# Patient Record
Sex: Female | Born: 1970 | Race: Black or African American | Hispanic: No | Marital: Single | Smoking: Former smoker
Health system: Southern US, Community
[De-identification: ages and names within clinical notes are randomized; demographics above are authoritative.]

## PROBLEM LIST (undated history)

## (undated) ENCOUNTER — Emergency Department (HOSPITAL_COMMUNITY): Admission: EM | Payer: Commercial Managed Care - HMO | Source: Home / Self Care

## (undated) DIAGNOSIS — F419 Anxiety disorder, unspecified: Secondary | ICD-10-CM

## (undated) DIAGNOSIS — D219 Benign neoplasm of connective and other soft tissue, unspecified: Secondary | ICD-10-CM

## (undated) DIAGNOSIS — R51 Headache: Secondary | ICD-10-CM

## (undated) DIAGNOSIS — B20 Human immunodeficiency virus [HIV] disease: Secondary | ICD-10-CM

## (undated) DIAGNOSIS — D649 Anemia, unspecified: Secondary | ICD-10-CM

## (undated) DIAGNOSIS — Z21 Asymptomatic human immunodeficiency virus [HIV] infection status: Secondary | ICD-10-CM

## (undated) DIAGNOSIS — F99 Mental disorder, not otherwise specified: Secondary | ICD-10-CM

## (undated) DIAGNOSIS — B009 Herpesviral infection, unspecified: Secondary | ICD-10-CM

## (undated) DIAGNOSIS — F32A Depression, unspecified: Secondary | ICD-10-CM

## (undated) DIAGNOSIS — B999 Unspecified infectious disease: Secondary | ICD-10-CM

## (undated) DIAGNOSIS — I10 Essential (primary) hypertension: Secondary | ICD-10-CM

## (undated) DIAGNOSIS — E785 Hyperlipidemia, unspecified: Secondary | ICD-10-CM

## (undated) HISTORY — DX: Hyperlipidemia, unspecified: E78.5

## (undated) HISTORY — PX: TUBAL LIGATION: SHX77

## (undated) HISTORY — PX: DILATION AND CURETTAGE OF UTERUS: SHX78

## (undated) HISTORY — DX: Herpesviral infection, unspecified: B00.9

## (undated) HISTORY — DX: Depression, unspecified: F32.A

## (undated) HISTORY — DX: Essential (primary) hypertension: I10

## (undated) HISTORY — PX: HAND TENDON SURGERY: SHX663

## (undated) HISTORY — DX: Mental disorder, not otherwise specified: F99

## (undated) HISTORY — PX: INDUCED ABORTION: SHX677

---

## 2010-03-09 ENCOUNTER — Emergency Department (HOSPITAL_BASED_OUTPATIENT_CLINIC_OR_DEPARTMENT_OTHER): Admission: EM | Admit: 2010-03-09 | Discharge: 2010-03-09 | Payer: Self-pay | Admitting: Emergency Medicine

## 2010-04-25 ENCOUNTER — Emergency Department (HOSPITAL_BASED_OUTPATIENT_CLINIC_OR_DEPARTMENT_OTHER): Admission: EM | Admit: 2010-04-25 | Discharge: 2010-04-25 | Payer: Self-pay | Admitting: Emergency Medicine

## 2010-11-13 LAB — URINALYSIS, ROUTINE W REFLEX MICROSCOPIC
Bilirubin Urine: NEGATIVE
Glucose, UA: NEGATIVE mg/dL
Hgb urine dipstick: NEGATIVE
Ketones, ur: NEGATIVE mg/dL
Nitrite: NEGATIVE
Protein, ur: NEGATIVE mg/dL
Specific Gravity, Urine: 1.031 — ABNORMAL HIGH (ref 1.005–1.030)
Urobilinogen, UA: 1 mg/dL (ref 0.0–1.0)
pH: 6 (ref 5.0–8.0)

## 2010-11-13 LAB — PREGNANCY, URINE: Preg Test, Ur: NEGATIVE

## 2013-08-14 ENCOUNTER — Encounter (HOSPITAL_COMMUNITY): Payer: Self-pay | Admitting: Emergency Medicine

## 2013-08-14 ENCOUNTER — Emergency Department (HOSPITAL_COMMUNITY)
Admission: EM | Admit: 2013-08-14 | Discharge: 2013-08-14 | Disposition: A | Discharge status: Home / Self Care | Payer: Medicaid - Out of State | Attending: Emergency Medicine | Admitting: Emergency Medicine

## 2013-08-14 DIAGNOSIS — R109 Unspecified abdominal pain: Secondary | ICD-10-CM | POA: Insufficient documentation

## 2013-08-14 DIAGNOSIS — Z862 Personal history of diseases of the blood and blood-forming organs and certain disorders involving the immune mechanism: Secondary | ICD-10-CM | POA: Insufficient documentation

## 2013-08-14 DIAGNOSIS — Z79899 Other long term (current) drug therapy: Secondary | ICD-10-CM | POA: Insufficient documentation

## 2013-08-14 DIAGNOSIS — R110 Nausea: Secondary | ICD-10-CM | POA: Insufficient documentation

## 2013-08-14 DIAGNOSIS — Z3202 Encounter for pregnancy test, result negative: Secondary | ICD-10-CM | POA: Insufficient documentation

## 2013-08-14 DIAGNOSIS — R142 Eructation: Secondary | ICD-10-CM | POA: Insufficient documentation

## 2013-08-14 DIAGNOSIS — Z21 Asymptomatic human immunodeficiency virus [HIV] infection status: Secondary | ICD-10-CM | POA: Insufficient documentation

## 2013-08-14 DIAGNOSIS — R141 Gas pain: Secondary | ICD-10-CM | POA: Insufficient documentation

## 2013-08-14 DIAGNOSIS — R197 Diarrhea, unspecified: Secondary | ICD-10-CM | POA: Insufficient documentation

## 2013-08-14 DIAGNOSIS — R5381 Other malaise: Secondary | ICD-10-CM | POA: Insufficient documentation

## 2013-08-14 HISTORY — DX: Anemia, unspecified: D64.9

## 2013-08-14 HISTORY — DX: Human immunodeficiency virus (HIV) disease: B20

## 2013-08-14 HISTORY — DX: Asymptomatic human immunodeficiency virus (hiv) infection status: Z21

## 2013-08-14 LAB — COMPREHENSIVE METABOLIC PANEL
ALT: 8 U/L (ref 0–35)
AST: 17 U/L (ref 0–37)
Albumin: 4.1 g/dL (ref 3.5–5.2)
Alkaline Phosphatase: 60 U/L (ref 39–117)
BUN: 11 mg/dL (ref 6–23)
CO2: 27 mEq/L (ref 19–32)
Calcium: 9.9 mg/dL (ref 8.4–10.5)
Chloride: 103 mEq/L (ref 96–112)
Creatinine, Ser: 1 mg/dL (ref 0.50–1.10)
GFR calc Af Amer: 81 mL/min — ABNORMAL LOW (ref >90)
GFR calc non Af Amer: 69 mL/min — ABNORMAL LOW (ref >90)
Glucose, Bld: 72 mg/dL (ref 70–99)
Potassium: 3.8 mEq/L (ref 3.5–5.1)
Sodium: 138 mEq/L (ref 135–145)
Total Bilirubin: 0.3 mg/dL (ref 0.3–1.2)
Total Protein: 9.1 g/dL — ABNORMAL HIGH (ref 6.0–8.3)

## 2013-08-14 LAB — POCT PREGNANCY, URINE: Preg Test, Ur: NEGATIVE

## 2013-08-14 LAB — CBC WITH DIFFERENTIAL/PLATELET
Basophils Absolute: 0 10*3/uL (ref 0.0–0.1)
Basophils Relative: 0 % (ref 0–1)
Eosinophils Absolute: 0 10*3/uL (ref 0.0–0.7)
Eosinophils Relative: 1 % (ref 0–5)
HCT: 33.9 % — ABNORMAL LOW (ref 36.0–46.0)
Hemoglobin: 10.7 g/dL — ABNORMAL LOW (ref 12.0–15.0)
Lymphocytes Relative: 51 % — ABNORMAL HIGH (ref 12–46)
Lymphs Abs: 1.5 10*3/uL (ref 0.7–4.0)
MCH: 23.3 pg — ABNORMAL LOW (ref 26.0–34.0)
MCHC: 31.6 g/dL (ref 30.0–36.0)
MCV: 73.7 fL — ABNORMAL LOW (ref 78.0–100.0)
Monocytes Absolute: 0.4 10*3/uL (ref 0.1–1.0)
Monocytes Relative: 13 % — ABNORMAL HIGH (ref 3–12)
Neutro Abs: 1.1 10*3/uL — ABNORMAL LOW (ref 1.7–7.7)
Neutrophils Relative %: 35 % — ABNORMAL LOW (ref 43–77)
Platelets: 318 10*3/uL (ref 150–400)
RBC: 4.6 MIL/uL (ref 3.87–5.11)
RDW: 21.3 % — ABNORMAL HIGH (ref 11.5–15.5)
WBC: 3 10*3/uL — ABNORMAL LOW (ref 4.0–10.5)

## 2013-08-14 LAB — URINALYSIS, ROUTINE W REFLEX MICROSCOPIC
Bilirubin Urine: NEGATIVE
Glucose, UA: NEGATIVE mg/dL
Ketones, ur: NEGATIVE mg/dL
Leukocytes, UA: NEGATIVE
Nitrite: NEGATIVE
Protein, ur: NEGATIVE mg/dL
Specific Gravity, Urine: 1.019 (ref 1.005–1.030)
Urobilinogen, UA: 0.2 mg/dL (ref 0.0–1.0)
pH: 7 (ref 5.0–8.0)

## 2013-08-14 LAB — URINE MICROSCOPIC-ADD ON

## 2013-08-14 LAB — LIPASE, BLOOD: Lipase: 59 U/L (ref 11–59)

## 2013-08-14 MED ORDER — ONDANSETRON 8 MG PO TBDP: 8.0000 mg | ORAL_TABLET | Freq: Once | ORAL | Status: DC

## 2013-08-14 MED ORDER — ONDANSETRON 8 MG PO TBDP
8.0000 mg | ORAL_TABLET | Freq: Once | ORAL | Status: AC
  Administered 2013-08-14: 8 mg via ORAL
  Filled 2013-08-14: qty 1

## 2013-08-14 MED ORDER — PROMETHAZINE HCL 25 MG PO TABS: 25.0000 mg | ORAL_TABLET | Freq: Four times a day (QID) | ORAL | Status: DC | PRN

## 2013-08-14 MED ORDER — METRONIDAZOLE 500 MG PO TABS: 500.0000 mg | ORAL_TABLET | Freq: Two times a day (BID) | ORAL | Status: DC

## 2013-08-14 NOTE — ED Notes (Signed)
Pt states she also abd pain with nausea, no emesis

## 2013-08-14 NOTE — ED Notes (Signed)
Per EMS. Pt complains of generalized weakness. Hx of anemia requiring blood transfusion, pt states it feels like same. Also has hx of HIV

## 2013-08-14 NOTE — ED Provider Notes (Addendum)
CSN: [id number]     Arrival date & time 08/14/13  1834 History   First MD Initiated Contact with Patient 08/14/13 2117     Chief Complaint  Patient presents with  . Fatigue  . Abdominal Pain   HPI Patient reports crampy abdominal pain and bloating sensation over the past several hours.  She reports nausea over the past 12 hours with diarrhea yesterday.  She denies melena or hematochezia.  No vomiting.  She states she's compliant with her medications including her HIV medications.  She recently relocated from out of state to [place].  She came in on the bus earlier today.  She states her crampy abdominal pain began while she was on the bus.   Past Medical History  Diagnosis Date  . Anemia   . HIV (human immunodeficiency virus infection)    History reviewed. No pertinent past surgical history. History reviewed. No pertinent family history. History  Substance Use Topics  . Smoking status: Never Smoker   . Smokeless tobacco: Not on file  . Alcohol Use: No   OB History   Grav Para Term Preterm Abortions TAB SAB Ect Mult Living                 Review of Systems  All other systems reviewed and are negative.    Allergies  Review of patient's allergies indicates no known allergies.  Home Medications   Current Outpatient Rx  Name  Route  Sig  Dispense  Refill  . darunavir (PREZISTA) 600 MG tablet   Oral   Take 600 mg by mouth daily with breakfast.         . emtricitabine-tenofovir (TRUVADA) 200-300 MG per tablet   Oral   Take 1 tablet by mouth daily.         . ritonavir (NORVIR) 100 MG capsule   Oral   Take 100 mg by mouth daily with breakfast.          BP 117/83  Pulse 86  Temp(Src) 98.9 F (37.2 C) (Oral)  Resp 20  SpO2 100% Physical Exam  Nursing note and vitals reviewed. Constitutional: She is oriented to person, place, and time. She appears well-developed and well-nourished. No distress.  HENT:  Head: Normocephalic and atraumatic.  Eyes: EOM are  normal.  Neck: Normal range of motion.  Cardiovascular: Normal rate, regular rhythm and normal heart sounds.   Pulmonary/Chest: Effort normal and breath sounds normal.  Abdominal: Soft. She exhibits no distension. There is no tenderness.  Musculoskeletal: Normal range of motion.  Neurological: She is alert and oriented to person, place, and time.  Skin: Skin is warm and dry.  Psychiatric: She has a normal mood and affect. Judgment normal.    ED Course  Procedures (including critical care time) Labs Review Labs Reviewed  URINALYSIS, ROUTINE W REFLEX MICROSCOPIC - Abnormal; Notable for the following:    APPearance CLOUDY (*)    Hgb urine dipstick MODERATE (*)    All other components within normal limits  CBC WITH DIFFERENTIAL - Abnormal; Notable for the following:    WBC 3.0 (*)    Hemoglobin 10.7 (*)    HCT 33.9 (*)    MCV 73.7 (*)    MCH 23.3 (*)    RDW 21.3 (*)    Neutrophils Relative % 35 (*)    Lymphocytes Relative 51 (*)    Monocytes Relative 13 (*)    Neutro Abs 1.1 (*)    All other components within normal limits  COMPREHENSIVE METABOLIC PANEL - Abnormal; Notable for the following:    Total Protein 9.1 (*)    GFR calc non Af Amer 69 (*)    GFR calc Af Amer 81 (*)    All other components within normal limits  LIPASE, BLOOD  URINE MICROSCOPIC-ADD ON  POCT PREGNANCY, URINE   Imaging Review No results found.  EKG Interpretation   None       MDM   1. Abdominal pain   2. Nausea   3. Diarrhea    10:13 PM Patient feels much better after oral Zofran.  He oral fluids down.  Discharged in good condition.  Outpatient followup.  Referral to local resources    Lyanne Co, MD 08/14/13 2214  10:37 PM Patient states she was supposed to be treated for vaginitis 4 days ago.  She states she no longer has her prescription for antibiotics.  The patient will be prescribed Flagyl  Lyanne Co, MD 08/14/13 2238

## 2013-08-30 ENCOUNTER — Encounter (HOSPITAL_COMMUNITY): Payer: Self-pay | Admitting: Emergency Medicine

## 2013-08-30 DIAGNOSIS — R5381 Other malaise: Secondary | ICD-10-CM | POA: Insufficient documentation

## 2013-08-30 DIAGNOSIS — D649 Anemia, unspecified: Secondary | ICD-10-CM | POA: Insufficient documentation

## 2013-08-30 DIAGNOSIS — Z79899 Other long term (current) drug therapy: Secondary | ICD-10-CM | POA: Insufficient documentation

## 2013-08-30 DIAGNOSIS — Z5900 Homelessness unspecified: Secondary | ICD-10-CM | POA: Insufficient documentation

## 2013-08-30 DIAGNOSIS — Z3202 Encounter for pregnancy test, result negative: Secondary | ICD-10-CM | POA: Insufficient documentation

## 2013-08-30 DIAGNOSIS — R5383 Other fatigue: Secondary | ICD-10-CM

## 2013-08-30 DIAGNOSIS — R112 Nausea with vomiting, unspecified: Secondary | ICD-10-CM | POA: Insufficient documentation

## 2013-08-30 DIAGNOSIS — Z21 Asymptomatic human immunodeficiency virus [HIV] infection status: Secondary | ICD-10-CM | POA: Insufficient documentation

## 2013-08-30 DIAGNOSIS — N898 Other specified noninflammatory disorders of vagina: Secondary | ICD-10-CM | POA: Insufficient documentation

## 2013-08-30 NOTE — ED Notes (Signed)
Patient with history of 5-6 months of bleeding from fibroids.  Patient states she is also having some shortness of breath with congestion.  Patient states she just moved to the area and doesn't have regular doctors.

## 2013-08-31 ENCOUNTER — Emergency Department (HOSPITAL_COMMUNITY)
Admission: EM | Admit: 2013-08-31 | Discharge: 2013-08-31 | Disposition: A | Discharge status: Home / Self Care | Payer: Medicaid Other | Attending: Emergency Medicine | Admitting: Emergency Medicine

## 2013-08-31 DIAGNOSIS — N939 Abnormal uterine and vaginal bleeding, unspecified: Secondary | ICD-10-CM

## 2013-08-31 DIAGNOSIS — R5383 Other fatigue: Secondary | ICD-10-CM

## 2013-08-31 LAB — URINALYSIS, ROUTINE W REFLEX MICROSCOPIC
Bilirubin Urine: NEGATIVE
Glucose, UA: NEGATIVE mg/dL
Hgb urine dipstick: NEGATIVE
Ketones, ur: NEGATIVE mg/dL
Leukocytes, UA: NEGATIVE
Nitrite: NEGATIVE
Protein, ur: NEGATIVE mg/dL
Specific Gravity, Urine: 1.026 (ref 1.005–1.030)
Urobilinogen, UA: 0.2 mg/dL (ref 0.0–1.0)
pH: 7 (ref 5.0–8.0)

## 2013-08-31 LAB — CBC WITH DIFFERENTIAL/PLATELET
Basophils Absolute: 0 10*3/uL (ref 0.0–0.1)
Basophils Relative: 1 % (ref 0–1)
Eosinophils Absolute: 0.1 10*3/uL (ref 0.0–0.7)
Eosinophils Relative: 3 % (ref 0–5)
HCT: 31.3 % — ABNORMAL LOW (ref 36.0–46.0)
Hemoglobin: 10.3 g/dL — ABNORMAL LOW (ref 12.0–15.0)
Lymphocytes Relative: 53 % — ABNORMAL HIGH (ref 12–46)
Lymphs Abs: 1.5 10*3/uL (ref 0.7–4.0)
MCH: 24.5 pg — ABNORMAL LOW (ref 26.0–34.0)
MCHC: 32.9 g/dL (ref 30.0–36.0)
MCV: 74.5 fL — ABNORMAL LOW (ref 78.0–100.0)
Monocytes Absolute: 0.4 10*3/uL (ref 0.1–1.0)
Monocytes Relative: 13 % — ABNORMAL HIGH (ref 3–12)
Neutro Abs: 0.8 10*3/uL — ABNORMAL LOW (ref 1.7–7.7)
Neutrophils Relative %: 30 % — ABNORMAL LOW (ref 43–77)
Platelets: 254 10*3/uL (ref 150–400)
RBC: 4.2 MIL/uL (ref 3.87–5.11)
RDW: 21.4 % — ABNORMAL HIGH (ref 11.5–15.5)
WBC: 2.8 10*3/uL — ABNORMAL LOW (ref 4.0–10.5)

## 2013-08-31 LAB — COMPREHENSIVE METABOLIC PANEL
ALT: 9 U/L (ref 0–35)
AST: 14 U/L (ref 0–37)
Albumin: 3.4 g/dL — ABNORMAL LOW (ref 3.5–5.2)
Alkaline Phosphatase: 53 U/L (ref 39–117)
BUN: 15 mg/dL (ref 6–23)
CO2: 23 mEq/L (ref 19–32)
Calcium: 9 mg/dL (ref 8.4–10.5)
Chloride: 103 mEq/L (ref 96–112)
Creatinine, Ser: 0.83 mg/dL (ref 0.50–1.10)
GFR calc Af Amer: >90 mL/min (ref >90)
GFR calc non Af Amer: 87 mL/min — ABNORMAL LOW (ref >90)
Glucose, Bld: 85 mg/dL (ref 70–99)
Potassium: 3.7 mEq/L (ref 3.7–5.3)
Sodium: 138 mEq/L (ref 137–147)
Total Bilirubin: <0.2 mg/dL — ABNORMAL LOW (ref 0.3–1.2)
Total Protein: 7.9 g/dL (ref 6.0–8.3)

## 2013-08-31 LAB — PREGNANCY, URINE: Preg Test, Ur: NEGATIVE

## 2013-08-31 MED ORDER — METRONIDAZOLE 500 MG PO TABS: 500.0000 mg | ORAL_TABLET | Freq: Two times a day (BID) | ORAL | Status: DC

## 2013-08-31 NOTE — ED Provider Notes (Signed)
CSN: [id number]     Arrival date & time 08/30/13  2109 History   First MD Initiated Contact with Patient 08/31/13 0230     Chief Complaint  Patient presents with  . Vaginal Bleeding   (Consider location/radiation/quality/duration/timing/severity/associated sxs/prior Treatment) HPI Patient has had 5-6 months of intermittent vaginal spotting. She states she presents tonight because she was tired of the spotting. She's had no recent bleeding. She is currently not bleeding in the emergency department. She complains of generalized fatigue. She's had intermittent nausea and vomiting. Denies any urinary symptoms. 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 She is currently having no abdominal pain. She is resting comfortably. Past Medical History  Diagnosis Date  . Anemia   . HIV (human immunodeficiency virus infection)    History reviewed. No pertinent past surgical history. No family history on file. History  Substance Use Topics  . Smoking status: Never Smoker   . Smokeless tobacco: Not on file  . Alcohol Use: No   OB History   Grav Para Term Preterm Abortions TAB SAB Ect Mult Living                 Review of Systems  Constitutional: Positive for fatigue. Negative for fever and chills.  Respiratory: Negative for cough and shortness of breath.   Cardiovascular: Negative for chest pain, palpitations and leg swelling.  Gastrointestinal: Positive for nausea and vomiting. Negative for abdominal pain, diarrhea and constipation.  Genitourinary: Positive for vaginal bleeding. Negative for dysuria, flank pain, vaginal discharge, vaginal pain and pelvic pain.  Musculoskeletal: Negative for back pain, neck pain and neck stiffness.  Skin: Negative for rash and wound.  Neurological: Negative for dizziness, weakness, light-headedness, numbness and headaches.  All other systems reviewed and are negative.    Allergies  Tomato  Home Medications   Current  Outpatient Rx  Name  Route  Sig  Dispense  Refill  . albuterol (PROVENTIL HFA;VENTOLIN HFA) 108 (90 BASE) MCG/ACT inhaler   Inhalation   Inhale 2 puffs into the lungs 3 (three) times daily as needed for wheezing or shortness of breath.         . darunavir (PREZISTA) 600 MG tablet   Oral   Take 600 mg by mouth daily.          Mariane Baumgarten Calcium (STOOL SOFTENER PO)   Oral   Take 1 capsule by mouth daily as needed (constipation).         Marland Kitchen emtricitabine-tenofovir (TRUVADA) 200-300 MG per tablet   Oral   Take 1 tablet by mouth daily.         . ferrous sulfate 325 (65 FE) MG EC tablet   Oral   Take 325 mg by mouth daily.         . MedroxyPROGESTERone Acetate (DEPO-PROVERA IM)   Intramuscular   Inject 1 application into the muscle every 3 (three) months. Last injection October 2014         . Multiple Vitamin (MULTIVITAMIN WITH MINERALS) TABS tablet   Oral   Take 1 tablet by mouth daily.         . ritonavir (NORVIR) 100 MG capsule   Oral   Take 100 mg by mouth daily.           BP 120/82  Pulse 80  Temp(Src) 98.9 F (37.2 C) (Oral)  Resp 16  Wt 141 lb 11.2 oz (64.275 kg)  SpO2 98% Physical Exam  Nursing note  and vitals reviewed. Constitutional: She is oriented to person, place, and time. She appears well-developed and well-nourished. No distress.  HENT:  Head: Normocephalic and atraumatic.  Mouth/Throat: Oropharynx is clear and moist.  Eyes: EOM are normal. Pupils are equal, round, and reactive to light.  Neck: Normal range of motion. Neck supple.  Cardiovascular: Normal rate and regular rhythm.   Pulmonary/Chest: Effort normal and breath sounds normal. No respiratory distress. She has no wheezes. She has no rales. She exhibits no tenderness.  Abdominal: Soft. Bowel sounds are normal. She exhibits no distension and no mass. There is no tenderness. There is no rebound and no guarding.  Musculoskeletal: Normal range of motion. She exhibits no edema and no  tenderness.  No CVA tenderness bilaterally  Neurological: She is alert and oriented to person, place, and time.  Moves all extremities without deficit. Sensation grossly intact.  Skin: Skin is warm and dry. No rash noted. No erythema.  Psychiatric: She has a normal mood and affect. Her behavior is normal.    ED Course  Procedures (including critical care time) Labs Review Labs Reviewed  CBC WITH DIFFERENTIAL - Abnormal; Notable for the following:    WBC 2.8 (*)    Hemoglobin 10.3 (*)    HCT 31.3 (*)    MCV 74.5 (*)    MCH 24.5 (*)    RDW 21.4 (*)    Neutrophils Relative % 30 (*)    Lymphocytes Relative 53 (*)    Monocytes Relative 13 (*)    Neutro Abs 0.8 (*)    All other components within normal limits  COMPREHENSIVE METABOLIC PANEL - Abnormal; Notable for the following:    Albumin 3.4 (*)    Total Bilirubin <0.2 (*)    GFR calc non Af Amer 87 (*)    All other components within normal limits  URINALYSIS, ROUTINE W REFLEX MICROSCOPIC  PREGNANCY, URINE   Imaging Review No results found.  EKG Interpretation   None       MDM   1. Vaginal spotting   2. Fatigue    Patient advised to followup with OB/GYN clinic. She's hemodynamically stable. She is asking for a prescription for Flagyl since she was recently diagnosed with mitral vaginosis and did not get the prescription filled.    Julianne Rice, MD 08/31/13 [phone]

## 2013-08-31 NOTE — Discharge Instructions (Signed)
Abnormal Vaginal Bleeding Abnormal vaginal bleeding means bleeding from the vagina that is not your normal menstrual period. Bleeding may be heavy or light. It may last for days or come and go. There are many problems that may cause this. HOME CARE  Keep track of your periods on a calendar if they are not regular.  Write down:  How often pads or tampons are changed.  The size and number of clots, if there are any.  A change in the color of the blood.  A change in the amount of blood.  Any smell.  The time and strength of cramps or pain.  Limit activity as told.  Eat a healthy diet.  Do not have sex (intercourse) until your doctor says it is okay.  Never have unprotected sex unless you are trying to get pregnant.  Only take medicine as told by your doctor. GET HELP RIGHT AWAY IF:   You get dizzy or feel faint when standing up.  You have to change pads or tampons more than once an hour.  You feel a sudden change in your pain.  You start bleeding heavily.  You develop a fever. MAKE SURE YOU:  Understand these instructions.  Will watch your condition.  Will get help right away if you are not doing well or get worse. Document Released: 06/11/2009 Document Revised: 11/06/2011 Document Reviewed: 03/13/2013 [facility] Patient Information 2014 [place], [place].

## 2013-09-01 ENCOUNTER — Encounter (HOSPITAL_COMMUNITY): Payer: Self-pay | Admitting: *Deleted

## 2013-09-01 ENCOUNTER — Telehealth: Payer: Self-pay | Admitting: Obstetrics and Gynecology

## 2013-09-01 ENCOUNTER — Inpatient Hospital Stay (HOSPITAL_COMMUNITY)
Admission: AD | Admit: 2013-09-01 | Discharge: 2013-09-01 | Discharge status: Against Medical Advice | Payer: Medicaid Other | Source: Ambulatory Visit | Attending: Obstetrics & Gynecology | Admitting: Obstetrics & Gynecology

## 2013-09-01 DIAGNOSIS — Z21 Asymptomatic human immunodeficiency virus [HIV] infection status: Secondary | ICD-10-CM | POA: Insufficient documentation

## 2013-09-01 DIAGNOSIS — N939 Abnormal uterine and vaginal bleeding, unspecified: Secondary | ICD-10-CM

## 2013-09-01 DIAGNOSIS — N938 Other specified abnormal uterine and vaginal bleeding: Secondary | ICD-10-CM | POA: Insufficient documentation

## 2013-09-01 DIAGNOSIS — D649 Anemia, unspecified: Secondary | ICD-10-CM

## 2013-09-01 DIAGNOSIS — R112 Nausea with vomiting, unspecified: Secondary | ICD-10-CM

## 2013-09-01 DIAGNOSIS — N949 Unspecified condition associated with female genital organs and menstrual cycle: Secondary | ICD-10-CM | POA: Insufficient documentation

## 2013-09-01 DIAGNOSIS — N925 Other specified irregular menstruation: Secondary | ICD-10-CM | POA: Insufficient documentation

## 2013-09-01 DIAGNOSIS — R42 Dizziness and giddiness: Secondary | ICD-10-CM | POA: Insufficient documentation

## 2013-09-01 DIAGNOSIS — R109 Unspecified abdominal pain: Secondary | ICD-10-CM | POA: Insufficient documentation

## 2013-09-01 DIAGNOSIS — N898 Other specified noninflammatory disorders of vagina: Secondary | ICD-10-CM

## 2013-09-01 HISTORY — DX: Anxiety disorder, unspecified: F41.9

## 2013-09-01 HISTORY — DX: Headache: R51

## 2013-09-01 HISTORY — DX: Benign neoplasm of connective and other soft tissue, unspecified: D21.9

## 2013-09-01 HISTORY — DX: Unspecified infectious disease: B99.9

## 2013-09-01 LAB — URINALYSIS, ROUTINE W REFLEX MICROSCOPIC
Bilirubin Urine: NEGATIVE
Glucose, UA: NEGATIVE mg/dL
Ketones, ur: NEGATIVE mg/dL
Leukocytes, UA: NEGATIVE
Nitrite: NEGATIVE
Protein, ur: NEGATIVE mg/dL
Specific Gravity, Urine: 1.02 (ref 1.005–1.030)
Urobilinogen, UA: 0.2 mg/dL (ref 0.0–1.0)
pH: 6 (ref 5.0–8.0)

## 2013-09-01 LAB — CBC
HCT: 32.6 % — ABNORMAL LOW (ref 36.0–46.0)
Hemoglobin: 10.5 g/dL — ABNORMAL LOW (ref 12.0–15.0)
MCH: 24 pg — ABNORMAL LOW (ref 26.0–34.0)
MCHC: 32.2 g/dL (ref 30.0–36.0)
MCV: 74.4 fL — ABNORMAL LOW (ref 78.0–100.0)
Platelets: 258 10*3/uL (ref 150–400)
RBC: 4.38 MIL/uL (ref 3.87–5.11)
RDW: 21.5 % — ABNORMAL HIGH (ref 11.5–15.5)
WBC: 2.5 10*3/uL — ABNORMAL LOW (ref 4.0–10.5)

## 2013-09-01 LAB — WET PREP, GENITAL
Trich, Wet Prep: NONE SEEN
Yeast Wet Prep HPF POC: NONE SEEN

## 2013-09-01 LAB — URINE MICROSCOPIC-ADD ON

## 2013-09-01 MED ORDER — ONDANSETRON 4 MG PO TBDP: 4.0000 mg | ORAL_TABLET | Freq: Once | ORAL | Status: DC

## 2013-09-01 NOTE — MAU Provider Note (Signed)
History     CSN: [id number]  Arrival date and time: 09/01/13 1256   First Provider Initiated Contact with Patient 09/01/13 1457      Chief Complaint  Patient presents with  . Emesis  . Abdominal Pain  . Fatigue  . Vaginal Discharge   HPI  Dawn [name] is a 71 yof, [id number] who presents to the MAU for dizziness x 2 weeks.  Pt states that she feels "foggy" and sometimes needs to support her weight by holding onto the wall because she feels like she might "pass out."  Pt reports that she feels like she has been "burning up," but has not taken her temperature.  She also endorses N/V/D, abdominal cramps, malaise, dysuria and vaginal bleeding.  She describes the vaginal bleeding as dark with a foul smell that is controlled with 2 panty liners/day.  Patient denies any blood in her vomit and/or diarrhea, chest pain, dyspnea.  She endorses being HIV positive, but is unaware of her last T-cell count.  Pt states she has been taking her antivirals for her HIV infection that started about 5 months ago and feels that these symptoms may be attributed to those medications.    The patient was seen in the ED yesterday 08/31/2013, see note for further detail.     OB History   Grav Para Term Preterm Abortions TAB SAB Ect Mult Living   6 4 4  2 1 1   4       Past Medical History  Diagnosis Date  . Anemia   . HIV (human immunodeficiency virus infection)   . Headache(784.0)   . Anxiety   . Infection     UTI  . Fibroid     Past Surgical History  Procedure Laterality Date  . Dilation and curettage of uterus    . Induced abortion      Family History  Problem Relation Age of Onset  . Family history unknown: Yes    History  Substance Use Topics  . Smoking status: Former Research scientist (life sciences)  . Smokeless tobacco: Never Used     Comment: quit 8 yrs  . Alcohol Use: No     Comment: hx of alcohol abuse- none in 8 yrs    Allergies:  Allergies  Allergen Reactions  . Chocolate Hives  . Tomato Hives     Prescriptions prior to admission  Medication Sig Dispense Refill  . darunavir (PREZISTA) 600 MG tablet Take 600 mg by mouth daily.       Marland Kitchen emtricitabine-tenofovir (TRUVADA) 200-300 MG per tablet Take 1 tablet by mouth daily.      . ferrous sulfate 325 (65 FE) MG EC tablet Take 325 mg by mouth daily.      . metroNIDAZOLE (FLAGYL) 500 MG tablet Take 1 tablet (500 mg total) by mouth 2 (two) times daily. One po bid x 7 days  14 tablet  0  . Multiple Vitamin (MULTIVITAMIN WITH MINERALS) TABS tablet Take 1 tablet by mouth daily.      . ritonavir (NORVIR) 100 MG capsule Take 100 mg by mouth daily.       Marland Kitchen albuterol (PROVENTIL HFA;VENTOLIN HFA) 108 (90 BASE) MCG/ACT inhaler Inhale 2 puffs into the lungs 3 (three) times daily as needed for wheezing or shortness of breath.      . MedroxyPROGESTERone Acetate (DEPO-PROVERA IM) Inject 1 application into the muscle every 3 (three) months. Last injection October 2014       Results for orders placed during the hospital  encounter of 09/01/13 (from the past 48 hour(s))  URINALYSIS, ROUTINE W REFLEX MICROSCOPIC     Status: Abnormal   Collection Time    09/01/13  2:13 PM      Result Value Range   Color, Urine YELLOW  YELLOW   APPearance CLEAR  CLEAR   Specific Gravity, Urine 1.020  1.005 - 1.030   pH 6.0  5.0 - 8.0   Glucose, UA NEGATIVE  NEGATIVE mg/dL   Hgb urine dipstick TRACE (*) NEGATIVE   Bilirubin Urine NEGATIVE  NEGATIVE   Ketones, ur NEGATIVE  NEGATIVE mg/dL   Protein, ur NEGATIVE  NEGATIVE mg/dL   Urobilinogen, UA 0.2  0.0 - 1.0 mg/dL   Nitrite NEGATIVE  NEGATIVE   Leukocytes, UA NEGATIVE  NEGATIVE  URINE MICROSCOPIC-ADD ON     Status: None   Collection Time    09/01/13  2:13 PM      Result Value Range   Squamous Epithelial / LPF RARE  RARE   WBC, UA 0-2  <3 WBC/hpf   RBC / HPF 0-2  <3 RBC/hpf   Bacteria, UA RARE  RARE   Urine-Other MUCOUS PRESENT    CBC     Status: Abnormal   Collection Time    09/01/13  3:41 PM      Result Value  Range   WBC 2.5 (*) 4.0 - 10.5 K/uL   RBC 4.38  3.87 - 5.11 MIL/uL   Hemoglobin 10.5 (*) 12.0 - 15.0 g/dL   HCT 32.6 (*) 36.0 - 46.0 %   MCV 74.4 (*) 78.0 - 100.0 fL   MCH 24.0 (*) 26.0 - 34.0 pg   MCHC 32.2  30.0 - 36.0 g/dL   RDW 21.5 (*) 11.5 - 15.5 %   Platelets 258  150 - 400 K/uL  WET PREP, GENITAL     Status: Abnormal   Collection Time    09/01/13  4:03 PM      Result Value Range   Yeast Wet Prep HPF POC NONE SEEN  NONE SEEN   Trich, Wet Prep NONE SEEN  NONE SEEN   Clue Cells Wet Prep HPF POC FEW (*) NONE SEEN   WBC, Wet Prep HPF POC FEW (*) NONE SEEN   Comment: MANY BACTERIA SEEN    Review of Systems  Constitutional: Positive for malaise/fatigue. Negative for fever and chills.  Respiratory: Negative for cough, hemoptysis and shortness of breath.   Cardiovascular: Negative for chest pain and palpitations.  Gastrointestinal: Positive for nausea, vomiting and diarrhea. Negative for blood in stool.  Genitourinary: Positive for dysuria and urgency. Negative for hematuria.  Musculoskeletal: Negative for falls.  Neurological: Negative for loss of consciousness.  Psychiatric/Behavioral: Positive for depression. Negative for suicidal ideas and substance abuse.   Physical Exam   Blood pressure 104/89, pulse 73, temperature 97.9 F (36.6 C), resp. rate 16, height 5\' 5"  (1.651 m), weight 64.139 kg (141 lb 6.4 oz), last menstrual period 08/13/2013, SpO2 100.00%.  Physical Exam  Constitutional: She is oriented to person, place, and time. She appears well-developed and well-nourished.  HENT:  Head: Normocephalic.  Eyes: Pupils are equal, round, and reactive to light.  Neck: Normal range of motion.  Respiratory: Effort normal.  GI: Soft. Bowel sounds are normal. She exhibits no distension and no mass. There is no tenderness. There is no rebound and no guarding.  Genitourinary:  Speculum exam: Pt did not tolerate speculum exam; would not allow for me to complete full exam. Unable  to  visualize cervix or vaginal wall.  Bimanual exam: Cervix closed, no CMT  Uterus non tender, normal size Adnexa non tender, no masses bilaterally GC/Chlam, wet prep done Chaperone present for exam.   Musculoskeletal: Normal range of motion.  Neurological: She is alert and oriented to person, place, and time.  Skin: Skin is warm.  Psychiatric: Her mood appears anxious.    MAU Course  Procedures None  MDM  -The source of vaginal bleeding that has been intermittent for the past several months is likely to be related to her history of fibroids or infectious in origin.  Although the patient refused the complete pelvic exam, the amount of bleeding noted was minimal.  With the absence of abdominal pain, a pelvic US would not be warranted.  A wet prep and GC was collected without use of a speculum.  Wet prep revealed no growth for yeast and trich, but positive for a few clue cells and many WBC's.  Due to a few clue cells, no odor noted and the patients history of non adherence, treatment for bacterial vaginosis will not be given.  -Patient refused orthostatic vital signs.  Unable to rule out orthostatic hypotension as a possible cause for her dizziness.   Assessment and Plan   Assessment:  1. Abnormal vaginal bleeding; stable condition   2. Nausea & vomiting   3.   Anemia  Plan:  -Discharged in stable condition -Patient counseled on the contingency for a positive GC -Encouraged iron supplements -Instruct pt to f/u in the gyn clinic; referral made  -Instruct pt to come back to the MAU if symptoms worsen  *Patient left prior to discharge instructions being given    Evaluation and management procedures were performed by the PA student under my supervision and collaboration. I have reviewed the note and chart, and I agree with the management and plan. Darrelyn Hillock Rasch, NP  09/01/2013, 7:03 PM

## 2013-09-01 NOTE — MAU Note (Addendum)
Has not been feeling good for several months.  Some confusion when asked where she was yesterday - where she had testing.  Said she had been in [facility] and then [place], now at a shelter. Pt had put unsure regarding preg, informed her she was tested 2 wks ago and again yesterday- tests were negative, she is NOT preg.  Feels weak, ? Dehydrated.

## 2013-09-01 NOTE — MAU Note (Signed)
Patient states she has had vomiting for a few days. Feels tired and weak with periods of feeling like she is going to pass out. Abdominal pain off and on and spotting off and on. Has a history of fibroids. Lives in a shelter.

## 2013-09-01 NOTE — Discharge Instructions (Signed)

## 2013-09-02 LAB — GC/CHLAMYDIA PROBE AMP
CT Probe RNA: NEGATIVE
GC Probe RNA: NEGATIVE

## 2013-09-11 ENCOUNTER — Encounter: Payer: Self-pay | Admitting: Family Medicine

## 2013-10-16 ENCOUNTER — Encounter: Payer: Medicaid - Out of State | Admitting: Medical

## 2014-06-29 ENCOUNTER — Encounter (HOSPITAL_COMMUNITY): Payer: Self-pay | Admitting: *Deleted

## 2015-05-30 ENCOUNTER — Emergency Department (HOSPITAL_COMMUNITY)
Admission: EM | Admit: 2015-05-30 | Discharge: 2015-05-30 | Disposition: A | Discharge status: Home / Self Care | Payer: Medicaid Other | Attending: Emergency Medicine | Admitting: Emergency Medicine

## 2015-05-30 ENCOUNTER — Encounter (HOSPITAL_COMMUNITY): Payer: Self-pay | Admitting: Nurse Practitioner

## 2015-05-30 DIAGNOSIS — D649 Anemia, unspecified: Secondary | ICD-10-CM | POA: Diagnosis not present

## 2015-05-30 DIAGNOSIS — Z21 Asymptomatic human immunodeficiency virus [HIV] infection status: Secondary | ICD-10-CM | POA: Diagnosis not present

## 2015-05-30 DIAGNOSIS — Y998 Other external cause status: Secondary | ICD-10-CM | POA: Insufficient documentation

## 2015-05-30 DIAGNOSIS — W57XXXA Bitten or stung by nonvenomous insect and other nonvenomous arthropods, initial encounter: Secondary | ICD-10-CM | POA: Diagnosis not present

## 2015-05-30 DIAGNOSIS — S20469A Insect bite (nonvenomous) of unspecified back wall of thorax, initial encounter: Secondary | ICD-10-CM | POA: Insufficient documentation

## 2015-05-30 DIAGNOSIS — F419 Anxiety disorder, unspecified: Secondary | ICD-10-CM | POA: Diagnosis not present

## 2015-05-30 DIAGNOSIS — Z87440 Personal history of urinary (tract) infections: Secondary | ICD-10-CM | POA: Insufficient documentation

## 2015-05-30 DIAGNOSIS — Y9301 Activity, walking, marching and hiking: Secondary | ICD-10-CM | POA: Diagnosis not present

## 2015-05-30 DIAGNOSIS — Z792 Long term (current) use of antibiotics: Secondary | ICD-10-CM | POA: Diagnosis not present

## 2015-05-30 DIAGNOSIS — Y9289 Other specified places as the place of occurrence of the external cause: Secondary | ICD-10-CM | POA: Diagnosis not present

## 2015-05-30 DIAGNOSIS — Z87891 Personal history of nicotine dependence: Secondary | ICD-10-CM | POA: Insufficient documentation

## 2015-05-30 DIAGNOSIS — Z79899 Other long term (current) drug therapy: Secondary | ICD-10-CM | POA: Insufficient documentation

## 2015-05-30 MED ORDER — PERMETHRIN 5 % EX CREA: TOPICAL_CREAM | CUTANEOUS | Status: DC

## 2015-05-30 NOTE — ED Notes (Signed)
Pt is presented from a motel where she had the manager of the motel call for emergency help because she was having a panic attack. Currently pt is calm and states she wants to be checked out.

## 2015-05-30 NOTE — ED Provider Notes (Signed)
CSN: [id number]     Arrival date & time 05/30/15  2207 History  By signing my name below, I, Julien Nordmann, attest that this documentation has been prepared under the direction and in the presence of Quincy Carnes, PA-C. Electronically Signed: Julien Nordmann, ED Scribe. 05/30/2015. 11:38 PM.    Chief Complaint  Patient presents with  . Anxiety  . Insect Bite    The history is provided by the patient. No language interpreter was used.   HPI Comments: Dawn [name] is a 44 y.o. female who presents to the Emergency Department complaining of insect bites.  Patient has recently been staying at the [facility] [address] for the past 2 days and states this morning upon waking she felt bugs biting her back.  She states she did not tell management because she was afraid that they would make her leave.  She states her bites feels itchy.  She wants treatment for potential bed bugs.  VSS.  Past Medical History  Diagnosis Date  . Anemia   . HIV (human immunodeficiency virus infection) ([place])   . Headache(784.0)   . Anxiety   . Infection     UTI  . Fibroid    Past Surgical History  Procedure Laterality Date  . Dilation and curettage of uterus    . Induced abortion     Family History  Problem Relation Age of Onset  . Family history unknown: Yes   Social History  Substance Use Topics  . Smoking status: Former Research scientist (life sciences)  . Smokeless tobacco: Never Used     Comment: quit 8 yrs  . Alcohol Use: No     Comment: hx of alcohol abuse- none in 8 yrs   OB History    Gravida Para Term Preterm AB TAB SAB Ectopic Multiple Living   6 4 4  2 1 1   4      Review of Systems  Skin: Positive for rash.  All other systems reviewed and are negative.     Allergies  Chocolate and Tomato  Home Medications   Prior to Admission medications   Medication Sig Start Date End Date Taking? Authorizing Provider  albuterol (PROVENTIL HFA;VENTOLIN HFA) 108 (90 BASE) MCG/ACT inhaler Inhale 2 puffs into the lungs 3 (three) times  daily as needed for wheezing or shortness of breath.    Historical Provider, MD  darunavir (PREZISTA) 600 MG tablet Take 600 mg by mouth daily.     Historical Provider, MD  emtricitabine-tenofovir (TRUVADA) 200-300 MG per tablet Take 1 tablet by mouth daily.    Historical Provider, MD  ferrous sulfate 325 (65 FE) MG EC tablet Take 325 mg by mouth daily.    Historical Provider, MD  MedroxyPROGESTERone Acetate (DEPO-PROVERA IM) Inject 1 application into the muscle every 3 (three) months. Last injection October 2014    Historical Provider, MD  metroNIDAZOLE (FLAGYL) 500 MG tablet Take 1 tablet (500 mg total) by mouth 2 (two) times daily. One po bid x 7 days 08/31/13   Julianne Rice, MD  Multiple Vitamin (MULTIVITAMIN WITH MINERALS) TABS tablet Take 1 tablet by mouth daily.    Historical Provider, MD  ritonavir (NORVIR) 100 MG capsule Take 100 mg by mouth daily.     Historical Provider, MD   Triage vitals: BP 141/92 mmHg  Pulse 88  Temp(Src) 98.3 F (36.8 C) (Oral)  Resp 14  Ht 5\' 7"  (1.702 m)  Wt 150 lb (68.04 kg)  BMI 23.49 kg/m2  SpO2 100% Physical Exam  Constitutional: She  is oriented to person, place, and time. She appears well-developed and well-nourished. No distress.  HENT:  Head: Normocephalic and atraumatic.  Mouth/Throat: Oropharynx is clear and moist.  Eyes: Conjunctivae and EOM are normal. Pupils are equal, round, and reactive to light.  Neck: Normal range of motion. Neck supple.  Cardiovascular: Normal rate, regular rhythm and normal heart sounds.   Pulmonary/Chest: Effort normal and breath sounds normal. No respiratory distress. She has no wheezes.  Abdominal: Soft. Bowel sounds are normal. There is no tenderness. There is no guarding.  Musculoskeletal: Normal range of motion.  Scattered bug bites of upper back; appear pruritic without signs of superimposed infection or cellulitis  Neurological: She is alert and oriented to person, place, and time.  Skin: Skin is warm and  dry. She is not diaphoretic.  Psychiatric: She has a normal mood and affect. Her speech is normal. Her mood appears not anxious. She is not actively hallucinating. She expresses no homicidal and no suicidal ideation. She expresses no suicidal plans and no homicidal plans.  Nursing note and vitals reviewed.   ED Course  Procedures  DIAGNOSTIC STUDIES: Oxygen Saturation is 100% on RA, normal by my interpretation.  COORDINATION OF CARE:  11:36 PM Discussed treatment plan with pt at bedside and pt agreed to plan.  Labs Review Labs Reviewed - No data to display  Imaging Review No results found. I have personally reviewed and evaluated these images and lab results as part of my medical decision-making.   EKG Interpretation None      MDM   Final diagnoses:  Bug bites   44 year old female here requesting treatment for potential bedbugs. She is recently staying at the [facility] [address]. Triage note reports anxiety, however she makes no mention of this to me. She does have some scattered bug bites to her upper back without any signs of superimposed infection. Will start on permethrin.  Discussed plan with patient, he/she acknowledged understanding and agreed with plan of care.  Return precautions given for new or worsening symptoms.  I personally performed the services described in this documentation, which was scribed in my presence. The recorded information has been reviewed and is accurate.  Larene Pickett, PA-C 05/31/15 0113  Veatrice Kells, MD 05/31/15 [phone]

## 2015-05-30 NOTE — Discharge Instructions (Signed)
Take the prescribed medication as directed.  Notify management at hotel so they can wash sheets and clean room for you. Return to the ED for new or worsening symptoms.

## 2015-07-16 ENCOUNTER — Encounter (HOSPITAL_COMMUNITY): Payer: Self-pay | Admitting: *Deleted

## 2015-07-16 ENCOUNTER — Emergency Department (HOSPITAL_COMMUNITY)
Admission: EM | Admit: 2015-07-16 | Discharge: 2015-07-17 | Disposition: A | Discharge status: Home / Self Care | Payer: Medicaid Other | Attending: Emergency Medicine | Admitting: Emergency Medicine

## 2015-07-16 DIAGNOSIS — Z86018 Personal history of other benign neoplasm: Secondary | ICD-10-CM | POA: Insufficient documentation

## 2015-07-16 DIAGNOSIS — J069 Acute upper respiratory infection, unspecified: Secondary | ICD-10-CM | POA: Diagnosis not present

## 2015-07-16 DIAGNOSIS — Z760 Encounter for issue of repeat prescription: Secondary | ICD-10-CM | POA: Insufficient documentation

## 2015-07-16 DIAGNOSIS — Z21 Asymptomatic human immunodeficiency virus [HIV] infection status: Secondary | ICD-10-CM | POA: Insufficient documentation

## 2015-07-16 DIAGNOSIS — Z590 Homelessness: Secondary | ICD-10-CM | POA: Diagnosis not present

## 2015-07-16 DIAGNOSIS — R630 Anorexia: Secondary | ICD-10-CM | POA: Insufficient documentation

## 2015-07-16 DIAGNOSIS — R0789 Other chest pain: Secondary | ICD-10-CM | POA: Insufficient documentation

## 2015-07-16 DIAGNOSIS — R42 Dizziness and giddiness: Secondary | ICD-10-CM | POA: Diagnosis not present

## 2015-07-16 DIAGNOSIS — B20 Human immunodeficiency virus [HIV] disease: Secondary | ICD-10-CM

## 2015-07-16 DIAGNOSIS — R05 Cough: Secondary | ICD-10-CM

## 2015-07-16 DIAGNOSIS — Z79899 Other long term (current) drug therapy: Secondary | ICD-10-CM | POA: Diagnosis not present

## 2015-07-16 DIAGNOSIS — Z87442 Personal history of urinary calculi: Secondary | ICD-10-CM | POA: Diagnosis not present

## 2015-07-16 DIAGNOSIS — Z8759 Personal history of other complications of pregnancy, childbirth and the puerperium: Secondary | ICD-10-CM | POA: Diagnosis not present

## 2015-07-16 DIAGNOSIS — R059 Cough, unspecified: Secondary | ICD-10-CM

## 2015-07-16 DIAGNOSIS — Z862 Personal history of diseases of the blood and blood-forming organs and certain disorders involving the immune mechanism: Secondary | ICD-10-CM | POA: Diagnosis not present

## 2015-07-16 NOTE — ED Notes (Addendum)
Pt arrives to the ER via EMS for complaints of cough and congestion; pt states that she has been congested and has a productive cough with clear mucous; pt also c/o anxiety from the cough and feeling short of breath when she is coughing; pt states that she has been living in a house without heat until recently and that she hasn't been eating well and has been going to the shelter to eat; pt states that she has been laying in the bed mostly because of not feeling well and gets dizzy at times when she gets up; pt ambulatory from EMS bay to triage without difficulty; pt also c/o abd being sore at times from cough; pt denies abd currently; pt states that it happens more at night when she coughs more; pt also c/o throat irritation; pt denies difficulty swallowing or breathing at present

## 2015-07-16 NOTE — ED Notes (Signed)
Bed: [facility] Expected date:  Expected time:  Means of arrival:  Comments: EMS 73F abd pain x 2 weeks

## 2015-07-17 ENCOUNTER — Emergency Department (HOSPITAL_COMMUNITY): Payer: Medicaid Other

## 2015-07-17 MED ORDER — EMTRICITABINE-TENOFOVIR DF 200-300 MG PO TABS
1.0000 | ORAL_TABLET | Freq: Every day | ORAL | Status: DC
Start: 2015-07-17 — End: 2016-01-03

## 2015-07-17 MED ORDER — RITONAVIR 100 MG PO CAPS: 100.0000 mg | ORAL_CAPSULE | Freq: Every day | ORAL | Status: DC

## 2015-07-17 MED ORDER — AEROCHAMBER PLUS FLO-VU MEDIUM MISC
1.0000 | Freq: Once | Status: AC
  Administered 2015-07-17: 1
  Filled 2015-07-17: qty 1

## 2015-07-17 MED ORDER — BENZONATATE 100 MG PO CAPS: 200.0000 mg | ORAL_CAPSULE | Freq: Two times a day (BID) | ORAL | Status: DC | PRN

## 2015-07-17 MED ORDER — ALBUTEROL SULFATE HFA 108 (90 BASE) MCG/ACT IN AERS
2.0000 | INHALATION_SPRAY | RESPIRATORY_TRACT | Status: DC | PRN
  Administered 2015-07-17: 2 via RESPIRATORY_TRACT
  Filled 2015-07-17: qty 6.7

## 2015-07-17 MED ORDER — DARUNAVIR ETHANOLATE 600 MG PO TABS: 600.0000 mg | ORAL_TABLET | Freq: Every day | ORAL | Status: DC

## 2015-07-17 MED ORDER — AZITHROMYCIN 250 MG PO TABS: 250.0000 mg | ORAL_TABLET | Freq: Every day | ORAL | Status: DC

## 2015-07-17 NOTE — ED Notes (Signed)
Patient transported to X-ray 

## 2015-07-17 NOTE — ED Notes (Signed)
Pt is waiting in lobby until bus runs at 6 am,  I will take bus pass at that time and pt is aware

## 2015-07-17 NOTE — ED Provider Notes (Signed)
CSN: [id number]     Arrival date & time 07/16/15  2310 History   First MD Initiated Contact with Patient 07/17/15 0052     Chief Complaint  Patient presents with  . URI     (Consider location/radiation/quality/duration/timing/severity/associated sxs/prior Treatment) Patient is a 44 y.o. female presenting with URI. The history is provided by the patient and medical records. No language interpreter was used.  URI Presenting symptoms: congestion, cough, fatigue, rhinorrhea and sore throat   Presenting symptoms: no ear pain and no fever   Associated symptoms: no arthralgias, no headaches, no myalgias and no wheezing    Mee Cherney is a 44 y.o. female  with a hx of anemia, HIV, anxiety presents to the Emergency Department complaining of gradual, persistent, progressively worsening cough, rib pain, sore throat, rhinorrhea, post nasal drip onset 2 weeks.  Pt reports she is homeless and has been around a lot of sick people. She reports cough productive of yellow sputum.  No treatments PTA.  Pt reports bilateral rib pain with coughing.  Pt denies smoking, but is around a lot of people who smoke. Walking makes her cough worse. She reports she has been living in a house without heat.  She reports she is eating at the shelter.  Pt denies fever, chills, headache, neck pain, abd pain, N/V/D, weakness, syncope, dysuria.  Pt also feels lightheaded when she stands, but denies room spinning dizziness.   Unknown last CD4 count or viral load, but pt reports being compliant with her medications.     Past Medical History  Diagnosis Date  . Anemia   . HIV (human immunodeficiency virus infection) ([place])   . Headache(784.0)   . Anxiety   . Infection     UTI  . Fibroid    Past Surgical History  Procedure Laterality Date  . Dilation and curettage of uterus    . Induced abortion     Family History  Problem Relation Age of Onset  . Family history unknown: Yes   Social History  Substance Use Topics  .  Smoking status: Former Research scientist (life sciences)  . Smokeless tobacco: Never Used     Comment: quit 8 yrs  . Alcohol Use: Yes     Comment: hx of alcohol abuse- none in 8 yrs   OB History    Gravida Para Term Preterm AB TAB SAB Ectopic Multiple Living   6 4 4  2 1 1   4      Review of Systems  Constitutional: Positive for appetite change ( decreased) and fatigue. Negative for fever and chills.  HENT: Positive for congestion, postnasal drip, rhinorrhea, sinus pressure and sore throat. Negative for ear discharge, ear pain and mouth sores.   Eyes: Negative for visual disturbance.  Respiratory: Positive for cough, chest tightness and shortness of breath ( with cough). Negative for wheezing and stridor.   Cardiovascular: Negative for chest pain, palpitations and leg swelling.  Gastrointestinal: Negative for nausea, vomiting, abdominal pain and diarrhea.  Genitourinary: Negative for dysuria, urgency, frequency and hematuria.  Musculoskeletal: Negative for myalgias, back pain, arthralgias and neck stiffness.  Skin: Negative for rash.  Neurological: Negative for syncope, light-headedness, numbness and headaches.  Hematological: Negative for adenopathy.  Psychiatric/Behavioral: The patient is not nervous/anxious.   All other systems reviewed and are negative.     Allergies  Chocolate and Tomato  Home Medications   Prior to Admission medications   Medication Sig Start Date End Date Taking? Authorizing Provider  albuterol (PROVENTIL HFA;VENTOLIN HFA) 108 (  90 BASE) MCG/ACT inhaler Inhale 2 puffs into the lungs 3 (three) times daily as needed for wheezing or shortness of breath.   Yes Historical Provider, MD  MedroxyPROGESTERone Acetate (DEPO-PROVERA IM) Inject 1 application into the muscle every 3 (three) months. Last injection October Sept 2016   Yes Historical Provider, MD  azithromycin (ZITHROMAX) 250 MG tablet Take 1 tablet (250 mg total) by mouth daily. Take first 2 tablets together, then 1 every day until  finished. 07/17/15   Malcolm Quast, PA-C  benzonatate (TESSALON) 100 MG capsule Take 2 capsules (200 mg total) by mouth 2 (two) times daily as needed for cough. 07/17/15   Munir Victorian, PA-C  darunavir (PREZISTA) 600 MG tablet Take 1 tablet (600 mg total) by mouth daily. 07/17/15   Dagny Fiorentino, PA-C  emtricitabine-tenofovir (TRUVADA) 200-300 MG tablet Take 1 tablet by mouth daily. 07/17/15   Helayna Dun, PA-C  ritonavir (NORVIR) 100 MG capsule Take 1 capsule (100 mg total) by mouth daily. 07/17/15   Isaura Schiller, PA-C   BP 133/94 mmHg  Pulse 89  Temp(Src) 98.8 F (37.1 C) (Oral)  Resp 20  SpO2 100% Physical Exam  Constitutional: She is oriented to person, place, and time. She appears well-developed and well-nourished. No distress.  HENT:  Head: Normocephalic and atraumatic.  Right Ear: Tympanic membrane, external ear and ear canal normal.  Left Ear: Tympanic membrane, external ear and ear canal normal.  Nose: Mucosal edema and rhinorrhea present. No epistaxis. Right sinus exhibits no maxillary sinus tenderness and no frontal sinus tenderness. Left sinus exhibits no maxillary sinus tenderness and no frontal sinus tenderness.  Mouth/Throat: Uvula is midline, oropharynx is clear and moist and mucous membranes are normal. Mucous membranes are not pale and not cyanotic. No oropharyngeal exudate, posterior oropharyngeal edema, posterior oropharyngeal erythema or tonsillar abscesses.  Eyes: Conjunctivae are normal. Pupils are equal, round, and reactive to light.  Neck: Normal range of motion and full passive range of motion without pain.  Cardiovascular: Normal rate, normal heart sounds and intact distal pulses.   No murmur heard. Pulmonary/Chest: Effort normal and breath sounds normal. No stridor.  Clear and equal, but diminished breath sounds without focal wheezes, rhonchi, rales  Abdominal: Soft. Bowel sounds are normal. She exhibits no distension. There is  no tenderness.  Musculoskeletal: Normal range of motion.  Lymphadenopathy:    She has no cervical adenopathy.  Neurological: She is alert and oriented to person, place, and time.  Skin: Skin is warm and dry. No rash noted. She is not diaphoretic.  Psychiatric: She has a normal mood and affect.  Nursing note and vitals reviewed.   ED Course  Procedures (including critical care time) Labs Review Labs Reviewed - No data to display  Imaging Review Dg Chest 2 View  07/17/2015  CLINICAL DATA:  Sore throat, productive cough and mid upper chest pain for 2 weeks. EXAM: CHEST  2 VIEW COMPARISON:  12/29/2014 FINDINGS: The cardiomediastinal contours are normal. The lungs are clear. Pulmonary vasculature is normal. No consolidation, pleural effusion, or pneumothorax. No acute osseous abnormalities are seen. IMPRESSION: No acute pulmonary process. Electronically Signed   By: Jeb Levering M.D.   On: 07/17/2015 01:31   I have personally reviewed and evaluated these images and lab results as part of my medical decision-making.   EKG Interpretation None      MDM   Final diagnoses:  Cough  URI (upper respiratory infection)  HIV (human immunodeficiency virus infection) ([facility])  Medication refill  Kristeen Miss Filley presents with 2 weeks of viral URI symptoms. Pt CXR negative for acute infiltrate. Patients symptoms are consistent with URI, likely viral etiology however due to persistence of symptoms and HIV status, will d/c home with azithromycin.  Patient reports urine out of her HIV medications today.  She states she does not have a primary care managing her HIV at this time. Will refer patient to be [facility].  HIV medications refilled today. Pt will be discharged with symptomatic treatment.  Verbalizes understanding and is agreeable with plan. Pt is hemodynamically stable & in NAD prior to dc.  Filed Vitals:   07/16/15 2330  BP: 133/94  Pulse: 89  Temp: 98.8 F (37.1 C)  Resp: [place], PA-C 07/17/15 [place], DO 07/17/15 [phone]

## 2015-07-17 NOTE — Discharge Instructions (Signed)
1. Medications:OTC mucinex, azithromycin, albuterol, tessalon, usual home medications 2. Treatment: rest, drink plenty of fluids, take tylenol or ibuprofen for fever control 3. Follow Up: Please followup with your primary doctor in 3 days for discussion of your diagnoses and further evaluation after today's visit; if you do not have a primary care doctor use the resource guide provided to find one; Return to the ER for high fevers, difficulty breathing or other concerning symptoms   Cough, Adult Coughing is a reflex that clears your throat and your airways. Coughing helps to heal and protect your lungs. It is normal to cough occasionally, but a cough that happens with other symptoms or lasts a long time may be a sign of a condition that needs treatment. A cough may last only 2-3 weeks (acute), or it may last longer than 8 weeks (chronic). CAUSES Coughing is commonly caused by:  Breathing in substances that irritate your lungs.  A viral or bacterial respiratory infection.  Allergies.  Asthma.  Postnasal drip.  Smoking.  Acid backing up from the stomach into the esophagus (gastroesophageal reflux).  Certain medicines.  Chronic lung problems, including COPD (or rarely, lung cancer).  Other medical conditions such as heart failure. HOME CARE INSTRUCTIONS  Pay attention to any changes in your symptoms. Take these actions to help with your discomfort:  Take medicines only as told by your health care provider.  If you were prescribed an antibiotic medicine, take it as told by your health care provider. Do not stop taking the antibiotic even if you start to feel better.  Talk with your health care provider before you take a cough suppressant medicine.  Drink enough fluid to keep your urine clear or pale yellow.  If the air is dry, use a cold steam vaporizer or humidifier in your bedroom or your home to help loosen secretions.  Avoid anything that causes you to cough at work or at  home.  If your cough is worse at night, try sleeping in a semi-upright position.  Avoid cigarette smoke. If you smoke, quit smoking. If you need help quitting, ask your health care provider.  Avoid caffeine.  Avoid alcohol.  Rest as needed. SEEK MEDICAL CARE IF:   You have new symptoms.  You cough up pus.  Your cough does not get better after 2-3 weeks, or your cough gets worse.  You cannot control your cough with suppressant medicines and you are losing sleep.  You develop pain that is getting worse or pain that is not controlled with pain medicines.  You have a fever.  You have unexplained weight loss.  You have night sweats. SEEK IMMEDIATE MEDICAL CARE IF:  You cough up blood.  You have difficulty breathing.  Your heartbeat is very fast.   This information is not intended to replace advice given to you by your health care provider. Make sure you discuss any questions you have with your health care provider.   Document Released: 02/10/2011 Document Revised: 05/05/2015 Document Reviewed: 10/21/2014 Elsevier Interactive Patient Education 2016 [employer].    Emergency Department Resource Guide 1) Find a Doctor and Pay Out of Pocket Although you won't have to find out who is covered by your insurance plan, it is a good idea to ask around and get recommendations. You will then need to call the office and see if the doctor you have chosen will accept you as a new patient and what types of options they offer for patients who are self-pay. Some doctors  offer discounts or will set up payment plans for their patients who do not have insurance, but you will need to ask so you aren't surprised when you get to your appointment.  2) Contact Your Local Health Department Not all health departments have doctors that can see patients for sick visits, but many do, so it is worth a call to see if yours does. If you don't know where your local health department is, you can check in  your phone book. The CDC also has a tool to help you locate your state's health department, and many state websites also have listings of all of their local health departments.  3) Find a [place] [facility] If your illness is not likely to be very severe or complicated, you may want to try a walk in clinic. These are popping up all over the country in pharmacies, drugstores, and shopping centers. They're usually staffed by nurse practitioners or physician assistants that have been trained to treat common illnesses and complaints. They're usually fairly quick and inexpensive. However, if you have serious medical issues or chronic medical problems, these are probably not your best option.  No Primary Care Doctor: - Call Health Connect at  [phone] - they can help you locate a primary care doctor that  accepts your insurance, provides certain services, etc. - Physician Referral Service- [phone]  Chronic Pain Problems: Organization         Address  Phone   Notes  [facility]  [phone] Patients need to be referred by their primary care doctor.   Medication Assistance: Organization         Address  Phone   Notes  [facility] Medication [facility] [address] [place], [place] [address] [phone] --Must be a resident of [place] -- Must have NO insurance coverage whatsoever (no Medicaid/ Medicare, etc.) -- The pt. MUST have a primary care doctor that directs their care regularly and follows them in the community   MedAssist  [phone]   [employer]  [phone]    Agencies that provide inexpensive medical care: Organization         Address  Phone   Notes  [place]  [phone]   [facility]    [phone]   [facility] [place], [place] [address] [phone]   [place] [address] [place] [phone]    Planned Parenthood    [phone]   [facility]    [phone]   [place] [address], [place] Phone:  [phone], Fax:  [phone] Hours of Operation:  9 am - 6 pm, M-F.  Also accepts Medicaid/Medicare and self-pay.  [facility] for [place], [address], [place] Phone: [phone], Fax: [phone]. Hours of Operation:  8:30 am - 5:30 pm, M-F.  Also accepts Medicaid and self-pay.  [facility] [place] [address] [place] Phone: [phone]   [place], [place], [place] [phone], Ext. [phone] Mondays & Thursdays: 7-9 AM.  First 15 patients are seen on a first come, first serve basis.    [place] Providers:  Organization         Address  Phone   Notes  [facility] [address], Kristeen Mans  A,  [phone] Also accepts self-pay patients.  [facility] [id number] [place], [place]  [phone]   [place], [address], [place] [phone]   [facility] [address], [place] [phone]   Lucianne Lei [address], [place]   [phone] Only accepts [place] Access [place] patients after they have their name applied to their card.   Self-Pay (no insurance) in [facility]:  Organization         Address  Phone   Notes  Sickle Cell Patients, [facility] [place] [phone]   [facility] [place] [phone]   [facility] [place]  [place], [place], [place] [phone]   [facility]/Dr. Osei-Bonsu  [address] [place] or [address], [place] [phone] Phone number for both [place] and [place] locations is the  same.  [facility] [address], [place] [phone]   [facility] [address] [place] or [address] [phone]   [facility] [address], [place] [phone], phone; [phone], fax Sees patients 1st and 3rd Saturday of every month.  Must not qualify for public or private insurance (i.e. Medicaid, Medicare, [place] Health Choice, Veterans' Benefits)  Household income should be no more than 200% of the poverty level The clinic cannot treat you if you are pregnant or think you are pregnant  Sexually transmitted diseases are not treated at the clinic.    Dental Care: Organization         Address  Phone  Notes  [facility] Department of [facility] [place] [phone] Accepts children up to age 48 who are enrolled in [place] or [place]; pregnant women with a Medicaid card; and children who have applied for Medicaid or [place] Health Choice, but were declined, whose parents can pay a reduced fee at time of service.  [facility] Department of [facility]  [address], [place] [phone] Accepts children up to age 53 who are enrolled in [place] or [place]; pregnant women with a Medicaid card; and children who have applied for Medicaid or [place] Health Choice, but were declined, whose parents can pay a reduced fee at time of service.  [place] Adult Dental Access PROGRAM  [place] [phone] Patients are seen by appointment only. Walk-ins are not accepted. [place] will see patients 13 years of age and older. Monday - Tuesday (8am-5pm) Most Wednesdays (8:30-5pm) $30 per visit, cash only  [facility] Adult Dental Access PROGRAM  [address], [facility] [phone] Patients are seen by appointment only. Walk-ins are not accepted. [place] will see patients  38 years of age and older. One Wednesday Evening (Monthly: Volunteer Based).  $30 per visit, cash only  [place]  [phone] for adults; Children under age 30, call Graduate Pediatric Dentistry at [phone]. Children aged 68-14, please call [phone] to request a pediatric application.  Dental services are provided in all areas of dental care including fillings, crowns and bridges, complete and partial dentures, implants, gum treatment, root canals, and extractions. Preventive care  is also provided. Treatment is provided to both adults and children. Patients are selected via a lottery and there is often a waiting list.   [facility] [address], [place]  [phone] [email or web address]   [facility] [address] [place], [place] [phone], Ext. [phone] Second and Fourth Thursday of each month, opens at 6:30 AM; Clinic ends at 9 AM.  Patients are seen on a first-come first-served basis, and a limited number are seen during each clinic.   [facility]  [address] Hillard Danker [place], [place] [phone]   Eligibility Requirements You must have lived in [place], [place], or [place] counties for at least the last three months.   You cannot be eligible for state or federal sponsored [employer], including [employer], [place], or [employer].   You generally cannot be eligible for healthcare insurance through your employer.    How to apply: Eligibility screenings are held every Tuesday and Wednesday afternoon from 1:00 pm until 4:00 pm. You do not need an appointment for the interview!  [facility] [address], [place], [place]   [place]  [place] Department  [place]  [phone]    Behavioral Health Resources in the Community: Intensive Outpatient  Programs Organization         Address  Phone  Notes  [place]. [address] [place], [place] [phone]   [facility] Outpatient [address] [place], [place]   ADS: [facility] [address], [place], [place]   [place] [address]  [place], [place] or [phone]   Substance Abuse Resources Organization         Address  Phone  Notes  Alcohol and Drug Services  [phone]   [place]  [phone]   [facility] [place]   Chinita Pester  [phone]   Residential & Outpatient Substance Abuse Program  [phone]   Psychological Services Organization         Address  Phone  Notes  [facility] [place]  [place]  [phone]   [place] [address], [place] or [phone]    Mobile Crisis Teams Organization         Address  Phone  Notes  [facility], Mobile Crisis Care Unit  [phone]   [facility]  [address] [place], [place]   [place] [address] [place] [phone]    Self-Help/Support Groups Organization         Address  Phone             Notes  [place]. of [place] - variety of support groups  [place] Call for more information  Narcotics Anonymous (NA), [facility] [address], [employer] [place]  2 meetings at this location   Special educational needs teacher         Address  Phone  Notes  [facility] Residential Treatment [place],    [place]  [phone]   [facility]  [address], [place] [phone], [place], [place]   [place], [place] [phone] Admissions: 8am-3pm M-F  Incentives Substance [place] [address]  [place], [place]   [facility] [address] Jadene Pierini [place], [place]   The [place].,  [place], [place]   [facility] - Intensive Outpatient [address] Kristeen Mans [address], [place], [place]   [facility] ([place] [address]  [place], [place] [phone] or [phone]   [facility] (RT[facility]) [address], [place], [place] Accepts Medicaid  [facility] [place] [address]  [place] [phone] Substance Abuse/Addiction Treatment   [facility] Organization         Address  Phone  Notes  [facility]  [phone]   Domenic Schwab, PhD [address] Arlis [place], [place]   [phone] or [phone]   [place], [place] [phone]   [facility] [address] [place], [place] [phone] Insurance/Medicaid/sponsorship through [facility] and Families [address]                                    [place], [place] [phone] [place] [address], [place] [phone]    Dr. Adele Schilder  [phone]   [facility] Dept. 1) [address], [place] 2) [place] 3)  [place] [address], [place] [phone]  [phone]   [place] [phone] or [phone] (After Hours)

## 2015-10-02 ENCOUNTER — Emergency Department (HOSPITAL_COMMUNITY)
Admission: EM | Admit: 2015-10-02 | Discharge: 2015-10-02 | Disposition: A | Discharge status: Home / Self Care | Payer: Medicaid Other | Attending: Emergency Medicine | Admitting: Emergency Medicine

## 2015-10-02 DIAGNOSIS — N938 Other specified abnormal uterine and vaginal bleeding: Secondary | ICD-10-CM | POA: Insufficient documentation

## 2015-10-02 DIAGNOSIS — Z79899 Other long term (current) drug therapy: Secondary | ICD-10-CM | POA: Diagnosis not present

## 2015-10-02 DIAGNOSIS — Z862 Personal history of diseases of the blood and blood-forming organs and certain disorders involving the immune mechanism: Secondary | ICD-10-CM | POA: Insufficient documentation

## 2015-10-02 DIAGNOSIS — Z3202 Encounter for pregnancy test, result negative: Secondary | ICD-10-CM | POA: Diagnosis not present

## 2015-10-02 DIAGNOSIS — Z87891 Personal history of nicotine dependence: Secondary | ICD-10-CM | POA: Diagnosis not present

## 2015-10-02 DIAGNOSIS — B20 Human immunodeficiency virus [HIV] disease: Secondary | ICD-10-CM | POA: Insufficient documentation

## 2015-10-02 DIAGNOSIS — Z8659 Personal history of other mental and behavioral disorders: Secondary | ICD-10-CM | POA: Insufficient documentation

## 2015-10-02 DIAGNOSIS — Z87440 Personal history of urinary (tract) infections: Secondary | ICD-10-CM | POA: Diagnosis not present

## 2015-10-02 DIAGNOSIS — Z86018 Personal history of other benign neoplasm: Secondary | ICD-10-CM | POA: Diagnosis not present

## 2015-10-02 DIAGNOSIS — M545 Low back pain: Secondary | ICD-10-CM | POA: Insufficient documentation

## 2015-10-02 DIAGNOSIS — N939 Abnormal uterine and vaginal bleeding, unspecified: Secondary | ICD-10-CM | POA: Diagnosis present

## 2015-10-02 LAB — I-STAT BETA HCG BLOOD, ED (MC, WL, AP ONLY): I-stat hCG, quantitative: <5 m[IU]/mL (ref <5)

## 2015-10-02 LAB — CBC
HCT: 35.8 % — ABNORMAL LOW (ref 36.0–46.0)
Hemoglobin: 11.5 g/dL — ABNORMAL LOW (ref 12.0–15.0)
MCH: 27.8 pg (ref 26.0–34.0)
MCHC: 32.1 g/dL (ref 30.0–36.0)
MCV: 86.7 fL (ref 78.0–100.0)
Platelets: 234 10*3/uL (ref 150–400)
RBC: 4.13 MIL/uL (ref 3.87–5.11)
RDW: 13.9 % (ref 11.5–15.5)
WBC: 2.1 10*3/uL — ABNORMAL LOW (ref 4.0–10.5)

## 2015-10-02 NOTE — ED Notes (Signed)
Bed: WA09 Expected date: 10/02/15 Expected time: 7:46 AM Means of arrival: Ambulance Comments: Vag Bleeding

## 2015-10-02 NOTE — Discharge Instructions (Signed)
It was our pleasure to provide your ER care today - we hope that you feel better.  Rest. Drink plenty of fluids.   You may take tylenol or advil as need for cramping.   For vaginal bleeding, follow up with ob/gyn doctor in the next 1-2 weeks - see referral - call Monday to arrange appointment.  For HIV, follow up with the [facility] for Infectious Disease - see referral - call Monday to arrange appointment.  Return to ER if worse, new symptoms, fevers, severe pain, fainting, medical emergency, other concern.    Abnormal Uterine Bleeding Abnormal uterine bleeding can affect women at various stages in life, including teenagers, women in their reproductive years, pregnant women, and women who have reached menopause. Several kinds of uterine bleeding are considered abnormal, including:  Bleeding or spotting between periods.   Bleeding after sexual intercourse.   Bleeding that is heavier or more than normal.   Periods that last longer than usual.  Bleeding after menopause.  Many cases of abnormal uterine bleeding are minor and simple to treat, while others are more serious. Any type of abnormal bleeding should be evaluated by your health care provider. Treatment will depend on the cause of the bleeding. HOME CARE INSTRUCTIONS Monitor your condition for any changes. The following actions may help to alleviate any discomfort you are experiencing:  Avoid the use of tampons and douches as directed by your health care provider.  Change your pads frequently. You should get regular pelvic exams and Pap tests. Keep all follow-up appointments for diagnostic tests as directed by your health care provider.  SEEK MEDICAL CARE IF:   Your bleeding lasts more than 1 week.   You feel dizzy at times.  SEEK IMMEDIATE MEDICAL CARE IF:   You pass out.   You are changing pads every 15 to 30 minutes.   You have abdominal pain.  You have a fever.   You become sweaty or  weak.   You are passing large blood clots from the vagina.   You start to feel nauseous and vomit. MAKE SURE YOU:   Understand these instructions.  Will watch your condition.  Will get help right away if you are not doing well or get worse.   This information is not intended to replace advice given to you by your health care provider. Make sure you discuss any questions you have with your health care provider.   Document Released: 08/14/2005 Document Revised: 08/19/2013 Document Reviewed: 03/13/2013 Elsevier Interactive Patient Education [employer].  Menorrhagia Menorrhagia is the medical term for when your menstrual periods are heavy or last longer than usual. With menorrhagia, every period you have may cause enough blood loss and cramping that you are unable to maintain your usual activities. CAUSES  In some cases, the cause of heavy periods is unknown, but a number of conditions may cause menorrhagia. Common causes include:  A problem with the hormone-producing thyroid gland (hypothyroid).  Noncancerous growths in the uterus (polyps or fibroids).  An imbalance of the estrogen and progesterone hormones.  One of your ovaries not releasing an egg during one or more months.  Side effects of having an intrauterine device (IUD).  Side effects of some medicines, such as anti-inflammatory medicines or blood thinners.  A bleeding disorder that stops your blood from clotting normally. SIGNS AND SYMPTOMS  During a normal period, bleeding lasts between 4 and 8 days. Signs that your periods are too heavy include:  You routinely have  to change your pad or tampon every 1 or 2 hours because it is completely soaked.  You pass blood clots larger than 1 inch (2.5 cm) in size.  You have bleeding for more than 7 days.  You need to use pads and tampons at the same time because of heavy bleeding.  You need to wake up to change your pads or tampons during the night.  You have  symptoms of anemia, such as tiredness, fatigue, or shortness of breath. DIAGNOSIS  Your health care provider will perform a physical exam and ask you questions about your symptoms and menstrual history. Other tests may be ordered based on what the health care provider finds during the exam. These tests can include:  Blood tests. Blood tests are used to check if you are pregnant or have hormonal changes, a bleeding or thyroid disorder, low iron levels (anemia), or other problems.  Endometrial biopsy. Your health care provider takes a sample of tissue from the inside of your uterus to be examined under a microscope.  Pelvic ultrasound. This test uses sound waves to make a picture of your uterus, ovaries, and vagina. The pictures can show if you have fibroids or other growths.  Hysteroscopy. For this test, your health care provider will use a small telescope to look inside your uterus. Based on the results of your initial tests, your health care provider may recommend further testing. TREATMENT  Treatment may not be needed. If it is needed, your health care provider may recommend treatment with one or more medicines first. If these do not reduce bleeding enough, a surgical treatment might be an option. The best treatment for you will depend on:   Whether you need to prevent pregnancy.  Your desire to have children in the future.  The cause and severity of your bleeding.  Your opinion and personal preference.  Medicines for menorrhagia may include:  Birth control methods that use hormones. These include the pill, skin patch, vaginal ring, shots that you get every 3 months, hormonal IUD, and implant. These treatments reduce bleeding during your menstrual period.  Medicines that thicken blood and slow bleeding.  Medicines that reduce swelling, such as ibuprofen.  Medicines that contain a synthetic hormone called progestin.   Medicines that make the ovaries stop working for a short  time.  You may need surgical treatment for menorrhagia if the medicines are unsuccessful. Treatment options include:  Dilation and curettage (D&C). In this procedure, your health care provider opens (dilates) your cervix and then scrapes or suctions tissue from the lining of your uterus to reduce menstrual bleeding.  Operative hysteroscopy. This procedure uses a tiny tube with a light (hysteroscope) to view your uterine cavity and can help in the surgical removal of a polyp that may be causing heavy periods.  Endometrial ablation. Through various techniques, your health care provider permanently destroys the entire lining of your uterus (endometrium). After endometrial ablation, most women have little or no menstrual flow. Endometrial ablation reduces your ability to become pregnant.  Endometrial resection. This surgical procedure uses an electrosurgical wire loop to remove the lining of the uterus. This procedure also reduces your ability to become pregnant.  Hysterectomy. Surgical removal of the uterus and cervix is a permanent procedure that stops menstrual periods. Pregnancy is not possible after a hysterectomy. This procedure requires anesthesia and hospitalization. HOME CARE INSTRUCTIONS   Only take over-the-counter or prescription medicines as directed by your health care provider. Take prescribed medicines exactly as directed. Do  not change or switch medicines without consulting your health care provider.  Take any prescribed iron pills exactly as directed by your health care provider. Long-term heavy bleeding may result in low iron levels. Iron pills help replace the iron your body lost from heavy bleeding. Iron may cause constipation. If this becomes a problem, increase the bran, fruits, and roughage in your diet.  Do not take aspirin or medicines that contain aspirin 1 week before or during your menstrual period. Aspirin may make the bleeding worse.  If you need to change your  sanitary pad or tampon more than once every 2 hours, stay in bed and rest as much as possible until the bleeding stops.  Eat well-balanced meals. Eat foods high in iron. Examples are leafy green vegetables, meat, liver, eggs, and whole grain breads and cereals. Do not try to lose weight until the abnormal bleeding has stopped and your blood iron level is back to normal. SEEK MEDICAL CARE IF:   You soak through a pad or tampon every 1 or 2 hours, and this happens every time you have a period.  You need to use pads and tampons at the same time because you are bleeding so much.  You need to change your pad or tampon during the night.  You have a period that lasts for more than 8 days.  You pass clots bigger than 1 inch wide.  You have irregular periods that happen more or less often than once a month.  You feel dizzy or faint.  You feel very weak or tired.  You feel short of breath or feel your heart is beating too fast when you exercise.  You have nausea and vomiting or diarrhea while you are taking your medicine.  You have any problems that may be related to the medicine you are taking. SEEK IMMEDIATE MEDICAL CARE IF:   You soak through 4 or more pads or tampons in 2 hours.  You have any bleeding while you are pregnant. MAKE SURE YOU:   Understand these instructions.  Will watch your condition.  Will get help right away if you are not doing well or get worse.   This information is not intended to replace advice given to you by your health care provider. Make sure you discuss any questions you have with your health care provider.   Document Released: 08/14/2005 Document Revised: 08/19/2013 Document Reviewed: 02/02/2013 Elsevier Interactive Patient Education [employer].

## 2015-10-02 NOTE — ED Provider Notes (Signed)
CSN: [id number]     Arrival date & time 10/02/15  [id number] History   First MD Initiated Contact with Patient 10/02/15 [phone]     Chief Complaint  Patient presents with  . Vaginal Bleeding     (Consider location/radiation/quality/duration/timing/severity/associated sxs/prior Treatment) Patient is a 45 y.o. female presenting with vaginal bleeding. The history is provided by the patient.  Vaginal Bleeding Associated symptoms: no fever   Patient presents w vaginal bleeding x 2 days.  States is heavier than normal period, with occasional clots. States uses 7-8 pads per day. Intermittent lower abd and low back cramping, c/w prior menstrual cramps. Denies faintness/dizziness, no syncope. No fever or chills. Denies hx fibroids or ovarian cysts.  States last had bleeding 1 month ago.  States had depo shot 02/2014.  No other abnormal bruising or bleeding.     Past Medical History  Diagnosis Date  . Anemia   . HIV (human immunodeficiency virus infection) ([place])   . Headache(784.0)   . Anxiety   . Infection     UTI  . Fibroid    Past Surgical History  Procedure Laterality Date  . Dilation and curettage of uterus    . Induced abortion     Family History  Problem Relation Age of Onset  . Family history unknown: Yes   Social History  Substance Use Topics  . Smoking status: Former Research scientist (life sciences)  . Smokeless tobacco: Never Used     Comment: quit 8 yrs  . Alcohol Use: Yes     Comment: hx of alcohol abuse- none in 8 yrs   OB History    Gravida Para Term Preterm AB TAB SAB Ectopic Multiple Living   6 4 4  2 1 1   4      Review of Systems  Constitutional: Negative for fever.  HENT: Negative for nosebleeds.   Eyes: Negative for redness.  Respiratory: Negative for cough and shortness of breath.   Cardiovascular: Negative for chest pain.  Gastrointestinal: Negative for vomiting and blood in stool.  Genitourinary: Positive for vaginal bleeding. Negative for hematuria and flank pain.  Musculoskeletal:  Negative for neck pain.  Skin: Negative for rash.  Neurological: Negative for light-headedness.  Hematological: Does not bruise/bleed easily.  Psychiatric/Behavioral: Negative for confusion.      Allergies  Chocolate and Tomato  Home Medications   Prior to Admission medications   Medication Sig Start Date End Date Taking? Authorizing Provider  albuterol (PROVENTIL HFA;VENTOLIN HFA) 108 (90 BASE) MCG/ACT inhaler Inhale 2 puffs into the lungs 3 (three) times daily as needed for wheezing or shortness of breath.    Historical Provider, MD  azithromycin (ZITHROMAX) 250 MG tablet Take 1 tablet (250 mg total) by mouth daily. Take first 2 tablets together, then 1 every day until finished. 07/17/15   Hannah Muthersbaugh, PA-C  benzonatate (TESSALON) 100 MG capsule Take 2 capsules (200 mg total) by mouth 2 (two) times daily as needed for cough. 07/17/15   Hannah Muthersbaugh, PA-C  darunavir (PREZISTA) 600 MG tablet Take 1 tablet (600 mg total) by mouth daily. 07/17/15   Hannah Muthersbaugh, PA-C  emtricitabine-tenofovir (TRUVADA) 200-300 MG tablet Take 1 tablet by mouth daily. 07/17/15   Hannah Muthersbaugh, PA-C  ritonavir (NORVIR) 100 MG capsule Take 1 capsule (100 mg total) by mouth daily. 07/17/15   Hannah Muthersbaugh, PA-C   BP 147/89 mmHg  Pulse 80  Temp(Src) 98.1 F (36.7 C) (Oral)  Resp 18  SpO2 100%  LMP 09/30/2015 Physical Exam  Constitutional:  She appears well-developed and well-nourished. No distress.  HENT:  Mouth/Throat: Oropharynx is clear and moist.  Eyes: Conjunctivae are normal. No scleral icterus.  Neck: Neck supple. No tracheal deviation present.  Cardiovascular: Normal rate and intact distal pulses.   Pulmonary/Chest: Effort normal. No respiratory distress.  Abdominal: Soft. Normal appearance and bowel sounds are normal. She exhibits no distension and no mass. There is no tenderness. There is no rebound and no guarding.  Genitourinary:  No cva or flank tenderness.  Normal ext exam. +dark blood, clots in vagina. No active or brisk bleeding from cervix. Cervix closed. No cmt. No adx masses/tenderness.   Musculoskeletal: She exhibits no edema.  Neurological: She is alert.  Skin: Skin is warm and dry. No rash noted. She is not diaphoretic. No pallor.  Psychiatric: She has a normal mood and affect.  Nursing note and vitals reviewed.   ED Course  Procedures (including critical care time) Labs Review  Results for orders placed or performed during the hospital encounter of 10/02/15  CBC  Result Value Ref Range   WBC 2.1 (L) 4.0 - 10.5 K/uL   RBC 4.13 3.87 - 5.11 MIL/uL   Hemoglobin 11.5 (L) 12.0 - 15.0 g/dL   HCT 35.8 (L) 36.0 - 46.0 %   MCV 86.7 78.0 - 100.0 fL   MCH 27.8 26.0 - 34.0 pg   MCHC 32.1 30.0 - 36.0 g/dL   RDW 13.9 11.5 - 15.5 %   Platelets 234 150 - 400 K/uL  I-Stat Beta hCG blood, ED ([facility], [facility], [facility] only)  Result Value Ref Range   I-stat hCG, quantitative <5.0 <5 mIU/mL   Comment 3              I have personally reviewed and evaluated these lab results as part of my medical decision-making.    MDM   Labs.  Reviewed nursing notes and prior charts for additional history.   abd soft nt.  Vitals normal.   hgb 11.5.  Pt currently appears stable for d/c.  Pt indicates new to area, requests referral to ID, and ob/gyn - will provide.   Return precautions discussed.      Lajean Saver, MD 10/02/15 [phone]

## 2015-10-02 NOTE — ED Notes (Signed)
Patient ambulated to restroom, with no distress. Given washcloths to clean up with. Will get vitals upon her return

## 2015-10-02 NOTE — ED Notes (Signed)
Pt in restroom 

## 2015-10-02 NOTE — ED Notes (Signed)
Per EMS, pt from home.  Pt c/o vaginal bleeding x 2 days with large clots.  Pt last depo was July.  Last menstrual period was a month ago.  Pt is more concerned this month b/c of increased in clots with abdominal pain.  Vitals: 140/82, hr 110, resp 18,

## 2015-10-15 ENCOUNTER — Telehealth: Payer: Self-pay

## 2015-10-15 NOTE — Telephone Encounter (Signed)
Patient says she was told to call the office to return for HIV care.  She  has been positive for 20 years and was going to [facility] but has not being compliant.   She was told by Delories Heinz, RN to call for appointment.   Appointment given/

## 2015-10-26 ENCOUNTER — Ambulatory Visit: Payer: Medicaid Other

## 2015-10-28 ENCOUNTER — Telehealth: Payer: Self-pay | Admitting: General Practice

## 2015-10-28 DIAGNOSIS — N939 Abnormal uterine and vaginal bleeding, unspecified: Secondary | ICD-10-CM

## 2015-10-28 NOTE — Telephone Encounter (Signed)
Patient called and left message stating she has an appt scheduled for 3/13 @2pm . Patient states she cannot keep that appointment because she is continuing to hemorrhage and pass blood clots. Patient is calling because she doesn't know if she needs a sooner appt or if she should go to the ER. Called patient back and asked about the bleeding she has been having. Patient states she has been on her period for 5 days now and changes her pads 4 times a day. Reassured patient of bleeding and suggested scheduling an ultrasound prior to her appt. U/s scheduled for 3/9 per Dr Nehemiah Settle. Informed patient. Patient verbalized understanding and hung up the phone. Called the patient back and she states she is running low on her minutes and cannot talk long. Patient states she is very concerned about the bleeding and that something is going to pop open. Reassured patient of her bleeding pattern and discussed what worrisome bleeding would be. Patient verbalized understanding and hung up phone again. Called patient back several times and phone goes to voicemail each time.

## 2015-11-04 ENCOUNTER — Ambulatory Visit (HOSPITAL_COMMUNITY): Payer: Medicaid Other

## 2015-11-05 ENCOUNTER — Encounter: Payer: Medicaid Other | Admitting: Obstetrics & Gynecology

## 2015-11-08 ENCOUNTER — Encounter: Payer: Medicaid Other | Admitting: Obstetrics & Gynecology

## 2015-12-02 ENCOUNTER — Telehealth: Payer: Self-pay

## 2015-12-02 NOTE — Telephone Encounter (Signed)
Patient called regarding missed intake appointment. She is now ready to schedule appointment.   I will call patient with an intake appointment once I have the schedule in epic.   Laverle Patter, RN

## 2015-12-07 ENCOUNTER — Telehealth: Payer: Self-pay

## 2015-12-07 DIAGNOSIS — Z21 Asymptomatic human immunodeficiency virus [HIV] infection status: Secondary | ICD-10-CM

## 2015-12-07 DIAGNOSIS — L700 Acne vulgaris: Secondary | ICD-10-CM | POA: Insufficient documentation

## 2015-12-07 DIAGNOSIS — B20 Human immunodeficiency virus [HIV] disease: Secondary | ICD-10-CM | POA: Insufficient documentation

## 2015-12-07 DIAGNOSIS — D649 Anemia, unspecified: Secondary | ICD-10-CM | POA: Insufficient documentation

## 2015-12-07 DIAGNOSIS — Z111 Encounter for screening for respiratory tuberculosis: Secondary | ICD-10-CM

## 2015-12-07 DIAGNOSIS — Z79899 Other long term (current) drug therapy: Secondary | ICD-10-CM

## 2015-12-07 DIAGNOSIS — Z113 Encounter for screening for infections with a predominantly sexual mode of transmission: Secondary | ICD-10-CM

## 2015-12-07 DIAGNOSIS — F39 Unspecified mood [affective] disorder: Secondary | ICD-10-CM | POA: Insufficient documentation

## 2015-12-07 NOTE — Telephone Encounter (Signed)
Patient is calling to reschedule initial appointment.  She is ready to return to care.  She was not able to get transportation to [facility] and was going to Emergency Room for refills.  She contacted [facility] for a one month supply and promises to keep lab and office visit appointments.   I will place orders for labs.   Laverle Patter, RN

## 2015-12-09 ENCOUNTER — Other Ambulatory Visit (HOSPITAL_COMMUNITY)
Admission: RE | Admit: 2015-12-09 | Discharge: 2015-12-09 | Disposition: A | Discharge status: Home / Self Care | Payer: Medicaid Other | Source: Ambulatory Visit | Attending: Internal Medicine | Admitting: Internal Medicine

## 2015-12-09 ENCOUNTER — Other Ambulatory Visit: Payer: Medicaid Other

## 2015-12-09 DIAGNOSIS — Z79899 Other long term (current) drug therapy: Secondary | ICD-10-CM

## 2015-12-09 DIAGNOSIS — Z113 Encounter for screening for infections with a predominantly sexual mode of transmission: Secondary | ICD-10-CM | POA: Diagnosis present

## 2015-12-09 DIAGNOSIS — B20 Human immunodeficiency virus [HIV] disease: Secondary | ICD-10-CM

## 2015-12-09 LAB — CBC WITH DIFFERENTIAL/PLATELET
Basophils Absolute: 0 cells/uL (ref 0–200)
Basophils Relative: 0 %
Eosinophils Absolute: 28 cells/uL (ref 15–500)
Eosinophils Relative: 1 %
HCT: 25.4 % — ABNORMAL LOW (ref 35.0–45.0)
Hemoglobin: 7.8 g/dL — ABNORMAL LOW (ref 11.7–15.5)
Lymphocytes Relative: 42 %
Lymphs Abs: 1176 cells/uL (ref 850–3900)
MCH: 24.2 pg — ABNORMAL LOW (ref 27.0–33.0)
MCHC: 30.7 g/dL — ABNORMAL LOW (ref 32.0–36.0)
MCV: 78.9 fL — ABNORMAL LOW (ref 80.0–100.0)
MPV: 9.4 fL (ref 7.5–12.5)
Monocytes Absolute: 420 cells/uL (ref 200–950)
Monocytes Relative: 15 %
Neutro Abs: 1176 cells/uL — ABNORMAL LOW (ref 1500–7800)
Neutrophils Relative %: 42 %
Platelets: 382 10*3/uL (ref 140–400)
RBC: 3.22 MIL/uL — ABNORMAL LOW (ref 3.80–5.10)
RDW: 15.4 % — ABNORMAL HIGH (ref 11.0–15.0)
WBC: 2.8 10*3/uL — ABNORMAL LOW (ref 3.8–10.8)

## 2015-12-09 LAB — COMPLETE METABOLIC PANEL WITH GFR
ALT: 8 U/L (ref 6–29)
AST: 15 U/L (ref 10–30)
Albumin: 4 g/dL (ref 3.6–5.1)
Alkaline Phosphatase: 54 U/L (ref 33–115)
BUN: 13 mg/dL (ref 7–25)
CO2: 22 mmol/L (ref 20–31)
Calcium: 9 mg/dL (ref 8.6–10.2)
Chloride: 103 mmol/L (ref 98–110)
Creat: 0.84 mg/dL (ref 0.50–1.10)
GFR, Est African American: >89 mL/min (ref >=60)
GFR, Est Non African American: 86 mL/min (ref >=60)
Glucose, Bld: 78 mg/dL (ref 65–99)
Potassium: 4.4 mmol/L (ref 3.5–5.3)
Sodium: 136 mmol/L (ref 135–146)
Total Bilirubin: 0.4 mg/dL (ref 0.2–1.2)
Total Protein: 7.9 g/dL (ref 6.1–8.1)

## 2015-12-09 LAB — LIPID PANEL
Cholesterol: 141 mg/dL (ref 125–200)
HDL: 62 mg/dL (ref >=46)
LDL Cholesterol: 68 mg/dL (ref <130)
Total CHOL/HDL Ratio: 2.3 Ratio (ref <=5.0)
Triglycerides: 56 mg/dL (ref <150)
VLDL: 11 mg/dL (ref <30)

## 2015-12-10 ENCOUNTER — Emergency Department (HOSPITAL_COMMUNITY)
Admission: EM | Admit: 2015-12-10 | Discharge: 2015-12-10 | Disposition: A | Discharge status: Home / Self Care | Payer: Medicaid Other | Attending: Emergency Medicine | Admitting: Emergency Medicine

## 2015-12-10 ENCOUNTER — Encounter (HOSPITAL_COMMUNITY): Payer: Self-pay | Admitting: Emergency Medicine

## 2015-12-10 DIAGNOSIS — Z86018 Personal history of other benign neoplasm: Secondary | ICD-10-CM | POA: Diagnosis not present

## 2015-12-10 DIAGNOSIS — R110 Nausea: Secondary | ICD-10-CM | POA: Diagnosis not present

## 2015-12-10 DIAGNOSIS — R51 Headache: Secondary | ICD-10-CM | POA: Insufficient documentation

## 2015-12-10 DIAGNOSIS — Z3202 Encounter for pregnancy test, result negative: Secondary | ICD-10-CM | POA: Insufficient documentation

## 2015-12-10 DIAGNOSIS — Z862 Personal history of diseases of the blood and blood-forming organs and certain disorders involving the immune mechanism: Secondary | ICD-10-CM | POA: Insufficient documentation

## 2015-12-10 DIAGNOSIS — R000 Tachycardia, unspecified: Secondary | ICD-10-CM | POA: Insufficient documentation

## 2015-12-10 DIAGNOSIS — H53149 Visual discomfort, unspecified: Secondary | ICD-10-CM | POA: Diagnosis not present

## 2015-12-10 DIAGNOSIS — Z87440 Personal history of urinary (tract) infections: Secondary | ICD-10-CM | POA: Insufficient documentation

## 2015-12-10 DIAGNOSIS — B20 Human immunodeficiency virus [HIV] disease: Secondary | ICD-10-CM | POA: Insufficient documentation

## 2015-12-10 DIAGNOSIS — F17210 Nicotine dependence, cigarettes, uncomplicated: Secondary | ICD-10-CM | POA: Insufficient documentation

## 2015-12-10 DIAGNOSIS — R519 Headache, unspecified: Secondary | ICD-10-CM

## 2015-12-10 DIAGNOSIS — Z79899 Other long term (current) drug therapy: Secondary | ICD-10-CM | POA: Diagnosis not present

## 2015-12-10 LAB — RPR

## 2015-12-10 LAB — CBC WITH DIFFERENTIAL/PLATELET
Basophils Absolute: 0 10*3/uL (ref 0.0–0.1)
Basophils Relative: 0 %
Eosinophils Absolute: 0 10*3/uL (ref 0.0–0.7)
Eosinophils Relative: 0 %
HCT: 26.2 % — ABNORMAL LOW (ref 36.0–46.0)
Hemoglobin: 8.2 g/dL — ABNORMAL LOW (ref 12.0–15.0)
Lymphocytes Relative: 14 %
Lymphs Abs: 0.8 10*3/uL (ref 0.7–4.0)
MCH: 23.5 pg — ABNORMAL LOW (ref 26.0–34.0)
MCHC: 31.3 g/dL (ref 30.0–36.0)
MCV: 75.1 fL — ABNORMAL LOW (ref 78.0–100.0)
Monocytes Absolute: 0.3 10*3/uL (ref 0.1–1.0)
Monocytes Relative: 6 %
Neutro Abs: 4.4 10*3/uL (ref 1.7–7.7)
Neutrophils Relative %: 80 %
Platelets: 364 10*3/uL (ref 150–400)
RBC: 3.49 MIL/uL — ABNORMAL LOW (ref 3.87–5.11)
RDW: 15.3 % (ref 11.5–15.5)
WBC: 5.5 10*3/uL (ref 4.0–10.5)

## 2015-12-10 LAB — URINALYSIS
Bilirubin Urine: NEGATIVE
Glucose, UA: NEGATIVE
Hgb urine dipstick: NEGATIVE
Ketones, ur: NEGATIVE
Leukocytes, UA: NEGATIVE
Nitrite: NEGATIVE
Protein, ur: NEGATIVE
Specific Gravity, Urine: 1.023 (ref 1.001–1.035)
pH: 6 (ref 5.0–8.0)

## 2015-12-10 LAB — BASIC METABOLIC PANEL
Anion gap: 11 (ref 5–15)
BUN: 14 mg/dL (ref 6–20)
CO2: 24 mmol/L (ref 22–32)
Calcium: 9.7 mg/dL (ref 8.9–10.3)
Chloride: 106 mmol/L (ref 101–111)
Creatinine, Ser: 0.83 mg/dL (ref 0.44–1.00)
GFR calc Af Amer: >60 mL/min (ref >60)
GFR calc non Af Amer: >60 mL/min (ref >60)
Glucose, Bld: 102 mg/dL — ABNORMAL HIGH (ref 65–99)
Potassium: 3.7 mmol/L (ref 3.5–5.1)
Sodium: 141 mmol/L (ref 135–145)

## 2015-12-10 LAB — HEPATITIS C ANTIBODY: HCV Ab: NEGATIVE

## 2015-12-10 LAB — HEPATITIS B SURFACE ANTIBODY,QUALITATIVE: Hep B S Ab: POSITIVE — AB

## 2015-12-10 LAB — HIV-1 RNA ULTRAQUANT REFLEX TO GENTYP+
HIV 1 RNA Quant: 38 copies/mL — ABNORMAL HIGH (ref <20)
HIV-1 RNA Quant, Log: 1.58 Log copies/mL — ABNORMAL HIGH (ref <1.30)

## 2015-12-10 LAB — HEPATITIS B SURFACE ANTIGEN: Hepatitis B Surface Ag: NEGATIVE

## 2015-12-10 LAB — URINE CYTOLOGY ANCILLARY ONLY
Chlamydia: NEGATIVE
Neisseria Gonorrhea: NEGATIVE

## 2015-12-10 LAB — HEPATITIS A ANTIBODY, TOTAL: Hep A Total Ab: REACTIVE — AB

## 2015-12-10 LAB — I-STAT BETA HCG BLOOD, ED (MC, WL, AP ONLY): I-stat hCG, quantitative: <5 m[IU]/mL (ref <5)

## 2015-12-10 LAB — HEPATITIS B CORE ANTIBODY, TOTAL: Hep B Core Total Ab: NONREACTIVE

## 2015-12-10 LAB — T-HELPER CELL (CD4) - (RCID CLINIC ONLY)
CD4 % Helper T Cell: 45 % (ref 33–55)
CD4 T Cell Abs: 570 /uL (ref 400–2700)

## 2015-12-10 MED ORDER — KETOROLAC TROMETHAMINE 30 MG/ML IJ SOLN
30.0000 mg | Freq: Once | INTRAMUSCULAR | Status: AC
  Administered 2015-12-10: 30 mg via INTRAVENOUS
  Filled 2015-12-10: qty 1

## 2015-12-10 MED ORDER — PROCHLORPERAZINE EDISYLATE 5 MG/ML IJ SOLN
10.0000 mg | Freq: Once | INTRAMUSCULAR | Status: AC
  Administered 2015-12-10: 10 mg via INTRAVENOUS
  Filled 2015-12-10: qty 2

## 2015-12-10 MED ORDER — LORAZEPAM 2 MG/ML IJ SOLN
0.5000 mg | Freq: Once | INTRAMUSCULAR | Status: AC
  Administered 2015-12-10: 0.5 mg via INTRAVENOUS
  Filled 2015-12-10: qty 1

## 2015-12-10 MED ORDER — DIPHENHYDRAMINE HCL 50 MG/ML IJ SOLN
25.0000 mg | Freq: Once | INTRAMUSCULAR | Status: AC
  Administered 2015-12-10: 25 mg via INTRAVENOUS
  Filled 2015-12-10: qty 1

## 2015-12-10 MED ORDER — SODIUM CHLORIDE 0.9 % IV BOLUS (SEPSIS)
1000.0000 mL | Freq: Once | INTRAVENOUS | Status: AC
  Administered 2015-12-10: 1000 mL via INTRAVENOUS

## 2015-12-10 NOTE — ED Notes (Signed)
Bed: [id number] Expected date:  Expected time:  Means of arrival:  Comments: Headache

## 2015-12-10 NOTE — Progress Notes (Signed)
Patient  Listed as having Medicaid insurance without a pcp.  Pcp listed on patients insurance card is located at the [facility].  EDCM spoke to patient at bedside.  She reports she is seen at [facility].  EDCM informed patient the pcp Assigned to her on her insurance card is her pcp.  [facility] informed patient, if she wishes to change her pcp listed on her Medicaid card, she will have to call the DSS.  [facility] provided patient with a list of pcps who accept Medicaid insurance in [place].  EDCM also provided patient with contact information to DSS and Medicaid transport.  Patient thankful for resources.  No further EDCM needs at this time.

## 2015-12-10 NOTE — Discharge Instructions (Signed)
You were seen and evaluated today for your headache. If your symptoms continue or worsen please return to the emergency department. Follow-up outpatient as soon as possible.  General Headache Without Cause A headache is pain or discomfort felt around the head or neck area. There are many causes and types of headaches. In some cases, the cause may not be found.  HOME CARE  Managing Pain  Take over-the-counter and prescription medicines only as told by your doctor.  Lie down in a dark, quiet room when you have a headache.  If directed, apply ice to the head and neck area:  Put ice in a plastic bag.  Place a towel between your skin and the bag.  Leave the ice on for 20 minutes, 2-3 times per day.  Use a heating pad or hot shower to apply heat to the head and neck area as told by your doctor.  Keep lights dim if bright lights bother you or make your headaches worse. Eating and Drinking  Eat meals on a regular schedule.  Lessen how much alcohol you drink.  Lessen how much caffeine you drink, or stop drinking caffeine. General Instructions  Keep all follow-up visits as told by your doctor. This is important.  Keep a journal to find out if certain things bring on headaches. For example, write down:  What you eat and drink.  How much sleep you get.  Any change to your diet or medicines.  Relax by getting a massage or doing other relaxing activities.  Lessen stress.  Sit up straight. Do not tighten (tense) your muscles.  Do not use tobacco products. This includes cigarettes, chewing tobacco, or e-cigarettes. If you need help quitting, ask your doctor.  Exercise regularly as told by your doctor.  Get enough sleep. This often means 7-9 hours of sleep. GET HELP IF:  Your symptoms are not helped by medicine.  You have a headache that feels different than the other headaches.  You feel sick to your stomach (nauseous) or you throw up (vomit).  You have a fever. GET HELP  RIGHT AWAY IF:   Your headache becomes really bad.  You keep throwing up.  You have a stiff neck.  You have trouble seeing.  You have trouble speaking.  You have pain in the eye or ear.  Your muscles are weak or you lose muscle control.  You lose your balance or have trouble walking.  You feel like you will pass out (faint) or you pass out.  You have confusion.   This information is not intended to replace advice given to you by your health care provider. Make sure you discuss any questions you have with your health care provider.   Document Released: 05/23/2008 Document Revised: 05/05/2015 Document Reviewed: 12/07/2014 Elsevier Interactive Patient Education [employer].

## 2015-12-10 NOTE — ED Notes (Signed)
Pt presents via EMS from home c/o 10/10 diffuse headache with associated nausea, dizziness, and light sensitivity.  No vomiting, fever, chills, or body aches reported.  Pt is alert and oriented but is a poor historian.  Hx HIV.

## 2015-12-10 NOTE — ED Provider Notes (Signed)
CSN: [id number]     Arrival date & time 12/10/15  1903 History   First MD Initiated Contact with Patient 12/10/15 1950     Chief Complaint  Patient presents with  . Nausea  . Headache     (Consider location/radiation/quality/duration/timing/severity/associated sxs/prior Treatment) HPI Comments: 45 year old female with history of HIV, anemia, migraines presents for headache. The patient reports that today she was exposed to cigarette smoke and that this will often trigger a headache. She reports this headache feels like her migraine although they're not usually quite so severe. She reports sensitivity to light as well as to noise. Also reports associated nausea. Denies any fevers or chills. Denies any neck pain. The patient reports that she is currently changing outpatient providers for her HIV because she can no longer take it all the way to [facility] for her appointments. She had a CD4 count drawn on the 13th that was 570.   Past Medical History  Diagnosis Date  . Anemia   . HIV (human immunodeficiency virus infection) ([place])   . Headache(784.0)   . Anxiety   . Infection     UTI  . Fibroid    Past Surgical History  Procedure Laterality Date  . Dilation and curettage of uterus    . Induced abortion    . Tubal ligation    . Hand tendon surgery      right thumb   Family History  Problem Relation Age of Onset  . Family history unknown: Yes   Social History  Substance Use Topics  . Smoking status: Current Some Day Smoker    Types: Cigarettes  . Smokeless tobacco: Never Used     Comment: quit 8 yrs  . Alcohol Use: Yes     Comment: hx of alcohol abuse- social drinker now   OB History    Gravida Para Term Preterm AB TAB SAB Ectopic Multiple Living   6 4 4  2 1 1   4      Review of Systems  Constitutional: Negative for fever, diaphoresis, appetite change and fatigue.  HENT: Negative for congestion, postnasal drip, rhinorrhea and sinus pressure.   Eyes: Positive for  photophobia. Negative for pain.  Cardiovascular: Negative for chest pain and palpitations.  Gastrointestinal: Positive for nausea. Negative for vomiting, abdominal pain and diarrhea.  Genitourinary: Negative for dysuria, urgency and flank pain.  Musculoskeletal: Negative for myalgias and back pain.  Skin: Negative for rash.  Neurological: Positive for headaches. Negative for dizziness, seizures, syncope, weakness, light-headedness and numbness.  Hematological: Does not bruise/bleed easily.      Allergies  Chocolate and Tomato  Home Medications   Prior to Admission medications   Medication Sig Start Date End Date Taking? Authorizing Provider  emtricitabine-tenofovir (TRUVADA) 200-300 MG tablet Take 1 tablet by mouth daily. 07/17/15  Yes Hannah Muthersbaugh, PA-C  PREZISTA 800 MG tablet Take 800 mg by mouth daily with breakfast.  12/07/15  Yes Historical Provider, MD  ritonavir (NORVIR) 100 MG capsule Take 1 capsule (100 mg total) by mouth daily. 07/17/15  Yes Hannah Muthersbaugh, PA-C  albuterol (PROVENTIL HFA;VENTOLIN HFA) 108 (90 BASE) MCG/ACT inhaler Inhale 2 puffs into the lungs 3 (three) times daily as needed for wheezing or shortness of breath.    Historical Provider, MD  azithromycin (ZITHROMAX) 250 MG tablet Take 1 tablet (250 mg total) by mouth daily. Take first 2 tablets together, then 1 every day until finished. Patient not taking: Reported on 10/02/2015 07/17/15   Jarrett Soho Muthersbaugh, PA-C  benzonatate (TESSALON) 100 MG capsule Take 2 capsules (200 mg total) by mouth 2 (two) times daily as needed for cough. Patient not taking: Reported on 10/02/2015 07/17/15   Jarrett Soho Muthersbaugh, PA-C  darunavir (PREZISTA) 600 MG tablet Take 1 tablet (600 mg total) by mouth daily. Patient not taking: Reported on 12/10/2015 07/17/15   Jarrett Soho Muthersbaugh, PA-C   BP 119/74 mmHg  Pulse 103  Temp(Src) 98.5 F (36.9 C) (Oral)  Resp 14  Ht 5\' 7"  (1.702 m)  Wt 150 lb (68.04 kg)  BMI 23.49 kg/m2   SpO2 100%  LMP 11/10/2015 (Approximate) Physical Exam  Constitutional: She is oriented to person, place, and time. She appears well-developed and well-nourished. No distress.  Appears uncomfortable but nontoxic.  HENT:  Head: Normocephalic and atraumatic.  Right Ear: External ear normal.  Left Ear: External ear normal.  Nose: Nose normal.  Mouth/Throat: Oropharynx is clear and moist. No oropharyngeal exudate.  Eyes: EOM are normal. Pupils are equal, round, and reactive to light.  Neck: Normal range of motion and full passive range of motion without pain. Neck supple. No spinous process tenderness and no muscular tenderness present. Normal range of motion present.  Cardiovascular: Regular rhythm, normal heart sounds and intact distal pulses.  Tachycardia present.   No murmur heard. Pulmonary/Chest: Effort normal. No respiratory distress. She has no wheezes. She has no rales.  Abdominal: Soft. She exhibits no distension. There is no tenderness.  Musculoskeletal: Normal range of motion. She exhibits no edema or tenderness.  Neurological: She is alert and oriented to person, place, and time. No cranial nerve deficit. She exhibits normal muscle tone.  Skin: Skin is warm and dry. No rash noted. She is not diaphoretic.  Vitals reviewed.   ED Course  Procedures (including critical care time) Labs Review Labs Reviewed  CBC WITH DIFFERENTIAL/PLATELET - Abnormal; Notable for the following:    RBC 3.49 (*)    Hemoglobin 8.2 (*)    HCT 26.2 (*)    MCV 75.1 (*)    MCH 23.5 (*)    All other components within normal limits  BASIC METABOLIC PANEL - Abnormal; Notable for the following:    Glucose, Bld 102 (*)    All other components within normal limits  I-STAT BETA HCG BLOOD, ED ([facility], [facility], [facility] ONLY)    Imaging Review No results found. I have personally reviewed and evaluated these images and lab results as part of my medical decision-making.   EKG Interpretation None      MDM  Patient  was seen and evaluated at bedside. Recent laboratory studies from outpatient reviewed. CBC with continued anemia but stable. Other labs unremarkable. No meningeal signs on exam. Normal neurologic examination. Patient was given IV fluids, Toradol, Benadryl, Compazine. She then felt very anxious and was given Ativan and additional Benadryl. On reevaluation she said she felt much better and was asking for discharge. She still appeared somewhat uncomfortable but was adamant that she felt better and wanted to be discharged. She was discharged home in stable condition with strict Final diagnoses:  Headache, unspecified headache type    1. Headache    Harvel Quale, MD 12/11/15 [id number]

## 2015-12-10 NOTE — ED Notes (Signed)
Pt states that her headache has resolved and she is ready to go home.  MD notified.

## 2015-12-11 LAB — QUANTIFERON TB GOLD ASSAY (BLOOD)
Interferon Gamma Release Assay: NEGATIVE
Mitogen-Nil: 4.28 IU/mL
Quantiferon Nil Value: 0.03 IU/mL
Quantiferon Tb Ag Minus Nil Value: 0.01 IU/mL

## 2015-12-15 ENCOUNTER — Encounter: Payer: Medicaid Other | Admitting: Obstetrics & Gynecology

## 2015-12-16 ENCOUNTER — Other Ambulatory Visit: Payer: Medicaid Other

## 2015-12-16 LAB — HLA B*5701: HLA-B*5701 w/rflx HLA-B High: NEGATIVE

## 2016-01-03 ENCOUNTER — Ambulatory Visit: Payer: Medicaid Other | Admitting: *Deleted

## 2016-01-03 ENCOUNTER — Encounter: Payer: Self-pay | Admitting: Internal Medicine

## 2016-01-03 ENCOUNTER — Ambulatory Visit (INDEPENDENT_AMBULATORY_CARE_PROVIDER_SITE_OTHER): Payer: Medicaid Other | Admitting: Internal Medicine

## 2016-01-03 VITALS — BP 121/75 | HR 90 | Temp 98.0°F | Ht 67.0 in | Wt 137.0 lb

## 2016-01-03 DIAGNOSIS — J4520 Mild intermittent asthma, uncomplicated: Secondary | ICD-10-CM

## 2016-01-03 DIAGNOSIS — B20 Human immunodeficiency virus [HIV] disease: Secondary | ICD-10-CM

## 2016-01-03 DIAGNOSIS — F4322 Adjustment disorder with anxiety: Secondary | ICD-10-CM

## 2016-01-03 MED ORDER — EMTRICITABINE-TENOFOVIR DF 200-300 MG PO TABS: 1.0000 | ORAL_TABLET | Freq: Every day | ORAL | Status: DC

## 2016-01-03 MED ORDER — PREZISTA 800 MG PO TABS: 800.0000 mg | ORAL_TABLET | Freq: Every day | ORAL | Status: DC

## 2016-01-03 MED ORDER — ALBUTEROL SULFATE HFA 108 (90 BASE) MCG/ACT IN AERS: 2.0000 | INHALATION_SPRAY | Freq: Three times a day (TID) | RESPIRATORY_TRACT | Status: DC | PRN

## 2016-01-03 MED ORDER — RITONAVIR 100 MG PO CAPS: 100.0000 mg | ORAL_CAPSULE | Freq: Every day | ORAL | Status: DC

## 2016-01-03 NOTE — Progress Notes (Signed)
Patient ID: Dawn [name], female   DOB: 04-25-1971, 45 y.o.   MRN: [id number]       Patient ID: Dawn [name], female   DOB: 02-Oct-1970, 45 y.o.   MRN: [id number]  HPI Dawn [name] is a 45yo F with HIV disease, CD 4 count of 570/VL 43, on truvada-DRVr. Doing well with adherence. Diagnosed with HIV in 1997. cD 4 count naidr of 260. She previously was getting care at [facility] but now moved to [place] area and can come to get care more reliably in [place] since she has transportation difficulties. She is also trying to find housing. On wait list in [place].  She has occasional asthma, needs refills on albuterol inhaler.  Outpatient Encounter Prescriptions as of 01/03/2016  Medication Sig  . emtricitabine-tenofovir (TRUVADA) 200-300 MG tablet Take 1 tablet by mouth daily.  Marland Kitchen PREZISTA 800 MG tablet Take 800 mg by mouth daily with breakfast.   . ritonavir (NORVIR) 100 MG capsule Take 1 capsule (100 mg total) by mouth daily.  . [DISCONTINUED] darunavir (PREZISTA) 600 MG tablet Take 1 tablet (600 mg total) by mouth daily.  Marland Kitchen albuterol (PROVENTIL HFA;VENTOLIN HFA) 108 (90 BASE) MCG/ACT inhaler Inhale 2 puffs into the lungs 3 (three) times daily as needed for wheezing or shortness of breath. Reported on 01/03/2016  . [DISCONTINUED] azithromycin (ZITHROMAX) 250 MG tablet Take 1 tablet (250 mg total) by mouth daily. Take first 2 tablets together, then 1 every day until finished. (Patient not taking: Reported on 10/02/2015)  . [DISCONTINUED] benzonatate (TESSALON) 100 MG capsule Take 2 capsules (200 mg total) by mouth 2 (two) times daily as needed for cough. (Patient not taking: Reported on 10/02/2015)   No facility-administered encounter medications on file as of 01/03/2016.     Patient Active Problem List   Diagnosis Date Noted  . Acquired immunodeficiency syndrome ([place]) 12/07/2015  . Acne vulgaris 12/07/2015  . Absolute anemia 12/07/2015  . Episodic mood disorder ([place]) 12/07/2015   soch x: Nonsmoker, rare ETOH,  MJ every week. No history of IV drugs.  Lives in a boarding house in [place]. Family history is unknown by patient.    Health Maintenance Due  Topic Date Due  . TETANUS/TDAP  04/13/1992  . PAP SMEAR  04/13/1994     Review of Systems  Constitutional: Negative for fever, chills, diaphoresis, activity change, appetite change, fatigue and unexpected weight change.  HENT: Negative for congestion, sore throat, rhinorrhea, sneezing, trouble swallowing and sinus pressure.  Eyes: Negative for photophobia and visual disturbance.  Respiratory: Negative for cough, chest tightness, shortness of breath, wheezing and stridor.  Cardiovascular: Negative for chest pain, palpitations and leg swelling.  Gastrointestinal: Negative for nausea, vomiting, abdominal pain, diarrhea, constipation, blood in stool, abdominal distention and anal bleeding.  Genitourinary: Negative for dysuria, hematuria, flank pain and difficulty urinating.  Musculoskeletal: Negative for myalgias, back pain, joint swelling, arthralgias and gait problem.  Skin: Negative for color change, pallor, rash and wound.  Neurological: Negative for dizziness, tremors, weakness and light-headedness.  Hematological: Negative for adenopathy. Does not bruise/bleed easily.  Psychiatric/Behavioral: Negative for behavioral problems, confusion, sleep disturbance, dysphoric mood, decreased concentration and agitation.    Physical Exam   BP 121/75 mmHg  Pulse 90  Temp(Src) 98 F (36.7 C) (Oral)  Ht 5\' 7"  (1.702 m)  Wt 137 lb (62.143 kg)  BMI 21.45 kg/m2  LMP 12/11/2015 Physical Exam  Constitutional:  oriented to person, place, and time. appears well-developed and well-nourished. No distress.  HENT: [place]/AT, PERRLA, no  scleral icterus. Poor dentition Mouth/Throat: Oropharynx is clear and moist. No oropharyngeal exudate.  Cardiovascular: Normal rate, regular rhythm and normal heart sounds. Exam reveals no gallop and no friction rub.  No  murmur heard.  Pulmonary/Chest: Effort normal and breath sounds normal. No respiratory distress.  has no wheezes.  Neck = supple, no nuchal rigidity Abdominal: Soft. Bowel sounds are normal.  exhibits no distension. There is no tenderness.  Lymphadenopathy: no cervical adenopathy. No axillary adenopathy Neurological: alert and oriented to person, place, and time.  Skin: Skin is warm and dry. No rash noted. No erythema. Scattered acne to face Psychiatric: a normal mood and affect.  behavior is normal.   Lab Results  Component Value Date   CD4TCELL 45 12/09/2015   Lab Results  Component Value Date   CD4TABS 570 12/09/2015   Lab Results  Component Value Date   HIV1RNAQUANT 38* 12/09/2015   Lab Results  Component Value Date   HEPBSAB POS* 12/09/2015   No results found for: RPR  CBC Lab Results  Component Value Date   WBC 5.5 12/10/2015   RBC 3.49* 12/10/2015   HGB 8.2* 12/10/2015   HCT 26.2* 12/10/2015   PLT 364 12/10/2015   MCV 75.1* 12/10/2015   MCH 23.5* 12/10/2015   MCHC 31.3 12/10/2015   RDW 15.3 12/10/2015   LYMPHSABS 0.8 12/10/2015   MONOABS 0.3 12/10/2015   EOSABS 0.0 12/10/2015   BASOSABS 0.0 12/10/2015   BMET Lab Results  Component Value Date   NA 141 12/10/2015   K 3.7 12/10/2015   CL 106 12/10/2015   CO2 24 12/10/2015   GLUCOSE 102* 12/10/2015   BUN 14 12/10/2015   CREATININE 0.83 12/10/2015   CALCIUM 9.7 12/10/2015   GFRNONAA >60 12/10/2015   GFRAA >60 12/10/2015     Assessment and Plan  hiv disease = will refill meds on truvada, darunavir nad ritonavir  Asthma= will refill inhaler  Mood disorder = presently not taking mood stabilizers. Also provided her with resources for counseling if needed  Coordination of care = will have her establish care iwht bridge counseling so that she can be familiar with services in the community. At next appt, will give contact with thp

## 2016-01-04 NOTE — BH Specialist Note (Signed)
Counselor met with Dawn [name] in the exam room today in order to share with her as a new patient about the counseling services offered with her treatment.  Patient was oriented times four with good affect and dress.  Patient was alert and somewhat talkative. Counselor communicated that she could make any appointment with counselor if she needed to now or in the future.  Counselor let patient know that she is support and could utilize counseling services as needed. Counselor provided patient support and encouragement accordingly.   Rolena Infante, MA, LPC [facility]

## 2016-01-14 ENCOUNTER — Ambulatory Visit: Payer: Medicaid Other | Admitting: *Deleted

## 2016-02-03 ENCOUNTER — Encounter: Payer: Self-pay | Admitting: *Deleted

## 2016-02-07 ENCOUNTER — Ambulatory Visit: Payer: Medicaid Other | Admitting: Internal Medicine

## 2016-02-21 ENCOUNTER — Encounter (HOSPITAL_COMMUNITY): Payer: Self-pay | Admitting: Emergency Medicine

## 2016-02-21 ENCOUNTER — Emergency Department (HOSPITAL_COMMUNITY)
Admission: EM | Admit: 2016-02-21 | Discharge: 2016-02-22 | Disposition: A | Discharge status: Home / Self Care | Payer: Medicaid Other | Attending: Emergency Medicine | Admitting: Emergency Medicine

## 2016-02-21 DIAGNOSIS — Z79899 Other long term (current) drug therapy: Secondary | ICD-10-CM | POA: Diagnosis not present

## 2016-02-21 DIAGNOSIS — Z87891 Personal history of nicotine dependence: Secondary | ICD-10-CM | POA: Diagnosis not present

## 2016-02-21 DIAGNOSIS — L299 Pruritus, unspecified: Secondary | ICD-10-CM | POA: Insufficient documentation

## 2016-02-21 DIAGNOSIS — M79601 Pain in right arm: Secondary | ICD-10-CM | POA: Diagnosis present

## 2016-02-21 MED ORDER — DIPHENHYDRAMINE HCL 25 MG PO CAPS
25.0000 mg | ORAL_CAPSULE | Freq: Once | ORAL | Status: AC
  Administered 2016-02-22: 25 mg via ORAL
  Filled 2016-02-21: qty 1

## 2016-02-21 MED ORDER — FAMOTIDINE 20 MG PO TABS
20.0000 mg | ORAL_TABLET | Freq: Once | ORAL | Status: AC
  Administered 2016-02-22: 20 mg via ORAL
  Filled 2016-02-21: qty 1

## 2016-02-21 NOTE — ED Provider Notes (Signed)
CSN: [id number]     Arrival date & time 02/21/16  1945 History  By signing my name below, I, Dawn [name], attest that this documentation has been prepared under the direction and in the presence of [facility], PA-C.   Electronically Signed: Reola [name], ED Scribe. 02/21/2016. 11:42 PM.   Chief Complaint  Patient presents with  . Arm Pain   The history is provided by the patient. No language interpreter was used.   HPI Comments: Dawn [name] is a 45 y.o. female with a PMHx significant for anemia and HIV who presents to the Emergency Department complaining of gradual onset, gradually worsening, constant, 7/10 right upper arm pain s/p insect bite that occurred approx. 11 hours PTA. She also notes that the area has become pruritic. Pt reports that she does not know what type of insect bite her, but she states that her arm began to throb afterwards. She did not see any insects nor has she seen any insect bite marks on her skin. No erythema or skin changes. No OTC medications or home remedies tried PTA. Pt denies throat swelling or SOB.   Past Medical History  Diagnosis Date  . Anemia   . HIV (human immunodeficiency virus infection) ([place])   . Headache(784.0)   . Anxiety   . Infection     UTI  . Fibroid    Past Surgical History  Procedure Laterality Date  . Dilation and curettage of uterus    . Induced abortion    . Tubal ligation    . Hand tendon surgery      right thumb   Family History  Problem Relation Age of Onset  . Family history unknown: Yes   Social History  Substance Use Topics  . Smoking status: Former Smoker    Types: Cigarettes  . Smokeless tobacco: Never Used  . Alcohol Use: 0.0 oz/week    0 Standard drinks or equivalent per week     Comment: hx of alcohol abuse- social drinker now   OB History    Gravida Para Term Preterm AB TAB SAB Ectopic Multiple Living   6 4 4  2 1 1   4      Review of Systems  Constitutional: Negative for fever.   HENT:       Negative for throat swelling.  Respiratory: Negative for shortness of breath.    Allergies  Chocolate and Tomato  Home Medications   Prior to Admission medications   Medication Sig Start Date End Date Taking? Authorizing Provider  albuterol (PROVENTIL HFA;VENTOLIN HFA) 108 (90 Base) MCG/ACT inhaler Inhale 2 puffs into the lungs 3 (three) times daily as needed for wheezing or shortness of breath. Reported on 01/03/2016 01/03/16  Yes Carlyle Basques, MD  emtricitabine-tenofovir (TRUVADA) 200-300 MG tablet Take 1 tablet by mouth daily. 01/03/16  Yes Carlyle Basques, MD  PREZISTA 800 MG tablet Take 1 tablet (800 mg total) by mouth daily with breakfast. 01/03/16  Yes Carlyle Basques, MD  ritonavir (NORVIR) 100 MG capsule Take 1 capsule (100 mg total) by mouth daily. 01/03/16  Yes Carlyle Basques, MD   BP 147/80 mmHg  Pulse 94  Temp(Src) 99 F (37.2 C)  Resp 14  Ht 5\' 7"  (1.702 m)  Wt 61.321 kg  BMI 21.17 kg/m2  SpO2 100%   Physical Exam  Constitutional: She is oriented to person, place, and time. She appears well-developed and well-nourished.  HENT:  Head: Normocephalic and atraumatic.  Patent airway. No neck or facial  swelling.   Neck: Normal range of motion. Neck supple.  Cardiovascular: Normal rate, regular rhythm and normal heart sounds.   No murmur heard. Pulmonary/Chest: Effort normal. No respiratory distress.  Musculoskeletal: Normal range of motion.  Lymphadenopathy:    She has no cervical adenopathy.  Neurological: She is alert and oriented to person, place, and time.  Skin: Skin is warm and dry.  Normal skin exam with no erythema, ecchymosis, edema, or streaking. No signs of insect or tick bite, hives, or wheals. No apparent abnormalities.  Psychiatric: She has a normal mood and affect. Her behavior is normal.  Nursing note and vitals reviewed.  ED Course  Procedures (including critical care time)  DIAGNOSTIC STUDIES: Oxygen Saturation is 100% on RA, normal by my  interpretation.   COORDINATION OF CARE: 11:42 PM-Discussed next steps with pt including benedryl. Pt verbalized understanding and is agreeable with the plan.   MDM   Final diagnoses:  Itching   Dawn [name] presents to ED for itching of right upper arm. She believes that she was bitten by an insect, however she states she did not see any bugs and also states she does not see any bite marks on her arm. She admits to swelling of the area. On exam, there is no swelling or overlying skin changes noted. Normal skin and musculoskeletal exam. Given Benadryl and Pepcid in ED, patient noted improvement with these medications. Benadryl as needed for itching at home. PCP follow-up strongly recommended. Return precautions and given all questions answered.  I personally performed the services described in this documentation, which was scribed in my presence. The recorded information has been reviewed and is accurate.   [facility], PA-C 02/22/16 [place], DO 02/22/16 1500

## 2016-02-21 NOTE — ED Notes (Signed)
Patient states some sort of insect bit her right upper arm and now she is having pain in the right upper arm.  There is no area of bite at this time.

## 2016-02-22 NOTE — Discharge Instructions (Signed)
Take Benadryl as needed for itching. Return to ER for new or worsening symptoms, any additional concerns.

## 2016-03-09 ENCOUNTER — Encounter: Payer: Self-pay | Admitting: Internal Medicine

## 2016-03-09 ENCOUNTER — Telehealth: Payer: Self-pay | Admitting: Infectious Disease

## 2016-03-09 ENCOUNTER — Ambulatory Visit (INDEPENDENT_AMBULATORY_CARE_PROVIDER_SITE_OTHER): Payer: Medicaid Other | Admitting: Internal Medicine

## 2016-03-09 VITALS — BP 119/73 | HR 93 | Temp 97.6°F | Wt 133.0 lb

## 2016-03-09 DIAGNOSIS — B009 Herpesviral infection, unspecified: Secondary | ICD-10-CM | POA: Diagnosis not present

## 2016-03-09 DIAGNOSIS — B20 Human immunodeficiency virus [HIV] disease: Secondary | ICD-10-CM | POA: Diagnosis present

## 2016-03-09 LAB — CBC WITH DIFFERENTIAL/PLATELET
Basophils Absolute: 22 cells/uL (ref 0–200)
Basophils Relative: 1 %
Eosinophils Absolute: 44 cells/uL (ref 15–500)
Eosinophils Relative: 2 %
HCT: 21.3 % — ABNORMAL LOW (ref 35.0–45.0)
Hemoglobin: 6.1 g/dL — CL (ref 11.7–15.5)
Lymphocytes Relative: 35 %
Lymphs Abs: 770 cells/uL — ABNORMAL LOW (ref 850–3900)
MCH: 18.7 pg — ABNORMAL LOW (ref 27.0–33.0)
MCHC: 28.6 g/dL — ABNORMAL LOW (ref 32.0–36.0)
MCV: 65.1 fL — ABNORMAL LOW (ref 80.0–100.0)
MPV: 9.2 fL (ref 7.5–12.5)
Monocytes Absolute: 220 cells/uL (ref 200–950)
Monocytes Relative: 10 %
Neutro Abs: 1144 cells/uL — ABNORMAL LOW (ref 1500–7800)
Neutrophils Relative %: 52 %
Platelets: 393 10*3/uL (ref 140–400)
RBC: 3.27 MIL/uL — ABNORMAL LOW (ref 3.80–5.10)
RDW: 20.5 % — ABNORMAL HIGH (ref 11.0–15.0)
WBC: 2.2 10*3/uL — ABNORMAL LOW (ref 3.8–10.8)

## 2016-03-09 MED ORDER — EMTRICITABINE-TENOFOVIR AF 200-25 MG PO TABS: 1.0000 | ORAL_TABLET | Freq: Every day | ORAL | Status: DC

## 2016-03-09 MED ORDER — VALACYCLOVIR HCL 1 G PO TABS: 1000.0000 mg | ORAL_TABLET | Freq: Two times a day (BID) | ORAL | Status: DC

## 2016-03-09 MED ORDER — DARUNAVIR-COBICISTAT 800-150 MG PO TABS: 1.0000 | ORAL_TABLET | Freq: Every day | ORAL | Status: DC

## 2016-03-09 NOTE — Progress Notes (Signed)
Patient ID: Dawn [name], female   DOB: 1971-02-09, 45 y.o.   MRN: [id number]       Patient ID: Dawn [name], female   DOB: Sep 04, 1970, 45 y.o.   MRN: [id number]  HPI 45yo F with hiv disease, CD 4 count 570/VL 43, currently on truvada/DRVr. She states that she is doing well with adherence. She has noticed having small painful rash to her buttock that started roughly 3-4 days ago. She states that she gets this recurrent rashes predominantly on buttocks 3 x a year plus. No other areas involved at the moment. No other health complaints  Outpatient Encounter Prescriptions as of 03/09/2016  Medication Sig  . albuterol (PROVENTIL HFA;VENTOLIN HFA) 108 (90 Base) MCG/ACT inhaler Inhale 2 puffs into the lungs 3 (three) times daily as needed for wheezing or shortness of breath. Reported on 01/03/2016  . emtricitabine-tenofovir (TRUVADA) 200-300 MG tablet Take 1 tablet by mouth daily.  Marland Kitchen PREZISTA 800 MG tablet Take 1 tablet (800 mg total) by mouth daily with breakfast.  . ritonavir (NORVIR) 100 MG capsule Take 1 capsule (100 mg total) by mouth daily.   No facility-administered encounter medications on file as of 03/09/2016.     Patient Active Problem List   Diagnosis Date Noted  . Acquired immunodeficiency syndrome ([place]) 12/07/2015  . Acne vulgaris 12/07/2015  . Absolute anemia 12/07/2015  . Episodic mood disorder ([place]) 12/07/2015     Health Maintenance Due  Topic Date Due  . TETANUS/TDAP  04/13/1992  . PAP SMEAR  04/13/1994     Review of Systems Buttock rash. Otherwise 10 point ros Physical Exam   BP 119/73 mmHg  Pulse 93  Temp(Src) 97.6 F (36.4 C) (Oral)  Wt 133 lb (60.328 kg)  LMP 02/29/2016 Physical Exam  Constitutional:  oriented to person, place, and time. appears well-developed and well-nourished. No distress.  HENT: [place]/AT, PERRLA, no scleral icterus Mouth/Throat: Oropharynx is clear and moist. No oropharyngeal exudate.  Cardiovascular: Normal rate, regular rhythm and normal  heart sounds. Exam reveals no gallop and no friction rub.  No murmur heard.  Pulmonary/Chest: Effort normal and breath sounds normal. No respiratory distress.  has no wheezes.  Neck = supple, no nuchal rigidity Abdominal: Soft. Bowel sounds are normal.  exhibits no distension. There is no tenderness.  Lymphadenopathy: no cervical adenopathy. No axillary adenopathy Neurological: alert and oriented to person, place, and time.  Skin: Skin is warm and dry. Small cluster of 10 pinpoint vesicles to right buttock, not draining,. No erythema.  Psychiatric: a normal mood and affect.  behavior is normal.   Lab Results  Component Value Date   CD4TCELL 45 12/09/2015   Lab Results  Component Value Date   CD4TABS 570 12/09/2015   Lab Results  Component Value Date   HIV1RNAQUANT 38* 12/09/2015   Lab Results  Component Value Date   HEPBSAB POS* 12/09/2015   No results found for: RPR  CBC Lab Results  Component Value Date   WBC 5.5 12/10/2015   RBC 3.49* 12/10/2015   HGB 8.2* 12/10/2015   HCT 26.2* 12/10/2015   PLT 364 12/10/2015   MCV 75.1* 12/10/2015   MCH 23.5* 12/10/2015   MCHC 31.3 12/10/2015   RDW 15.3 12/10/2015   LYMPHSABS 0.8 12/10/2015   MONOABS 0.3 12/10/2015   EOSABS 0.0 12/10/2015   BASOSABS 0.0 12/10/2015   BMET Lab Results  Component Value Date   NA 141 12/10/2015   K 3.7 12/10/2015   CL 106 12/10/2015   CO2 24  12/10/2015   GLUCOSE 102* 12/10/2015   BUN 14 12/10/2015   CREATININE 0.83 12/10/2015   CALCIUM 9.7 12/10/2015   GFRNONAA >60 12/10/2015   GFRAA >60 12/10/2015     Assessment and Plan   hiv disease = will check cd 4 count and viral load. Will change to prezcobix and descovy to simplify her regimen/reduce pill burden  Herpes rash on buttock = will do valtrex 1gm bid x 7 days then reduce to once a day  Continue bridge counseling with mitch  rtc in 6-8wk to see how she is doing on her new regimen

## 2016-03-09 NOTE — Telephone Encounter (Signed)
I received a call re a critical lab from [facility] with hgb of 6.1  CBC Latest Ref Rng 03/09/2016 12/10/2015 12/09/2015  WBC 3.8 - 10.8 K/uL 2.2(L) 5.5 2.8(L)  Hemoglobin 11.7 - 15.5 g/dL 6.1(LL) 8.2(L) 7.8(L)  Hematocrit 35.0 - 45.0 % 21.3(L) 26.2(L) 25.4(L)  Platelets 140 - 400 K/uL [phone]    I left a message with Zehra on phone # given to call clinic back in the am and that she had a lab I was called about.   May want to repeat hgb tomorrow see if she has any symptoms of anemia prior to going down transfusion route she is below 7

## 2016-03-10 LAB — COMPLETE METABOLIC PANEL WITH GFR
ALT: 10 U/L (ref 6–29)
AST: 17 U/L (ref 10–30)
Albumin: 3.9 g/dL (ref 3.6–5.1)
Alkaline Phosphatase: 50 U/L (ref 33–115)
BUN: 11 mg/dL (ref 7–25)
CO2: 23 mmol/L (ref 20–31)
Calcium: 8.6 mg/dL (ref 8.6–10.2)
Chloride: 106 mmol/L (ref 98–110)
Creat: 0.86 mg/dL (ref 0.50–1.10)
GFR, Est African American: >89 mL/min (ref >=60)
GFR, Est Non African American: 84 mL/min (ref >=60)
Glucose, Bld: 76 mg/dL (ref 65–99)
Potassium: 3.8 mmol/L (ref 3.5–5.3)
Sodium: 138 mmol/L (ref 135–146)
Total Bilirubin: 0.3 mg/dL (ref 0.2–1.2)
Total Protein: 6.9 g/dL (ref 6.1–8.1)

## 2016-03-10 LAB — T-HELPER CELL (CD4) - (RCID CLINIC ONLY)
CD4 % Helper T Cell: 41 % (ref 33–55)
CD4 T Cell Abs: 320 /uL — ABNORMAL LOW (ref 400–2700)

## 2016-03-10 NOTE — Telephone Encounter (Signed)
It should be Dr. Storm Frisk call on that since patient is hers and she knows her better. I would say yes Monday is ok but we should make sure is  Not having symptoms

## 2016-03-10 NOTE — Telephone Encounter (Signed)
Dr. Tommy Medal, the [facility] is closed today.  Do you want her to come Monday, July 17th?  Please advise.

## 2016-03-11 NOTE — Telephone Encounter (Signed)
Can you have mitch get Ritta to come to clinic for stat repeat cbc, as well as do type and screen on Monday. She was asymptomatic during visit but her hemoglobin is low. If repeat is still below 7, recommend to transfuse 1 unit of rbc through sickle cell clinic. i will copy dr. Megan Salon on this as well since i am signing out my box

## 2016-03-13 ENCOUNTER — Telehealth: Payer: Self-pay | Admitting: *Deleted

## 2016-03-13 ENCOUNTER — Other Ambulatory Visit: Payer: Medicaid Other

## 2016-03-13 ENCOUNTER — Other Ambulatory Visit: Payer: Self-pay | Admitting: *Deleted

## 2016-03-13 ENCOUNTER — Observation Stay (HOSPITAL_COMMUNITY)
Admission: EM | Admit: 2016-03-13 | Discharge: 2016-03-14 | Disposition: A | Discharge status: Home / Self Care | Payer: Medicaid Other | Attending: Family Medicine | Admitting: Family Medicine

## 2016-03-13 ENCOUNTER — Encounter (HOSPITAL_COMMUNITY): Payer: Self-pay | Admitting: Emergency Medicine

## 2016-03-13 DIAGNOSIS — B029 Zoster without complications: Secondary | ICD-10-CM | POA: Diagnosis not present

## 2016-03-13 DIAGNOSIS — D649 Anemia, unspecified: Secondary | ICD-10-CM | POA: Diagnosis present

## 2016-03-13 DIAGNOSIS — E876 Hypokalemia: Secondary | ICD-10-CM

## 2016-03-13 DIAGNOSIS — Z87891 Personal history of nicotine dependence: Secondary | ICD-10-CM | POA: Diagnosis not present

## 2016-03-13 DIAGNOSIS — B20 Human immunodeficiency virus [HIV] disease: Secondary | ICD-10-CM | POA: Insufficient documentation

## 2016-03-13 DIAGNOSIS — Z91018 Allergy to other foods: Secondary | ICD-10-CM | POA: Insufficient documentation

## 2016-03-13 DIAGNOSIS — F419 Anxiety disorder, unspecified: Secondary | ICD-10-CM | POA: Diagnosis not present

## 2016-03-13 DIAGNOSIS — Z79899 Other long term (current) drug therapy: Secondary | ICD-10-CM | POA: Insufficient documentation

## 2016-03-13 DIAGNOSIS — D500 Iron deficiency anemia secondary to blood loss (chronic): Secondary | ICD-10-CM | POA: Diagnosis not present

## 2016-03-13 DIAGNOSIS — N920 Excessive and frequent menstruation with regular cycle: Secondary | ICD-10-CM | POA: Insufficient documentation

## 2016-03-13 DIAGNOSIS — J4520 Mild intermittent asthma, uncomplicated: Secondary | ICD-10-CM | POA: Insufficient documentation

## 2016-03-13 LAB — RETICULOCYTES
RBC.: 3.37 MIL/uL — ABNORMAL LOW (ref 3.87–5.11)
Retic Count, Absolute: 33.7 10*3/uL (ref 19.0–186.0)
Retic Ct Pct: 1 % (ref 0.4–3.1)

## 2016-03-13 LAB — CBC
HCT: 19.8 % — ABNORMAL LOW (ref 36.0–46.0)
HCT: 20.3 % — ABNORMAL LOW (ref 36.0–46.0)
Hemoglobin: 5.7 g/dL — CL (ref 12.0–15.0)
Hemoglobin: 5.8 g/dL — CL (ref 12.0–15.0)
MCH: 18.4 pg — ABNORMAL LOW (ref 26.0–34.0)
MCH: 18.2 pg — ABNORMAL LOW (ref 26.0–34.0)
MCHC: 28.8 g/dL — ABNORMAL LOW (ref 30.0–36.0)
MCHC: 28.6 g/dL — ABNORMAL LOW (ref 30.0–36.0)
MCV: 63.9 fL — ABNORMAL LOW (ref 78.0–100.0)
MCV: 63.8 fL — ABNORMAL LOW (ref 78.0–100.0)
Platelets: 339 10*3/uL (ref 150–400)
Platelets: 349 10*3/uL (ref 150–400)
RBC: 3.1 MIL/uL — ABNORMAL LOW (ref 3.87–5.11)
RBC: 3.18 MIL/uL — ABNORMAL LOW (ref 3.87–5.11)
RDW: 20.4 % — ABNORMAL HIGH (ref 11.5–15.5)
RDW: 20.5 % — ABNORMAL HIGH (ref 11.5–15.5)
WBC: 2.8 10*3/uL — ABNORMAL LOW (ref 4.0–10.5)
WBC: 2.8 10*3/uL — ABNORMAL LOW (ref 4.0–10.5)

## 2016-03-13 LAB — URINALYSIS, ROUTINE W REFLEX MICROSCOPIC
Bilirubin Urine: NEGATIVE
Glucose, UA: NEGATIVE mg/dL
Hgb urine dipstick: NEGATIVE
Ketones, ur: 15 mg/dL — AB
Leukocytes, UA: NEGATIVE
Nitrite: NEGATIVE
Protein, ur: NEGATIVE mg/dL
Specific Gravity, Urine: 1.036 — ABNORMAL HIGH (ref 1.005–1.030)
pH: 6 (ref 5.0–8.0)

## 2016-03-13 LAB — COMPREHENSIVE METABOLIC PANEL
ALT: 13 U/L — ABNORMAL LOW (ref 14–54)
AST: 22 U/L (ref 15–41)
Albumin: 3.9 g/dL (ref 3.5–5.0)
Alkaline Phosphatase: 45 U/L (ref 38–126)
Anion gap: 6 (ref 5–15)
BUN: 13 mg/dL (ref 6–20)
CO2: 24 mmol/L (ref 22–32)
Calcium: 9 mg/dL (ref 8.9–10.3)
Chloride: 107 mmol/L (ref 101–111)
Creatinine, Ser: 0.9 mg/dL (ref 0.44–1.00)
GFR calc Af Amer: >60 mL/min (ref >60)
GFR calc non Af Amer: >60 mL/min (ref >60)
Glucose, Bld: 96 mg/dL (ref 65–99)
Potassium: 3.1 mmol/L — ABNORMAL LOW (ref 3.5–5.1)
Sodium: 137 mmol/L (ref 135–145)
Total Bilirubin: 0.6 mg/dL (ref 0.3–1.2)
Total Protein: 7.4 g/dL (ref 6.5–8.1)

## 2016-03-13 LAB — VITAMIN B12: Vitamin B-12: 287 pg/mL (ref 180–914)

## 2016-03-13 LAB — URINE MICROSCOPIC-ADD ON

## 2016-03-13 LAB — PREPARE RBC (CROSSMATCH)

## 2016-03-13 LAB — ABO/RH: ABO/RH(D): B POS

## 2016-03-13 LAB — HIV-1 RNA QUANT-NO REFLEX-BLD
HIV 1 RNA Quant: <20 copies/mL (ref <20)
HIV-1 RNA Quant, Log: <1.3 Log copies/mL (ref <1.30)

## 2016-03-13 LAB — IRON AND TIBC
Iron: 9 ug/dL — ABNORMAL LOW (ref 28–170)
Saturation Ratios: 2 % — ABNORMAL LOW (ref 10.4–31.8)
TIBC: 433 ug/dL (ref 250–450)
UIBC: 424 ug/dL

## 2016-03-13 LAB — FOLATE: Folate: 10.8 ng/mL (ref >5.9)

## 2016-03-13 LAB — POC OCCULT BLOOD, ED: Fecal Occult Bld: NEGATIVE

## 2016-03-13 LAB — PREGNANCY, URINE: Preg Test, Ur: NEGATIVE

## 2016-03-13 LAB — FERRITIN: Ferritin: 4 ng/mL — ABNORMAL LOW (ref 11–307)

## 2016-03-13 MED ORDER — DARUNAVIR ETHANOLATE 800 MG PO TABS
800.0000 mg | ORAL_TABLET | Freq: Every day | ORAL | Status: DC
  Administered 2016-03-14: 800 mg via ORAL
  Filled 2016-03-13: qty 1

## 2016-03-13 MED ORDER — ALBUTEROL SULFATE (2.5 MG/3ML) 0.083% IN NEBU: 3.0000 mL | INHALATION_SOLUTION | Freq: Three times a day (TID) | RESPIRATORY_TRACT | Status: DC | PRN

## 2016-03-13 MED ORDER — RITONAVIR 100 MG PO TABS
100.0000 mg | ORAL_TABLET | Freq: Every day | ORAL | Status: DC
  Administered 2016-03-14: 100 mg via ORAL
  Filled 2016-03-13: qty 1

## 2016-03-13 MED ORDER — ENOXAPARIN SODIUM 40 MG/0.4ML ~~LOC~~ SOLN: 40.0000 mg | SUBCUTANEOUS | Status: DC

## 2016-03-13 MED ORDER — VALACYCLOVIR HCL 500 MG PO TABS: 1000.0000 mg | ORAL_TABLET | Freq: Every day | ORAL | Status: DC

## 2016-03-13 MED ORDER — SODIUM CHLORIDE 0.9 % IV SOLN: 10.0000 mL/h | Freq: Once | INTRAVENOUS | Status: DC

## 2016-03-13 MED ORDER — POTASSIUM CHLORIDE CRYS ER 20 MEQ PO TBCR
40.0000 meq | EXTENDED_RELEASE_TABLET | Freq: Once | ORAL | Status: AC
  Administered 2016-03-13: 40 meq via ORAL
  Filled 2016-03-13: qty 2

## 2016-03-13 MED ORDER — SODIUM CHLORIDE 0.9 % IV BOLUS (SEPSIS)
1000.0000 mL | Freq: Once | INTRAVENOUS | Status: AC
  Administered 2016-03-13: 1000 mL via INTRAVENOUS

## 2016-03-13 MED ORDER — EMTRICITABINE-TENOFOVIR AF 200-25 MG PO TABS
1.0000 | ORAL_TABLET | Freq: Every day | ORAL | Status: DC
  Administered 2016-03-14: 1 via ORAL
  Filled 2016-03-13: qty 1

## 2016-03-13 MED ORDER — VALACYCLOVIR HCL 500 MG PO TABS
1000.0000 mg | ORAL_TABLET | Freq: Two times a day (BID) | ORAL | Status: DC
  Administered 2016-03-14: 1000 mg via ORAL
  Filled 2016-03-13 (×2): qty 2

## 2016-03-13 MED ORDER — ACETAMINOPHEN 650 MG RE SUPP: 650.0000 mg | Freq: Four times a day (QID) | RECTAL | Status: DC | PRN

## 2016-03-13 MED ORDER — ACETAMINOPHEN 325 MG PO TABS
650.0000 mg | ORAL_TABLET | Freq: Four times a day (QID) | ORAL | Status: DC | PRN
Start: 2016-03-13 — End: 2016-03-14

## 2016-03-13 NOTE — Telephone Encounter (Signed)
See note from 03/13/16

## 2016-03-13 NOTE — ED Provider Notes (Signed)
CSN: [id number]     Arrival date & time 03/13/16  1348 History   First MD Initiated Contact with Patient 03/13/16 1541     Chief Complaint  Patient presents with  . Abnormal Lab     (Consider location/radiation/quality/duration/timing/severity/associated sxs/prior Treatment) HPI  Patient is a 45 year old female past medical history of anemia and HIV with CD4 of 570 and viral load 43 who presents the ED with complaint of low hemoglobin. Patient reports she was notified by her infectious disease physician this morning that her hemoglobin on her recent lab work was 5.7. Patient reports over the past 2 days she has had fatigue, lightheadedness, generalized weakness, intermittent shortness of breath with activity. Patient reports having similar symptoms in the past when she was anemic. She notes she has required transfusions in the past and states the last time she was transfused was over a year ago at [facility]. Pt denies any known source of bleeding. Patient also reports this morning she began having mild intermittent mid abdominal cramping which she states she has had in the past also when she has been anemic. Endorses associated nausea but notes her abdominal pain and nausea have resolved since arrival to the ED. Denies fever, chills, headache, visual changes, cough, hemoptysis, chest pain, vomiting, hematochezia, diarrhea, urinary symptoms, blood in urine or stool, vaginal bleeding, vaginal discharge, numbness, tingling, syncope. Patient notes she is followed by infectious disease and states she has been taking her HIV medications as prescribed. LMP 02/29/16.  Past Medical History  Diagnosis Date  . Anemia   . HIV (human immunodeficiency virus infection) ([place])   . Headache(784.0)   . Anxiety   . Infection     UTI  . Fibroid    Past Surgical History  Procedure Laterality Date  . Dilation and curettage of uterus    . Induced abortion    . Tubal ligation    . Hand tendon  surgery      right thumb   Family History  Problem Relation Age of Onset  . Family history unknown: Yes   Social History  Substance Use Topics  . Smoking status: Former Smoker    Types: Cigarettes  . Smokeless tobacco: Never Used  . Alcohol Use: 0.0 oz/week    0 Standard drinks or equivalent per week     Comment: hx of alcohol abuse- social drinker now   OB History    Gravida Para Term Preterm AB TAB SAB Ectopic Multiple Living   6 4 4  2 1 1   4      Review of Systems  Constitutional: Positive for fatigue.  Respiratory: Positive for shortness of breath.   Gastrointestinal: Positive for nausea and abdominal pain.  Neurological: Positive for weakness (generalized) and light-headedness.  All other systems reviewed and are negative.     Allergies  Chocolate and Tomato  Home Medications   Prior to Admission medications   Medication Sig Start Date End Date Taking? Authorizing Provider  albuterol (PROVENTIL HFA;VENTOLIN HFA) 108 (90 Base) MCG/ACT inhaler Inhale 2 puffs into the lungs 3 (three) times daily as needed for wheezing or shortness of breath. Reported on 01/03/2016 01/03/16  Yes Carlyle Basques, MD  Darunavir Ethanolate (PREZISTA) 800 MG tablet Take 800 mg by mouth daily with breakfast.   Yes Historical Provider, MD  emtricitabine-tenofovir (TRUVADA) 200-300 MG tablet Take 1 tablet by mouth daily.   Yes Historical Provider, MD  ritonavir (NORVIR) 100 MG TABS tablet Take 100 mg by mouth  daily with breakfast.   Yes Historical Provider, MD  valACYclovir (VALTREX) 1000 MG tablet Take 1 tablet (1,000 mg total) by mouth 2 (two) times daily. X 7 days, then take 1 tab daily 03/09/16  Yes Carlyle Basques, MD  darunavir-cobicistat (PREZCOBIX) 800-150 MG tablet Take 1 tablet by mouth daily. Swallow whole. Do NOT crush, break or chew tablets. Take with food. 03/09/16   Carlyle Basques, MD  emtricitabine-tenofovir AF (DESCOVY) 200-25 MG tablet Take 1 tablet by mouth daily. 03/09/16   Carlyle Basques, MD   BP 142/82 mmHg  Pulse 95  Temp(Src) 98.8 F (37.1 C) (Oral)  Resp 18  SpO2 100%  LMP 02/29/2016 Physical Exam  Constitutional: She is oriented to person, place, and time. She appears well-developed and well-nourished. No distress.  HENT:  Head: Normocephalic and atraumatic.  Mouth/Throat: Oropharynx is clear and moist. No oropharyngeal exudate.  Eyes: EOM are normal. Pupils are equal, round, and reactive to light. Right eye exhibits no discharge. Left eye exhibits no discharge. No scleral icterus.  Pale conjunctiva  Neck: Normal range of motion. Neck supple.  Cardiovascular: Normal rate, regular rhythm, normal heart sounds and intact distal pulses.   Pulmonary/Chest: Effort normal and breath sounds normal. No respiratory distress. She has no wheezes. She has no rales. She exhibits no tenderness.  Abdominal: Soft. Bowel sounds are normal. She exhibits no distension and no mass. There is no tenderness. There is no rebound and no guarding.  Genitourinary: Rectal exam shows external hemorrhoid (small nonthrombosed nontender hemorrhoid noted to 6 o'clock region). Rectal exam shows no internal hemorrhoid, no fissure, no mass, no tenderness and anal tone normal. Guaiac negative stool.  No gross blood noted on rectal exam  Musculoskeletal: Normal range of motion. She exhibits no edema.  Lymphadenopathy:    She has no cervical adenopathy.  Neurological: She is alert and oriented to person, place, and time.  Skin: Skin is warm and dry. She is not diaphoretic.  Few small scabbed lesions noted to left buttocks with excoriations present. No vesicles, pustules, bulla or drainage noted.   Nursing note and vitals reviewed.   ED Course  Procedures (including critical care time) Labs Review Labs Reviewed  COMPREHENSIVE METABOLIC PANEL - Abnormal; Notable for the following:    Potassium 3.1 (*)    ALT 13 (*)    All other components within normal limits  CBC - Abnormal; Notable for  the following:    WBC 2.8 (*)    RBC 3.18 (*)    Hemoglobin 5.8 (*)    HCT 20.3 (*)    MCV 63.8 (*)    MCH 18.2 (*)    MCHC 28.6 (*)    RDW 20.5 (*)    All other components within normal limits  LIPASE, BLOOD  URINALYSIS, ROUTINE W REFLEX MICROSCOPIC (NOT AT [facility])  PREGNANCY, URINE  VITAMIN B12  FOLATE  IRON AND TIBC  FERRITIN  RETICULOCYTES  POC OCCULT BLOOD, ED  TYPE AND SCREEN  ABO/RH  PREPARE RBC (CROSSMATCH)    Imaging Review No results found. I have personally reviewed and evaluated these images and lab results as part of my medical decision-making.   EKG Interpretation None      MDM   Final diagnoses:  Anemia, unspecified anemia type   Pt presents with sxs of fatigue, weakness, lightheadedness and SOB that started 2 days ago. She was seen by her infectious disease provider last week and was notified today that her hgb was 5.7. Pt denies any  known sources of bleeding. Hx of HIV, pt reports being compliant with meds, last CD4 570 and viral load 43. VSS. Exam revealed pale conjunctiva, abdominal exam benign, lungs CTAB. Remaining exam unremarkable. Pt able to stand and ambulate without assistance or ataxia noted. Rectal exam with no gross blood. Hemoccult negative. Repeat hgb 5.8. No evidence of active bleeding on exam. Will plan to transfuse pt in the ED (2 units) and admit. Consulted hospitalist. Dr. Allyson Sabal agrees to admission. Discussed results and plan for admission with pt.     Chesley Noon [place], [place] 03/13/16 [place] Liu, MD 03/14/16 Dyann Kief

## 2016-03-13 NOTE — Telephone Encounter (Signed)
Per Dr Maclaughlin Flattery called the patient and tried to get her to the clinic for repeat stat CBC. She did not answer the line and I left a message for the to call the clinic as soon as possible. Placed call to Mitch her case manager with [facility] and he advised he will also try to get her here as well. Advised if she does not want to come to clinic she could always go the the ED.

## 2016-03-13 NOTE — Telephone Encounter (Signed)
-----   Message from Carlyle Basques, MD sent at 03/13/2016  9:00 AM EDT ----- Can u guys call her to do stat cbc to check hgb result?

## 2016-03-13 NOTE — Telephone Encounter (Signed)
Received call from [facility] lab the patient Hemoglobin is 5.7 from 03/13/16 she is on the way to the hospital for a transfusion. Please advise if something different required.

## 2016-03-13 NOTE — ED Notes (Signed)
Spoke with Dr Oleta Mouse RE hgb 5.8.

## 2016-03-13 NOTE — ED Provider Notes (Signed)
Medical screening examination/treatment/procedure(s) were conducted as a shared visit with non-physician practitioner(s) and myself.  I personally evaluated the patient during the encounter.   EKG Interpretation None      45 year old female who presents with symptomatic anemia. She has a history of HIV with CD4 of 570 and viral load 43. History of anemia requiring blood transfusion in the past. States that she is unsure of what has caused her anemia, but states she has very heavy periods often lasting for 2 weeks. States yesterday began to feel fatigued and lightheaded with activity. No chest pain or difficulty breathing. Some mild abdominal cramping. Had routine blood work performed from infectious disease visit earlier this past week and was called in to the ED for hemoglobin of 5.7. No melena or hematochezia. Last menstrual period started on 02/29/2016. Repeat hemoglobin of 5.8. No evidence of active bleeding. Hemodynamically stable. Is well-appearing. We'll transfuse in the emergency department and plan to admit for observation.  Forde Dandy, MD 03/13/16 [phone]

## 2016-03-13 NOTE — ED Notes (Signed)
MD at bedside. 

## 2016-03-13 NOTE — ED Notes (Signed)
MD Hondgalgi at bedside, discussed blood transfusion orders with pt. Consent signed by pt. Report called to receiving Mendel Ryder, receiving nurse aware that transfusion has not been started; however, consent has been signed, MD is aware, and pt understands plan of care. Pt hemodynamically stable. VSS.

## 2016-03-13 NOTE — H&P (Signed)
History and Physical    Dawn [name] DOB: Oct 09, 1970 DOA: 03/13/2016   PCP: PROVIDER NOT IN SYSTEM  Outpatient Specialists:  Infectious disease: Dr. Carlyle Basques  Patient coming from: Home  Chief Complaint: Weakness, dizziness, low hemoglobin  HPI: Dawn [name] is a 45 y.o. female with PMH significant for HIV/AIDS on HAART, chronic anemia due to chronic menstrual blood loss and chronic disease, uterine fibroids, presented to [facility] ED on 03/13/16 with above complaints. She was advised by infectious disease clinic to have her labs drawn to check hemoglobin which returned 5.7 g per DL and she was advised to come to the ED for transfusion. Patient gives approximately 2 weeks history of progressive generalized weakness, easy fatigability, sleeping excessively, all symptoms seemed to have began around July 4. Yesterday she tried to move some furniture at home and noted that she just couldn't do it, felt dizzy, dyspnea on exertion without chest pain. She gives history of heavy menstrual bleeding since age 64. She has regular monthly menses but may have bleeding up to 2 weeks with clots. Has been told to have fibroids while she was in [place]. She gives history of pica (craves for Clay, dough and flour) Has grownup children but still desires to have kids. LMP July 4. Started on Valtrex for shingles on 7/13. Recently seen in ID clinic on 7/13.  ED Course: Hemoglobin 5.8, WBC 2.8, MCV 64, potassium 3.1.  Review of Systems:  All other systems reviewed and apart from HPI, are negative.  Past Medical History  Diagnosis Date  . Anemia   . HIV (human immunodeficiency virus infection) ([place])   . Headache(784.0)   . Anxiety   . Infection     UTI  . Fibroid     Past Surgical History  Procedure Laterality Date  . Dilation and curettage of uterus    . Induced abortion    . Tubal ligation    . Hand tendon surgery      right thumb   Social history  reports that she has quit  smoking. Her smoking use included Cigarettes. She has never used smokeless tobacco. She reports that she drinks alcohol. She reports that she uses illicit drugs (Marijuana).  Allergies  Allergen Reactions  . Chocolate Hives  . Tomato Hives    Family History  Problem Relation Age of Onset  . Family history unknown: Yes     Prior to Admission medications   Medication Sig Start Date End Date Taking? Authorizing Provider  albuterol (PROVENTIL HFA;VENTOLIN HFA) 108 (90 Base) MCG/ACT inhaler Inhale 2 puffs into the lungs 3 (three) times daily as needed for wheezing or shortness of breath. Reported on 01/03/2016 01/03/16  Yes Carlyle Basques, MD  Darunavir Ethanolate (PREZISTA) 800 MG tablet Take 800 mg by mouth daily with breakfast.   Yes Historical Provider, MD  emtricitabine-tenofovir (TRUVADA) 200-300 MG tablet Take 1 tablet by mouth daily.   Yes Historical Provider, MD  ritonavir (NORVIR) 100 MG TABS tablet Take 100 mg by mouth daily with breakfast.   Yes Historical Provider, MD  valACYclovir (VALTREX) 1000 MG tablet Take 1 tablet (1,000 mg total) by mouth 2 (two) times daily. X 7 days, then take 1 tab daily 03/09/16  Yes Carlyle Basques, MD    Physical Exam: Filed Vitals:   03/13/16 1650 03/13/16 1715 03/13/16 1737 03/13/16 1744  BP:   122/86 133/80  Pulse: 90 97 89 88  Temp:   98.4 F (36.9 C)   TempSrc:   Oral  Resp: 17  18 18   SpO2: 100% 100% 100% 98%      Constitutional: Pleasant young female, moderately built and nourished, lying comfortably supine in the gurney in the ED. Patient's female RN at bedside. Eyes: PERTLA, lids and conjunctivae normal ENMT: Mucous membranes are moist but pale. Posterior pharynx clear of any exudate or lesions. Normal dentition.  Neck: normal, supple, no masses, no thyromegaly Respiratory: clear to auscultation bilaterally, no wheezing, no crackles. Normal respiratory effort. No accessory muscle use.  Cardiovascular: S1 & S2 heard, regular rate and  rhythm, no murmurs / rubs / gallops. No extremity edema. 2+ pedal pulses. No carotid bruits.  Abdomen: No distension, no tenderness, no masses palpated. No hepatosplenomegaly. Bowel sounds normal.  Musculoskeletal: no clubbing / cyanosis. No joint deformity upper and lower extremities. Good ROM, no contractures. Normal muscle tone.  Skin: no rashes, lesions, ulcers. No induration (as per EDP exam, patient has scabbing shingles lesion over her buttock) Neurologic: CN 2-12 grossly intact. Sensation intact, DTR normal. Strength 5/5 in all 4 limbs.  Psychiatric: Normal judgment and insight. Alert and oriented x 3. Normal mood.     Labs on Admission: I have personally reviewed following labs and imaging studies  CBC:  Recent Labs Lab 03/09/16 1346 03/13/16 1100 03/13/16 1445  WBC 2.2* 2.8* 2.8*  NEUTROABS 1144*  --   --   HGB 6.1* 5.7* 5.8*  HCT 21.3* 19.8* 20.3*  MCV 65.1* 63.9* 63.8*  PLT 393 339 0000000   Basic Metabolic Panel:  Recent Labs Lab 03/09/16 1346 03/13/16 1445  NA 138 137  K 3.8 3.1*  CL 106 107  CO2 23 24  GLUCOSE 76 96  BUN 11 13  CREATININE 0.86 0.90  CALCIUM 8.6 9.0   GFR: Estimated Creatinine Clearance: 77.5 mL/min (by C-G formula based on Cr of 0.9). Liver Function Tests:  Recent Labs Lab 03/09/16 1346 03/13/16 1445  AST 17 22  ALT 10 13*  ALKPHOS 50 45  BILITOT 0.3 0.6  PROT 6.9 7.4  ALBUMIN 3.9 3.9   No results for input(s): LIPASE, AMYLASE in the last 168 hours. No results for input(s): AMMONIA in the last 168 hours. Coagulation Profile: No results for input(s): INR, PROTIME in the last 168 hours. Cardiac Enzymes: No results for input(s): CKTOTAL, CKMB, CKMBINDEX, TROPONINI in the last 168 hours. BNP (last 3 results) No results for input(s): PROBNP in the last 8760 hours. HbA1C: No results for input(s): HGBA1C in the last 72 hours. CBG: No results for input(s): GLUCAP in the last 168 hours. Lipid Profile: No results for input(s):  CHOL, HDL, LDLCALC, TRIG, CHOLHDL, LDLDIRECT in the last 72 hours. Thyroid Function Tests: No results for input(s): TSH, T4TOTAL, FREET4, T3FREE, THYROIDAB in the last 72 hours. Anemia Panel: No results for input(s): VITAMINB12, FOLATE, FERRITIN, TIBC, IRON, RETICCTPCT in the last 72 hours.   Radiological Exams on Admission: No results found.    Assessment/Plan Principal Problem:   Anemia due to chronic blood loss Active Problems:   Acquired immunodeficiency syndrome (HCC)     Severe microcytic anemia, likely iron deficiency related to chronic menstrual blood loss and of chronic disease - [place] anemia panel prior to transfusion - Transfuse 2 units of PRBCs. - Follow CBC in a.m. - Consider iron supplementation depending on anemia panel. - will need definitive treatment of her menorrhagia.  Menorrhagia - Possibly due to fibroids. - Advised outpatient GYN consultation ASAP to evaluate and address. She verbalized understanding.  AIDS - Continue retroviral  therapy.  Recent shingles - As per discussion with EDP, buttock lesions are scabbing and hence no isolation warranted. Continue Valtrex.  Hypokalemia - Replace and follow   DVT prophylaxis: Lovenox  Code Status: Full  Family Communication: Discussed with patient. No family at bedside.  Disposition Plan: DC home possibly 7/18.  Consults called: None  Admission status: Observation, medical bed.    [facility] MD [facility] Pager [phone]  If 7PM-7AM, please contact night-coverage [email or web address] Password [email or web address]  03/13/2016, 6:00 PM

## 2016-03-13 NOTE — ED Notes (Signed)
MD Hondgalgi at bedside assessing pt

## 2016-03-13 NOTE — Telephone Encounter (Signed)
Bridge Counselor would like to be called to assist if additional appointments need arranged.

## 2016-03-13 NOTE — ED Notes (Signed)
Admitting MD at bedside.

## 2016-03-13 NOTE — ED Notes (Signed)
Pt sent from infectious disease for hbg of 5.3 pt has history of HIV. Pt reports feeling fatigue and lightheaded since yesterday. Pt has history of blood transfusions.

## 2016-03-14 DIAGNOSIS — D500 Iron deficiency anemia secondary to blood loss (chronic): Principal | ICD-10-CM

## 2016-03-14 DIAGNOSIS — B20 Human immunodeficiency virus [HIV] disease: Secondary | ICD-10-CM | POA: Diagnosis not present

## 2016-03-14 LAB — CBC
HCT: 29.1 % — ABNORMAL LOW (ref 36.0–46.0)
HCT: 30.6 % — ABNORMAL LOW (ref 36.0–46.0)
Hemoglobin: 8.5 g/dL — ABNORMAL LOW (ref 12.0–15.0)
Hemoglobin: 9.4 g/dL — ABNORMAL LOW (ref 12.0–15.0)
MCH: 19.6 pg — ABNORMAL LOW (ref 26.0–34.0)
MCH: 20.9 pg — ABNORMAL LOW (ref 26.0–34.0)
MCHC: 29.2 g/dL — ABNORMAL LOW (ref 30.0–36.0)
MCHC: 30.7 g/dL (ref 30.0–36.0)
MCV: 67.1 fL — ABNORMAL LOW (ref 78.0–100.0)
MCV: 68.2 fL — ABNORMAL LOW (ref 78.0–100.0)
Platelets: 334 10*3/uL (ref 150–400)
Platelets: 297 10*3/uL (ref 150–400)
RBC: 4.34 MIL/uL (ref 3.87–5.11)
RBC: 4.49 MIL/uL (ref 3.87–5.11)
RDW: 20.2 % — ABNORMAL HIGH (ref 11.5–15.5)
RDW: 21.1 % — ABNORMAL HIGH (ref 11.5–15.5)
WBC: 3.7 10*3/uL — ABNORMAL LOW (ref 4.0–10.5)
WBC: 4 10*3/uL (ref 4.0–10.5)

## 2016-03-14 LAB — BASIC METABOLIC PANEL
Anion gap: 6 (ref 5–15)
BUN: 9 mg/dL (ref 6–20)
CO2: 23 mmol/L (ref 22–32)
Calcium: 8.9 mg/dL (ref 8.9–10.3)
Chloride: 109 mmol/L (ref 101–111)
Creatinine, Ser: 0.85 mg/dL (ref 0.44–1.00)
GFR calc Af Amer: >60 mL/min (ref >60)
GFR calc non Af Amer: >60 mL/min (ref >60)
Glucose, Bld: 89 mg/dL (ref 65–99)
Potassium: 3.8 mmol/L (ref 3.5–5.1)
Sodium: 138 mmol/L (ref 135–145)

## 2016-03-14 LAB — PREPARE RBC (CROSSMATCH)

## 2016-03-14 MED ORDER — FERROUS GLUCONATE 324 (38 FE) MG PO TABS
324.0000 mg | ORAL_TABLET | Freq: Two times a day (BID) | ORAL | Status: DC
  Filled 2016-03-14: qty 1

## 2016-03-14 MED ORDER — SODIUM CHLORIDE 0.9 % IV SOLN
Freq: Once | INTRAVENOUS | Status: AC
Start: 2016-03-14 — End: 2016-03-14
  Administered 2016-03-14: 13:00:00 via INTRAVENOUS

## 2016-03-14 MED ORDER — FERROUS GLUCONATE 324 (38 FE) MG PO TABS
324.0000 mg | ORAL_TABLET | Freq: Two times a day (BID) | ORAL | Status: DC
Start: 2016-03-14 — End: 2016-11-09

## 2016-03-14 NOTE — Progress Notes (Signed)
IV removed. Discharge paperwork given to patient. No questions verbalized.

## 2016-03-14 NOTE — Discharge Summary (Signed)
Physician Discharge Summary  Dawn Dawn [name] [id number] DOB: 1970/11/14 DOA: 03/13/2016  PCP: PROVIDER NOT IN SYSTEM  Admit date: 03/13/2016 Discharge date: 03/14/2016  Time spent: *25 minutes  Recommendations for Outpatient Follow-up:  1. Follow up PCP in  weeks   Discharge Diagnoses:  Principal Problem:   Anemia due to chronic blood loss Active Problems:   Acquired immunodeficiency syndrome [facility])   Discharge Condition: Stable  Diet recommendation: Regular diet  Filed Weights   03/14/16 0633  Weight: 61.553 kg (135 lb 11.2 oz)    History of present illness:  45 y.o. Dawn [name] with PMH significant for HIV/AIDS on HAART, chronic anemia due to chronic menstrual blood loss and chronic disease, uterine fibroids, presented to [facility] ED on 03/13/16 with above complaints. She was advised by infectious disease clinic to have her labs drawn to check hemoglobin which returned 5.7 g per DL and she was advised to come to the ED for transfusion. Patient gives approximately 2 weeks history of progressive generalized weakness, easy fatigability, sleeping excessively, all symptoms seemed to have began around July 4. Yesterday she tried to move some furniture at home and noted that she just couldn't do it, felt dizzy, dyspnea on exertion without chest pain. She gives history of heavy menstrual bleeding since age 34. She has regular monthly menses but may have bleeding up to 2 weeks with clots. Has been told to have fibroids while she was in [place]. She gives history of pica (craves for Clay, dough and flour) Has grownup children but still desires to have kids. LMP July 4. Started on Valtrex for shingles on 7/13. Recently seen in ID clinic on 7/13.  Hospital Course:   Severe microcytic anemia, likely iron deficiency related to chronic menstrual blood loss and of chronic disease -patient received 3 units PRBC Repeat hemoglobin is 9.4. - will give  iron supplementation  - will need definitive treatment of  her menorrhagia. - folow up GYN as outpatient.  Menorrhagia - Possibly due to fibroids. - Advised outpatient GYN consultation ASAP to evaluate and address.  AIDS - Continue retroviral therapy.  Recent shingles - As per discussion with EDP, buttock lesions are scabbing and hence no isolation warranted. Continue Valtrex.  Hypokalemia - Replaced  Procedures:  None   Consultations:  None   Discharge Exam: Filed Vitals:   03/14/16 1333 03/14/16 1359  BP: 138/82 130/72  Pulse: 74 70  Temp: 99.4 F (37.4 C) 99.3 F (37.4 C)  Resp: 16     General: Appears in no acute distress Cardiovascular: S1s2 RRR Respiratory: Clear bilaterally  Discharge Instructions   Discharge Instructions    Diet - low sodium heart healthy    Complete by:  As directed      Increase activity slowly    Complete by:  As directed           Current Discharge Medication List    START taking these medications   Details  ferrous gluconate (FERGON) 324 MG tablet Take 1 tablet (324 mg total) by mouth 2 (two) times daily with a meal. Qty: 30 tablet, Refills: 3      CONTINUE these medications which have NOT CHANGED   Details  albuterol (PROVENTIL HFA;VENTOLIN HFA) 108 (90 Base) MCG/ACT inhaler Inhale 2 puffs into the lungs 3 (three) times daily as needed for wheezing or shortness of breath. Reported on 01/03/2016 Qty: 1 Inhaler, Refills: 3   Associated Diagnoses: Asthma, mild intermittent, uncomplicated    Darunavir Ethanolate (PREZISTA) 800 MG tablet Take  800 mg by mouth daily with breakfast.    emtricitabine-tenofovir (TRUVADA) 200-300 MG tablet Take 1 tablet by mouth daily.    ritonavir (NORVIR) 100 MG TABS tablet Take 100 mg by mouth daily with breakfast.    valACYclovir (VALTREX) 1000 MG tablet Take 1 tablet (1,000 mg total) by mouth 2 (two) times daily. X 7 days, then take 1 tab daily Qty: 60 tablet, Refills: 0   Associated Diagnoses: Herpes infection       Allergies  Allergen  Reactions  . Chocolate Hives  . Tomato Hives      The results of significant diagnostics from this hospitalization (including imaging, microbiology, ancillary and laboratory) are listed below for reference.    Significant Diagnostic Studies: No results found.  Microbiology: No results found for this or any previous visit (from the past 240 hour(s)).   Labs: Basic Metabolic Panel:  Recent Labs Lab 03/09/16 1346 03/13/16 1445 03/14/16 0549  NA 138 137 138  K 3.8 3.1* 3.8  CL 106 107 109  CO2 23 24 23   GLUCOSE 76 96 89  BUN 11 13 9   CREATININE 0.86 0.90 0.85  CALCIUM 8.6 9.0 8.9   Liver Function Tests:  Recent Labs Lab 03/09/16 1346 03/13/16 1445  AST 17 22  ALT 10 13*  ALKPHOS 50 45  BILITOT 0.3 0.6  PROT 6.9 7.4  ALBUMIN 3.9 3.9   No results for input(s): LIPASE, AMYLASE in the last 168 hours. No results for input(s): AMMONIA in the last 168 hours. CBC:  Recent Labs Lab 03/09/16 1346 03/13/16 1100 03/13/16 1445 03/14/16 0549 03/14/16 1529  WBC 2.2* 2.8* 2.8* 3.7* 4.0  NEUTROABS 1144*  --   --   --   --   HGB 6.1* 5.7* 5.8* 8.5* 9.4*  HCT 21.3* 19.8* 20.3* 29.1* 30.6*  MCV 65.1* 63.9* 63.8* 67.1* 68.2*  PLT 393 339 349 334 297       Signed:  Jocie Meroney S MD.  [facility] 03/14/2016, 4:07 PM

## 2016-03-14 NOTE — Clinical Social Work Note (Signed)
CSW received request that patient needed a bus pass in order to get home from hospital.  CSW met with patient and she was appreciative of the bus pass to help her get home.  Patient expressed gratitude that the hospital helped get patient well.  CSW to sign off please reconsult if other social work needs arise.  Jones Broom. [place], MSW, [place] 03/14/2016 11:24 AM

## 2016-03-15 LAB — TYPE AND SCREEN
ABO/RH(D): B POS
Antibody Screen: NEGATIVE
Unit division: 0
Unit division: 0
Unit division: 0

## 2016-03-17 ENCOUNTER — Other Ambulatory Visit (HOSPITAL_COMMUNITY)
Admission: RE | Admit: 2016-03-17 | Discharge: 2016-03-17 | Disposition: A | Discharge status: Home / Self Care | Payer: Medicaid Other | Source: Ambulatory Visit | Attending: Internal Medicine | Admitting: Internal Medicine

## 2016-03-17 ENCOUNTER — Ambulatory Visit (INDEPENDENT_AMBULATORY_CARE_PROVIDER_SITE_OTHER): Payer: Medicaid Other | Admitting: *Deleted

## 2016-03-17 DIAGNOSIS — Z113 Encounter for screening for infections with a predominantly sexual mode of transmission: Secondary | ICD-10-CM | POA: Insufficient documentation

## 2016-03-17 DIAGNOSIS — Z124 Encounter for screening for malignant neoplasm of cervix: Secondary | ICD-10-CM

## 2016-03-17 DIAGNOSIS — Z1231 Encounter for screening mammogram for malignant neoplasm of breast: Secondary | ICD-10-CM

## 2016-03-17 DIAGNOSIS — Z01419 Encounter for gynecological examination (general) (routine) without abnormal findings: Secondary | ICD-10-CM | POA: Diagnosis present

## 2016-03-17 NOTE — Progress Notes (Signed)
  Subjective:     Dawn [name] is a 45 y.o. woman who comes in today for a  pap smear only.  Previous abnormal Pap smears: yes. Contraception: condoms   Objective:    LMP 02/29/2016 Pelvic Exam: . Pap smear obtained.   Assessment:    Screening pap smear.   Plan:    Follow up in one year, or as indicated by Pap results.   Pt given educational materials re: HIV and women, self-esteem, BSE, nutrition and diet management, PAP smears and partner safety. Pt given condoms

## 2016-03-17 NOTE — Patient Instructions (Signed)
Your results will be ready in about a week.  I will either send them to you via MyChart or by mail.  Thank you for coming to the Center for your care.  Langley Gauss, RN

## 2016-03-20 LAB — CERVICOVAGINAL ANCILLARY ONLY
Chlamydia: NEGATIVE
Neisseria Gonorrhea: NEGATIVE

## 2016-03-21 LAB — CYTOLOGY - PAP

## 2016-03-29 ENCOUNTER — Encounter: Payer: Self-pay | Admitting: *Deleted

## 2016-04-08 ENCOUNTER — Other Ambulatory Visit: Payer: Self-pay | Admitting: Internal Medicine

## 2016-04-08 DIAGNOSIS — B009 Herpesviral infection, unspecified: Secondary | ICD-10-CM

## 2016-04-20 ENCOUNTER — Ambulatory Visit (INDEPENDENT_AMBULATORY_CARE_PROVIDER_SITE_OTHER): Payer: Medicaid Other | Admitting: Internal Medicine

## 2016-04-20 VITALS — Temp 97.6°F | Wt 132.0 lb

## 2016-04-20 DIAGNOSIS — B20 Human immunodeficiency virus [HIV] disease: Secondary | ICD-10-CM | POA: Diagnosis present

## 2016-04-20 DIAGNOSIS — B009 Herpesviral infection, unspecified: Secondary | ICD-10-CM | POA: Diagnosis not present

## 2016-04-20 DIAGNOSIS — Z23 Encounter for immunization: Secondary | ICD-10-CM | POA: Diagnosis not present

## 2016-04-20 DIAGNOSIS — L0232 Furuncle of buttock: Secondary | ICD-10-CM

## 2016-04-20 MED ORDER — SULFAMETHOXAZOLE-TRIMETHOPRIM 800-160 MG PO TABS: 1.0000 | ORAL_TABLET | Freq: Two times a day (BID) | ORAL | 0 refills | Status: DC

## 2016-04-20 NOTE — Progress Notes (Signed)
Patient ID: Dawn [name], female   DOB: Sep 28, 1970, 45 y.o.   MRN: [id number]  HPI 320/VL<20 on descovy-prezcobix. Still taking meds as directed. Has furuncle on right buttock that is exquisitely painful. Otherwise is in good state of health.  Outpatient Encounter Prescriptions as of 04/20/2016  Medication Sig  . albuterol (PROVENTIL HFA;VENTOLIN HFA) 108 (90 Base) MCG/ACT inhaler Inhale 2 puffs into the lungs 3 (three) times daily as needed for wheezing or shortness of breath. Reported on 01/03/2016  . darunavir-cobicistat (PREZCOBIX) 800-150 MG tablet Take 1 tablet by mouth daily. Swallow whole. Do NOT crush, break or chew tablets. Take with food.  Marland Kitchen emtricitabine-tenofovir AF (DESCOVY) 200-25 MG tablet Take 1 tablet by mouth daily.  . ferrous gluconate (FERGON) 324 MG tablet Take 1 tablet (324 mg total) by mouth 2 (two) times daily with a meal.  . valACYclovir (VALTREX) 1000 MG tablet Take 1 tablet (1,000 mg total) by mouth daily.  . [DISCONTINUED] Darunavir Ethanolate (PREZISTA) 800 MG tablet Take 800 mg by mouth daily with breakfast.  . [DISCONTINUED] emtricitabine-tenofovir (TRUVADA) 200-300 MG tablet Take 1 tablet by mouth daily.  . [DISCONTINUED] ritonavir (NORVIR) 100 MG TABS tablet Take 100 mg by mouth daily with breakfast.   No facility-administered encounter medications on file as of 04/20/2016.      Patient Active Problem List   Diagnosis Date Noted  . Anemia due to chronic blood loss 03/13/2016  . Acquired immunodeficiency syndrome ([place]) 12/07/2015  . Acne vulgaris 12/07/2015  . Episodic mood disorder ([place]) 12/07/2015     Health Maintenance Due  Topic Date Due  . TETANUS/TDAP  04/13/1992  . INFLUENZA VACCINE  03/28/2016     Review of Systems Review of Systems  Constitutional: Negative for fever, chills, diaphoresis, activity change, appetite change, fatigue and unexpected weight change.  HENT: Negative for congestion, sore throat, rhinorrhea, sneezing, trouble  swallowing and sinus pressure.  Eyes: Negative for photophobia and visual disturbance.  Respiratory: Negative for cough, chest tightness, shortness of breath, wheezing and stridor.  Cardiovascular: Negative for chest pain, palpitations and leg swelling.  Gastrointestinal: Negative for nausea, vomiting, abdominal pain, diarrhea, constipation, blood in stool, abdominal distention and anal bleeding.  Genitourinary: Negative for dysuria, hematuria, flank pain and difficulty urinating.  Musculoskeletal: Negative for myalgias, back pain, joint swelling, arthralgias and gait problem.  Skin: rash on buttocks Neurological: Negative for dizziness, tremors, weakness and light-headedness.  Hematological: Negative for adenopathy. Does not bruise/bleed easily.  Psychiatric/Behavioral: Negative for behavioral problems, confusion, sleep disturbance, dysphoric mood, decreased concentration and agitation.    Physical Exam   Temp 97.6 F (36.4 C) (Oral)   Wt 132 lb (59.9 kg)   LMP 03/27/2016 (Approximate)   BMI 20.67 kg/m  Physical Exam  Constitutional:  oriented to person, place, and time. appears well-developed and well-nourished. No distress.  HENT: [place]/AT, PERRLA, no scleral icterus Mouth/Throat: Oropharynx is clear and moist. No oropharyngeal exudate.  Cardiovascular: Normal rate, regular rhythm and normal heart sounds. Exam reveals no gallop and no friction rub.  No murmur heard.  Pulmonary/Chest: Effort normal and breath sounds normal. No respiratory distress.  has no wheezes.  Neck = supple, no nuchal rigidity Abdominal: Soft. Bowel sounds are normal.  exhibits no distension. There is no tenderness.  Lymphadenopathy: no cervical adenopathy. No axillary adenopathy Neurological: alert and oriented to person, place, and time.  Skin: Skin is warm and dry. No rash noted. No erythema.  Psychiatric: a normal mood and affect.  behavior is normal.  Lab Results  Component Value Date   CD4TCELL 41  03/09/2016   Lab Results  Component Value Date   CD4TABS 320 (L) 03/09/2016   CD4TABS 570 12/09/2015   Lab Results  Component Value Date   HIV1RNAQUANT <20 03/09/2016   Lab Results  Component Value Date   HEPBSAB POS (A) 12/09/2015   No results found for: RPR  CBC Lab Results  Component Value Date   WBC 4.0 03/14/2016   RBC 4.49 03/14/2016   HGB 9.4 (L) 03/14/2016   HCT 30.6 (L) 03/14/2016   PLT 297 03/14/2016   MCV 68.2 (L) 03/14/2016   MCH 20.9 (L) 03/14/2016   MCHC 30.7 03/14/2016   RDW 21.1 (H) 03/14/2016   LYMPHSABS 770 (L) 03/09/2016   MONOABS 220 03/09/2016   EOSABS 44 03/09/2016   BASOSABS 22 03/09/2016   BMET Lab Results  Component Value Date   NA 138 03/14/2016   K 3.8 03/14/2016   CL 109 03/14/2016   CO2 23 03/14/2016   GLUCOSE 89 03/14/2016   BUN 9 03/14/2016   CREATININE 0.85 03/14/2016   CALCIUM 8.9 03/14/2016   GFRNONAA >60 03/14/2016   GFRAA >60 03/14/2016     Assessment and Plan  Weight loss = will give food/ensure ? Food insecurities  hiv disease = well controlled. Continue with good adherence  Health maintenance = flu shot  hsv proph = continue  valtrex 1gm dialy  furuncel = will give bactrim x 7 d

## 2016-05-11 ENCOUNTER — Ambulatory Visit (HOSPITAL_COMMUNITY)
Admission: EM | Admit: 2016-05-11 | Discharge: 2016-05-11 | Disposition: A | Discharge status: Home / Self Care | Payer: Medicaid Other | Attending: Internal Medicine | Admitting: Internal Medicine

## 2016-05-11 ENCOUNTER — Encounter (HOSPITAL_COMMUNITY): Payer: Self-pay | Admitting: Emergency Medicine

## 2016-05-11 DIAGNOSIS — L0291 Cutaneous abscess, unspecified: Secondary | ICD-10-CM | POA: Diagnosis not present

## 2016-05-11 MED ORDER — HYDROCODONE-ACETAMINOPHEN 5-325 MG PO TABS: 1.0000 | ORAL_TABLET | ORAL | 0 refills | Status: DC | PRN

## 2016-05-11 MED ORDER — SULFAMETHOXAZOLE-TRIMETHOPRIM 800-160 MG PO TABS: 1.0000 | ORAL_TABLET | Freq: Two times a day (BID) | ORAL | 0 refills | Status: AC

## 2016-05-11 MED ORDER — LIDOCAINE HCL 2 % IJ SOLN
INTRAMUSCULAR | Status: AC
  Filled 2016-05-11: qty 20

## 2016-05-11 NOTE — ED Provider Notes (Signed)
CSN: [id number]     Arrival date & time 05/11/16  1413 History   First MD Initiated Contact with Patient 05/11/16 1616     Chief Complaint  Patient presents with  . Abscess   (Consider location/radiation/quality/duration/timing/severity/associated sxs/prior Treatment) 45 year old female states that for one week she has had a small tender painful area to her right buttocks. It is gradually increased in size and pain. She states that it may be a boil and she has had one before.      Past Medical History:  Diagnosis Date  . Anemia   . Anxiety   . Fibroid   . Headache(784.0)   . HIV (human immunodeficiency virus infection) ([place])   . Infection    UTI   Past Surgical History:  Procedure Laterality Date  . DILATION AND CURETTAGE OF UTERUS    . HAND TENDON SURGERY     right thumb  . INDUCED ABORTION    . TUBAL LIGATION     Family History  Problem Relation Age of Onset  . Family history unknown: Yes   Social History  Substance Use Topics  . Smoking status: Former Smoker    Types: Cigarettes  . Smokeless tobacco: Never Used  . Alcohol use 0.0 oz/week     Comment: hx of alcohol abuse- social drinker now   OB History    Gravida Para Term Preterm AB Living   6 4 4   2 4    SAB TAB Ectopic Multiple Live Births   1 1           Review of Systems  Respiratory: Negative.   Gastrointestinal: Negative.   Musculoskeletal: Negative.   Skin:       As per history of present illness  Neurological: Negative.   All other systems reviewed and are negative.   Allergies  Chocolate and Tomato  Home Medications   Prior to Admission medications   Medication Sig Start Date End Date Taking? Authorizing Provider  darunavir-cobicistat (PREZCOBIX) 800-150 MG tablet Take 1 tablet by mouth daily. Swallow whole. Do NOT crush, break or chew tablets. Take with food.   Yes Historical Provider, MD  emtricitabine-tenofovir AF (DESCOVY) 200-25 MG tablet Take 1 tablet by mouth daily.   Yes  Historical Provider, MD  valACYclovir (VALTREX) 1000 MG tablet Take 1 tablet (1,000 mg total) by mouth daily. 04/10/16  Yes Carlyle Basques, MD  albuterol (PROVENTIL HFA;VENTOLIN HFA) 108 (90 Base) MCG/ACT inhaler Inhale 2 puffs into the lungs 3 (three) times daily as needed for wheezing or shortness of breath. Reported on 01/03/2016 01/03/16   Carlyle Basques, MD  ferrous gluconate (FERGON) 324 MG tablet Take 1 tablet (324 mg total) by mouth 2 (two) times daily with a meal. 03/14/16   Oswald Hillock, MD  HYDROcodone-acetaminophen (NORCO/VICODIN) 5-325 MG tablet Take 1 tablet by mouth every 4 (four) hours as needed. 05/11/16   Janne Napoleon, NP  sulfamethoxazole-trimethoprim (BACTRIM DS,SEPTRA DS) 800-160 MG tablet Take 1 tablet by mouth 2 (two) times daily. 05/11/16 05/18/16  Janne Napoleon, NP   Meds Ordered and Administered this Visit  Medications - No data to display  BP 152/91 (BP Location: Left Arm)   Pulse 73   Temp 99 F (37.2 C) (Oral)   Resp 18   LMP 03/27/2016 (Approximate)  No data found.   Physical Exam  Constitutional: She is oriented to person, place, and time. She appears well-developed and well-nourished. No distress.  HENT:  Head: Normocephalic and atraumatic.  Neck: Neck  supple.  Cardiovascular: Normal rate.   Pulmonary/Chest: Effort normal.  Musculoskeletal: Normal range of motion.  Neurological: She is alert and oriented to person, place, and time.  Skin: Skin is warm and dry.  Approximately 4 cm area of induration and fluctuance and tenderness to the left medial buttock near the gluteal cleft. It does not extend to the anus or genitalia.  Psychiatric: She has a normal mood and affect.  Nursing note and vitals reviewed.   Urgent Care Course   Clinical Course    .Marland KitchenIncision and Drainage Date/Time: 05/11/2016 4:40 PM Performed by: Marcha Dutton, Leylanie Woodmansee Authorized by: Sherlene Shams   Consent:    Consent obtained:  Verbal   Consent given by:  Patient   Risks discussed:  Incomplete  drainage, infection and pain Location:    Type:  Abscess   Location:  Lower extremity   Lower extremity location:  Buttock   Buttock location:  L buttock Pre-procedure details:    Skin preparation:  Betadine Anesthesia (see MAR for exact dosages):    Anesthesia method:  Local infiltration   Local anesthetic:  Lidocaine 2% w/o epi Procedure type:    Complexity:  Complex Procedure details:    Needle aspiration: no     Incision types:  Single with marsupialization   Incision depth:  Subcutaneous   Scalpel blade:  11   Wound management:  Probed and deloculated   Drainage:  Purulent and bloody   Drainage amount:  Copious   Wound treatment:  Drain placed   Packing materials:  1/4 in gauze Post-procedure details:    Patient tolerance of procedure:  Tolerated well, no immediate complications Comments:     Packing was taped to the skin and a dressing was placed over the OpSite    (including critical care time)  Labs Review Labs Reviewed - No data to display  Imaging Review No results found.   Visual Acuity Review  Right Eye Distance:   Left Eye Distance:   Bilateral Distance:    Right Eye Near:   Left Eye Near:    Bilateral Near:         MDM   1. Abscess    Keep the dressing is dry and clean as possible. Try not to get wet. Apply warm compresses 3-4 times a day. If you are having increased pain, fever or other problems return sooner. Take the medication as directed. RTO 2 d for wound chk and packing removal. Meds ordered this encounter  Medications  . sulfamethoxazole-trimethoprim (BACTRIM DS,SEPTRA DS) 800-160 MG tablet    Sig: Take 1 tablet by mouth 2 (two) times daily.    Dispense:  14 tablet    Refill:  0    Order Specific Question:   Supervising Provider    Answer:   Sherlene Shams [id number]  . HYDROcodone-acetaminophen (NORCO/VICODIN) 5-325 MG tablet    Sig: Take 1 tablet by mouth every 4 (four) hours as needed.    Dispense:  12 tablet    Refill:  0     Order Specific Question:   Supervising Provider    Answer:   Sherlene Shams [id number]       Janne Napoleon, NP 05/11/16 [place], NP 05/11/16 [phone]

## 2016-05-11 NOTE — Discharge Instructions (Signed)
Keep the dressing is dry and clean as possible. Try not to get wet. Apply warm compresses 3-4 times a day. If you are having increased pain, fever or other problems return sooner. Take the medication as directed.

## 2016-05-11 NOTE — ED Triage Notes (Signed)
Pt c/o abscess on left buttocks onset 1 week   Reports it's getting bigger and more painful.  Denies fevers, chills, drainage  A&O x4... NAD

## 2016-05-13 ENCOUNTER — Ambulatory Visit (HOSPITAL_COMMUNITY)
Admission: EM | Admit: 2016-05-13 | Discharge: 2016-05-13 | Disposition: A | Discharge status: Home / Self Care | Payer: Medicaid Other | Attending: Family Medicine | Admitting: Family Medicine

## 2016-05-13 ENCOUNTER — Encounter (HOSPITAL_COMMUNITY): Payer: Self-pay | Admitting: Emergency Medicine

## 2016-05-13 DIAGNOSIS — Z4801 Encounter for change or removal of surgical wound dressing: Secondary | ICD-10-CM

## 2016-05-13 DIAGNOSIS — Z09 Encounter for follow-up examination after completed treatment for conditions other than malignant neoplasm: Secondary | ICD-10-CM

## 2016-05-13 MED ORDER — BACITRACIN ZINC 500 UNIT/GM EX OINT
TOPICAL_OINTMENT | Freq: Once | CUTANEOUS | Status: AC
  Administered 2016-05-13: 15:00:00 via TOPICAL

## 2016-05-13 NOTE — ED Notes (Signed)
Dressing applied by Carmin Richmond, EMT

## 2016-05-13 NOTE — ED Triage Notes (Signed)
Here for a wound f/u... Reports feeling better... Taking/tolerating meds well... A*O x4... NAD

## 2016-05-13 NOTE — Discharge Instructions (Signed)
Keep area clean dressing change as needed. Apply bacitracin to wound.

## 2016-05-13 NOTE — ED Provider Notes (Signed)
CSN: [id number]     Arrival date & time 05/13/16  1230 History   None    Chief Complaint  Patient presents with  . Follow-up   (Consider location/radiation/quality/duration/timing/severity/associated sxs/prior Treatment) 45 y.o. female presents would recheck for I & D  2 days prior and packing removal. Patient reports feeling well, no sign of infection, no fevers. Patient states that she is continuing to take her antibiotics as instructed.    1 piece of packing removed.       Past Medical History:  Diagnosis Date  . Anemia   . Anxiety   . Fibroid   . Headache(784.0)   . HIV (human immunodeficiency virus infection) ([place])   . Infection    UTI   Past Surgical History:  Procedure Laterality Date  . DILATION AND CURETTAGE OF UTERUS    . HAND TENDON SURGERY     right thumb  . INDUCED ABORTION    . TUBAL LIGATION     Family History  Problem Relation Age of Onset  . Family history unknown: Yes   Social History  Substance Use Topics  . Smoking status: Former Smoker    Types: Cigarettes  . Smokeless tobacco: Never Used  . Alcohol use 0.0 oz/week     Comment: hx of alcohol abuse- social drinker now   OB History    Gravida Para Term Preterm AB Living   6 4 4   2 4    SAB TAB Ectopic Multiple Live Births   1 1           Review of Systems  Allergies  Chocolate and Tomato  Home Medications   Prior to Admission medications   Medication Sig Start Date End Date Taking? Authorizing Provider  darunavir-cobicistat (PREZCOBIX) 800-150 MG tablet Take 1 tablet by mouth daily. Swallow whole. Do NOT crush, break or chew tablets. Take with food.   Yes Historical Provider, MD  emtricitabine-tenofovir AF (DESCOVY) 200-25 MG tablet Take 1 tablet by mouth daily.   Yes Historical Provider, MD  HYDROcodone-acetaminophen (NORCO/VICODIN) 5-325 MG tablet Take 1 tablet by mouth every 4 (four) hours as needed. 05/11/16  Yes Janne Napoleon, NP  sulfamethoxazole-trimethoprim (BACTRIM DS,SEPTRA DS)  800-160 MG tablet Take 1 tablet by mouth 2 (two) times daily. 05/11/16 05/18/16 Yes Janne Napoleon, NP  valACYclovir (VALTREX) 1000 MG tablet Take 1 tablet (1,000 mg total) by mouth daily. 04/10/16  Yes Carlyle Basques, MD  albuterol (PROVENTIL HFA;VENTOLIN HFA) 108 (90 Base) MCG/ACT inhaler Inhale 2 puffs into the lungs 3 (three) times daily as needed for wheezing or shortness of breath. Reported on 01/03/2016 01/03/16   Carlyle Basques, MD  ferrous gluconate (FERGON) 324 MG tablet Take 1 tablet (324 mg total) by mouth 2 (two) times daily with a meal. 03/14/16   Oswald Hillock, MD   Meds Ordered and Administered this Visit  Medications - No data to display  BP 136/90 (BP Location: Left Arm)   Pulse 74   Temp 98.1 F (36.7 C) (Oral)   Resp 12   LMP 03/27/2016 (Approximate)   SpO2 100%  No data found.   Physical Exam  Urgent Care Course   Clinical Course    Procedures (including critical care time)  Labs Review Labs Reviewed - No data to display  Imaging Review No results found.   Visual Acuity Review  Right Eye Distance:   Left Eye Distance:   Bilateral Distance:    Right Eye Near:   Left Eye Near:  Bilateral Near:         MDM  No diagnosis found.     Jacqualine Mau, NP 05/13/16 [phone]

## 2016-05-31 ENCOUNTER — Encounter (HOSPITAL_COMMUNITY): Payer: Self-pay

## 2016-05-31 ENCOUNTER — Emergency Department (HOSPITAL_COMMUNITY): Payer: Medicaid Other

## 2016-05-31 ENCOUNTER — Emergency Department (HOSPITAL_COMMUNITY)
Admission: EM | Admit: 2016-05-31 | Discharge: 2016-05-31 | Disposition: A | Discharge status: Home / Self Care | Payer: Medicaid Other | Attending: Emergency Medicine | Admitting: Emergency Medicine

## 2016-05-31 DIAGNOSIS — Z87891 Personal history of nicotine dependence: Secondary | ICD-10-CM | POA: Insufficient documentation

## 2016-05-31 DIAGNOSIS — J069 Acute upper respiratory infection, unspecified: Secondary | ICD-10-CM

## 2016-05-31 DIAGNOSIS — B9789 Other viral agents as the cause of diseases classified elsewhere: Secondary | ICD-10-CM

## 2016-05-31 DIAGNOSIS — R05 Cough: Secondary | ICD-10-CM | POA: Diagnosis present

## 2016-05-31 MED ORDER — DM-GUAIFENESIN ER 30-600 MG PO TB12
1.0000 | ORAL_TABLET | Freq: Two times a day (BID) | ORAL | Status: DC
  Administered 2016-05-31: 1 via ORAL
  Filled 2016-05-31: qty 1

## 2016-05-31 MED ORDER — ALBUTEROL SULFATE HFA 108 (90 BASE) MCG/ACT IN AERS
2.0000 | INHALATION_SPRAY | Freq: Once | RESPIRATORY_TRACT | Status: AC
  Administered 2016-05-31: 2 via RESPIRATORY_TRACT
  Filled 2016-05-31: qty 6.7

## 2016-05-31 NOTE — ED Provider Notes (Signed)
[facility] DEPT Provider Note   CSN: [id number] Arrival date & time: 05/31/16  1144   By signing my name below, I, Evelene Croon, attest that this documentation has been prepared under the direction and in the presence of Virgel Manifold, MD . Electronically Signed: Evelene Croon, Scribe. 05/31/2016. 1:41 PM.   History   Chief Complaint Chief Complaint  Patient presents with  . Cough    The history is provided by the patient. No language interpreter was used.    HPI Comments:  Dawn [name] is a 45 y.o. female who presents to the Emergency Department complaining of worsening, productive cough x 1 week. She describes green sputum. Pt reports associated congestion, sore throat, and generalized body aches. She denies fever. Pt is unsure of sick contacts. She denies smoking but states she lives with some\one who does and refuses to smoke outside.   Pt is complaint with her HIV meds. She states he next appointment with ID is next month.   Past Medical History:  Diagnosis Date  . Anemia   . Anxiety   . Fibroid   . Headache(784.0)   . HIV (human immunodeficiency virus infection) ([place])   . Infection    UTI    Patient Active Problem List   Diagnosis Date Noted  . Anemia due to chronic blood loss 03/13/2016  . Acquired immunodeficiency syndrome ([place]) 12/07/2015  . Acne vulgaris 12/07/2015  . Episodic mood disorder ([place]) 12/07/2015    Past Surgical History:  Procedure Laterality Date  . DILATION AND CURETTAGE OF UTERUS    . HAND TENDON SURGERY     right thumb  . INDUCED ABORTION    . TUBAL LIGATION      OB History    Gravida Para Term Preterm AB Living   6 4 4   2 4    SAB TAB Ectopic Multiple Live Births   1 1             Home Medications    Prior to Admission medications   Medication Sig Start Date End Date Taking? Authorizing Provider  albuterol (PROVENTIL HFA;VENTOLIN HFA) 108 (90 Base) MCG/ACT inhaler Inhale 2 puffs into the lungs 3 (three) times daily as  needed for wheezing or shortness of breath. Reported on 01/03/2016 01/03/16   Carlyle Basques, MD  darunavir-cobicistat (PREZCOBIX) 800-150 MG tablet Take 1 tablet by mouth daily. Swallow whole. Do NOT crush, break or chew tablets. Take with food.    Historical Provider, MD  emtricitabine-tenofovir AF (DESCOVY) 200-25 MG tablet Take 1 tablet by mouth daily.    Historical Provider, MD  ferrous gluconate (FERGON) 324 MG tablet Take 1 tablet (324 mg total) by mouth 2 (two) times daily with a meal. 03/14/16   Oswald Hillock, MD  HYDROcodone-acetaminophen (NORCO/VICODIN) 5-325 MG tablet Take 1 tablet by mouth every 4 (four) hours as needed. 05/11/16   Janne Napoleon, NP  valACYclovir (VALTREX) 1000 MG tablet Take 1 tablet (1,000 mg total) by mouth daily. 04/10/16   Carlyle Basques, MD    Family History Family History  Problem Relation Age of Onset  . Family history unknown: Yes    Social History Social History  Substance Use Topics  . Smoking status: Former Smoker    Types: Cigarettes  . Smokeless tobacco: Never Used  . Alcohol use 0.0 oz/week     Comment: hx of alcohol abuse- social drinker now     Allergies   Chocolate and Tomato   Review of Systems Review of Systems  Constitutional: Negative for chills and fever.  HENT: Positive for congestion and sore throat.   Respiratory: Positive for cough. Negative for shortness of breath.   Cardiovascular: Negative for chest pain.  Musculoskeletal: Positive for myalgias (generalized).    Physical Exam Updated Vital Signs BP 123/93   Pulse 94   Temp 98.5 F (36.9 C) (Oral)   Resp 18   Ht 5\' 7"  (1.702 m)   Wt 130 lb (59 kg)   SpO2 100%   BMI 20.36 kg/m   Physical Exam  Constitutional: She is oriented to person, place, and time. She appears well-developed and well-nourished. No distress.  HENT:  Head: Normocephalic and atraumatic.  Eyes: EOM are normal.  Neck: Normal range of motion.  Single enlarged right anterior cervical node    Cardiovascular: Normal rate, regular rhythm and normal heart sounds.   Pulmonary/Chest: Effort normal and breath sounds normal.  Abdominal: Soft. She exhibits no distension. There is no tenderness.  Musculoskeletal: Normal range of motion.  Neurological: She is alert and oriented to person, place, and time.  Skin: Skin is warm and dry.  Psychiatric: She has a normal mood and affect. Judgment normal.  Nursing note and vitals reviewed.   ED Treatments / Results  DIAGNOSTIC STUDIES:  Oxygen Saturation is 100% on RA, normal by my interpretation.    COORDINATION OF CARE:  1:40 PM Discussed treatment plan with pt at bedside and pt agreed to plan.  Labs (all labs ordered are listed, but only abnormal results are displayed) Labs Reviewed - No data to display  EKG  EKG Interpretation None       Radiology Dg Chest 2 View  Result Date: 05/31/2016 CLINICAL DATA:  Pt c/o central chest pain, upper abdominal pain, SOB, productive cough, congestion, and N/V x 1 week. Hx of HIV. Pt is a former smoker. EXAM: CHEST  2 VIEW COMPARISON:  07/17/2015 FINDINGS: The heart size and mediastinal contours are within normal limits. Both lungs are clear. No pleural effusion or pneumothorax. The visualized skeletal structures are unremarkable. IMPRESSION: No active cardiopulmonary disease. Electronically Signed   By: Lajean Manes M.D.   On: 05/31/2016 13:15    Procedures Procedures (including critical care time)  Medications Ordered in ED Medications - No data to display   Initial Impression / Assessment and Plan / ED Course  I have reviewed the triage vital signs and the nursing notes.  Pertinent labs & imaging results that were available during my care of the patient were reviewed by me and considered in my medical decision making (see chart for details).  Clinical Course    45 year old female with likely viral URI. Exam is pretty unremarkable. She is hemodynamically stable. Chest x-ray  without acute abnormality. Plans to medical treatment. Return precautions were discussed.  Final Clinical Impressions(s) / ED Diagnoses   Final diagnoses:  Viral URI with cough    New Prescriptions New Prescriptions   No medications on file   I personally preformed the services scribed in my presence. The recorded information has been reviewed is accurate. Virgel Manifold, MD.     Virgel Manifold, MD 06/05/16 [phone]

## 2016-05-31 NOTE — ED Triage Notes (Signed)
Pt presents with 1 week h/o cough, nasal congestion and sore throat.

## 2016-07-04 ENCOUNTER — Encounter: Payer: Self-pay | Admitting: Emergency Medicine

## 2016-07-04 ENCOUNTER — Emergency Department: Payer: Medicaid Other

## 2016-07-04 ENCOUNTER — Encounter: Payer: Self-pay | Admitting: Obstetrics & Gynecology

## 2016-07-04 ENCOUNTER — Emergency Department
Admission: EM | Admit: 2016-07-04 | Discharge: 2016-07-04 | Disposition: A | Discharge status: Home / Self Care | Payer: Medicaid Other | Attending: Emergency Medicine | Admitting: Emergency Medicine

## 2016-07-04 DIAGNOSIS — Z21 Asymptomatic human immunodeficiency virus [HIV] infection status: Secondary | ICD-10-CM | POA: Diagnosis not present

## 2016-07-04 DIAGNOSIS — Z87891 Personal history of nicotine dependence: Secondary | ICD-10-CM | POA: Insufficient documentation

## 2016-07-04 DIAGNOSIS — Z79899 Other long term (current) drug therapy: Secondary | ICD-10-CM | POA: Diagnosis not present

## 2016-07-04 DIAGNOSIS — R51 Headache: Secondary | ICD-10-CM | POA: Diagnosis present

## 2016-07-04 DIAGNOSIS — K0889 Other specified disorders of teeth and supporting structures: Secondary | ICD-10-CM | POA: Diagnosis not present

## 2016-07-04 LAB — CBC WITH DIFFERENTIAL/PLATELET
Basophils Absolute: 0 10*3/uL (ref 0–0.1)
Basophils Relative: 1 %
Eosinophils Absolute: 0 10*3/uL (ref 0–0.7)
Eosinophils Relative: 1 %
HCT: 37 % (ref 35.0–47.0)
Hemoglobin: 12.1 g/dL (ref 12.0–16.0)
Lymphocytes Relative: 38 %
Lymphs Abs: 1.2 10*3/uL (ref 1.0–3.6)
MCH: 28.1 pg (ref 26.0–34.0)
MCHC: 32.6 g/dL (ref 32.0–36.0)
MCV: 86.2 fL (ref 80.0–100.0)
Monocytes Absolute: 0.4 10*3/uL (ref 0.2–0.9)
Monocytes Relative: 14 %
Neutro Abs: 1.5 10*3/uL (ref 1.4–6.5)
Neutrophils Relative %: 46 %
Platelets: 256 10*3/uL (ref 150–440)
RBC: 4.29 MIL/uL (ref 3.80–5.20)
RDW: 15.3 % — ABNORMAL HIGH (ref 11.5–14.5)
WBC: 3.3 10*3/uL — ABNORMAL LOW (ref 3.6–11.0)

## 2016-07-04 LAB — BASIC METABOLIC PANEL
Anion gap: 8 (ref 5–15)
BUN: 11 mg/dL (ref 6–20)
CO2: 25 mmol/L (ref 22–32)
Calcium: 9.3 mg/dL (ref 8.9–10.3)
Chloride: 105 mmol/L (ref 101–111)
Creatinine, Ser: 0.93 mg/dL (ref 0.44–1.00)
GFR calc Af Amer: >60 mL/min (ref >60)
GFR calc non Af Amer: >60 mL/min (ref >60)
Glucose, Bld: 90 mg/dL (ref 65–99)
Potassium: 3.7 mmol/L (ref 3.5–5.1)
Sodium: 138 mmol/L (ref 135–145)

## 2016-07-04 MED ORDER — IOPAMIDOL (ISOVUE-300) INJECTION 61%
75.0000 mL | Freq: Once | INTRAVENOUS | Status: DC | PRN
  Filled 2016-07-04: qty 75

## 2016-07-04 NOTE — ED Notes (Signed)
Pt educated on CT scan. Pt unsure if she can go through with scan due to her being nervous. Pt asked if she would take any medication to help her relax, pt denied, st "I cant handle anything like that."

## 2016-07-04 NOTE — ED Notes (Signed)
Pt in CT room, pt refusing scan, pt getting out of CT bed st "I just got a lot going on right now, im going to have to do this later, I cant do this." Pt helped to stretcher, taken back to RM 42, IV removed. Pt requesting d/c papers at this time. PA made aware.

## 2016-07-04 NOTE — ED Provider Notes (Signed)
[facility] Emergency Department Provider Note  ____________________________________________  Time seen: Approximately 8:53 PM  I have reviewed the triage vital signs and the nursing notes.   HISTORY  Chief Complaint Headache    HPI Dawn [name] is a 45 y.o. female who presents to emergency department complaining of increased pain and swelling status post a dental procedure. Patient states that she had her wisdom teeth extracted on 06/27/2016. Patient states that she was placed on antibiotics and pain medication for same but states that over the last 2-3 days the symptoms have drastically increased. The patient also endorses a frontal headache. She denies any visual changes, difficulty breathing or swallowing, chest pain, short of breath, abdominal pain, nausea or vomiting. Patient states that she has been taking the antibiotics as prescribed. Patient reports that the swelling is both in the upper dentition and feels like it has progressed into the TMJ region.   Past Medical History:  Diagnosis Date  . Anemia   . Anxiety   . Fibroid   . Headache(784.0)   . HIV (human immunodeficiency virus infection) ([place])   . Infection    UTI    Patient Active Problem List   Diagnosis Date Noted  . Anemia due to chronic blood loss 03/13/2016  . Acquired immunodeficiency syndrome ([place]) 12/07/2015  . Acne vulgaris 12/07/2015  . Episodic mood disorder ([place]) 12/07/2015    Past Surgical History:  Procedure Laterality Date  . DILATION AND CURETTAGE OF UTERUS    . HAND TENDON SURGERY     right thumb  . INDUCED ABORTION    . TUBAL LIGATION      Prior to Admission medications   Medication Sig Start Date End Date Taking? Authorizing Provider  albuterol (PROVENTIL HFA;VENTOLIN HFA) 108 (90 Base) MCG/ACT inhaler Inhale 2 puffs into the lungs 3 (three) times daily as needed for wheezing or shortness of breath. Reported on 01/03/2016 Patient taking differently: Inhale 3  puffs into the lungs 3 (three) times daily as needed for wheezing or shortness of breath. Reported on 01/03/2016 01/03/16   Carlyle Basques, MD  darunavir-cobicistat (PREZCOBIX) 800-150 MG tablet Take 1 tablet by mouth daily. Swallow whole. Do NOT crush, break or chew tablets. Take with food.    Historical Provider, MD  emtricitabine-tenofovir AF (DESCOVY) 200-25 MG tablet Take 1 tablet by mouth daily.    Historical Provider, MD  ferrous gluconate (FERGON) 324 MG tablet Take 1 tablet (324 mg total) by mouth 2 (two) times daily with a meal. Patient not taking: Reported on 05/31/2016 03/14/16   Oswald Hillock, MD  HYDROcodone-acetaminophen (NORCO/VICODIN) 5-325 MG tablet Take 1 tablet by mouth every 4 (four) hours as needed. Patient not taking: Reported on 05/31/2016 05/11/16   Janne Napoleon, NP  valACYclovir (VALTREX) 1000 MG tablet Take 1 tablet (1,000 mg total) by mouth daily. Patient taking differently: Take 1,000 mg by mouth daily as needed (for rash).  04/10/16   Carlyle Basques, MD    Allergies Chocolate and Tomato  Family History  Problem Relation Age of Onset  . Family history unknown: Yes    Social History Social History  Substance Use Topics  . Smoking status: Former Smoker    Types: Cigarettes  . Smokeless tobacco: Never Used  . Alcohol use 0.0 oz/week     Comment: hx of alcohol abuse- social drinker now     Review of Systems  Constitutional: No fever/chills Eyes: No visual changes. No discharge ENT: Positive for increased pain status post dental  Cardiovascular: no chest pain. Respiratory: no cough. No SOB. Gastrointestinal: No abdominal pain.  No nausea, no vomiting.  Musculoskeletal: Negative for musculoskeletal pain. Skin: Negative for rash, abrasions, lacerations, ecchymosis. Neurological: Negative for headaches, focal weakness or numbness. 10-point ROS otherwise negative.  ____________________________________________   PHYSICAL EXAM:  VITAL SIGNS: ED Triage Vitals  Enc  Vitals Group     BP 07/04/16 1843 (!) 149/88     Pulse Rate 07/04/16 1843 77     Resp 07/04/16 1843 16     Temp 07/04/16 1843 99.2 F (37.3 C)     Temp Source 07/04/16 1843 Oral     SpO2 07/04/16 1843 100 %     Weight 07/04/16 1844 129 lb (58.5 kg)     Height 07/04/16 1844 5\' 7"  (1.702 m)     Head Circumference --      Peak Flow --      Pain Score 07/04/16 1845 10     Pain Loc --      Pain Edu? --      Excl. in [place]? --      Constitutional: Alert and oriented. Well appearing and in no acute distress. Eyes: Conjunctivae are normal. PERRL. EOMI. Head: Atraumatic. ENT:      Ears:       Nose: No congestion/rhinnorhea.      Mouth/Throat: Mucous membranes are moist. Dental extraction is visualized to the wasn't even of the upper dentition. Sockets appear to be healing appropriately with no gross signs of infection. No drainage. No significant erythema or edema. Neck: No stridor. Neck is supple with full range of motion Hematological/Lymphatic/Immunilogical: Diffuse, mobile, nontender anterior cervical lymphadenopathy. Cardiovascular: Normal rate, regular rhythm. Normal S1 and S2.  Good peripheral circulation. Respiratory: Normal respiratory effort without tachypnea or retractions. Lungs CTAB. Good air entry to the bases with no decreased or absent breath sounds. Musculoskeletal: Full range of motion to all extremities. No gross deformities appreciated. Neurologic:  Normal speech and language. No gross focal neurologic deficits are appreciated.  Skin:  Skin is warm, dry and intact. No . Psychiatric: Mood and affect are normal. Speech and behavior are normal. Patient exhibits appropriate insight and judgement.   ____________________________________________   LABS (all labs ordered are listed, but only abnormal results are displayed)  Labs Reviewed  CBC WITH DIFFERENTIAL/PLATELET - Abnormal; Notable for the following:       Result Value   WBC 3.3 (*)    RDW 15.3 (*)    All other  components within normal limits  BASIC METABOLIC PANEL   ____________________________________________  EKG   ____________________________________________  RADIOLOGY   No results found.  ____________________________________________    PROCEDURES  Procedure(s) performed:    Procedures    Medications  iopamidol (ISOVUE-300) 61 % injection 75 mL (75 mLs Intravenous Canceled Entry 07/04/16 2118)     ____________________________________________   INITIAL IMPRESSION / ASSESSMENT AND PLAN / ED COURSE  Pertinent labs & imaging results that were available during my care of the patient were reviewed by me and considered in my medical decision making (see chart for details).  Review of the [place] CSRS was performed in accordance of the [place] prior to dispensing any controlled drugs.  Clinical Course     Patient's diagnosis is consistent with Dental pain. Patient presents emergency Department 7 days status post dental extraction. She complains of continued pain and increased swelling. Patient's labs returned reassuring results. After discussion, I advised patient that I would order a CT scan to rule  out any underlying dental infection that was not observed on physical exam. Patient became anxious and declined CT scan. I advised patient that this was only way to determine acute underlying infection. Patient verbalizes understanding but still declined CT scan. Patient has not followed up with her oral surgeon at any point since dental extraction. I advised patient to call oral surgeon in the morning for follow-up. No medications are prescribed this patient is currently on narcotic pain medication and antibiotics.. Patient is given ED precautions to return to the ED for any worsening or new symptoms.     ____________________________________________  FINAL CLINICAL IMPRESSION(S) / ED DIAGNOSES  Final diagnoses:  Pain, dental      NEW MEDICATIONS STARTED DURING THIS  VISIT:  New Prescriptions   No medications on file        This chart was dictated using voice recognition software/Dragon. Despite best efforts to proofread, errors can occur which can change the meaning. Any change was purely unintentional.    Darletta Moll, PA-C 07/04/16 2132    Rudene Re, MD 07/04/16 2348

## 2016-07-04 NOTE — ED Notes (Signed)
Pt agreed to CT scan, RN to accompany pt.

## 2016-07-04 NOTE — ED Triage Notes (Signed)
States had left upper tooth extraction on 10/31.  States has been taking medication but continues to c/o dental pain and numbness to lower jaw and headaches.

## 2016-07-13 ENCOUNTER — Other Ambulatory Visit: Payer: Self-pay | Admitting: *Deleted

## 2016-07-13 ENCOUNTER — Other Ambulatory Visit: Payer: Medicaid Other

## 2016-07-13 DIAGNOSIS — B20 Human immunodeficiency virus [HIV] disease: Secondary | ICD-10-CM

## 2016-07-19 ENCOUNTER — Other Ambulatory Visit: Payer: Medicaid Other

## 2016-07-22 ENCOUNTER — Other Ambulatory Visit: Payer: Self-pay | Admitting: Internal Medicine

## 2016-07-22 DIAGNOSIS — B20 Human immunodeficiency virus [HIV] disease: Secondary | ICD-10-CM

## 2016-07-22 DIAGNOSIS — B009 Herpesviral infection, unspecified: Secondary | ICD-10-CM

## 2016-07-26 ENCOUNTER — Other Ambulatory Visit: Payer: Medicaid Other

## 2016-07-27 ENCOUNTER — Ambulatory Visit: Payer: Medicaid Other | Admitting: Internal Medicine

## 2016-08-03 ENCOUNTER — Telehealth: Payer: Self-pay

## 2016-08-03 ENCOUNTER — Encounter: Payer: Medicaid Other | Admitting: Obstetrics & Gynecology

## 2016-08-03 NOTE — Telephone Encounter (Signed)
Patient no-showed her appointment today. I have called Dr.Dorn office to make them aware.

## 2016-08-11 ENCOUNTER — Other Ambulatory Visit: Payer: Self-pay | Admitting: *Deleted

## 2016-08-11 ENCOUNTER — Telehealth: Payer: Self-pay | Admitting: *Deleted

## 2016-08-11 DIAGNOSIS — B20 Human immunodeficiency virus [HIV] disease: Secondary | ICD-10-CM

## 2016-08-11 DIAGNOSIS — B009 Herpesviral infection, unspecified: Secondary | ICD-10-CM

## 2016-08-11 MED ORDER — VALACYCLOVIR HCL 1 G PO TABS: ORAL_TABLET | ORAL | 5 refills | Status: DC

## 2016-08-11 MED ORDER — DARUNAVIR-COBICISTAT 800-150 MG PO TABS: ORAL_TABLET | ORAL | 5 refills | Status: DC

## 2016-08-11 MED ORDER — EMTRICITABINE-TENOFOVIR AF 200-25 MG PO TABS: 1.0000 | ORAL_TABLET | Freq: Every day | ORAL | 5 refills | Status: DC

## 2016-08-11 NOTE — Telephone Encounter (Signed)
Patient called stating she needs home delivery of her medications. As she has Medicaid I sent her Rxs to [facility]. Spoke to Sam, pharmacist and he will blister pack her medications. [name]

## 2016-08-16 ENCOUNTER — Other Ambulatory Visit: Payer: Self-pay | Admitting: *Deleted

## 2016-08-16 DIAGNOSIS — B009 Herpesviral infection, unspecified: Secondary | ICD-10-CM

## 2016-08-16 DIAGNOSIS — B20 Human immunodeficiency virus [HIV] disease: Secondary | ICD-10-CM

## 2016-08-16 MED ORDER — ALBUTEROL SULFATE HFA 108 (90 BASE) MCG/ACT IN AERS: 2.0000 | INHALATION_SPRAY | Freq: Three times a day (TID) | RESPIRATORY_TRACT | 3 refills | Status: DC | PRN

## 2016-08-16 MED ORDER — EMTRICITABINE-TENOFOVIR AF 200-25 MG PO TABS: 1.0000 | ORAL_TABLET | Freq: Every day | ORAL | 5 refills | Status: DC

## 2016-08-16 MED ORDER — VALACYCLOVIR HCL 1 G PO TABS: ORAL_TABLET | ORAL | 5 refills | Status: DC

## 2016-08-16 MED ORDER — DARUNAVIR-COBICISTAT 800-150 MG PO TABS: ORAL_TABLET | ORAL | 5 refills | Status: DC

## 2016-09-06 ENCOUNTER — Encounter (HOSPITAL_COMMUNITY): Payer: Self-pay | Admitting: *Deleted

## 2016-09-06 ENCOUNTER — Emergency Department (HOSPITAL_COMMUNITY): Payer: Medicaid Other

## 2016-09-06 ENCOUNTER — Emergency Department (HOSPITAL_COMMUNITY)
Admission: EM | Admit: 2016-09-06 | Discharge: 2016-09-06 | Disposition: A | Discharge status: Home / Self Care | Payer: Medicaid Other | Attending: Emergency Medicine | Admitting: Emergency Medicine

## 2016-09-06 DIAGNOSIS — J209 Acute bronchitis, unspecified: Secondary | ICD-10-CM | POA: Insufficient documentation

## 2016-09-06 DIAGNOSIS — R197 Diarrhea, unspecified: Secondary | ICD-10-CM | POA: Diagnosis not present

## 2016-09-06 DIAGNOSIS — Z87891 Personal history of nicotine dependence: Secondary | ICD-10-CM | POA: Insufficient documentation

## 2016-09-06 DIAGNOSIS — R05 Cough: Secondary | ICD-10-CM | POA: Diagnosis present

## 2016-09-06 LAB — I-STAT BETA HCG BLOOD, ED (MC, WL, AP ONLY): I-stat hCG, quantitative: <5 m[IU]/mL (ref <5)

## 2016-09-06 LAB — COMPREHENSIVE METABOLIC PANEL
ALT: 19 U/L (ref 14–54)
AST: 21 U/L (ref 15–41)
Albumin: 3.8 g/dL (ref 3.5–5.0)
Alkaline Phosphatase: 56 U/L (ref 38–126)
Anion gap: 8 (ref 5–15)
BUN: 9 mg/dL (ref 6–20)
CO2: 24 mmol/L (ref 22–32)
Calcium: 9.3 mg/dL (ref 8.9–10.3)
Chloride: 107 mmol/L (ref 101–111)
Creatinine, Ser: 0.96 mg/dL (ref 0.44–1.00)
GFR calc Af Amer: >60 mL/min (ref >60)
GFR calc non Af Amer: >60 mL/min (ref >60)
Glucose, Bld: 83 mg/dL (ref 65–99)
Potassium: 3.5 mmol/L (ref 3.5–5.1)
Sodium: 139 mmol/L (ref 135–145)
Total Bilirubin: 0.3 mg/dL (ref 0.3–1.2)
Total Protein: 7.5 g/dL (ref 6.5–8.1)

## 2016-09-06 LAB — CBC
HCT: 35.6 % — ABNORMAL LOW (ref 36.0–46.0)
Hemoglobin: 11.6 g/dL — ABNORMAL LOW (ref 12.0–15.0)
MCH: 27.8 pg (ref 26.0–34.0)
MCHC: 32.6 g/dL (ref 30.0–36.0)
MCV: 85.2 fL (ref 78.0–100.0)
Platelets: 268 10*3/uL (ref 150–400)
RBC: 4.18 MIL/uL (ref 3.87–5.11)
RDW: 16.1 % — ABNORMAL HIGH (ref 11.5–15.5)
WBC: 3.9 10*3/uL — ABNORMAL LOW (ref 4.0–10.5)

## 2016-09-06 LAB — LIPASE, BLOOD: Lipase: 34 U/L (ref 11–51)

## 2016-09-06 LAB — URINALYSIS, ROUTINE W REFLEX MICROSCOPIC
Bilirubin Urine: NEGATIVE
Glucose, UA: NEGATIVE mg/dL
Hgb urine dipstick: NEGATIVE
Ketones, ur: NEGATIVE mg/dL
Leukocytes, UA: NEGATIVE
Nitrite: NEGATIVE
Protein, ur: NEGATIVE mg/dL
Specific Gravity, Urine: 1.023 (ref 1.005–1.030)
pH: 6 (ref 5.0–8.0)

## 2016-09-06 MED ORDER — AZITHROMYCIN 250 MG PO TABS: ORAL_TABLET | ORAL | 0 refills | Status: DC

## 2016-09-06 MED ORDER — BENZONATATE 100 MG PO CAPS: 100.0000 mg | ORAL_CAPSULE | Freq: Three times a day (TID) | ORAL | 0 refills | Status: DC

## 2016-09-06 MED ORDER — AZITHROMYCIN 250 MG PO TABS
500.0000 mg | ORAL_TABLET | Freq: Once | ORAL | Status: AC
  Administered 2016-09-06: 500 mg via ORAL
  Filled 2016-09-06: qty 2

## 2016-09-06 NOTE — ED Notes (Signed)
The pt has returned from xray  Alert no distress

## 2016-09-06 NOTE — ED Provider Notes (Signed)
[facility] DEPT Provider Note   CSN: [id number] Arrival date & time: 09/06/16  1152     History   Chief Complaint Chief Complaint  Patient presents with  . Cough    HPI Dawn [name] is a 46 y.o. female.  HPI  46 y.o. female with a hx of HIV, presents to the Emergency Department today complaining of cough, congestion, and abdominal pain x 1 week. Notes cough is productive. No fevers. No CP/SOB. Notes abdominal pain occurs with coughing and is mainly epigastric. No N/V. Notes mild diarrhea. No sick contacts. Has not tried OTC remedies. Notes pain is 9/10 but is slowly resolving in ED. No other symptoms noted.   Past Medical History:  Diagnosis Date  . Anemia   . Anxiety   . Fibroid   . Headache(784.0)   . HIV (human immunodeficiency virus infection) ([place])   . Infection    UTI    Patient Active Problem List   Diagnosis Date Noted  . Anemia due to chronic blood loss 03/13/2016  . Acquired immunodeficiency syndrome ([place]) 12/07/2015  . Acne vulgaris 12/07/2015  . Episodic mood disorder ([place]) 12/07/2015    Past Surgical History:  Procedure Laterality Date  . DILATION AND CURETTAGE OF UTERUS    . HAND TENDON SURGERY     right thumb  . INDUCED ABORTION    . TUBAL LIGATION      OB History    Gravida Para Term Preterm AB Living   6 4 4   2 4    SAB TAB Ectopic Multiple Live Births   1 1             Home Medications    Prior to Admission medications   Medication Sig Start Date End Date Taking? Authorizing Provider  albuterol (PROVENTIL HFA;VENTOLIN HFA) 108 (90 Base) MCG/ACT inhaler Inhale 2 puffs into the lungs 3 (three) times daily as needed for wheezing or shortness of breath. Reported on 01/03/2016 08/16/16   Carlyle Basques, MD  darunavir-cobicistat (PREZCOBIX) 800-150 MG tablet TAKE 1 TABLET BY MOUTH DAILY. SWALLOW WHOLE. DO NOT CRUSH, BREAK OR CHEW TABLETS.TK WITH FOOD 08/16/16   Carlyle Basques, MD  emtricitabine-tenofovir AF (DESCOVY) 200-25 MG tablet  Take 1 tablet by mouth daily. 08/16/16   Carlyle Basques, MD  ferrous gluconate (FERGON) 324 MG tablet Take 1 tablet (324 mg total) by mouth 2 (two) times daily with a meal. Patient not taking: Reported on 05/31/2016 03/14/16   Oswald Hillock, MD  HYDROcodone-acetaminophen (NORCO/VICODIN) 5-325 MG tablet Take 1 tablet by mouth every 4 (four) hours as needed. Patient not taking: Reported on 05/31/2016 05/11/16   Janne Napoleon, NP  valACYclovir (VALTREX) 1000 MG tablet TAKE 1 TABLET(1000 MG) BY MOUTH DAILY 08/16/16   Carlyle Basques, MD    Family History Family History  Problem Relation Age of Onset  . Family history unknown: Yes    Social History Social History  Substance Use Topics  . Smoking status: Former Smoker    Types: Cigarettes  . Smokeless tobacco: Never Used  . Alcohol use 0.0 oz/week     Comment: hx of alcohol abuse- social drinker now     Allergies   Chocolate and Tomato   Review of Systems Review of Systems ROS reviewed and all are negative for acute change except as noted in the HPI.   Physical Exam Updated Vital Signs BP 119/73 (BP Location: Right Arm)   Pulse 73   Temp 99 F (37.2 C) (Oral)   Resp  14   SpO2 100%   Physical Exam  Constitutional: She is oriented to person, place, and time. She appears well-developed and well-nourished. No distress.  HENT:  Head: Normocephalic and atraumatic.  Right Ear: Tympanic membrane, external ear and ear canal normal.  Left Ear: Tympanic membrane, external ear and ear canal normal.  Nose: Nose normal.  Mouth/Throat: Uvula is midline, oropharynx is clear and moist and mucous membranes are normal. No trismus in the jaw. No oropharyngeal exudate, posterior oropharyngeal erythema or tonsillar abscesses.  Eyes: EOM are normal. Pupils are equal, round, and reactive to light.  Neck: Normal range of motion. Neck supple. No tracheal deviation present.  Cardiovascular: Normal rate, regular rhythm, S1 normal, S2 normal, normal heart  sounds, intact distal pulses and normal pulses.   Pulmonary/Chest: Effort normal and breath sounds normal. No respiratory distress. She has no decreased breath sounds. She has no wheezes. She has no rhonchi. She has no rales.  Abdominal: Normal appearance and bowel sounds are normal. There is no tenderness. There is no rigidity, no rebound, no guarding, no CVA tenderness, no tenderness at McBurney's point and negative Murphy's sign.  Musculoskeletal: Normal range of motion.  Neurological: She is alert and oriented to person, place, and time.  Skin: Skin is warm and dry.  Psychiatric: She has a normal mood and affect. Her speech is normal and behavior is normal. Thought content normal.   ED Treatments / Results  Labs (all labs ordered are listed, but only abnormal results are displayed) Labs Reviewed  CBC - Abnormal; Notable for the following:       Result Value   WBC 3.9 (*)    Hemoglobin 11.6 (*)    HCT 35.6 (*)    RDW 16.1 (*)    All other components within normal limits  LIPASE, BLOOD  COMPREHENSIVE METABOLIC PANEL  URINALYSIS, ROUTINE W REFLEX MICROSCOPIC  I-STAT BETA HCG BLOOD, ED ([facility], [facility], [facility] ONLY)   EKG  EKG Interpretation None       Radiology Dg Chest 2 View  Result Date: 09/06/2016 CLINICAL DATA:  Productive cough with clear mucus, chest pain and tinder abdomin x 1 week EXAM: CHEST  2 VIEW COMPARISON:  05/31/2016 FINDINGS: Normal mediastinum and cardiac silhouette. Chronic central bronchitic markings. Normal pulmonary vasculature. No effusion, infiltrate, or pneumothorax. IMPRESSION: Bronchitic markings.  No acute findings. Electronically Signed   By: Suzy Bouchard M.D.   On: 09/06/2016 16:29    Procedures Procedures (including critical care time)  Medications Ordered in ED Medications - No data to display   Initial Impression / Assessment and Plan / ED Course  I have reviewed the triage vital signs and the nursing notes.  Pertinent labs & imaging results  that were available during my care of the patient were reviewed by me and considered in my medical decision making (see chart for details).  Clinical Course     Final Clinical Impressions(s) / ED Diagnoses  {I have reviewed and evaluated the relevant laboratory values. {I have reviewed and evaluated the relevant imaging studies.  {I have reviewed the relevant previous healthcare records.  {I obtained HPI from historian.   ED Course:  Assessment: Pt is a 45yF Hx HIV presents with URI symptoms with associated minimal diarrhea x 1 week. No fevers. No CP/SOB. On exam, pt in NAD. VSS. Afebrile. Lungs CTA, Heart RRR. Abdomen nontender/soft. Labs otherwise unremarkable. Pt CXR negative for acute infiltrate. Shows bronchitic changes. Given duration of symptoms and pt immunocompromised status. Will  start on Azithromycin and close follow up to PCP Pt will be discharged with symptomatic treatment.  Verbalizes understanding and is agreeable with plan. Pt is hemodynamically stable & in NAD prior to dc.  Disposition/Plan: DC Home Additional Verbal discharge instructions given and discussed with patient.  Pt Instructed to f/u with PCP in the next week for evaluation and treatment of symptoms. Return precautions given Pt acknowledges and agrees with plan  Supervising Physician Leo Grosser, MD  Final diagnoses:  Acute bronchitis, unspecified organism    New Prescriptions New Prescriptions   No medications on file     Shary Decamp, PA-C 09/06/16 1640    Leo Grosser, MD 09/07/16 [phone]

## 2016-09-06 NOTE — ED Triage Notes (Signed)
Pt reports cough, congestion, nausea and abdominal pain for 1 week.

## 2016-09-06 NOTE — Discharge Instructions (Signed)
Please read and follow all provided instructions.  Your diagnoses today include:  1. Acute bronchitis, unspecified organism     Tests performed today include: Vital signs. See below for your results today.   Medications prescribed:  Take as prescribed   Home care instructions:  Follow any educational materials contained in this packet.  Follow-up instructions: Please follow-up with your primary care provider for further evaluation of symptoms and treatment   Return instructions:  Please return to the Emergency Department if you do not get better, if you get worse, or new symptoms OR  - Fever (temperature greater than 101.47F)  - Bleeding that does not stop with holding pressure to the area    -Severe pain (please note that you may be more sore the day after your accident)  - Chest Pain  - Difficulty breathing  - Severe nausea or vomiting  - Inability to tolerate food and liquids  - Passing out  - Skin becoming red around your wounds  - Change in mental status (confusion or lethargy)  - New numbness or weakness    Please return if you have any other emergent concerns.  Additional Information:  Your vital signs today were: BP 119/73 (BP Location: Right Arm)    Pulse 73    Temp 99 F (37.2 C) (Oral)    Resp 14    SpO2 100%  If your blood pressure (BP) was elevated above 135/85 this visit, please have this repeated by your doctor within one month. ---------------

## 2016-09-06 NOTE — ED Notes (Signed)
Patient sent to xray

## 2016-09-06 NOTE — ED Notes (Signed)
To x-ray

## 2016-09-09 ENCOUNTER — Other Ambulatory Visit: Payer: Self-pay | Admitting: Internal Medicine

## 2016-09-09 DIAGNOSIS — B20 Human immunodeficiency virus [HIV] disease: Secondary | ICD-10-CM

## 2016-09-12 ENCOUNTER — Telehealth: Payer: Self-pay | Admitting: *Deleted

## 2016-09-12 NOTE — Telephone Encounter (Signed)
Per the patient she will call us when she is ready for the Mammogram. She advised she does not have time right now. Advised the patient of the importance of this test and she agrees but she just can not do it right now. Advised her to call the office when she is ready for the mammogram.

## 2016-09-18 ENCOUNTER — Other Ambulatory Visit: Payer: Medicaid Other

## 2016-10-02 ENCOUNTER — Telehealth: Payer: Self-pay

## 2016-10-02 ENCOUNTER — Ambulatory Visit: Payer: Medicaid Other | Admitting: Internal Medicine

## 2016-10-02 NOTE — Telephone Encounter (Signed)
Error/ Duplicate

## 2016-10-02 NOTE — Telephone Encounter (Signed)
Patient left message on vice mail:  My scripts are going to a place where I do not live by [employer].  I use [facility]    Return call to patient .  Patient's insurance is medicaid.  I doubt the scripts are coming form them via [employer].  Most medicaid scripts are picked up at pharmacy.  Patient says she is frustrated with our office. She is not able to tell me the medication name. She says she is homeless and medication went to a place she was is no longer living.   Patient did not want to continue the discussion and hung up the phone.    Laverle Patter, RN

## 2016-10-16 NOTE — Telephone Encounter (Signed)
Patient called back with updated pharmacy information. She used to use [facility] out of [place]. She now wants to use only [facility], as they are able to deliver to her wherever she is.   RN left message for [facility] pharmacy asking to cancel her prescription refills.  RN confirmed regimen with [facility]. They will contact [facility] to make sure they have all refills. Landis Gandy, RN

## 2016-10-24 ENCOUNTER — Telehealth: Payer: Self-pay | Admitting: *Deleted

## 2016-10-24 NOTE — Telephone Encounter (Signed)
Patient called to let us know that she is planning on going to a six month treatment facility.

## 2016-11-07 ENCOUNTER — Ambulatory Visit: Payer: Medicaid Other | Admitting: Internal Medicine

## 2016-11-09 ENCOUNTER — Ambulatory Visit (INDEPENDENT_AMBULATORY_CARE_PROVIDER_SITE_OTHER): Payer: Medicaid Other | Admitting: Internal Medicine

## 2016-11-09 ENCOUNTER — Encounter: Payer: Self-pay | Admitting: Internal Medicine

## 2016-11-09 VITALS — BP 118/77 | HR 83 | Temp 98.7°F | Ht 67.0 in | Wt 152.0 lb

## 2016-11-09 DIAGNOSIS — Z23 Encounter for immunization: Secondary | ICD-10-CM | POA: Diagnosis not present

## 2016-11-09 DIAGNOSIS — F320 Major depressive disorder, single episode, mild: Secondary | ICD-10-CM | POA: Diagnosis not present

## 2016-11-09 DIAGNOSIS — R42 Dizziness and giddiness: Secondary | ICD-10-CM

## 2016-11-09 DIAGNOSIS — B20 Human immunodeficiency virus [HIV] disease: Secondary | ICD-10-CM | POA: Diagnosis present

## 2016-11-09 DIAGNOSIS — L089 Local infection of the skin and subcutaneous tissue, unspecified: Secondary | ICD-10-CM

## 2016-11-09 DIAGNOSIS — A4902 Methicillin resistant Staphylococcus aureus infection, unspecified site: Secondary | ICD-10-CM

## 2016-11-09 LAB — CBC WITH DIFFERENTIAL/PLATELET
Basophils Absolute: 0 cells/uL (ref 0–200)
Basophils Relative: 0 %
Eosinophils Absolute: 32 cells/uL (ref 15–500)
Eosinophils Relative: 1 %
HCT: 39 % (ref 35.0–45.0)
Hemoglobin: 12.6 g/dL (ref 11.7–15.5)
Lymphocytes Relative: 44 %
Lymphs Abs: 1408 cells/uL (ref 850–3900)
MCH: 28.4 pg (ref 27.0–33.0)
MCHC: 32.3 g/dL (ref 32.0–36.0)
MCV: 87.8 fL (ref 80.0–100.0)
MPV: 9.7 fL (ref 7.5–12.5)
Monocytes Absolute: 288 cells/uL (ref 200–950)
Monocytes Relative: 9 %
Neutro Abs: 1472 cells/uL — ABNORMAL LOW (ref 1500–7800)
Neutrophils Relative %: 46 %
Platelets: 286 10*3/uL (ref 140–400)
RBC: 4.44 MIL/uL (ref 3.80–5.10)
RDW: 14.4 % (ref 11.0–15.0)
WBC: 3.2 10*3/uL — ABNORMAL LOW (ref 3.8–10.8)

## 2016-11-09 LAB — COMPLETE METABOLIC PANEL WITH GFR
ALT: 17 U/L (ref 6–29)
AST: 19 U/L (ref 10–30)
Albumin: 4 g/dL (ref 3.6–5.1)
Alkaline Phosphatase: 59 U/L (ref 33–115)
BUN: 13 mg/dL (ref 7–25)
CO2: 24 mmol/L (ref 20–31)
Calcium: 9.1 mg/dL (ref 8.6–10.2)
Chloride: 106 mmol/L (ref 98–110)
Creat: 0.98 mg/dL (ref 0.50–1.10)
GFR, Est African American: 82 mL/min (ref >=60)
GFR, Est Non African American: 71 mL/min (ref >=60)
Glucose, Bld: 66 mg/dL (ref 65–99)
Potassium: 3.7 mmol/L (ref 3.5–5.3)
Sodium: 139 mmol/L (ref 135–146)
Total Bilirubin: 0.4 mg/dL (ref 0.2–1.2)
Total Protein: 7.1 g/dL (ref 6.1–8.1)

## 2016-11-09 MED ORDER — DARUNAVIR-COBICISTAT 800-150 MG PO TABS: 1.0000 | ORAL_TABLET | Freq: Every day | ORAL | 11 refills | Status: DC

## 2016-11-09 MED ORDER — SULFAMETHOXAZOLE-TRIMETHOPRIM 800-160 MG PO TABS: 1.0000 | ORAL_TABLET | Freq: Two times a day (BID) | ORAL | 0 refills | Status: DC

## 2016-11-09 MED ORDER — EMTRICITABINE-TENOFOVIR AF 200-25 MG PO TABS: 1.0000 | ORAL_TABLET | Freq: Every day | ORAL | 11 refills | Status: DC
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
## 2016-11-10 LAB — T-HELPER CELL (CD4) - (RCID CLINIC ONLY)
CD4 % Helper T Cell: 49 % (ref 33–55)
CD4 T Cell Abs: 620 /uL (ref 400–2700)

## 2016-11-10 LAB — RPR

## 2016-11-11 LAB — HIV-1 RNA,QN PCR W/REFLEX GENOTYPE
HIV-1 RNA, QN PCR: <1.3 Log cps/mL
HIV-1 RNA, QN PCR: <20 Copies/mL

## 2016-12-04 ENCOUNTER — Encounter (HOSPITAL_COMMUNITY): Payer: Self-pay

## 2016-12-04 ENCOUNTER — Emergency Department (HOSPITAL_COMMUNITY)
Admission: EM | Admit: 2016-12-04 | Discharge: 2016-12-04 | Disposition: A | Discharge status: Home / Self Care | Payer: Medicaid Other | Attending: Emergency Medicine | Admitting: Emergency Medicine

## 2016-12-04 DIAGNOSIS — B349 Viral infection, unspecified: Secondary | ICD-10-CM | POA: Insufficient documentation

## 2016-12-04 DIAGNOSIS — R197 Diarrhea, unspecified: Secondary | ICD-10-CM | POA: Diagnosis not present

## 2016-12-04 DIAGNOSIS — R110 Nausea: Secondary | ICD-10-CM | POA: Diagnosis not present

## 2016-12-04 DIAGNOSIS — R109 Unspecified abdominal pain: Secondary | ICD-10-CM | POA: Diagnosis present

## 2016-12-04 DIAGNOSIS — Z87891 Personal history of nicotine dependence: Secondary | ICD-10-CM | POA: Insufficient documentation

## 2016-12-04 LAB — HEPATIC FUNCTION PANEL
ALT: 16 U/L (ref 14–54)
AST: 23 U/L (ref 15–41)
Albumin: 4 g/dL (ref 3.5–5.0)
Alkaline Phosphatase: 59 U/L (ref 38–126)
Bilirubin, Direct: 0.1 mg/dL (ref 0.1–0.5)
Indirect Bilirubin: 0.7 mg/dL (ref 0.3–0.9)
Total Bilirubin: 0.8 mg/dL (ref 0.3–1.2)
Total Protein: 7.8 g/dL (ref 6.5–8.1)

## 2016-12-04 LAB — CBC WITH DIFFERENTIAL/PLATELET
Basophils Absolute: 0 10*3/uL (ref 0.0–0.1)
Basophils Relative: 0 %
Eosinophils Absolute: 0 10*3/uL (ref 0.0–0.7)
Eosinophils Relative: 1 %
HCT: 36.5 % (ref 36.0–46.0)
Hemoglobin: 12.4 g/dL (ref 12.0–15.0)
Lymphocytes Relative: 15 %
Lymphs Abs: 0.8 10*3/uL (ref 0.7–4.0)
MCH: 28.3 pg (ref 26.0–34.0)
MCHC: 34 g/dL (ref 30.0–36.0)
MCV: 83.3 fL (ref 78.0–100.0)
Monocytes Absolute: 0.5 10*3/uL (ref 0.1–1.0)
Monocytes Relative: 10 %
Neutro Abs: 3.8 10*3/uL (ref 1.7–7.7)
Neutrophils Relative %: 74 %
Platelets: 253 10*3/uL (ref 150–400)
RBC: 4.38 MIL/uL (ref 3.87–5.11)
RDW: 14.3 % (ref 11.5–15.5)
WBC: 5.2 10*3/uL (ref 4.0–10.5)

## 2016-12-04 LAB — BASIC METABOLIC PANEL
Anion gap: 7 (ref 5–15)
BUN: 14 mg/dL (ref 6–20)
CO2: 24 mmol/L (ref 22–32)
Calcium: 9.3 mg/dL (ref 8.9–10.3)
Chloride: 105 mmol/L (ref 101–111)
Creatinine, Ser: 0.94 mg/dL (ref 0.44–1.00)
GFR calc Af Amer: >60 mL/min (ref >60)
GFR calc non Af Amer: >60 mL/min (ref >60)
Glucose, Bld: 104 mg/dL — ABNORMAL HIGH (ref 65–99)
Potassium: 3.6 mmol/L (ref 3.5–5.1)
Sodium: 136 mmol/L (ref 135–145)

## 2016-12-04 LAB — URINALYSIS, ROUTINE W REFLEX MICROSCOPIC
Bacteria, UA: NONE SEEN
Bilirubin Urine: NEGATIVE
Glucose, UA: NEGATIVE mg/dL
Ketones, ur: NEGATIVE mg/dL
Leukocytes, UA: NEGATIVE
Nitrite: NEGATIVE
Protein, ur: 30 mg/dL — AB
Specific Gravity, Urine: 1.025 (ref 1.005–1.030)
pH: 5 (ref 5.0–8.0)

## 2016-12-04 LAB — PREGNANCY, URINE: Preg Test, Ur: NEGATIVE

## 2016-12-04 LAB — LIPASE, BLOOD: Lipase: 25 U/L (ref 11–51)

## 2016-12-04 MED ORDER — ACETAMINOPHEN 325 MG PO TABS
650.0000 mg | ORAL_TABLET | Freq: Once | ORAL | Status: AC
  Administered 2016-12-04: 650 mg via ORAL
  Filled 2016-12-04: qty 2

## 2016-12-04 MED ORDER — ONDANSETRON HCL 4 MG PO TABS: 4.0000 mg | ORAL_TABLET | Freq: Four times a day (QID) | ORAL | 0 refills | Status: DC

## 2016-12-04 MED ORDER — ONDANSETRON HCL 4 MG/2ML IJ SOLN
4.0000 mg | Freq: Once | INTRAMUSCULAR | Status: AC
  Administered 2016-12-04: 4 mg via INTRAVENOUS
  Filled 2016-12-04: qty 2

## 2016-12-04 MED ORDER — SODIUM CHLORIDE 0.9 % IV BOLUS (SEPSIS)
1000.0000 mL | Freq: Once | INTRAVENOUS | Status: AC
  Administered 2016-12-04: 1000 mL via INTRAVENOUS

## 2016-12-04 NOTE — ED Notes (Signed)
Pt attempting stool sample at present time.

## 2016-12-04 NOTE — ED Triage Notes (Signed)
c/o abdominal pain with diarrhea no n/v voiced no fever since Wednesday no dysuria voiced no vaginal discharge voiced.

## 2016-12-04 NOTE — ED Provider Notes (Signed)
[facility] DEPT Provider Note   CSN: [id number] Arrival date & time: 12/04/16  0205     History   Chief Complaint Chief Complaint  Patient presents with  . Abdominal Pain    HPI Dawn [name] is a 46 y.o. female with history of HIV with most recent CD4 count of 620 who presents with a 2 day history of nonbloody diarrhea with intermittent abdominal cramping. Patient states that she feels like she is having diarrhea every 5 minutes. Patient has also had intermittent nausea, but no vomiting. She tolerates fluids. Patient states that she stays at a shelter and other people have been sick with similar illness. Patient denies any recent new medicines. She states she has not been able to take her regular medicines as consistently as usual due to her shelter stay. She has had some lightheadedness with standing since her onset. Patient symptoms started after eating a salad and some fruit. Patient denies any fevers, chest pain, shortness of breath, urinary symptoms, or abnormal vaginal discharge.   HPI  Past Medical History:  Diagnosis Date  . Anemia   . Anxiety   . Fibroid   . Headache(784.0)   . HIV (human immunodeficiency virus infection) ([place])   . Infection    UTI    Patient Active Problem List   Diagnosis Date Noted  . Anemia due to chronic blood loss 03/13/2016  . Acquired immunodeficiency syndrome ([place]) 12/07/2015  . Acne vulgaris 12/07/2015  . Episodic mood disorder ([place]) 12/07/2015    Past Surgical History:  Procedure Laterality Date  . DILATION AND CURETTAGE OF UTERUS    . HAND TENDON SURGERY     right thumb  . INDUCED ABORTION    . TUBAL LIGATION      OB History    Gravida Para Term Preterm AB Living   6 4 4   2 4    SAB TAB Ectopic Multiple Live Births   1 1             Home Medications    Prior to Admission medications   Medication Sig Start Date End Date Taking? Authorizing Provider  darunavir-cobicistat (PREZCOBIX) 800-150 MG tablet Take 1 tablet  by mouth daily with breakfast. Swallow whole. Do NOT crush, break or chew tablets. Take with food. 11/09/16  Yes Carlyle Basques, MD  emtricitabine-tenofovir AF (DESCOVY) 200-25 MG tablet Take 1 tablet by mouth daily. 11/09/16  Yes Carlyle Basques, MD  valACYclovir (VALTREX) 1000 MG tablet Take 1,000 mg by mouth 2 (two) times daily as needed (outbreak).    Yes Historical Provider, MD  ondansetron (ZOFRAN) 4 MG tablet Take 1 tablet (4 mg total) by mouth every 6 (six) hours. 12/04/16   Frederica Kuster, PA-C  sulfamethoxazole-trimethoprim (BACTRIM DS,SEPTRA DS) 800-160 MG tablet Take 1 tablet by mouth 2 (two) times daily. For 7 days, then 1 tab daily Patient not taking: Reported on 12/04/2016 11/09/16   Carlyle Basques, MD    Family History Family History  Problem Relation Age of Onset  . Family history unknown: Yes    Social History Social History  Substance Use Topics  . Smoking status: Former Smoker    Types: Cigarettes  . Smokeless tobacco: Never Used  . Alcohol use 0.0 oz/week     Comment: hx of alcohol abuse- social drinker now     Allergies   Chocolate and Tomato   Review of Systems Review of Systems  Constitutional: Negative for chills and fever.  HENT: Negative for facial swelling and  sore throat.   Respiratory: Negative for shortness of breath.   Cardiovascular: Negative for chest pain.  Gastrointestinal: Positive for abdominal pain (intermittent cramping), diarrhea and nausea. Negative for blood in stool and vomiting.  Genitourinary: Negative for dysuria.  Musculoskeletal: Negative for back pain.  Skin: Negative for rash and wound.  Neurological: Negative for headaches.  Psychiatric/Behavioral: The patient is not nervous/anxious.      Physical Exam Updated Vital Signs BP 128/82 (BP Location: Left Arm)   Pulse 80   Temp 98.9 F (37.2 C) (Oral)   Resp 18   Wt 70.3 kg   SpO2 100%   BMI 24.28 kg/m   Physical Exam  Constitutional: She appears well-developed and  well-nourished. No distress.  HENT:  Head: Normocephalic and atraumatic.  Mouth/Throat: Oropharynx is clear and moist. No oropharyngeal exudate.  Eyes: Conjunctivae and EOM are normal. Pupils are equal, round, and reactive to light. Right eye exhibits no discharge. Left eye exhibits no discharge. No scleral icterus.  Neck: Normal range of motion. Neck supple. No thyromegaly present.  Cardiovascular: Normal rate, regular rhythm, normal heart sounds and intact distal pulses.  Exam reveals no gallop and no friction rub.   No murmur heard. Pulmonary/Chest: Effort normal and breath sounds normal. No stridor. No respiratory distress. She has no wheezes. She has no rales.  Abdominal: Soft. Bowel sounds are normal. She exhibits no distension. There is no tenderness. There is no rebound and no guarding.  Musculoskeletal: She exhibits no edema.  Lymphadenopathy:    She has no cervical adenopathy.  Neurological: She is alert. Coordination normal.  Skin: Skin is warm and dry. No rash noted. She is not diaphoretic. No pallor.  Psychiatric: She has a normal mood and affect.  Nursing note and vitals reviewed.    ED Treatments / Results  Labs (all labs ordered are listed, but only abnormal results are displayed) Labs Reviewed  BASIC METABOLIC PANEL - Abnormal; Notable for the following:       Result Value   Glucose, Bld 104 (*)    All other components within normal limits  URINALYSIS, ROUTINE W REFLEX MICROSCOPIC - Abnormal; Notable for the following:    Hgb urine dipstick MODERATE (*)    Protein, ur 30 (*)    Squamous Epithelial / LPF 0-5 (*)    All other components within normal limits  GASTROINTESTINAL PANEL BY PCR, STOOL (REPLACES STOOL CULTURE)  C DIFFICILE QUICK SCREEN W PCR REFLEX  LIPASE, BLOOD  CBC WITH DIFFERENTIAL/PLATELET  PREGNANCY, URINE  HEPATIC FUNCTION PANEL    EKG  EKG Interpretation None       Radiology No results found.  Procedures Procedures (including  critical care time)  Medications Ordered in ED Medications  sodium chloride 0.9 % bolus 1,000 mL (0 mLs Intravenous Stopped 12/04/16 0919)  acetaminophen (TYLENOL) tablet 650 mg (650 mg Oral Given 12/04/16 0924)  ondansetron (ZOFRAN) injection 4 mg (4 mg Intravenous Given 12/04/16 0924)     Initial Impression / Assessment and Plan / ED Course  I have reviewed the triage vital signs and the nursing notes.  Pertinent labs & imaging results that were available during my care of the patient were reviewed by me and considered in my medical decision making (see chart for details).     Patient with symptoms consistent with viral gastroenteritis.  Vitals are stable, no fever.  No signs of dehydration, tolerating PO fluids > 6 oz.  Lungs are clear.  No focal abdominal pain, no concern for  appendicitis, cholecystitis, pancreatitis, ruptured viscus, UTI, kidney stone, or any other abdominal etiology.  Patient given fluids in the ED with good resolution of symptoms. Patient no longer lightheaded with standing and states she is feeling well comfortable to go home. Patient was not able to provide stool sample in ED (having less frequent diarrhea), however other labs unremarkable. UA shows moderate hematuria, however patient has been spotting due to her Depo-Provera injection. Supportive therapy indicated with return if symptoms worsen.  Follow-up to PCP as needed. Discharged home with Zofran. Patient stands and agrees plan. Patient stable throughout ED course and discharged in satisfactory condition.  Final Clinical Impressions(s) / ED Diagnoses   Final diagnoses:  Nausea  Viral syndrome  Diarrhea, unspecified type    New Prescriptions New Prescriptions   ONDANSETRON (ZOFRAN) 4 MG TABLET    Take 1 tablet (4 mg total) by mouth every 6 (six) hours.     Frederica Kuster, PA-C 12/04/16 [place], MD 12/04/16 2108

## 2016-12-04 NOTE — Discharge Instructions (Signed)
Medications: Zofran  Treatment: Take Zofran every 6 hours as needed for nausea or vomiting. Patient to drink plenty of fluids, water or Gatorade is best.  Follow-up: Please follow-up with your primary care provider if your symptoms are not improving over the next few days. Please return to emergency department if you develop any new or worsening symptoms including intractable vomiting, persistent diarrhea, especially if it is bloody, passing out, worsening lightheadedness, or any other concerning symptoms.

## 2016-12-04 NOTE — ED Notes (Signed)
Dawn [name] from main lab verbalizes will add on HFP with labs already collected and sent.

## 2016-12-05 ENCOUNTER — Other Ambulatory Visit: Payer: Self-pay | Admitting: Internal Medicine

## 2016-12-05 DIAGNOSIS — B20 Human immunodeficiency virus [HIV] disease: Secondary | ICD-10-CM

## 2016-12-07 ENCOUNTER — Ambulatory Visit: Payer: Medicaid Other | Admitting: Internal Medicine

## 2016-12-27 ENCOUNTER — Encounter (HOSPITAL_COMMUNITY): Payer: Self-pay | Admitting: Family Medicine

## 2016-12-27 ENCOUNTER — Encounter (HOSPITAL_COMMUNITY): Payer: Self-pay | Admitting: Emergency Medicine

## 2016-12-27 ENCOUNTER — Ambulatory Visit (HOSPITAL_COMMUNITY)
Admission: EM | Admit: 2016-12-27 | Discharge: 2016-12-27 | Disposition: A | Discharge status: Home / Self Care | Payer: Medicaid Other | Attending: Emergency Medicine | Admitting: Emergency Medicine

## 2016-12-27 DIAGNOSIS — R109 Unspecified abdominal pain: Secondary | ICD-10-CM | POA: Diagnosis present

## 2016-12-27 DIAGNOSIS — R1012 Left upper quadrant pain: Secondary | ICD-10-CM

## 2016-12-27 DIAGNOSIS — Z87891 Personal history of nicotine dependence: Secondary | ICD-10-CM | POA: Insufficient documentation

## 2016-12-27 DIAGNOSIS — R1013 Epigastric pain: Secondary | ICD-10-CM | POA: Insufficient documentation

## 2016-12-27 DIAGNOSIS — Z79899 Other long term (current) drug therapy: Secondary | ICD-10-CM | POA: Insufficient documentation

## 2016-12-27 LAB — URINALYSIS, ROUTINE W REFLEX MICROSCOPIC
Bilirubin Urine: NEGATIVE
Glucose, UA: NEGATIVE mg/dL
Ketones, ur: NEGATIVE mg/dL
Leukocytes, UA: NEGATIVE
Nitrite: NEGATIVE
Protein, ur: NEGATIVE mg/dL
Specific Gravity, Urine: 1.016 (ref 1.005–1.030)
pH: 7 (ref 5.0–8.0)

## 2016-12-27 LAB — POCT PREGNANCY, URINE: Preg Test, Ur: NEGATIVE

## 2016-12-27 LAB — POCT URINALYSIS DIP (DEVICE)
Bilirubin Urine: NEGATIVE
Glucose, UA: NEGATIVE mg/dL
Hgb urine dipstick: NEGATIVE
Ketones, ur: NEGATIVE mg/dL
Leukocytes, UA: NEGATIVE
Nitrite: NEGATIVE
Protein, ur: NEGATIVE mg/dL
Specific Gravity, Urine: 1.015 (ref 1.005–1.030)
Urobilinogen, UA: 0.2 mg/dL (ref 0.0–1.0)
pH: 7 (ref 5.0–8.0)

## 2016-12-27 LAB — CBC
HCT: 36.5 % (ref 36.0–46.0)
Hemoglobin: 11.5 g/dL — ABNORMAL LOW (ref 12.0–15.0)
MCH: 26.9 pg (ref 26.0–34.0)
MCHC: 31.5 g/dL (ref 30.0–36.0)
MCV: 85.3 fL (ref 78.0–100.0)
Platelets: 242 10*3/uL (ref 150–400)
RBC: 4.28 MIL/uL (ref 3.87–5.11)
RDW: 14.4 % (ref 11.5–15.5)
WBC: 4.1 10*3/uL (ref 4.0–10.5)

## 2016-12-27 LAB — COMPREHENSIVE METABOLIC PANEL
ALT: 17 U/L (ref 14–54)
AST: 19 U/L (ref 15–41)
Albumin: 3.7 g/dL (ref 3.5–5.0)
Alkaline Phosphatase: 53 U/L (ref 38–126)
Anion gap: 8 (ref 5–15)
BUN: 10 mg/dL (ref 6–20)
CO2: 26 mmol/L (ref 22–32)
Calcium: 9.4 mg/dL (ref 8.9–10.3)
Chloride: 103 mmol/L (ref 101–111)
Creatinine, Ser: 0.93 mg/dL (ref 0.44–1.00)
GFR calc Af Amer: >60 mL/min (ref >60)
GFR calc non Af Amer: >60 mL/min (ref >60)
Glucose, Bld: 89 mg/dL (ref 65–99)
Potassium: 3.6 mmol/L (ref 3.5–5.1)
Sodium: 137 mmol/L (ref 135–145)
Total Bilirubin: 0.9 mg/dL (ref 0.3–1.2)
Total Protein: 7.8 g/dL (ref 6.5–8.1)

## 2016-12-27 LAB — LIPASE, BLOOD: Lipase: 38 U/L (ref 11–51)

## 2016-12-27 LAB — I-STAT BETA HCG BLOOD, ED (MC, WL, AP ONLY): I-stat hCG, quantitative: <5 m[IU]/mL (ref <5)

## 2016-12-27 NOTE — ED Triage Notes (Signed)
Pt here for URI symptoms for a few weeks. sts also that she has been having abd cramps, N,V. Sts she is currently living in a shelter.
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
## 2016-12-27 NOTE — ED Triage Notes (Signed)
Patient here with multiple complaints.  Patient sent from [facility] to ED for more work up.  Patient having abdominal pain , nausea and vomiting.

## 2016-12-28 ENCOUNTER — Emergency Department (HOSPITAL_COMMUNITY)
Admission: EM | Admit: 2016-12-28 | Discharge: 2016-12-28 | Disposition: A | Discharge status: Home / Self Care | Payer: Medicaid Other | Attending: Emergency Medicine | Admitting: Emergency Medicine

## 2016-12-28 DIAGNOSIS — R1013 Epigastric pain: Secondary | ICD-10-CM

## 2016-12-28 NOTE — Discharge Instructions (Signed)
Use Tylenol if needed for pain.  Follow-up with a primary care doctor for a checkup in 3-5 days.

## 2016-12-28 NOTE — ED Notes (Signed)
Provided patient with gingerale & graham crackers with peanut butter.

## 2016-12-28 NOTE — ED Provider Notes (Signed)
[facility] DEPT Provider Note   CSN: [id number] Arrival date & time: 12/27/16  2103   By signing my name below, I, Charolotte Eke, attest that this documentation has been prepared under the direction and in the presence of Daleen Bo, MD. Electronically Signed: Charolotte Eke, Scribe. 12/28/16. 6:53 AM.    History   Chief Complaint Chief Complaint  Patient presents with  . Abdominal Pain  . URI    HPI Dawn [name] is a 46 y.o. female who presents to the Emergency Department complaining of moderate, chronic, persistent abdominal pain above her umbilicus that began weeks ago. Pt has associated trouble breathing, gassiness. She reports [facility] diet issues at the shelter. Pt also complains about around sick contacts with her immune deficiency. Pt was tested for HIV in 1997 and has been taking medicine for it since then. Pt states that she had a tubal ligation. Pt had a pregnancy test in the ED. Pt took a bus to the urgent care tonight. Pt requested a note for drug rehab class. Pt also states that she missed curfew at the shelter. Pt states that she would like something light to eat in the ED. Pt denies urinary symptoms, constipation.   The history is provided by the patient. No language interpreter was used.    Past Medical History:  Diagnosis Date  . Anemia   . Anxiety   . Fibroid   . Headache(784.0)   . HIV (human immunodeficiency virus infection) ([place])   . Infection    UTI    Patient Active Problem List   Diagnosis Date Noted  . Anemia due to chronic blood loss 03/13/2016  . Acquired immunodeficiency syndrome ([place]) 12/07/2015  . Acne vulgaris 12/07/2015  . Episodic mood disorder ([place]) 12/07/2015    Past Surgical History:  Procedure Laterality Date  . DILATION AND CURETTAGE OF UTERUS    . HAND TENDON SURGERY     right thumb  . INDUCED ABORTION    . TUBAL LIGATION      OB History    Gravida Para Term Preterm AB Living   6 4 4   2 4    SAB TAB Ectopic Multiple Live  Births   1 1             Home Medications    Prior to Admission medications   Medication Sig Start Date End Date Taking? Authorizing Provider  darunavir-cobicistat (PREZCOBIX) 800-150 MG tablet Take 1 tablet by mouth daily with breakfast. Swallow whole. Do NOT crush, break or chew tablets. Take with food. 11/09/16   Carlyle Basques, MD  emtricitabine-tenofovir AF (DESCOVY) 200-25 MG tablet Take 1 tablet by mouth daily. 11/09/16   Carlyle Basques, MD  ondansetron (ZOFRAN) 4 MG tablet Take 1 tablet (4 mg total) by mouth every 6 (six) hours. 12/04/16   Frederica Kuster, PA-C  sulfamethoxazole-trimethoprim (BACTRIM DS,SEPTRA DS) 800-160 MG tablet Take 1 tablet by mouth 2 (two) times daily. For 7 days, then 1 tab daily Patient not taking: Reported on 12/04/2016 11/09/16   Carlyle Basques, MD  valACYclovir (VALTREX) 1000 MG tablet Take 1,000 mg by mouth 2 (two) times daily as needed (outbreak).     Historical Provider, MD    Family History Family History  Problem Relation Age of Onset  . Family history unknown: Yes    Social History Social History  Substance Use Topics  . Smoking status: Former Smoker    Types: Cigarettes  . Smokeless tobacco: Never Used  . Alcohol use 0.0 oz/week  Comment: hx of alcohol abuse- social drinker now     Allergies   Chocolate and Tomato   Review of Systems Review of Systems  All other systems reviewed and are negative.    Physical Exam Updated Vital Signs BP (!) 137/96 (BP Location: Right Arm)   Pulse 90   Temp 98.4 F (36.9 C) (Oral)   Resp 18   SpO2 100%   Physical Exam  Constitutional: She is oriented to person, place, and time. She appears well-developed and well-nourished.  HENT:  Head: Normocephalic and atraumatic.  Eyes: Conjunctivae and EOM are normal. Pupils are equal, round, and reactive to light.  Neck: Normal range of motion and phonation normal. Neck supple.  Cardiovascular: Normal rate and regular rhythm.   Pulmonary/Chest:  Effort normal and breath sounds normal. She exhibits no tenderness.  Abdominal: Soft. She exhibits no distension. There is tenderness. There is no guarding.  Bilateral upper quadrant tenderness mild.  Musculoskeletal: Normal range of motion.  Neurological: She is alert and oriented to person, place, and time. She exhibits normal muscle tone.  Skin: Skin is warm and dry.  Psychiatric: She has a normal mood and affect. Her behavior is normal. Judgment and thought content normal.  Nursing note and vitals reviewed.    ED Treatments / Results   DIAGNOSTIC STUDIES: Oxygen Saturation is 100% on room air, normal by my interpretation.    COORDINATION OF CARE: 1:33 AM Discussed treatment plan with pt at bedside and pt agreed to plan, which includes a [place] sandwhich.    Labs (all labs ordered are listed, but only abnormal results are displayed) Labs Reviewed  CBC - Abnormal; Notable for the following:       Result Value   Hemoglobin 11.5 (*)    All other components within normal limits  URINALYSIS, ROUTINE W REFLEX MICROSCOPIC - Abnormal; Notable for the following:    Hgb urine dipstick MODERATE (*)    Bacteria, UA RARE (*)    Squamous Epithelial / LPF 0-5 (*)    All other components within normal limits  LIPASE, BLOOD  COMPREHENSIVE METABOLIC PANEL  I-STAT BETA HCG BLOOD, ED ([facility], [facility], [facility] ONLY)    EKG  EKG Interpretation None       Radiology No results found.  Procedures Procedures (including critical care time)  Medications Ordered in ED Medications - No data to display   Initial Impression / Assessment and Plan / ED Course  I have reviewed the triage vital signs and the nursing notes.  Pertinent labs & imaging results that were available during my care of the patient were reviewed by me and considered in my medical decision making (see chart for details).     Medications - No data to display  Patient Vitals for the past 24 hrs:  BP Temp Temp src Pulse Resp  SpO2  12/28/16 0154 (!) 137/96 - - 90 18 100 %  12/28/16 0100 126/86 - - 84 - 100 %  12/28/16 0053 (!) 140/97 - - 92 16 100 %  12/27/16 2120 (!) 143/86 98.4 F (36.9 C) Oral 81 16 99 %    At D/C Reevaluation with update and discussion. After initial assessment and treatment, an updated evaluation reveals no further complaints, findings discussed with the patient and all questions answered. Maika Mcelveen L    Final Clinical Impressions(s) / ED Diagnoses   Final diagnoses:  Epigastric pain   Nonspecific abdominal pain, with reassuring evaluation.  Doubt bowel obstruction, serious bacterial infection or metabolic  instability.  Nursing Notes Reviewed/ Care Coordinated Applicable Imaging Reviewed Interpretation of Laboratory Data incorporated into ED treatment  The patient appears reasonably screened and/or stabilized for discharge and I doubt any other medical condition or other [facility] requiring further screening, evaluation, or treatment in the ED at this time prior to discharge.  Plan: Home Medications- APAP for pain; Home Treatments- rest; return here if the recommended treatment, does not improve the symptoms; Recommended follow up- PCP 1 week for check up    New Prescriptions Discharge Medication List as of 12/28/2016  1:49 AM     I personally performed the services described in this documentation, which was scribed in my presence. The recorded information has been reviewed and is accurate.     Daleen Bo, MD 12/28/16 [phone]

## 2017-03-05 ENCOUNTER — Ambulatory Visit (INDEPENDENT_AMBULATORY_CARE_PROVIDER_SITE_OTHER): Payer: Medicaid Other | Admitting: Internal Medicine

## 2017-03-05 ENCOUNTER — Encounter: Payer: Self-pay | Admitting: Internal Medicine

## 2017-03-05 VITALS — BP 146/91 | HR 90 | Temp 98.9°F | Ht 66.0 in | Wt 162.0 lb

## 2017-03-05 DIAGNOSIS — R030 Elevated blood-pressure reading, without diagnosis of hypertension: Secondary | ICD-10-CM | POA: Diagnosis not present

## 2017-03-05 DIAGNOSIS — K5900 Constipation, unspecified: Secondary | ICD-10-CM | POA: Diagnosis not present

## 2017-03-05 DIAGNOSIS — B20 Human immunodeficiency virus [HIV] disease: Secondary | ICD-10-CM | POA: Diagnosis not present

## 2017-03-05 DIAGNOSIS — I10 Essential (primary) hypertension: Secondary | ICD-10-CM

## 2017-03-05 DIAGNOSIS — R110 Nausea: Secondary | ICD-10-CM

## 2017-03-05 DIAGNOSIS — L0232 Furuncle of buttock: Secondary | ICD-10-CM

## 2017-03-05 LAB — CBC WITH DIFFERENTIAL/PLATELET
Basophils Absolute: 0 cells/uL (ref 0–200)
Basophils Relative: 0 %
Eosinophils Absolute: 62 cells/uL (ref 15–500)
Eosinophils Relative: 2 %
HCT: 38.8 % (ref 35.0–45.0)
Hemoglobin: 12.3 g/dL (ref 11.7–15.5)
Lymphocytes Relative: 44 %
Lymphs Abs: 1364 cells/uL (ref 850–3900)
MCH: 28 pg (ref 27.0–33.0)
MCHC: 31.7 g/dL — ABNORMAL LOW (ref 32.0–36.0)
MCV: 88.2 fL (ref 80.0–100.0)
MPV: 9.9 fL (ref 7.5–12.5)
Monocytes Absolute: 310 cells/uL (ref 200–950)
Monocytes Relative: 10 %
Neutro Abs: 1364 cells/uL — ABNORMAL LOW (ref 1500–7800)
Neutrophils Relative %: 44 %
Platelets: 287 10*3/uL (ref 140–400)
RBC: 4.4 MIL/uL (ref 3.80–5.10)
RDW: 14.3 % (ref 11.0–15.0)
WBC: 3.1 10*3/uL — ABNORMAL LOW (ref 3.8–10.8)

## 2017-03-05 MED ORDER — BISACODYL 5 MG PO TBEC: 5.0000 mg | DELAYED_RELEASE_TABLET | Freq: Every day | ORAL | 0 refills | Status: DC | PRN

## 2017-03-05 MED ORDER — ONDANSETRON 8 MG PO TBDP: 8.0000 mg | ORAL_TABLET | Freq: Three times a day (TID) | ORAL | 0 refills | Status: DC | PRN

## 2017-03-05 NOTE — Progress Notes (Signed)
RFV: follow up for hiv disease  Patient ID: Dawn [name], female   DOB: [birth year], 46 y.o.   MRN: [id number]  HPI Dawn [name] is a 46yo F with hiv disease, CD 620/VL<14 November 2016. Currently on descovy-DRVc. She is doing well with adherence but has missed close to 3 doses in the last month. She states that she still has occasion some discomfort at prior abscess site on her buttock. No other new rashes, or vaginal discharge  ROS: constipation. Feels that her previous I x D of boil on buttocks is slow to heal- she thinks there is retained gauze  Outpatient Encounter Prescriptions as of 03/05/2017  Medication Sig  . darunavir-cobicistat (PREZCOBIX) 800-150 MG tablet Take 1 tablet by mouth daily with breakfast. Swallow whole. Do NOT crush, break or chew tablets. Take with food.  Marland Kitchen emtricitabine-tenofovir AF (DESCOVY) 200-25 MG tablet Take 1 tablet by mouth daily.  . ondansetron (ZOFRAN) 4 MG tablet Take 1 tablet (4 mg total) by mouth every 6 (six) hours. (Patient not taking: Reported on 03/05/2017)  . sulfamethoxazole-trimethoprim (BACTRIM DS,SEPTRA DS) 800-160 MG tablet Take 1 tablet by mouth 2 (two) times daily. For 7 days, then 1 tab daily (Patient not taking: Reported on 12/04/2016)  . valACYclovir (VALTREX) 1000 MG tablet Take 1,000 mg by mouth 2 (two) times daily as needed (outbreak).    No facility-administered encounter medications on file as of 03/05/2017.      Patient Active Problem List   Diagnosis Date Noted  . Anemia due to chronic blood loss 03/13/2016  . Acquired immunodeficiency syndrome ([place]) 12/07/2015  . Acne vulgaris 12/07/2015  . Episodic mood disorder ([place]) 12/07/2015     Health Maintenance Due  Topic Date Due  . TETANUS/TDAP  04/13/1992     Review of Systems Per hpi, otherwise 12 point ros is negative Physical Exam   BP (!) 146/91   Pulse 90   Temp 98.9 F (37.2 C) (Oral)   Ht 5\' 6"  (1.676 m)   Wt 162 lb (73.5 kg)   BMI 26.15 kg/m   Physical Exam    Constitutional:  oriented to person, place, and time. appears well-developed and well-nourished. No distress.  HENT: [place]/AT, PERRLA, no scleral icterus Mouth/Throat: Oropharynx is clear and moist. No oropharyngeal exudate.  Cardiovascular: Normal rate, regular rhythm and normal heart sounds. Exam reveals no gallop and no friction rub.  No murmur heard.  Pulmonary/Chest: Effort normal and breath sounds normal. No respiratory distress.  has no wheezes.  Neck = supple, no nuchal rigidity Abdominal: Soft. Bowel sounds are normal.  exhibits no distension. There is no tenderness.  Lymphadenopathy: no cervical adenopathy. No axillary adenopathy Neurological: alert and oriented to person, place, and time.  Skin: small eschar on right buttock at previous I x D no induration or drainage Psychiatric: a normal mood and affect.  behavior is normal.   Lab Results  Component Value Date   CD4TCELL 49 11/09/2016   Lab Results  Component Value Date   CD4TABS 620 11/09/2016   CD4TABS 320 (L) 03/09/2016   CD4TABS 570 12/09/2015   Lab Results  Component Value Date   HIV1RNAQUANT <20 03/09/2016   Lab Results  Component Value Date   HEPBSAB POS (A) 12/09/2015   Lab Results  Component Value Date   LABRPR NON REAC 11/09/2016    CBC Lab Results  Component Value Date   WBC 4.1 12/27/2016   RBC 4.28 12/27/2016   HGB 11.5 (L) 12/27/2016   HCT 36.5 12/27/2016  PLT 242 12/27/2016   MCV 85.3 12/27/2016   MCH 26.9 12/27/2016   MCHC 31.5 12/27/2016   RDW 14.4 12/27/2016   LYMPHSABS 0.8 12/04/2016   MONOABS 0.5 12/04/2016   EOSABS 0.0 12/04/2016    BMET Lab Results  Component Value Date   NA 137 12/27/2016   K 3.6 12/27/2016   CL 103 12/27/2016   CO2 26 12/27/2016   GLUCOSE 89 12/27/2016   BUN 10 12/27/2016   CREATININE 0.93 12/27/2016   CALCIUM 9.4 12/27/2016   GFRNONAA >60 12/27/2016   GFRAA >60 12/27/2016      Assessment and Plan   HIV= well controlled continue on course.  Will get labs  Constipation = will give her bisacodyl   Nausea = will give prn zofran  HTN = previous visit in march she did not have elevated BP. Will monitor if further recorded episodes of elevated BP to start meds. Will also ask to minimize salt in diet

## 2017-03-05 NOTE — Progress Notes (Signed)
Pt given food from pantry.

## 2017-03-06 LAB — COMPLETE METABOLIC PANEL WITH GFR
ALT: 15 U/L (ref 6–29)
AST: 16 U/L (ref 10–30)
Albumin: 4.1 g/dL (ref 3.6–5.1)
Alkaline Phosphatase: 56 U/L (ref 33–115)
BUN: 10 mg/dL (ref 7–25)
CO2: 23 mmol/L (ref 20–31)
Calcium: 9.1 mg/dL (ref 8.6–10.2)
Chloride: 104 mmol/L (ref 98–110)
Creat: 0.89 mg/dL (ref 0.50–1.10)
GFR, Est African American: >89 mL/min (ref >=60)
GFR, Est Non African American: 80 mL/min (ref >=60)
Glucose, Bld: 88 mg/dL (ref 65–99)
Potassium: 3.9 mmol/L (ref 3.5–5.3)
Sodium: 138 mmol/L (ref 135–146)
Total Bilirubin: 0.3 mg/dL (ref 0.2–1.2)
Total Protein: 7.5 g/dL (ref 6.1–8.1)

## 2017-03-06 LAB — URINE CYTOLOGY ANCILLARY ONLY
Chlamydia: NEGATIVE
Neisseria Gonorrhea: NEGATIVE

## 2017-03-06 LAB — T-HELPER CELL (CD4) - (RCID CLINIC ONLY)
CD4 % Helper T Cell: 47 % (ref 33–55)
CD4 T Cell Abs: 700 /uL (ref 400–2700)

## 2017-03-07 LAB — HIV-1 RNA QUANT-NO REFLEX-BLD
HIV 1 RNA Quant: <20 copies/mL
HIV-1 RNA Quant, Log: <1.3 Log copies/mL

## 2017-03-09 ENCOUNTER — Other Ambulatory Visit (HOSPITAL_COMMUNITY)
Admission: RE | Admit: 2017-03-09 | Discharge: 2017-03-09 | Disposition: A | Discharge status: Home / Self Care | Payer: Medicaid Other | Source: Ambulatory Visit | Attending: Internal Medicine | Admitting: Internal Medicine

## 2017-03-09 ENCOUNTER — Ambulatory Visit (INDEPENDENT_AMBULATORY_CARE_PROVIDER_SITE_OTHER): Payer: Medicaid Other | Admitting: *Deleted

## 2017-03-09 DIAGNOSIS — Z1231 Encounter for screening mammogram for malignant neoplasm of breast: Secondary | ICD-10-CM

## 2017-03-09 DIAGNOSIS — Z124 Encounter for screening for malignant neoplasm of cervix: Secondary | ICD-10-CM | POA: Diagnosis not present

## 2017-03-09 DIAGNOSIS — Z113 Encounter for screening for infections with a predominantly sexual mode of transmission: Secondary | ICD-10-CM | POA: Diagnosis not present

## 2017-03-09 NOTE — Progress Notes (Signed)
Subjective:     Dawn [name] is a 46 y.o. woman who comes in today for a  pap smear only.  Previous abnormal Pap smears: no. Contraception:condoms.  Objective:    There were no vitals taken for this visit.LMP 12/26/16 Pelvic Exam:  Pap smear obtained.   Assessment:    Screening pap smear.   Plan:    Follow up in one year, or as indicated by Pap results.  Patient given condoms.

## 2017-03-12 LAB — CERVICOVAGINAL ANCILLARY ONLY
Chlamydia: NEGATIVE
Neisseria Gonorrhea: NEGATIVE

## 2017-03-12 LAB — CYTOLOGY - PAP
Adequacy: ABSENT
Diagnosis: NEGATIVE

## 2017-03-26 ENCOUNTER — Other Ambulatory Visit: Payer: Self-pay | Admitting: Internal Medicine

## 2017-03-26 DIAGNOSIS — B009 Herpesviral infection, unspecified: Secondary | ICD-10-CM

## 2017-03-30 NOTE — Telephone Encounter (Signed)
See note

## 2017-04-11 ENCOUNTER — Emergency Department (HOSPITAL_COMMUNITY): Payer: Medicaid Other

## 2017-04-11 ENCOUNTER — Emergency Department (HOSPITAL_COMMUNITY)
Admission: EM | Admit: 2017-04-11 | Discharge: 2017-04-11 | Disposition: A | Discharge status: Home / Self Care | Payer: Medicaid Other | Attending: Emergency Medicine | Admitting: Emergency Medicine

## 2017-04-11 ENCOUNTER — Encounter (HOSPITAL_COMMUNITY): Payer: Self-pay | Admitting: Emergency Medicine

## 2017-04-11 DIAGNOSIS — R1084 Generalized abdominal pain: Secondary | ICD-10-CM | POA: Insufficient documentation

## 2017-04-11 DIAGNOSIS — I10 Essential (primary) hypertension: Secondary | ICD-10-CM | POA: Diagnosis not present

## 2017-04-11 DIAGNOSIS — Z87891 Personal history of nicotine dependence: Secondary | ICD-10-CM | POA: Diagnosis not present

## 2017-04-11 DIAGNOSIS — R197 Diarrhea, unspecified: Secondary | ICD-10-CM | POA: Diagnosis not present

## 2017-04-11 DIAGNOSIS — R112 Nausea with vomiting, unspecified: Secondary | ICD-10-CM | POA: Diagnosis not present

## 2017-04-11 DIAGNOSIS — Z79899 Other long term (current) drug therapy: Secondary | ICD-10-CM | POA: Insufficient documentation

## 2017-04-11 LAB — LIPASE, BLOOD: Lipase: 30 U/L (ref 11–51)

## 2017-04-11 LAB — URINALYSIS, ROUTINE W REFLEX MICROSCOPIC
Bilirubin Urine: NEGATIVE
Glucose, UA: NEGATIVE mg/dL
Hgb urine dipstick: NEGATIVE
Ketones, ur: 5 mg/dL — AB
Leukocytes, UA: NEGATIVE
Nitrite: NEGATIVE
Protein, ur: NEGATIVE mg/dL
Specific Gravity, Urine: 1.024 (ref 1.005–1.030)
pH: 6 (ref 5.0–8.0)

## 2017-04-11 LAB — COMPREHENSIVE METABOLIC PANEL
ALT: 18 U/L (ref 14–54)
AST: 23 U/L (ref 15–41)
Albumin: 4.2 g/dL (ref 3.5–5.0)
Alkaline Phosphatase: 51 U/L (ref 38–126)
Anion gap: 6 (ref 5–15)
BUN: 9 mg/dL (ref 6–20)
CO2: 27 mmol/L (ref 22–32)
Calcium: 9 mg/dL (ref 8.9–10.3)
Chloride: 106 mmol/L (ref 101–111)
Creatinine, Ser: 0.94 mg/dL (ref 0.44–1.00)
GFR calc Af Amer: >60 mL/min (ref >60)
GFR calc non Af Amer: >60 mL/min (ref >60)
Glucose, Bld: 93 mg/dL (ref 65–99)
Potassium: 3.4 mmol/L — ABNORMAL LOW (ref 3.5–5.1)
Sodium: 139 mmol/L (ref 135–145)
Total Bilirubin: 0.5 mg/dL (ref 0.3–1.2)
Total Protein: 7.8 g/dL (ref 6.5–8.1)

## 2017-04-11 LAB — CBC
HCT: 35.9 % — ABNORMAL LOW (ref 36.0–46.0)
Hemoglobin: 12 g/dL (ref 12.0–15.0)
MCH: 28.3 pg (ref 26.0–34.0)
MCHC: 33.4 g/dL (ref 30.0–36.0)
MCV: 84.7 fL (ref 78.0–100.0)
Platelets: 253 10*3/uL (ref 150–400)
RBC: 4.24 MIL/uL (ref 3.87–5.11)
RDW: 14.2 % (ref 11.5–15.5)
WBC: 3.3 10*3/uL — ABNORMAL LOW (ref 4.0–10.5)

## 2017-04-11 MED ORDER — ONDANSETRON HCL 4 MG/2ML IJ SOLN
4.0000 mg | Freq: Once | INTRAMUSCULAR | Status: AC
  Administered 2017-04-11: 4 mg via INTRAVENOUS
  Filled 2017-04-11: qty 2

## 2017-04-11 MED ORDER — SODIUM CHLORIDE 0.9 % IV BOLUS (SEPSIS)
1000.0000 mL | Freq: Once | INTRAVENOUS | Status: AC
  Administered 2017-04-11: 1000 mL via INTRAVENOUS

## 2017-04-11 MED ORDER — ONDANSETRON 4 MG PO TBDP: 4.0000 mg | ORAL_TABLET | Freq: Three times a day (TID) | ORAL | 0 refills | Status: DC | PRN

## 2017-04-11 NOTE — Discharge Instructions (Signed)
Drink plenty of fluids and get plenty of rest. Eat a diet  of bland foods that will not upset her stomach. You may take Zofran as needed for nausea. Dr. Storm Frisk office will call you to set up a follow-up appointment this week. Return to the ED immediately if any concerning signs or symptoms develop.

## 2017-04-11 NOTE — ED Notes (Signed)
Pt given ginger ale and saltine crackers to eat.

## 2017-04-11 NOTE — ED Triage Notes (Signed)
Pt comes in after not taking her HIV medicine over the past 2 weeks.  States the medicine is making her sick, endorsing vomiting, diarrhea, and abdominal pain.  Pt states she has not been able to hold any food down.  Pt states her HIV doctor is aware she is taking a break on her medicine. Denies any other complaints at this time.

## 2017-04-11 NOTE — ED Provider Notes (Signed)
[facility] DEPT Provider Note   CSN: [id number] Arrival date & time: 04/11/17  1318     History   Chief Complaint Chief Complaint  Patient presents with  . Emesis  . Diarrhea  . Abdominal Pain    HPI Dawn [name] is a 46 y.o. female with history of HIV, uterine fibroids, anxiety, and HTN who presents today with chief complaint acute onset of nausea, vomiting, and diarrhea since yesterday. She states that she is on 2 HIV medications, Descovy and Prezcobix. She is unsure how long she has been on these medications but notes side effects including nausea, vomiting, and diarrhea. She stopped taking these medications 2 weeks ago and informed her infectious disease specialist Dr. Macwilliams Flattery of this. She states that she took her medication last night at around 8 PM and developed acute onset of persisting nausea, nonbloody nonbilious emesis, and watery nonbloody stools. She also endorses generalized crampy abdominal pain that is intermittent. No aggravating or alleviating factors noted. She has not tried anything for her symptoms. She endorses subjective fevers and chills as well as a nonproductive cough and intermittent shortness of breath and chest tightness. Denies chest pain, dysuria, hematuria, vaginal itching, discharge, or abnormal bleeding. Denies melena, hematochezia, or flank pain. Denies known sick contacts or suspicious food intake.  The history is provided by the patient.    Past Medical History:  Diagnosis Date  . Anemia   . Anxiety   . Fibroid   . Headache(784.0)   . HIV (human immunodeficiency virus infection) ([place])   . Infection    UTI    Patient Active Problem List   Diagnosis Date Noted  . HIV disease ([place]) 03/05/2017  . HTN (hypertension) 03/05/2017  . Anemia due to chronic blood loss 03/13/2016  . Acquired immunodeficiency syndrome ([place]) 12/07/2015  . Acne vulgaris 12/07/2015  . Episodic mood disorder ([place]) 12/07/2015    Past Surgical History:  Procedure  Laterality Date  . DILATION AND CURETTAGE OF UTERUS    . HAND TENDON SURGERY     right thumb  . INDUCED ABORTION    . TUBAL LIGATION      OB History    Gravida Para Term Preterm AB Living   6 4 4   2 4    SAB TAB Ectopic Multiple Live Births   1 1             Home Medications    Prior to Admission medications   Medication Sig Start Date End Date Taking? Authorizing Provider  darunavir-cobicistat (PREZCOBIX) 800-150 MG tablet Take 1 tablet by mouth daily with breakfast. Swallow whole. Do NOT crush, break or chew tablets. Take with food. 11/09/16  Yes Carlyle Basques, MD  emtricitabine-tenofovir AF (DESCOVY) 200-25 MG tablet Take 1 tablet by mouth daily. 11/09/16  Yes Carlyle Basques, MD  bisacodyl (DULCOLAX) 5 MG EC tablet Take 1 tablet (5 mg total) by mouth daily as needed for moderate constipation. Patient not taking: Reported on 04/11/2017 03/05/17   Carlyle Basques, MD  ondansetron (ZOFRAN ODT) 8 MG disintegrating tablet Take 1 tablet (8 mg total) by mouth every 8 (eight) hours as needed for nausea or vomiting. 03/05/17   Carlyle Basques, MD  valACYclovir (VALTREX) 1000 MG tablet TAKE 1 TABLET(1000 MG) BY MOUTH DAILY Patient not taking: Reported on 04/11/2017 03/26/17   Carlyle Basques, MD    Family History Family History  Problem Relation Age of Onset  . Family history unknown: Yes    Social History Social History  Substance  Use Topics  . Smoking status: Former Smoker    Types: Cigarettes  . Smokeless tobacco: Never Used  . Alcohol use 0.0 oz/week     Comment: hx of alcohol abuse- social drinker now     Allergies   Chocolate and Tomato   Review of Systems Review of Systems  Constitutional: Positive for chills and fever.  Respiratory: Positive for cough, chest tightness and shortness of breath.   Cardiovascular: Negative for chest pain.  Gastrointestinal: Positive for abdominal pain, diarrhea, nausea and vomiting.  Genitourinary: Negative for dysuria, hematuria,  vaginal bleeding, vaginal discharge and vaginal pain.  Musculoskeletal: Negative for back pain, neck pain and neck stiffness.  Allergic/Immunologic: Positive for immunocompromised state.  Neurological: Negative for headaches.  All other systems reviewed and are negative.    Physical Exam Updated Vital Signs BP 135/87   Pulse 77   Temp 98.8 F (37.1 C) (Oral)   Resp 16   Ht 5\' 6"  (1.676 m)   Wt 72.6 kg (160 lb)   SpO2 100%   BMI 25.82 kg/m   Physical Exam  Constitutional: She appears well-developed and well-nourished. No distress.  HENT:  Head: Normocephalic and atraumatic.  Eyes: Conjunctivae are normal. Right eye exhibits no discharge. Left eye exhibits no discharge.  Neck: Normal range of motion. Neck supple. No JVD present. No tracheal deviation present.  No midline spine TTP, no paraspinal muscle tenderness, no deformity, crepitus, or step-off noted   Cardiovascular: Normal rate, regular rhythm and normal heart sounds.  Exam reveals no gallop and no friction rub.   No murmur heard. Pulmonary/Chest: Effort normal and breath sounds normal. No respiratory distress. She has no wheezes. She has no rales. She exhibits no tenderness.  Abdominal: Soft. Bowel sounds are normal. She exhibits no distension. There is no tenderness.  No CVA tenderness, Murphy's absent, rovsing's absent  Musculoskeletal: She exhibits no edema.  Neurological: She is alert.  Skin: Skin is warm and dry. No erythema.  Psychiatric: She has a normal mood and affect. Her behavior is normal.  Nursing note and vitals reviewed.    ED Treatments / Results  Labs (all labs ordered are listed, but only abnormal results are displayed) Labs Reviewed  COMPREHENSIVE METABOLIC PANEL - Abnormal; Notable for the following:       Result Value   Potassium 3.4 (*)    All other components within normal limits  CBC - Abnormal; Notable for the following:    WBC 3.3 (*)    HCT 35.9 (*)    All other components within  normal limits  URINALYSIS, ROUTINE W REFLEX MICROSCOPIC - Abnormal; Notable for the following:    Ketones, ur 5 (*)    All other components within normal limits  LIPASE, BLOOD    EKG  EKG Interpretation None       Radiology Dg Chest 2 View  Result Date: 04/11/2017 CLINICAL DATA:  Cough.  HIV disease EXAM: CHEST  2 VIEW COMPARISON:  September 06, 2016 FINDINGS: There is mild stable upper lobe interstitial thickening. There is no apparent edema or consolidation. Heart size and pulmonary vascularity are normal. No evident adenopathy. No bone lesions. IMPRESSION: Stable mild upper lobe interstitial thickening, likely due to chronic inflammatory type change. No frank edema or consolidation. No evident adenopathy. Electronically Signed   By: Lowella Grip III M.D.   On: 04/11/2017 17:26    Procedures Procedures (including critical care time)  Medications Ordered in ED Medications  sodium chloride 0.9 % bolus 1,000  mL (1,000 mLs Intravenous New Bag/Given 04/11/17 1655)  ondansetron (ZOFRAN) injection 4 mg (4 mg Intravenous Given 04/11/17 1656)     Initial Impression / Assessment and Plan / ED Course  I have reviewed the triage vital signs and the nursing notes.  Pertinent labs & imaging results that were available during my care of the patient were reviewed by me and considered in my medical decision making (see chart for details).     Patient with history of HIV presents today with nausea, vomiting, diarrhea, and generalized abdominal pain after taking her medications which have been known to cause the side effects for her. She has been off of these medications for 2 weeks. Afebrile, vital signs are stable. No significant electrolyte abnormalities or leukocytosis. Abdominal examination is unremarkable. Patient is complaining of a dry cough but SPO2 saturation is 100% and chest x-ray shows no acute cardiopulmonary abnormality and I doubt PNE. Last CD4 count was 700 and July she has had  an undetectable viral load for some time. Spoke with infectious disease, Dr. Lofaso Flattery who states that she is stable for discharge home and someone from her clinic will call the patient to establish follow-up this week. On reevaluation, abdominal examination remains unremarkable. She is able to tolerate food and drink while in the ED. Verified phone numbers were correct. Discussed indications for return to the ED. Will discharge with Zofran for nausea. Pt verbalized understanding of and agreement with plan and is safe for discharge home at this time.   Final Clinical Impressions(s) / ED Diagnoses   Final diagnoses:  Nausea vomiting and diarrhea  Generalized abdominal pain    New Prescriptions New Prescriptions   No medications on file     Debroah Baller 04/11/17 [place], MD 04/12/17 [phone]

## 2017-04-11 NOTE — ED Notes (Signed)
Pt ambulatory and independent at discharge.  Awaiting fluids to finish before d/c.  Verbalized understanding of discharge instructions.

## 2017-05-17 ENCOUNTER — Encounter (HOSPITAL_COMMUNITY): Payer: Self-pay | Admitting: Emergency Medicine

## 2017-05-17 ENCOUNTER — Ambulatory Visit (HOSPITAL_COMMUNITY)
Admission: EM | Admit: 2017-05-17 | Discharge: 2017-05-17 | Disposition: A | Discharge status: Home / Self Care | Payer: Medicaid Other

## 2017-05-17 DIAGNOSIS — L0231 Cutaneous abscess of buttock: Secondary | ICD-10-CM

## 2017-05-17 NOTE — ED Notes (Signed)
jpatient has been seen by dentist today, had a tooth removed and has a script for amoxicillin and norco with her.

## 2017-05-17 NOTE — ED Triage Notes (Signed)
Left buttocks with abscess.  Patient has recurring abscess.

## 2017-05-17 NOTE — ED Provider Notes (Signed)
[facility]    CSN: [id number] Arrival date & time: 05/17/17  1505     History   Chief Complaint Chief Complaint  Patient presents with  . Abscess    HPI Dawn [name] is a 46 y.o. female.   Subjective:   Dawn [name] is a 46 y.o. female who presents for evaluation of a probable cutaneous abscess. Lesion is located in the left buttock. Symptoms have waxed and waned but are better overall. Abscess has associated symptoms of spontaneous drainage. Patient does have previous history of cutaneous abscesses. Patient does not have diabetes. No fevers, sweats, chills, nausea or vomiting.    Past medical history, family history, social history, allergies and current medications have been reviewed.         Past Medical History:  Diagnosis Date  . Anemia   . Anxiety   . Fibroid   . Headache(784.0)   . HIV (human immunodeficiency virus infection) ([place])   . Infection    UTI    Patient Active Problem List   Diagnosis Date Noted  . HIV disease ([place]) 03/05/2017  . HTN (hypertension) 03/05/2017  . Anemia due to chronic blood loss 03/13/2016  . Acquired immunodeficiency syndrome ([place]) 12/07/2015  . Acne vulgaris 12/07/2015  . Episodic mood disorder ([place]) 12/07/2015    Past Surgical History:  Procedure Laterality Date  . DILATION AND CURETTAGE OF UTERUS    . HAND TENDON SURGERY     right thumb  . INDUCED ABORTION    . TUBAL LIGATION      OB History    Gravida Para Term Preterm AB Living   6 4 4   2 4    SAB TAB Ectopic Multiple Live Births   1 1             Home Medications    Prior to Admission medications   Medication Sig Start Date End Date Taking? Authorizing Provider  bisacodyl (DULCOLAX) 5 MG EC tablet Take 1 tablet (5 mg total) by mouth daily as needed for moderate constipation. Patient not taking: Reported on 04/11/2017 03/05/17   Carlyle Basques, MD  darunavir-cobicistat (PREZCOBIX) 800-150 MG tablet Take 1 tablet by mouth daily with  breakfast. Swallow whole. Do NOT crush, break or chew tablets. Take with food. 11/09/16   Carlyle Basques, MD  emtricitabine-tenofovir AF (DESCOVY) 200-25 MG tablet Take 1 tablet by mouth daily. 11/09/16   Carlyle Basques, MD  ondansetron (ZOFRAN ODT) 4 MG disintegrating tablet Take 1 tablet (4 mg total) by mouth every 8 (eight) hours as needed for nausea or vomiting. 04/11/17   Nils Flack, Mina A, PA-C  valACYclovir (VALTREX) 1000 MG tablet TAKE 1 TABLET(1000 MG) BY MOUTH DAILY Patient not taking: Reported on 04/11/2017 03/26/17   Carlyle Basques, MD    Family History Family History  Problem Relation Age of Onset  . Family history unknown: Yes    Social History Social History  Substance Use Topics  . Smoking status: Former Smoker    Types: Cigarettes  . Smokeless tobacco: Never Used  . Alcohol use 0.0 oz/week     Comment: hx of alcohol abuse- social drinker now     Allergies   Chocolate and Tomato   Review of Systems Review of Systems  Constitutional: Negative for chills, diaphoresis and fever.  Skin: Positive for wound.  All other systems reviewed and are negative.    Physical Exam Triage Vital Signs ED Triage Vitals  Enc Vitals Group     BP 05/17/17 1532 Marland Kitchen)  142/90     Pulse Rate 05/17/17 1532 86     Resp 05/17/17 1532 18     Temp 05/17/17 1532 99.6 F (37.6 C)     Temp Source 05/17/17 1532 Oral     SpO2 05/17/17 1532 99 %     Weight --      Height --      Head Circumference --      Peak Flow --      Pain Score 05/17/17 1529 10     Pain Loc --      Pain Edu? --      Excl. in [place]? --    No data found.   Updated Vital Signs BP (!) 142/90 (BP Location: Left Arm)   Pulse 86   Temp 99.6 F (37.6 C) (Oral)   Resp 18   SpO2 99%   Visual Acuity Right Eye Distance:   Left Eye Distance:   Bilateral Distance:    Right Eye Near:   Left Eye Near:    Bilateral Near:     Physical Exam  Constitutional: She is oriented to person, place, and time. She appears  well-developed and well-nourished.  Neck: Normal range of motion.  Cardiovascular: Normal rate, regular rhythm and normal heart sounds.   Pulmonary/Chest: Effort normal and breath sounds normal.  Genitourinary:  Genitourinary Comments: There is an area characterized by a subcutaneous mass located to the left buttock region consistent with a cutaneous abscess without any fluctuance, tenderness or purulent drainage.   Musculoskeletal: Normal range of motion.  Neurological: She is alert and oriented to person, place, and time.  Skin: Skin is warm and dry.  Psychiatric: She has a normal mood and affect.     UC Treatments / Results  Labs (all labs ordered are listed, but only abnormal results are displayed) Labs Reviewed - No data to display  EKG  EKG Interpretation None       Radiology No results found.  Procedures Procedures (including critical care time)  Medications Ordered in UC Medications - No data to display   Initial Impression / Assessment and Plan / UC Course  I have reviewed the triage vital signs and the nursing notes.  Pertinent labs & imaging results that were available during my care of the patient were reviewed by me and considered in my medical decision making (see chart for details).     46 y.o. female who presents for evaluation of a probable cutaneous abscess. Exam reveals subcutaneous mass located to the left buttock region consistent with a cutaneous abscess without any fluctuance, tenderness or purulent drainage. Advised patient to apply hot compresses frequently to promote drainage. Reassured that this represents a benign process. RTC PRN.  Evaluation has revealed no signs of a dangerous process. Patient aware of findings. Patient improved. Warned regarding their illness, possible red flag symptoms to watch out for, and need for close follow up. Understands verbal as well as written discharge instructions, is warned regarding their illness and prognosis,  is able to repeat any risks, and is comfortable with plan and disposition. Has a clear mental status at this time, good insight into illness (after discussion and teaching) and has clear judgment to make decisions regarding their care.  Documentation was completed with the aid of voice recognition software. Transcription may contain typographical errors. Final Clinical Impressions(s) / UC Diagnoses   Final diagnoses:  Abscess of buttock, left    New Prescriptions New Prescriptions   No medications on file  Controlled Substance Prescriptions [place] Controlled Substance Registry consulted? Not Applicable   Enrique Sack, [place] 05/17/17 1623

## 2017-05-20 ENCOUNTER — Other Ambulatory Visit: Payer: Self-pay | Admitting: Obstetrics and Gynecology

## 2017-05-20 ENCOUNTER — Inpatient Hospital Stay (HOSPITAL_COMMUNITY)
Admission: AD | Admit: 2017-05-20 | Discharge: 2017-05-20 | Disposition: A | Discharge status: Home / Self Care | Payer: Medicaid Other | Source: Ambulatory Visit | Attending: Obstetrics and Gynecology | Admitting: Obstetrics and Gynecology

## 2017-05-20 DIAGNOSIS — N920 Excessive and frequent menstruation with regular cycle: Secondary | ICD-10-CM | POA: Insufficient documentation

## 2017-05-20 DIAGNOSIS — Z21 Asymptomatic human immunodeficiency virus [HIV] infection status: Secondary | ICD-10-CM | POA: Insufficient documentation

## 2017-05-20 DIAGNOSIS — N939 Abnormal uterine and vaginal bleeding, unspecified: Secondary | ICD-10-CM | POA: Diagnosis present

## 2017-05-20 DIAGNOSIS — Z87891 Personal history of nicotine dependence: Secondary | ICD-10-CM | POA: Diagnosis not present

## 2017-05-20 LAB — CBC
HCT: 33.4 % — ABNORMAL LOW (ref 36.0–46.0)
Hemoglobin: 11.3 g/dL — ABNORMAL LOW (ref 12.0–15.0)
MCH: 28.5 pg (ref 26.0–34.0)
MCHC: 33.8 g/dL (ref 30.0–36.0)
MCV: 84.3 fL (ref 78.0–100.0)
Platelets: 205 10*3/uL (ref 150–400)
RBC: 3.96 MIL/uL (ref 3.87–5.11)
RDW: 13.6 % (ref 11.5–15.5)
WBC: 2 10*3/uL — ABNORMAL LOW (ref 4.0–10.5)

## 2017-05-20 MED ORDER — MEGESTROL ACETATE 40 MG PO TABS: 40.0000 mg | ORAL_TABLET | Freq: Three times a day (TID) | ORAL | 0 refills | Status: AC

## 2017-05-20 MED ORDER — MEGESTROL ACETATE 40 MG PO TABS: 40.0000 mg | ORAL_TABLET | Freq: Every day | ORAL | 0 refills | Status: DC

## 2017-05-20 MED ORDER — MEGESTROL ACETATE 40 MG PO TABS
80.0000 mg | ORAL_TABLET | Freq: Every day | ORAL | Status: DC
  Administered 2017-05-20: 80 mg via ORAL
  Filled 2017-05-20 (×2): qty 2

## 2017-05-20 NOTE — MAU Provider Note (Signed)
History     CSN: [id number]  Arrival date and time: 05/20/17 1511   None     No chief complaint on file. CC: Vaginal bleeding x a week., HIV +, stable, recent vulvar folliculitis, on antibiotics. HPI 46 yr female on antiretroviral tx with pt reported hx ut fibroids and heavy menses, now presents for assistance due to heavy menses and clots , slight fatigue. Arrival via EMS.  Pt has had 3 ED evals in last 6wks, 3 different facilities. Limited use of appointments.   Pertinent Gynecological History: Menses: flow is excessive with use of both pads or tampons on heaviest days Bleeding: now day 7-8 Contraception: tubal ligation DES exposure: unknown Blood transfusions: none Sexually transmitted diseases: past history: HIV  and recent July viral load UNDETECTABLE. Previous GYN Procedures: tubal  Last mammogram: none Date:  Last pap: normal Date: 02/2017   Past Medical History:  Diagnosis Date  . Anemia   . Anxiety   . Fibroid   . Headache(784.0)   . HIV (human immunodeficiency virus infection) ([place])   . Infection    UTI    Past Surgical History:  Procedure Laterality Date  . DILATION AND CURETTAGE OF UTERUS    . HAND TENDON SURGERY     right thumb  . INDUCED ABORTION    . TUBAL LIGATION      Family History  Problem Relation Age of Onset  . Family history unknown: Yes    Social History  Substance Use Topics  . Smoking status: Former Smoker    Types: Cigarettes  . Smokeless tobacco: Never Used  . Alcohol use 0.0 oz/week     Comment: hx of alcohol abuse- social drinker now    Allergies:  Allergies  Allergen Reactions  . Chocolate Hives  . Tomato Hives    Prescriptions Prior to Admission  Medication Sig Dispense Refill Last Dose  . darunavir-cobicistat (PREZCOBIX) 800-150 MG tablet Take 1 tablet by mouth daily with breakfast. Swallow whole. Do NOT crush, break or chew tablets. Take with food. 30 tablet 11 Past Month at Unknown time  .  emtricitabine-tenofovir AF (DESCOVY) 200-25 MG tablet Take 1 tablet by mouth daily. 30 tablet 11 Past Month at Unknown time  . ibuprofen (ADVIL,MOTRIN) 600 MG tablet Take 600 mg by mouth every 6 (six) hours as needed for moderate pain.   Past Week at Unknown time  . bisacodyl (DULCOLAX) 5 MG EC tablet Take 1 tablet (5 mg total) by mouth daily as needed for moderate constipation. (Patient not taking: Reported on 04/11/2017) 30 tablet 0 Not Taking at Unknown time  . ondansetron (ZOFRAN ODT) 4 MG disintegrating tablet Take 1 tablet (4 mg total) by mouth every 8 (eight) hours as needed for nausea or vomiting. (Patient not taking: Reported on 05/20/2017) 10 tablet 0 Not Taking at Unknown time  . valACYclovir (VALTREX) 1000 MG tablet TAKE 1 TABLET(1000 MG) BY MOUTH DAILY (Patient not taking: Reported on 04/11/2017) 30 tablet 5 Not Taking at Unknown time    Review of Systems  No weight loss, hx reportedly known fibroids.  Physical Exam   There were no vitals taken for this visit. There were no vitals taken for this visit.  Physical Exam  Constitutional: She appears well-developed and well-nourished.  HENT:  Head: Normocephalic.  Eyes: Pupils are equal, round, and reactive to light.  Neck: Normal range of motion.  Cardiovascular: Normal rate and normal heart sounds.   Respiratory: Effort normal and breath sounds normal.  GI: Soft.  Skin: Skin is warm.  Physical Examination: Breasts -                                      Physical Examination: Pelvic - VULVA: vulvar excoriation none, vulvar erythema none, vulvar lesion none, vulvar nodule no , VAGINA: normal appearing vagina with normal color and discharge, no lesions, PELVIC FLOOR EXAM: no cystocele, rectocele or prolapse noted, vaginal erythema , CERVIX: normal appearing cervix without discharge or lesions, UTERUS: anteverted, enlarged to 8 week's size, ADNEXA: normal adnexa in size, nontender and no masses, exam chaperoned by RN   MAU Course   Procedures  MDM Hx pex labs  Assessment and Plan  Menrorrhagia  HIV pos No anemia. Plan  Rx Megace 40 tid   Ece C[place] V 05/20/2017, 4:00 PM

## 2017-05-20 NOTE — Progress Notes (Signed)
Placed on Megace 40 mg TID,

## 2017-05-20 NOTE — Discharge Instructions (Signed)
In late 2019, the [facility] will be moving to the [place] campus. At that time, the MAU will no longer serve non-pregnant patients. We encourage you to establish care with a provider before that time, so that you can be seen with any GYN concerns, like vaginal discharge, urinary tract infection, etc.. in a timely manner. In order to make the office visit more convenient, the [facility] will be offering evening hours from 4pm-8pm on Mondays starting 05/07/17. There will be same-day appointments, walk-in appointments and scheduled appointments available during this time.   ° °[facility] - [phone] ° °For urgent needs, [facility] is also available for management of urgent GYN complaints such as vaginal discharge.  ° °[employer] extends eligibility for family planning services to reduce unintended pregnancies and improve the well-being of children and families. ° ° Eligible individuals whose income is at or below 195% of the federal poverty level and who are:  °- [place] citizens, documented immigrants or qualified aliens;  °- Residents of [place];  °- Not incarcerated; and  °- Not pregnant.  ° °[employer] Contact Information:  °Medical Assistance Clinical Section °Phone: [phone] °Email: dma.besmart@[email or web address]  ° ° ° °

## 2017-05-20 NOTE — MAU Note (Signed)
Pt presents to MAU by EMS c/o heavy vaginal bleeding for 3 days, filling a pad in 2-3 minutes with large clots the size of an egg.  Pt has a history of fibroids and has had heavy bleeding in the past.  Pt is HIV positive.

## 2017-06-05 ENCOUNTER — Ambulatory Visit: Payer: Medicaid Other | Admitting: Internal Medicine

## 2017-07-04 ENCOUNTER — Other Ambulatory Visit (HOSPITAL_COMMUNITY)
Admission: RE | Admit: 2017-07-04 | Discharge: 2017-07-04 | Disposition: A | Discharge status: Home / Self Care | Payer: Medicaid Other | Source: Ambulatory Visit | Attending: Internal Medicine | Admitting: Internal Medicine

## 2017-07-04 ENCOUNTER — Ambulatory Visit (INDEPENDENT_AMBULATORY_CARE_PROVIDER_SITE_OTHER): Payer: Medicaid Other | Admitting: Internal Medicine

## 2017-07-04 VITALS — BP 111/77 | HR 93 | Temp 98.8°F | Wt 149.0 lb

## 2017-07-04 DIAGNOSIS — B20 Human immunodeficiency virus [HIV] disease: Secondary | ICD-10-CM | POA: Insufficient documentation

## 2017-07-04 DIAGNOSIS — I10 Essential (primary) hypertension: Secondary | ICD-10-CM | POA: Diagnosis not present

## 2017-07-04 DIAGNOSIS — Z23 Encounter for immunization: Secondary | ICD-10-CM | POA: Insufficient documentation

## 2017-07-04 DIAGNOSIS — N921 Excessive and frequent menstruation with irregular cycle: Secondary | ICD-10-CM

## 2017-07-04 NOTE — Progress Notes (Signed)
RFV: follow up for hiv disease Patient ID: Dawn [name], female   DOB: [birth year], 46 y.o.   MRN: [id number]  HPI  Dawn [name] is a 46yo F with hiv disease, previously well controlled, with Cd 4 count of 700/VL<20 (july 2018) on descovy-prezcobix Records show she has been having several ed visits for menorrhagia in aug/sep. Placed on high dose megace at end of September  - vaginal bleeding has stopped - has a boyfriend that she wants for him to come to appt to get tested  Spent most of the visit discussing her estranged family, how they often "out" her in the community though also want her to come home. She feels hurt by how they have made her feel unwelcomed.  Outpatient Encounter Medications as of 07/04/2017  Medication Sig  . bisacodyl (DULCOLAX) 5 MG EC tablet Take 1 tablet (5 mg total) by mouth daily as needed for moderate constipation. (Patient not taking: Reported on 04/11/2017)  . darunavir-cobicistat (PREZCOBIX) 800-150 MG tablet Take 1 tablet by mouth daily with breakfast. Swallow whole. Do NOT crush, break or chew tablets. Take with food.  Marland Kitchen emtricitabine-tenofovir AF (DESCOVY) 200-25 MG tablet Take 1 tablet by mouth daily.  Marland Kitchen ibuprofen (ADVIL,MOTRIN) 600 MG tablet Take 600 mg by mouth every 6 (six) hours as needed for moderate pain.  Marland Kitchen ondansetron (ZOFRAN ODT) 4 MG disintegrating tablet Take 1 tablet (4 mg total) by mouth every 8 (eight) hours as needed for nausea or vomiting. (Patient not taking: Reported on 05/20/2017)  . valACYclovir (VALTREX) 1000 MG tablet TAKE 1 TABLET(1000 MG) BY MOUTH DAILY (Patient not taking: Reported on 04/11/2017)   No facility-administered encounter medications on file as of 07/04/2017.      Patient Active Problem List   Diagnosis Date Noted  . HIV disease ([place]) 03/05/2017  . HTN (hypertension) 03/05/2017  . Anemia due to chronic blood loss 03/13/2016  . Acquired immunodeficiency syndrome ([place]) 12/07/2015  . Acne vulgaris 12/07/2015  . Episodic  mood disorder ([place]) 12/07/2015     Health Maintenance Due  Topic Date Due  . TETANUS/TDAP  04/13/1992  . INFLUENZA VACCINE  03/28/2017     Review of Systems  Constitutional: Negative for fever, chills, diaphoresis, activity change, appetite change, fatigue and unexpected weight change.  HENT: Negative for congestion, sore throat, rhinorrhea, sneezing, trouble swallowing and sinus pressure.  Eyes: Negative for photophobia and visual disturbance.  Respiratory: Negative for cough, chest tightness, shortness of breath, wheezing and stridor.  Cardiovascular: Negative for chest pain, palpitations and leg swelling.  Gastrointestinal: Negative for nausea, vomiting, abdominal pain, diarrhea, constipation, blood in stool, abdominal distention and anal bleeding.  Genitourinary: Negative for dysuria, hematuria, flank pain and difficulty urinating.  Musculoskeletal: Negative for myalgias, back pain, joint swelling, arthralgias and gait problem.  Skin: Negative for color change, pallor, rash and wound.  Neurological: Negative for dizziness, tremors, weakness and light-headedness.  Hematological: Negative for adenopathy. Does not bruise/bleed easily.  Psychiatric/Behavioral: Negative for behavioral problems, confusion, sleep disturbance, dysphoric mood, decreased concentration and agitation.    Physical Exam   Temp 98.8 F (37.1 C) (Oral)   Wt 149 lb (67.6 kg)   BMI 24.05 kg/m   Physical Exam  Constitutional:  oriented to person, place, and time. appears well-developed and well-nourished. No distress.  HENT: [place]/AT, PERRLA, no scleral icterus Mouth/Throat: Oropharynx is clear and moist. No oropharyngeal exudate.  Cardiovascular: Normal rate, regular rhythm and normal heart sounds. Exam reveals no gallop and no friction rub.  No murmur  heard.  Pulmonary/Chest: Effort normal and breath sounds normal. No respiratory distress.  has no wheezes.  Neck = supple, no nuchal rigidity Lymphadenopathy:  no cervical adenopathy. No axillary adenopathy Neurological: alert and oriented to person, place, and time.  Skin: Skin is warm and dry. No rash noted. No erythema.  Psychiatric: a normal mood and affect.  behavior is normal.   Lab Results  Component Value Date   CD4TCELL 47 03/05/2017   Lab Results  Component Value Date   CD4TABS 700 03/05/2017   CD4TABS 620 11/09/2016   CD4TABS 320 (L) 03/09/2016   Lab Results  Component Value Date   HIV1RNAQUANT <20 NOT DETECTED 03/05/2017   Lab Results  Component Value Date   HEPBSAB POS (A) 12/09/2015   Lab Results  Component Value Date   LABRPR NON REAC 11/09/2016    CBC Lab Results  Component Value Date   WBC 2.0 (L) 05/20/2017   RBC 3.96 05/20/2017   HGB 11.3 (L) 05/20/2017   HCT 33.4 (L) 05/20/2017   PLT 205 05/20/2017   MCV 84.3 05/20/2017   MCH 28.5 05/20/2017   MCHC 33.8 05/20/2017   RDW 13.6 05/20/2017   LYMPHSABS 1,364 03/05/2017   MONOABS 310 03/05/2017   EOSABS 62 03/05/2017    BMET Lab Results  Component Value Date   NA 139 04/11/2017   K 3.4 (L) 04/11/2017   CL 106 04/11/2017   CO2 27 04/11/2017   GLUCOSE 93 04/11/2017   BUN 9 04/11/2017   CREATININE 0.94 04/11/2017   CALCIUM 9.0 04/11/2017   GFRNONAA >60 04/11/2017   GFRAA >60 04/11/2017      Assessment and Plan  hiv disease = will check labs, continue on current regimen  Health maintenance = will give flu shot today. Will need to arrange for mammo this year  Menorrhagia = resolved

## 2017-07-05 LAB — CBC WITH DIFFERENTIAL/PLATELET
Basophils Absolute: 21 cells/uL (ref 0–200)
Basophils Relative: 0.5 %
Eosinophils Absolute: 119 cells/uL (ref 15–500)
Eosinophils Relative: 2.9 %
HCT: 33.3 % — ABNORMAL LOW (ref 35.0–45.0)
Hemoglobin: 10.7 g/dL — ABNORMAL LOW (ref 11.7–15.5)
Lymphs Abs: 1312 cells/uL (ref 850–3900)
MCH: 27.4 pg (ref 27.0–33.0)
MCHC: 32.1 g/dL (ref 32.0–36.0)
MCV: 85.4 fL (ref 80.0–100.0)
MPV: 11 fL (ref 7.5–12.5)
Monocytes Relative: 10.7 %
Neutro Abs: 2210 cells/uL (ref 1500–7800)
Neutrophils Relative %: 53.9 %
Platelets: 261 10*3/uL (ref 140–400)
RBC: 3.9 10*6/uL (ref 3.80–5.10)
RDW: 12.1 % (ref 11.0–15.0)
Total Lymphocyte: 32 %
WBC mixed population: 439 cells/uL (ref 200–950)
WBC: 4.1 10*3/uL (ref 3.8–10.8)

## 2017-07-05 LAB — RPR: RPR Ser Ql: NONREACTIVE

## 2017-07-05 LAB — URINE CYTOLOGY ANCILLARY ONLY
Chlamydia: NEGATIVE
Neisseria Gonorrhea: NEGATIVE

## 2017-07-05 LAB — T-HELPER CELL (CD4) - (RCID CLINIC ONLY)
CD4 % Helper T Cell: 34 % (ref 33–55)
CD4 T Cell Abs: 520 /uL (ref 400–2700)

## 2017-07-06 LAB — HIV-1 RNA QUANT-NO REFLEX-BLD
HIV 1 RNA Quant: 97 copies/mL — ABNORMAL HIGH
HIV-1 RNA Quant, Log: 1.99 Log copies/mL — ABNORMAL HIGH

## 2017-07-31 ENCOUNTER — Emergency Department (HOSPITAL_COMMUNITY)
Admission: EM | Admit: 2017-07-31 | Discharge: 2017-07-31 | Disposition: A | Discharge status: Home / Self Care | Payer: Medicaid Other | Attending: Emergency Medicine | Admitting: Emergency Medicine

## 2017-07-31 ENCOUNTER — Encounter (HOSPITAL_COMMUNITY): Payer: Self-pay | Admitting: *Deleted

## 2017-07-31 ENCOUNTER — Other Ambulatory Visit: Payer: Self-pay

## 2017-07-31 ENCOUNTER — Emergency Department (HOSPITAL_COMMUNITY): Payer: Medicaid Other

## 2017-07-31 DIAGNOSIS — I10 Essential (primary) hypertension: Secondary | ICD-10-CM | POA: Insufficient documentation

## 2017-07-31 DIAGNOSIS — J069 Acute upper respiratory infection, unspecified: Secondary | ICD-10-CM | POA: Diagnosis not present

## 2017-07-31 DIAGNOSIS — B20 Human immunodeficiency virus [HIV] disease: Secondary | ICD-10-CM | POA: Insufficient documentation

## 2017-07-31 DIAGNOSIS — Z87891 Personal history of nicotine dependence: Secondary | ICD-10-CM | POA: Diagnosis not present

## 2017-07-31 DIAGNOSIS — R05 Cough: Secondary | ICD-10-CM | POA: Diagnosis present

## 2017-07-31 LAB — RAPID STREP SCREEN (MED CTR MEBANE ONLY): Streptococcus, Group A Screen (Direct): NEGATIVE

## 2017-07-31 MED ORDER — SULFAMETHOXAZOLE-TRIMETHOPRIM 800-160 MG PO TABS: 1.0000 | ORAL_TABLET | Freq: Two times a day (BID) | ORAL | 0 refills | Status: DC

## 2017-07-31 MED ORDER — ACETAMINOPHEN 500 MG PO TABS
500.0000 mg | ORAL_TABLET | Freq: Once | ORAL | Status: AC
  Administered 2017-07-31: 500 mg via ORAL
  Filled 2017-07-31: qty 1

## 2017-07-31 MED ORDER — CETIRIZINE HCL 10 MG PO TABS: 10.0000 mg | ORAL_TABLET | Freq: Every day | ORAL | 0 refills | Status: DC

## 2017-07-31 MED ORDER — BENZONATATE 100 MG PO CAPS: 100.0000 mg | ORAL_CAPSULE | Freq: Three times a day (TID) | ORAL | 0 refills | Status: DC

## 2017-07-31 NOTE — ED Provider Notes (Signed)
[place] EMERGENCY DEPARTMENT Provider Note   CSN: [id number] Arrival date & time: 07/31/17  1247     History   Chief Complaint Chief Complaint  Patient presents with  . URI    HPI Rayyan Orsborn is a 46 y.o. female with a history of HIV (last CD4 count in July 2018: 700, VL<20), hypertension who presents the ED today for URI like symptoms times 3 weeks.  Patient states that over the last 3 weeks she has been having an occasional productive cough with clear sputum with associated congestion, sinus pressure, sneezing, itchy/watery eyes, sore throat.  She states her symptoms started the same time that her fiancs started.  No temporality of the cough.  She has not been taking anything for this.  She denies any fever, chills, myalgias, arthralgias, night sweats, weight loss, travel, shortness of breath, chest pain, dyspnea on exertion, hemoptysis, inability to handle secretions, dysphagia, abdominal pain, nausea/vomiting, voice change. No history of asthma, COPD, or CHF.  Patient did receive her flu vaccine this year. Since onset patient also admits to intermittent headaches that have occurred roughly 3 times over the last 3 weeks.  The pain is described as a dull, achy headache on the bilateral temporal regions that lasts for several hours before going away on its own. She has not tried anything for this.  Denies fever, syncope, head trauma, photophobia, phonophobia, UL throbbing pain, N/V, visual loss, stiff neck, neck pain, rash, seizure, jaw claudication, or "thunderclap" onset. Not first HA. Not worst HA of life. Similar to previous headaches in the past. No one in party with similar HA or exposure that would suggest CO poisoning.   HPI  Past Medical History:  Diagnosis Date  . Anemia   . Anxiety   . Fibroid   . Headache(784.0)   . HIV (human immunodeficiency virus infection) ([place])   . Infection    UTI    Patient Active Problem List   Diagnosis Date Noted  .  HIV disease ([place]) 03/05/2017  . HTN (hypertension) 03/05/2017  . Anemia due to chronic blood loss 03/13/2016  . Acquired immunodeficiency syndrome ([place]) 12/07/2015  . Acne vulgaris 12/07/2015  . Episodic mood disorder ([place]) 12/07/2015    Past Surgical History:  Procedure Laterality Date  . DILATION AND CURETTAGE OF UTERUS    . HAND TENDON SURGERY     right thumb  . INDUCED ABORTION    . TUBAL LIGATION      OB History    Gravida Para Term Preterm AB Living   6 4 4   2 4    SAB TAB Ectopic Multiple Live Births   1 1             Home Medications    Prior to Admission medications   Medication Sig Start Date End Date Taking? Authorizing Provider  bisacodyl (DULCOLAX) 5 MG EC tablet Take 1 tablet (5 mg total) by mouth daily as needed for moderate constipation. Patient not taking: Reported on 04/11/2017 03/05/17   Carlyle Basques, MD  darunavir-cobicistat (PREZCOBIX) 800-150 MG tablet Take 1 tablet by mouth daily with breakfast. Swallow whole. Do NOT crush, break or chew tablets. Take with food. 11/09/16   Carlyle Basques, MD  emtricitabine-tenofovir AF (DESCOVY) 200-25 MG tablet Take 1 tablet by mouth daily. 11/09/16   Carlyle Basques, MD  ibuprofen (ADVIL,MOTRIN) 600 MG tablet Take 600 mg by mouth every 6 (six) hours as needed for moderate pain.    [provider]  ondansetron (ZOFRAN ODT) 4 MG disintegrating tablet Take 1 tablet (4 mg total) by mouth every 8 (eight) hours as needed for nausea or vomiting. Patient not taking: Reported on 05/20/2017 04/11/17   Rodell Perna A, PA-C  valACYclovir (VALTREX) 1000 MG tablet TAKE 1 TABLET(1000 MG) BY MOUTH DAILY Patient not taking: Reported on 04/11/2017 03/26/17   Carlyle Basques, MD    Family History Family History  Family history unknown: Yes    Social History Social History   Tobacco Use  . Smoking status: Former Smoker    Types: Cigarettes  . Smokeless tobacco: Never Used  Substance Use Topics  . Alcohol use: Yes     Alcohol/week: 0.0 oz    Comment: hx of alcohol abuse- social drinker now  . Drug use: Yes    Types: Marijuana    Comment: none in 8 yrs     Allergies   Chocolate and Tomato   Review of Systems Review of Systems  All other systems reviewed and are negative.    Physical Exam Updated Vital Signs BP 117/89 (BP Location: Right Arm)   Pulse 89   Temp 98.3 F (36.8 C) (Oral)   Resp 18   SpO2 100%   Physical Exam  Constitutional: She appears well-developed and well-nourished.  HENT:  Head: Normocephalic and atraumatic.  Right Ear: Hearing, tympanic membrane, external ear and ear canal normal.  Left Ear: Hearing, tympanic membrane, external ear and ear canal normal.  Nose: Mucosal edema present. No rhinorrhea. Right sinus exhibits no maxillary sinus tenderness and no frontal sinus tenderness. Left sinus exhibits no maxillary sinus tenderness and no frontal sinus tenderness.  Mouth/Throat: Uvula is midline, oropharynx is clear and moist and mucous membranes are normal. No tonsillar exudate.  The patient has normal phonation and is in control of secretions. No stridor.  Midline uvula without edema. Soft palate rises symmetrically.  No tonsillar erythema or exudates. No PTA. Tongue protrusion is normal. No trismus. No creptius on neck palpation and patient has good dentition. No gingival erythema or fluctuance noted. Mucus membranes moist. No thrush. Temporal TTP.   Eyes: Pupils are equal, round, and reactive to light. Right eye exhibits no discharge. Left eye exhibits no discharge. No scleral icterus.  Neck: Trachea normal. Neck supple. No spinous process tenderness present. No neck rigidity. Normal range of motion present.  No meningismus  Cardiovascular: Normal rate, regular rhythm and intact distal pulses.  No murmur heard. Pulses:      Radial pulses are 2+ on the right side, and 2+ on the left side.       Dorsalis pedis pulses are 2+ on the right side, and 2+ on the left side.        Posterior tibial pulses are 2+ on the right side, and 2+ on the left side.  No lower extremity swelling or edema. Calves symmetric in size bilaterally.  Pulmonary/Chest: Effort normal and breath sounds normal. She exhibits no tenderness.  Abdominal: Soft. Bowel sounds are normal. There is no tenderness. There is no rebound and no guarding.  Musculoskeletal: She exhibits no edema.  Lymphadenopathy:    She has no cervical adenopathy.  Neurological: She is alert.  Mental Status:  Alert, oriented, thought content appropriate, able to give a coherent history. Speech fluent without evidence of aphasia. Able to follow 2 step commands without difficulty.  Cranial Nerves:  II:  Peripheral visual fields grossly normal, pupils equal, round, reactive to light III,IV, VI: ptosis not present, extra-ocular motions intact bilaterally  V,VII: smile symmetric, eyebrows raise symmetric, facial light touch sensation equal VIII: hearing grossly normal to voice  X: uvula elevates symmetrically  XI: bilateral shoulder shrug symmetric and strong XII: midline tongue extension without fassiculations Motor:  Normal tone. 5/5 in upper and lower extremities bilaterally including strong and equal grip strength and dorsiflexion/plantar flexion Sensory: Sensation intact to light touch in all extremities. Negative Romberg.  Deep Tendon Reflexes: 2+ and symmetric in the biceps and patella Cerebellar: normal finger-to-nose with bilateral upper extremities. Normal heel-to -shin balance bilaterally of the lower extremity. No pronator drift.  Gait: normal gait and balance CV: distal pulses palpable throughout   Skin: Skin is warm and dry. No petechiae, no purpura and no rash noted. Rash is not maculopapular, not vesicular and not urticarial. She is not diaphoretic. No erythema.  Psychiatric: She has a normal mood and affect.  Nursing note and vitals reviewed.    ED Treatments / Results  Labs (all labs ordered are  listed, but only abnormal results are displayed) Labs Reviewed  RAPID STREP SCREEN (NOT AT [facility])  CULTURE, GROUP A STREP [facility])    EKG  EKG Interpretation None       Radiology Dg Chest 2 View  Result Date: 07/31/2017 CLINICAL DATA:  Productive cough and shortness of breath for 3-4 weeks. EXAM: CHEST  2 VIEW COMPARISON:  PA and lateral chest 04/11/2017 and 09/06/2016. FINDINGS: The lungs are clear. Heart size is normal. No pneumothorax or pleural effusion. No bony abnormality. IMPRESSION: Negative chest. Electronically Signed   By: Inge Rise M.D.   On: 07/31/2017 15:23    Procedures Procedures (including critical care time)  Medications Ordered in ED Medications  acetaminophen (TYLENOL) tablet 500 mg (500 mg Oral Given 07/31/17 1458)     Initial Impression / Assessment and Plan / ED Course  I have reviewed the triage vital signs and the nursing notes.  Pertinent labs & imaging results that were available during my care of the patient were reviewed by me and considered in my medical decision making (see chart for details).     46 y.o. female with a history of HIV (last CD4 count in July 2018: 700, VL<20) presenting with URI-like symptoms (cough, congestion, sinus pressure, sneezing, itchy/watery eyes, sore throat) over the last 3 weeks.  Patient's vital signs are without fever, tachycardia, tachypnea, hypoxia or hypotension.  She is non-ill and nonseptic appearing.  On exam the patient has mucosal edema b/l. The patients lungs are CTA b/l.  Suspect the patient's symptoms are due to her URI, however will order chest x-ray due to the patient's history of immunocompromise and cough x weeks.  Will also order strep test as the patient is having sore throat. There is no tonsillar erythema, hypertrophy, exudate.  No anterior cervical lymphadenopathy.  No trismus.  Patient is in the control of her secretions.  Do not suspect PTA.  Patient is without sinus tenderness to palpation.  Do  not feel the patient needs CT scan to evaluate for sinusitis.  Chest x-ray negative. Strep test negative. Will treat the patient with Bactrim (coverage of PCP PNA) due to history of immunocompromise and cough x 3 weeks. Patient has appointment with infectious disease doctor tomorrow, where she is to discuss staying on medication vs stopping. Patient given strict return precautions.   Patient also with HA. Pt HA treated and improved while in ED.  Presentation is like pts typical HA and non concerning for [facility], [facility]. Symptoms not consistent with CO  exposure. Patient without fever, neck stiffness, rash or there symptoms that make me concerned for meningitis. Patient does have temporal ttp. This is b/l. No jaw claudication and no visual changes. Doubt b/l temporal arteritis. Pt is afebrile with no focal neuro deficits, nuchal rigidity, or change in vision. Pt is to follow up with PCP/infectious disease docotor to discuss prophylactic medication. Pt verbalizes understanding and is agreeable with plan to dc.Return precautions discussed. Patient appears safe for discharge. Patient case discussed with Dr. Francia Greaves who is in agreement with plan.  Final Clinical Impressions(s) / ED Diagnoses   Final diagnoses:  Upper respiratory tract infection, unspecified type    ED Discharge Orders        Ordered    sulfamethoxazole-trimethoprim (BACTRIM DS,SEPTRA DS) 800-160 MG tablet  2 times daily     07/31/17 1557    benzonatate (TESSALON) 100 MG capsule  Every 8 hours     07/31/17 1557    cetirizine (ZYRTEC) 10 MG tablet  Daily     07/31/17 1557       Lorelle Gibbs 07/31/17 Lorra Hals, MD 08/06/17 [phone]

## 2017-07-31 NOTE — Discharge Instructions (Signed)
Please read and follow all provided instructions.  Your diagnoses today include:  1. Upper respiratory tract infection, unspecified type     Tests performed today include: Vital signs. See below for your results today.  Chest xray - reassuring Strep test - negative.   Medications prescribed/advised:  1. Musinex [Guaifenesin] as a decongestant [thin mucus - you have to be well hydrated when taking this for it to work] - take over the counter  2. Tylenol for fever/pain and Motrin/Ibuprofen for muscle aches 3. Allegra or Zyrtec: This medication is an allergy medication that may aid in helping to relieve your symptoms. Please take daily and discuss with your PCP if you should remain on this medication at follow up. If you were prescribed a allergy medication (allegra) please take daily.  4. Cough Suppressant: tessalon. 5. Bactrim: Please take all of your antibiotics until finished!   You may develop abdominal discomfort or diarrhea from the antibiotic.  You may help offset this with probiotics which you can buy or get in yogurt. Do not eat or take the probiotics until 2 hours after your antibiotic. Do not take your medicine if develop an itchy rash, swelling in your mouth or lips, or difficulty breathing.    Follow-up instructions: Followup with your doctor this week.  Your more than welcome to return to the emergency department if symptoms worsen or become concerning.  Return instructions:  Please return to the Emergency Department if you do not get better, if you get worse, or new symptoms OR  - Fever (temperature greater than 101.73F)  - Bleeding that does not stop with holding pressure to the area    -Severe pain (please note that you may be more sore the day after your accident)  - Chest Pain  - Difficulty breathing (worsening shortness of breath with sputum production may  be a sign of pneumonia.   - Severe nausea or vomiting  - Inability to tolerate food and liquids  - Passing out  -  Skin becoming red around your wounds  - Change in mental status (confusion or lethargy)  - New numbness or weakness     -You develop fever, swollen neck glands, pain with swallowing or white areas on  the back of your throat. This may be a sign of strep throat.  Please return if you have any other emergent concerns.  Additional Information:  Your vital signs today were: BP 117/89 (BP Location: Right Arm)    Pulse 89    Temp 98.3 F (36.8 C) (Oral)    Resp 18    SpO2 100%  If your blood pressure (BP) was elevated above 135/85 this visit, please have this repeated by your doctor within one month.

## 2017-07-31 NOTE — ED Notes (Signed)
Patient transported to X-ray 

## 2017-07-31 NOTE — ED Triage Notes (Signed)
Pt reports not feeling well x 3 weeks. Has congestion, cough, headache and sore throat. Denies n/v/d. Pt states it feels like a migraine. Mask on pt at triage.

## 2017-08-03 LAB — CULTURE, GROUP A STREP (THRC)

## 2017-08-16 ENCOUNTER — Other Ambulatory Visit: Payer: Self-pay | Admitting: Obstetrics and Gynecology

## 2017-09-04 ENCOUNTER — Other Ambulatory Visit: Payer: Self-pay

## 2017-09-04 ENCOUNTER — Emergency Department (HOSPITAL_COMMUNITY)
Admission: EM | Admit: 2017-09-04 | Discharge: 2017-09-04 | Disposition: A | Discharge status: Home / Self Care | Payer: Medicaid Other | Attending: Emergency Medicine | Admitting: Emergency Medicine

## 2017-09-04 ENCOUNTER — Encounter (HOSPITAL_COMMUNITY): Payer: Self-pay | Admitting: Emergency Medicine

## 2017-09-04 DIAGNOSIS — Z87891 Personal history of nicotine dependence: Secondary | ICD-10-CM | POA: Diagnosis not present

## 2017-09-04 DIAGNOSIS — I10 Essential (primary) hypertension: Secondary | ICD-10-CM | POA: Diagnosis not present

## 2017-09-04 DIAGNOSIS — Z21 Asymptomatic human immunodeficiency virus [HIV] infection status: Secondary | ICD-10-CM | POA: Insufficient documentation

## 2017-09-04 DIAGNOSIS — K0889 Other specified disorders of teeth and supporting structures: Secondary | ICD-10-CM | POA: Diagnosis present

## 2017-09-04 DIAGNOSIS — K047 Periapical abscess without sinus: Secondary | ICD-10-CM | POA: Insufficient documentation

## 2017-09-04 MED ORDER — HYDROCODONE-ACETAMINOPHEN 5-325 MG PO TABS: ORAL_TABLET | ORAL | 0 refills | Status: DC

## 2017-09-04 MED ORDER — AMOXICILLIN 500 MG PO CAPS: 1000.0000 mg | ORAL_CAPSULE | Freq: Two times a day (BID) | ORAL | 0 refills | Status: DC

## 2017-09-04 MED ORDER — KETOROLAC TROMETHAMINE 60 MG/2ML IM SOLN
30.0000 mg | Freq: Once | INTRAMUSCULAR | Status: AC
  Administered 2017-09-04: 30 mg via INTRAMUSCULAR
  Filled 2017-09-04: qty 2

## 2017-09-04 MED ORDER — AMOXICILLIN 500 MG PO CAPS
1000.0000 mg | ORAL_CAPSULE | Freq: Once | ORAL | Status: AC
  Administered 2017-09-04: 1000 mg via ORAL
  Filled 2017-09-04: qty 2

## 2017-09-04 NOTE — ED Provider Notes (Signed)
[facility] [name] Provider Note   CSN: [id number] Arrival date & time: 09/04/17  0051     History   Chief Complaint Chief Complaint  Patient presents with  . Dental Pain   HPI   Blood pressure (!) 145/91, pulse 85, temperature 99.5 F (37.5 C), temperature source Oral, resp. rate 18, height 5\' 6"  (1.676 m), weight 63.5 kg (140 lb), last menstrual period 08/28/2017, SpO2 100 %.  Dawn [name] is a 47 y.o. female with past medical history significant for HIV (last CD4 count within the last 6 months was over 500, she does have a detectable viral load) complaining of left-sided facial swelling and dental pain worsening over the course of last several days.  He can multiple over-the-counter pain medications with little relief.  She denies any fevers, chills, change in vision swallowing.   Past Medical History:  Diagnosis Date  . Anemia   . Anxiety   . Fibroid   . Headache(784.0)   . HIV (human immunodeficiency virus infection) ([place])   . Infection    UTI    Patient Active Problem List   Diagnosis Date Noted  . HIV disease ([place]) 03/05/2017  . HTN (hypertension) 03/05/2017  . Anemia due to chronic blood loss 03/13/2016  . Acquired immunodeficiency syndrome ([place]) 12/07/2015  . Acne vulgaris 12/07/2015  . Episodic mood disorder ([place]) 12/07/2015    Past Surgical History:  Procedure Laterality Date  . DILATION AND CURETTAGE OF UTERUS    . HAND TENDON SURGERY     right thumb  . INDUCED ABORTION    . TUBAL LIGATION      OB History    Gravida Para Term Preterm AB Living   6 4 4   2 4    SAB TAB Ectopic Multiple Live Births   1 1             Home Medications    Prior to Admission medications   Medication Sig Start Date End Date Taking? Authorizing Provider  Acetaminophen (TYLENOL PO) Take 1 tablet by mouth 4 (four) times daily as needed (TOOTH PAIN).   Yes [provider]  darunavir-cobicistat (PREZCOBIX) 800-150 MG tablet Take 1  tablet by mouth daily with breakfast. Swallow whole. Do NOT crush, break or chew tablets. Take with food. 11/09/16  Yes Carlyle Basques, MD  emtricitabine-tenofovir AF (DESCOVY) 200-25 MG tablet Take 1 tablet by mouth daily. 11/09/16  Yes Carlyle Basques, MD  ibuprofen (ADVIL,MOTRIN) 800 MG tablet Take 600 mg by mouth every 6 (six) hours as needed for moderate pain.   Yes [provider]  megestrol (MEGACE) 40 MG tablet Take 1 tablet (40 mg total) by mouth daily. 08/21/17 09/20/17 Yes Jonnie Kind, MD  traMADol (ULTRAM) 50 MG tablet Take 50 mg by mouth every 6 (six) hours as needed.   Yes [provider]  amoxicillin (AMOXIL) 500 MG capsule Take 2 capsules (1,000 mg total) by mouth 2 (two) times daily. 09/04/17   Rael Yo, Elmyra Ricks, PA-C  HYDROcodone-acetaminophen (NORCO/VICODIN) 5-325 MG tablet Take 1-2 tablets by mouth every 6 hours as needed for pain. 09/04/17   Maury Bamba, Charna Elizabeth    Family History Family History  Family history unknown: Yes    Social History Social History   Tobacco Use  . Smoking status: Former Smoker    Types: Cigarettes  . Smokeless tobacco: Never Used  Substance Use Topics  . Alcohol use: Yes    Alcohol/week: 0.0 oz    Comment: hx of  alcohol abuse- social drinker now  . Drug use: Yes    Types: Marijuana    Comment: none in 8 yrs     Allergies   Chocolate and Tomato   Review of Systems Review of Systems  A complete review of systems was obtained and all systems are negative except as noted in the HPI and PMH.   Physical Exam Updated Vital Signs BP (!) 145/91 (BP Location: Left Arm)   Pulse 85   Temp 99.5 F (37.5 C) (Oral)   Resp 18   Ht 5\' 6"  (1.676 m)   Wt 63.5 kg (140 lb)   LMP 08/28/2017   SpO2 100%   BMI 22.60 kg/m   Physical Exam  Constitutional: She is oriented to person, place, and time. She appears well-developed and well-nourished. No distress.  HENT:  Head: Normocephalic and atraumatic.  Mouth/Throat:  Oropharynx is clear and moist.    Generally good dentition without deep dental caries. Patient has fluctuant abscess as diagrammed, swelling visible from on the face but with with no facial cellulitis. Patient is handling their secretions. There is no tenderness to palpation or firmness underneath tongue bilaterally. No trismus.      Eyes: Conjunctivae and EOM are normal. Pupils are equal, round, and reactive to light.  Neck: Normal range of motion.  Cardiovascular: Normal rate, regular rhythm and intact distal pulses.  Pulmonary/Chest: Effort normal and breath sounds normal.  Abdominal: Soft. There is no tenderness.  Musculoskeletal: Normal range of motion.  Neurological: She is alert and oriented to person, place, and time.  Skin: She is not diaphoretic.  Psychiatric: She has a normal mood and affect.  Nursing note and vitals reviewed.    ED Treatments / Results  Labs (all labs ordered are listed, but only abnormal results are displayed) Labs Reviewed - No data to display  EKG  EKG Interpretation None       Radiology No results found.  Procedures Procedures (including critical care time)  Medications Ordered in ED Medications  amoxicillin (AMOXIL) capsule 1,000 mg (not administered)  ketorolac (TORADOL) injection 30 mg (not administered)     Initial Impression / Assessment and Plan / ED Course  I have reviewed the triage vital signs and the nursing notes.  Pertinent labs & imaging results that were available during my care of the patient were reviewed by me and considered in my medical decision making (see chart for details).     Vitals:   09/04/17 0138  BP: (!) 145/91  Pulse: 85  Resp: 18  Temp: 99.5 F (37.5 C)  TempSrc: Oral  SpO2: 100%  Weight: 63.5 kg (140 lb)  Height: 5\' 6"  (1.676 m)    Medications  amoxicillin (AMOXIL) capsule 1,000 mg (not administered)  ketorolac (TORADOL) injection 30 mg (not administered)    Dawn [name] is 47 y.o.  female presenting with dental pain and facial swelling consistent with dentoalveolar abscess, this is not pointing.  I do not think this patient would benefit from a incision and drainage at this time.  No signs of systemic infection or deep space infection.  Patient does have HIV but is essentially immunocompetent with a CD4 count over 500.  Patient will be started on amoxicillin, given Vicodin for pain control at home and advised close follow-up with dentist.  Evaluation does not show pathology that would require ongoing emergent intervention or inpatient treatment. Pt is hemodynamically stable and mentating appropriately. Discussed findings and plan with patient/guardian, who agrees with care plan.  All questions answered. Return precautions discussed and outpatient follow up given.    Final Clinical Impressions(s) / ED Diagnoses   Final diagnoses:  Dentoalveolar abscess    ED Discharge Orders        Ordered    HYDROcodone-acetaminophen (NORCO/VICODIN) 5-325 MG tablet     09/04/17 0947    amoxicillin (AMOXIL) 500 MG capsule  2 times daily     09/04/17 0947       Darnel Mchan, Charna Elizabeth 09/04/17 1035    Dorie Rank, MD 09/04/17 [phone]

## 2017-09-04 NOTE — ED Triage Notes (Signed)
Pt reports having dental pain and swelling to right upper portion of mouth since Friday. Pt to have tooth extracted on 24 but pain is to bad. Swelling also noted to face. No acute distress at this time.

## 2017-09-04 NOTE — Discharge Instructions (Signed)
Take vicodin for breakthrough pain, do not drink alcohol, drive, care for children or do other critical tasks while taking vicodin. °  °Apply warm compresses to jaw throughout the day.  ° °Take your antibiotics as directed and to the end of the course.  ° °Followup with a dentist is very important for dental pain. Return to emergency department for emergent changing or worsening symptoms including  fever, change in vision, redness to the face that rapidly spreads towards the eye, nausea or vomiting, difficulty swallowing or shortness of breath. ° °

## 2017-09-25 ENCOUNTER — Other Ambulatory Visit: Payer: Self-pay | Admitting: Internal Medicine

## 2017-09-25 ENCOUNTER — Other Ambulatory Visit: Payer: Self-pay | Admitting: Obstetrics and Gynecology

## 2017-09-25 DIAGNOSIS — B009 Herpesviral infection, unspecified: Secondary | ICD-10-CM

## 2017-09-26 NOTE — Telephone Encounter (Signed)
Pt with menorrhagia, seen only in ED's for gyn concerns. Followed in ID clinic for + HIV.  Pt would need a gyn appt in [place] to continue Megace , or to address bleeding management long term.

## 2017-10-09 ENCOUNTER — Ambulatory Visit: Payer: Medicaid Other | Admitting: Internal Medicine

## 2017-10-18 ENCOUNTER — Other Ambulatory Visit: Payer: Self-pay | Admitting: Obstetrics and Gynecology

## 2017-11-27 ENCOUNTER — Other Ambulatory Visit: Payer: Self-pay | Admitting: Internal Medicine

## 2017-11-27 DIAGNOSIS — B20 Human immunodeficiency virus [HIV] disease: Secondary | ICD-10-CM

## 2017-11-29 ENCOUNTER — Other Ambulatory Visit: Payer: Self-pay | Admitting: Behavioral Health

## 2017-11-29 DIAGNOSIS — B009 Herpesviral infection, unspecified: Secondary | ICD-10-CM

## 2017-11-29 MED ORDER — VALACYCLOVIR HCL 1 G PO TABS: ORAL_TABLET | ORAL | 1 refills | Status: DC

## 2017-12-24 ENCOUNTER — Other Ambulatory Visit: Payer: Self-pay | Admitting: Internal Medicine

## 2017-12-24 ENCOUNTER — Other Ambulatory Visit: Payer: Self-pay | Admitting: Obstetrics and Gynecology

## 2017-12-24 DIAGNOSIS — B20 Human immunodeficiency virus [HIV] disease: Secondary | ICD-10-CM

## 2017-12-24 DIAGNOSIS — B009 Herpesviral infection, unspecified: Secondary | ICD-10-CM

## 2017-12-24 NOTE — Telephone Encounter (Signed)
Received a request for continued Rx for Megace for menstrual control. Pt was seen x 1 in Sept 2018 in [facility] for bleeding and vulvar abscesses. Pt has not established GYN care. Pt has been called and advised that she needs to be seen for ongoing GYN care, and I recommended the GYN clinic at [facility] as an option, to discuss whether to continue Megace, consider IUD, or consider Endometrial Ablation. Pt given number to [facility]. I will not renew Megace , as pt needs ongoing Gyn provider.

## 2018-01-01 ENCOUNTER — Telehealth: Payer: Self-pay | Admitting: *Deleted

## 2018-01-01 ENCOUNTER — Other Ambulatory Visit: Payer: Medicaid Other

## 2018-01-01 NOTE — Telephone Encounter (Signed)
Patient walked in, states she wants to know about a different medication. She reports a lot of side effects with her current regimen of prezcobix/decovy - fatigue, weakness, nausea, hypersalivation, occassional emesis.  She states she has seen commercials for 1 tablet daily, is wondering if that would help, or if she can have something to prevent nausea. Patient missed her [place] appointments, will update her contact information and reschedule with Dr Dulany Flattery. If sending any meds, she uses [facility] on [address], has medicaid. Landis Gandy, RN

## 2018-01-10 ENCOUNTER — Ambulatory Visit: Payer: Medicaid Other | Admitting: Internal Medicine

## 2018-01-15 ENCOUNTER — Other Ambulatory Visit: Payer: Medicaid Other

## 2018-01-15 ENCOUNTER — Other Ambulatory Visit: Payer: Self-pay

## 2018-01-15 DIAGNOSIS — B20 Human immunodeficiency virus [HIV] disease: Secondary | ICD-10-CM

## 2018-01-16 LAB — COMPLETE METABOLIC PANEL WITH GFR
AG Ratio: 1.2 (calc) (ref 1.0–2.5)
ALT: 10 U/L (ref 6–29)
AST: 17 U/L (ref 10–30)
Albumin: 4.2 g/dL (ref 3.6–5.1)
Alkaline phosphatase (APISO): 42 U/L (ref 33–115)
BUN: 11 mg/dL (ref 7–25)
CO2: 25 mmol/L (ref 20–32)
Calcium: 9.2 mg/dL (ref 8.6–10.2)
Chloride: 107 mmol/L (ref 98–110)
Creat: 0.91 mg/dL (ref 0.50–1.10)
GFR, Est African American: 89 mL/min/{1.73_m2} (ref >=60)
GFR, Est Non African American: 77 mL/min/{1.73_m2} (ref >=60)
Globulin: 3.4 g/dL (calc) (ref 1.9–3.7)
Glucose, Bld: 71 mg/dL (ref 65–99)
Potassium: 3.7 mmol/L (ref 3.5–5.3)
Sodium: 140 mmol/L (ref 135–146)
Total Bilirubin: 0.6 mg/dL (ref 0.2–1.2)
Total Protein: 7.6 g/dL (ref 6.1–8.1)

## 2018-01-16 LAB — CBC WITH DIFFERENTIAL/PLATELET
Basophils Absolute: 9 cells/uL (ref 0–200)
Basophils Relative: 0.4 %
Eosinophils Absolute: 20 cells/uL (ref 15–500)
Eosinophils Relative: 0.9 %
HCT: 30.4 % — ABNORMAL LOW (ref 35.0–45.0)
Hemoglobin: 9.5 g/dL — ABNORMAL LOW (ref 11.7–15.5)
Lymphs Abs: 953 cells/uL (ref 850–3900)
MCH: 24.7 pg — ABNORMAL LOW (ref 27.0–33.0)
MCHC: 31.3 g/dL — ABNORMAL LOW (ref 32.0–36.0)
MCV: 79.2 fL — ABNORMAL LOW (ref 80.0–100.0)
MPV: 11.3 fL (ref 7.5–12.5)
Monocytes Relative: 18.3 %
Neutro Abs: 816 cells/uL — ABNORMAL LOW (ref 1500–7800)
Neutrophils Relative %: 37.1 %
Platelets: 265 10*3/uL (ref 140–400)
RBC: 3.84 10*6/uL (ref 3.80–5.10)
RDW: 14.7 % (ref 11.0–15.0)
Total Lymphocyte: 43.3 %
WBC mixed population: 403 cells/uL (ref 200–950)
WBC: 2.2 10*3/uL — ABNORMAL LOW (ref 3.8–10.8)

## 2018-01-16 LAB — T-HELPER CELL (CD4) - (RCID CLINIC ONLY)
CD4 % Helper T Cell: 36 % (ref 33–55)
CD4 T Cell Abs: 360 /uL — ABNORMAL LOW (ref 400–2700)

## 2018-01-18 LAB — HIV-1 RNA QUANT-NO REFLEX-BLD
HIV 1 RNA Quant: 989 copies/mL — ABNORMAL HIGH
HIV-1 RNA Quant, Log: 3 Log copies/mL — ABNORMAL HIGH

## 2018-01-24 ENCOUNTER — Ambulatory Visit: Payer: Medicaid Other | Admitting: Internal Medicine

## 2018-01-24 ENCOUNTER — Other Ambulatory Visit: Payer: Self-pay | Admitting: Obstetrics and Gynecology

## 2018-01-29 ENCOUNTER — Ambulatory Visit: Payer: Medicaid Other | Admitting: Internal Medicine

## 2018-01-29 ENCOUNTER — Ambulatory Visit: Payer: Medicaid Other | Admitting: Medical

## 2018-03-19 ENCOUNTER — Other Ambulatory Visit: Payer: Self-pay | Admitting: Internal Medicine

## 2018-03-19 DIAGNOSIS — B20 Human immunodeficiency virus [HIV] disease: Secondary | ICD-10-CM

## 2018-03-20 ENCOUNTER — Telehealth: Payer: Self-pay | Admitting: *Deleted

## 2018-03-20 DIAGNOSIS — B20 Human immunodeficiency virus [HIV] disease: Secondary | ICD-10-CM

## 2018-03-20 NOTE — Telephone Encounter (Signed)
Refill requested for prezcobix, descovy. Patient is detectable, no-showed multiple visits. RN gave 30 day supply, requesting an appointment for future refills. Sent referral to [facility], as patient was previously undetectable. Landis Gandy, RN

## 2018-04-01 NOTE — Telephone Encounter (Signed)
RN spoke with pharmacies. [facility] has not supplied medication since 07/2017.  [facility] delivered refill last week to [address], supplied another phone number to contact patient - [phone]. RN attempted to call patient at this number, but call could not be completed at that time. Per pharmacy, their driver did not relay message on prescription to have patient call clinic for an appointment. RN asked if they could please relay that message to the patient. Landis Gandy, RN

## 2018-04-10 ENCOUNTER — Ambulatory Visit: Payer: Medicaid Other | Admitting: Medical

## 2018-04-11 ENCOUNTER — Other Ambulatory Visit: Payer: Self-pay | Admitting: *Deleted

## 2018-04-11 ENCOUNTER — Telehealth: Payer: Self-pay | Admitting: *Deleted

## 2018-04-11 NOTE — Telephone Encounter (Signed)
Referral received and I would love to connect with the patient to get her back in care. Several calls made to all numbers listed for the patient (cell number, emergency contact and shelter) All numbers provided are not in service.  I will attempt a drive by to the [address] address (this is where [facility] delivered the medications to)  and see if the location appears safe for a unscheduled visit.

## 2018-04-18 ENCOUNTER — Other Ambulatory Visit: Payer: Self-pay | Admitting: Internal Medicine

## 2018-04-18 DIAGNOSIS — B20 Human immunodeficiency virus [HIV] disease: Secondary | ICD-10-CM

## 2018-04-26 ENCOUNTER — Other Ambulatory Visit: Payer: Self-pay | Admitting: Internal Medicine

## 2018-04-26 DIAGNOSIS — B20 Human immunodeficiency virus [HIV] disease: Secondary | ICD-10-CM

## 2018-04-30 ENCOUNTER — Other Ambulatory Visit: Payer: Self-pay | Admitting: Internal Medicine

## 2018-04-30 ENCOUNTER — Telehealth: Payer: Self-pay

## 2018-04-30 DIAGNOSIS — B20 Human immunodeficiency virus [HIV] disease: Secondary | ICD-10-CM

## 2018-04-30 NOTE — Telephone Encounter (Signed)
Attempted to call patient to follow-up with Dr. Hargett Flattery at our office. Patient has missed multiple appointments in office since 06/2017. Reached patient at [phone]. Patient states she is not taking her medication since it makes her feel sick. Attempted to schedule an appointment with Dr. Knutzen Flattery patient refused. Stated she does not want to come back to our office and ended call. Will inform Dr. Trulock Flattery of call.  [place]

## 2018-05-06 ENCOUNTER — Ambulatory Visit (INDEPENDENT_AMBULATORY_CARE_PROVIDER_SITE_OTHER): Payer: Medicaid Other | Admitting: Internal Medicine

## 2018-05-06 DIAGNOSIS — N921 Excessive and frequent menstruation with irregular cycle: Secondary | ICD-10-CM

## 2018-05-06 DIAGNOSIS — R110 Nausea: Secondary | ICD-10-CM | POA: Diagnosis not present

## 2018-05-06 DIAGNOSIS — B20 Human immunodeficiency virus [HIV] disease: Secondary | ICD-10-CM

## 2018-05-06 DIAGNOSIS — Z23 Encounter for immunization: Secondary | ICD-10-CM | POA: Diagnosis not present

## 2018-05-06 MED ORDER — ONDANSETRON HCL 8 MG PO TABS: 8.0000 mg | ORAL_TABLET | Freq: Three times a day (TID) | ORAL | 3 refills | Status: DC | PRN

## 2018-05-06 NOTE — Progress Notes (Signed)
RFV: follow up for hiv disease  Patient ID: Dawn [name], female   DOB: 12/02/1970, 47 y.o.   MRN: [id number]  HPI Dawn [name] is a 47yo F with poorly controlled hiv disease, noncompliance. She has intermitent adherence due nausea Also has menorrhagia with menses lasting 2-3 wk. She reports that she has not taken her meds of late. Not vomiting due to nausea.  Outpatient Encounter Medications as of 05/06/2018  Medication Sig  . Acetaminophen (TYLENOL PO) Take 1 tablet by mouth 4 (four) times daily as needed (TOOTH PAIN).  Marland Kitchen amoxicillin (AMOXIL) 500 MG capsule Take 2 capsules (1,000 mg total) by mouth 2 (two) times daily.  . DESCOVY 200-25 MG tablet TAKE ONE TABLET BY MOUTH DAILY  . HYDROcodone-acetaminophen (NORCO/VICODIN) 5-325 MG tablet Take 1-2 tablets by mouth every 6 hours as needed for pain.  Marland Kitchen ibuprofen (ADVIL,MOTRIN) 800 MG tablet Take 600 mg by mouth every 6 (six) hours as needed for moderate pain.  Marland Kitchen PREZCOBIX 800-150 MG tablet TAKE ONE TABLET BY MOUTH DAILY WITH BREAKFAST. SWALLOW WHOLE. DO NOT CRUSH, BREAK, OR CHEW TABLETS.  . traMADol (ULTRAM) 50 MG tablet Take 50 mg by mouth every 6 (six) hours as needed.  . valACYclovir (VALTREX) 1000 MG tablet TAKE 1 TABLET(1000 MG) BY MOUTH DAILY  . valACYclovir (VALTREX) 1000 MG tablet TAKE 1 TABLET(1000 MG) BY MOUTH DAILY   No facility-administered encounter medications on file as of 05/06/2018.      Patient Active Problem List   Diagnosis Date Noted  . HIV disease ([place]) 03/05/2017  . HTN (hypertension) 03/05/2017  . Anemia due to chronic blood loss 03/13/2016  . Acquired immunodeficiency syndrome ([place]) 12/07/2015  . Acne vulgaris 12/07/2015  . Episodic mood disorder ([place]) 12/07/2015     Health Maintenance Due  Topic Date Due  . TETANUS/TDAP  04/13/1992  . INFLUENZA VACCINE  03/28/2018     Review of Systems 12 point ros is negative except what is mentioned above Physical Exam  There were no vitals taken for this  visit. Physical Exam  Constitutional:  oriented to person, place, and time. appears well-developed and well-nourished. No distress.  HENT: [place]/AT, PERRLA, no scleral icterus Mouth/Throat: Oropharynx is clear and moist. No oropharyngeal exudate.  Cardiovascular: Normal rate, regular rhythm and normal heart sounds. Exam reveals no gallop and no friction rub.  No murmur heard.  Pulmonary/Chest: Effort normal and breath sounds normal. No respiratory distress.  has no wheezes.  Neck = supple, no nuchal rigidity Abdominal: Soft. Bowel sounds are normal.  exhibits no distension. There is no tenderness.  Lymphadenopathy: no cervical adenopathy. No axillary adenopathy Neurological: alert and oriented to person, place, and time.  Skin: Skin is warm and dry. No rash noted. No erythema.  Psychiatric: a normal mood and affect.  behavior is normal.    Lab Results  Component Value Date   CD4TCELL 36 01/15/2018   Lab Results  Component Value Date   CD4TABS 360 (L) 01/15/2018   CD4TABS 520 07/04/2017   CD4TABS 700 03/05/2017   Lab Results  Component Value Date   HIV1RNAQUANT 989 (H) 01/15/2018   Lab Results  Component Value Date   HEPBSAB POS (A) 12/09/2015   Lab Results  Component Value Date   LABRPR NON-REACTIVE 07/04/2017    CBC Lab Results  Component Value Date   WBC 2.2 (L) 01/15/2018   RBC 3.84 01/15/2018   HGB 9.5 (L) 01/15/2018   HCT 30.4 (L) 01/15/2018   PLT 265 01/15/2018   MCV 79.2 (  L) 01/15/2018   MCH 24.7 (L) 01/15/2018   MCHC 31.3 (L) 01/15/2018   RDW 14.7 01/15/2018   LYMPHSABS 953 01/15/2018   MONOABS 310 03/05/2017   EOSABS 20 01/15/2018    BMET Lab Results  Component Value Date   NA 140 01/15/2018   K 3.7 01/15/2018   CL 107 01/15/2018   CO2 25 01/15/2018   GLUCOSE 71 01/15/2018   BUN 11 01/15/2018   CREATININE 0.91 01/15/2018   CALCIUM 9.2 01/15/2018   GFRNONAA 77 01/15/2018   GFRAA 89 01/15/2018      Assessment and Plan  hiv disease= will  get V: genotype  Menorrhagia = get cbc. Send women's  Health maintenance Flu vaccine  Nausea without vomiting = will give zofran to take with meals daily

## 2018-05-07 DIAGNOSIS — N921 Excessive and frequent menstruation with irregular cycle: Secondary | ICD-10-CM | POA: Diagnosis not present

## 2018-05-07 DIAGNOSIS — Z23 Encounter for immunization: Secondary | ICD-10-CM

## 2018-05-07 LAB — T-HELPER CELL (CD4) - (RCID CLINIC ONLY)
CD4 % Helper T Cell: 30 % — ABNORMAL LOW (ref 33–55)
CD4 T Cell Abs: 390 /uL — ABNORMAL LOW (ref 400–2700)

## 2018-05-11 LAB — CBC WITH DIFFERENTIAL/PLATELET
Basophils Absolute: 19 cells/uL (ref 0–200)
Basophils Relative: 0.7 %
Eosinophils Absolute: 30 cells/uL (ref 15–500)
Eosinophils Relative: 1.1 %
HCT: 25.4 % — ABNORMAL LOW (ref 35.0–45.0)
Hemoglobin: 7.4 g/dL — ABNORMAL LOW (ref 11.7–15.5)
Lymphs Abs: 1296 cells/uL (ref 850–3900)
MCH: 21.4 pg — ABNORMAL LOW (ref 27.0–33.0)
MCHC: 29.1 g/dL — ABNORMAL LOW (ref 32.0–36.0)
MCV: 73.4 fL — ABNORMAL LOW (ref 80.0–100.0)
MPV: 10.6 fL (ref 7.5–12.5)
Monocytes Relative: 9.6 %
Neutro Abs: 1096 cells/uL — ABNORMAL LOW (ref 1500–7800)
Neutrophils Relative %: 40.6 %
Platelets: 295 10*3/uL (ref 140–400)
RBC: 3.46 10*6/uL — ABNORMAL LOW (ref 3.80–5.10)
RDW: 15.2 % — ABNORMAL HIGH (ref 11.0–15.0)
Total Lymphocyte: 48 %
WBC mixed population: 259 cells/uL (ref 200–950)
WBC: 2.7 10*3/uL — ABNORMAL LOW (ref 3.8–10.8)

## 2018-05-11 LAB — HIV-1 RNA ULTRAQUANT REFLEX TO GENTYP+
HIV 1 RNA Quant: 356 copies/mL — ABNORMAL HIGH
HIV-1 RNA Quant, Log: 2.55 Log copies/mL — ABNORMAL HIGH

## 2018-05-11 LAB — RPR: RPR Ser Ql: NONREACTIVE

## 2018-05-25 ENCOUNTER — Emergency Department (HOSPITAL_COMMUNITY)
Admission: EM | Admit: 2018-05-25 | Discharge: 2018-05-25 | Disposition: A | Discharge status: Home / Self Care | Payer: Medicaid Other | Attending: Emergency Medicine | Admitting: Emergency Medicine

## 2018-05-25 ENCOUNTER — Other Ambulatory Visit: Payer: Self-pay

## 2018-05-25 ENCOUNTER — Encounter (HOSPITAL_COMMUNITY): Payer: Self-pay | Admitting: Emergency Medicine

## 2018-05-25 DIAGNOSIS — Z79899 Other long term (current) drug therapy: Secondary | ICD-10-CM | POA: Insufficient documentation

## 2018-05-25 DIAGNOSIS — Z87891 Personal history of nicotine dependence: Secondary | ICD-10-CM | POA: Diagnosis not present

## 2018-05-25 DIAGNOSIS — B20 Human immunodeficiency virus [HIV] disease: Secondary | ICD-10-CM | POA: Insufficient documentation

## 2018-05-25 DIAGNOSIS — S41112A Laceration without foreign body of left upper arm, initial encounter: Secondary | ICD-10-CM

## 2018-05-25 DIAGNOSIS — Y999 Unspecified external cause status: Secondary | ICD-10-CM | POA: Insufficient documentation

## 2018-05-25 DIAGNOSIS — Y9389 Activity, other specified: Secondary | ICD-10-CM | POA: Insufficient documentation

## 2018-05-25 DIAGNOSIS — Z23 Encounter for immunization: Secondary | ICD-10-CM | POA: Insufficient documentation

## 2018-05-25 DIAGNOSIS — S51812A Laceration without foreign body of left forearm, initial encounter: Secondary | ICD-10-CM | POA: Diagnosis not present

## 2018-05-25 DIAGNOSIS — I10 Essential (primary) hypertension: Secondary | ICD-10-CM | POA: Insufficient documentation

## 2018-05-25 DIAGNOSIS — Y9289 Other specified places as the place of occurrence of the external cause: Secondary | ICD-10-CM | POA: Diagnosis not present

## 2018-05-25 MED ORDER — LIDOCAINE-EPINEPHRINE (PF) 1 %-1:200000 IJ SOLN
30.0000 mL | Freq: Once | INTRAMUSCULAR | Status: AC
  Administered 2018-05-25: 30 mL via INTRADERMAL
  Filled 2018-05-25 (×3): qty 30

## 2018-05-25 MED ORDER — TETANUS-DIPHTH-ACELL PERTUSSIS 5-2.5-18.5 LF-MCG/0.5 IM SUSP
0.5000 mL | Freq: Once | INTRAMUSCULAR | Status: AC
  Administered 2018-05-25: 0.5 mL via INTRAMUSCULAR
  Filled 2018-05-25: qty 0.5

## 2018-05-25 MED ORDER — FENTANYL CITRATE (PF) 100 MCG/2ML IJ SOLN
50.0000 ug | Freq: Once | INTRAMUSCULAR | Status: AC
  Administered 2018-05-25: 50 ug via INTRAVENOUS
  Filled 2018-05-25: qty 2
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
## 2018-05-27 ENCOUNTER — Ambulatory Visit (INDEPENDENT_AMBULATORY_CARE_PROVIDER_SITE_OTHER): Payer: Medicaid Other | Admitting: Internal Medicine

## 2018-05-27 ENCOUNTER — Encounter: Payer: Self-pay | Admitting: Internal Medicine

## 2018-05-27 ENCOUNTER — Ambulatory Visit (INDEPENDENT_AMBULATORY_CARE_PROVIDER_SITE_OTHER): Payer: Medicaid Other | Admitting: Licensed Clinical Social Worker

## 2018-05-27 VITALS — BP 119/79 | HR 99 | Temp 98.1°F | Ht 65.0 in | Wt 130.0 lb

## 2018-05-27 DIAGNOSIS — F331 Major depressive disorder, recurrent, moderate: Secondary | ICD-10-CM | POA: Diagnosis not present

## 2018-05-27 DIAGNOSIS — Z23 Encounter for immunization: Secondary | ICD-10-CM

## 2018-05-27 DIAGNOSIS — L03114 Cellulitis of left upper limb: Secondary | ICD-10-CM | POA: Diagnosis not present

## 2018-05-27 DIAGNOSIS — B20 Human immunodeficiency virus [HIV] disease: Secondary | ICD-10-CM

## 2018-05-27 MED ORDER — CEPHALEXIN 500 MG PO CAPS: 500.0000 mg | ORAL_CAPSULE | Freq: Four times a day (QID) | ORAL | 0 refills | Status: DC
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
## 2018-05-27 NOTE — Patient Instructions (Signed)
Come back 2 wk before next visit for labs
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
## 2018-06-05 ENCOUNTER — Institutional Professional Consult (permissible substitution): Payer: Medicaid Other | Admitting: Licensed Clinical Social Worker

## 2018-06-10 ENCOUNTER — Ambulatory Visit (INDEPENDENT_AMBULATORY_CARE_PROVIDER_SITE_OTHER): Payer: Medicaid Other | Admitting: Licensed Clinical Social Worker

## 2018-06-10 DIAGNOSIS — F331 Major depressive disorder, recurrent, moderate: Secondary | ICD-10-CM
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
## 2018-06-12 ENCOUNTER — Encounter: Payer: Self-pay | Admitting: Obstetrics and Gynecology

## 2018-06-12 ENCOUNTER — Ambulatory Visit (INDEPENDENT_AMBULATORY_CARE_PROVIDER_SITE_OTHER): Payer: Medicaid Other | Admitting: Obstetrics and Gynecology

## 2018-06-12 VITALS — BP 111/80 | HR 81 | Ht 65.0 in | Wt 141.4 lb

## 2018-06-12 DIAGNOSIS — N939 Abnormal uterine and vaginal bleeding, unspecified: Secondary | ICD-10-CM | POA: Diagnosis not present

## 2018-06-12 DIAGNOSIS — D259 Leiomyoma of uterus, unspecified: Secondary | ICD-10-CM | POA: Diagnosis not present

## 2018-06-12 DIAGNOSIS — Z1239 Encounter for other screening for malignant neoplasm of breast: Secondary | ICD-10-CM

## 2018-06-12 LAB — POCT PREGNANCY, URINE: Preg Test, Ur: NEGATIVE

## 2018-06-12 NOTE — Progress Notes (Signed)
GYNECOLOGY OFFICE VISIT NOTE  History:  47 y.o. [id number] here today for AUB. Has regular monthly periods, sometimes last 2-3 weeks. Sometimes shorter than that. Goes through [employer] every 2 hours. Has been heavy like this for as long as she can remember.   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 No known exposures, will test for gonorrhea/chlamydia.  Past Medical History:  Diagnosis Date  . Anemia   . Anxiety   . Fibroid   . Headache(784.0)   . HIV (human immunodeficiency virus infection) ([place])   . Infection    UTI   Past Surgical History:  Procedure Laterality Date  . DILATION AND CURETTAGE OF UTERUS    . HAND TENDON SURGERY     right thumb  . INDUCED ABORTION    . TUBAL LIGATION      Current Outpatient Medications:  .  DESCOVY 200-25 MG tablet, TAKE ONE TABLET BY MOUTH DAILY, Disp: 30 tablet, Rfl: 5 .  ondansetron (ZOFRAN) 8 MG tablet, Take 1 tablet (8 mg total) by mouth every 8 (eight) hours as needed for nausea or vomiting., Disp: 30 tablet, Rfl: 3 .  PREZCOBIX 800-150 MG tablet, TAKE ONE TABLET BY MOUTH DAILY WITH BREAKFAST. SWALLOW WHOLE. DO NOT CRUSH, BREAK, OR CHEW TABLETS. (Patient taking differently: Take 1 tablet by mouth daily with breakfast. ), Disp: 30 tablet, Rfl: 5 .  cephALEXin (KEFLEX) 500 MG capsule, Take 1 capsule (500 mg total) by mouth 4 (four) times daily. (Patient not taking: Reported on 06/12/2018), Disp: 28 capsule, Rfl: 0 .  HYDROcodone-acetaminophen (NORCO/VICODIN) 5-325 MG tablet, Take 1-2 tablets by mouth every 6 hours as needed for pain. (Patient not taking: Reported on 06/12/2018), Disp: 17 tablet, Rfl: 0 .  valACYclovir (VALTREX) 1000 MG tablet, TAKE 1 TABLET(1000 MG) BY MOUTH DAILY (Patient not taking: No sig reported), Disp: 30 tablet, Rfl: 5  The following portions of the patient's history were reviewed and updated as appropriate: allergies, current medications, past family history, past medical history, past social history,  past surgical history and problem list.   Health Maintenance:  Last pap: 02/2017 negative Last mammogram: does not know when she had one last  Review of Systems:  Pertinent items noted in HPI and remainder of comprehensive ROS otherwise negative.   Objective:  Physical Exam BP 111/80   Pulse 81   Ht 5\' 5"  (1.651 m)   Wt 141 lb 6.4 oz (64.1 kg)   LMP 06/03/2018 (Approximate) Comment: Really not sure  BMI 23.53 kg/m  CONSTITUTIONAL: Well-developed, well-nourished female in no acute distress.  HENT:  Normocephalic, atraumatic. External right and left ear normal. Oropharynx is clear and moist EYES: Conjunctivae and EOM are normal. Pupils are equal, round, and reactive to light. No scleral icterus.  NECK: Normal range of motion, supple, no masses SKIN: Skin is warm and dry. No rash noted. Not diaphoretic. No erythema. No pallor. NEUROLOGIC: Alert and oriented to person, place, and time. Normal reflexes, muscle tone coordination. No cranial nerve deficit noted. PSYCHIATRIC: Normal mood and affect. Normal behavior. Normal judgment and thought content. CARDIOVASCULAR: Normal heart rate noted RESPIRATORY: Effort normal, no problems with respiration noted ABDOMEN: Soft, no distention noted.  Small, soft umbilical hernia PELVIC: deferred MUSCULOSKELETAL: Normal range of motion. No edema noted.  Labs and Imaging No results found.  Assessment & Plan:  1. Breast cancer screening - MM 3D SCREEN BREAST BILATERAL; Future  2. Abnormal uterine bleeding (AUB) Patient has long-standing history of heavy menses, she  is very interested in therapy, has nto been on any medication Recommended EMB given age, she is agreeable but will have her return for EMB when she is not on menses Briefly reviewed IUD as initial treatment for AUB, she is agreeable - US PELVIC COMPLETE WITH TRANSVAGINAL; Future  3. Uterine leiomyoma, unspecified location See above   Routine preventative health maintenance  measures emphasized. Please refer to After Visit Summary for other counseling recommendations.   Return in about 4 weeks (around 07/10/2018) for Followup.    Feliz Beam, M.D. [facility]

## 2018-06-12 NOTE — Progress Notes (Signed)
Pt states has always had heavy bleeding since 47 years old but for a while has been having blood clots also. Pt states is bleeding today think its about to stop.

## 2018-06-12 NOTE — Progress Notes (Signed)
Pt Mammogram scheduled for 11/21 at 11:50am at [facility].

## 2018-06-17 ENCOUNTER — Ambulatory Visit (HOSPITAL_COMMUNITY)
Admission: RE | Admit: 2018-06-17 | Discharge: 2018-06-17 | Disposition: A | Discharge status: Home / Self Care | Payer: Medicaid Other | Source: Ambulatory Visit | Attending: Obstetrics and Gynecology | Admitting: Obstetrics and Gynecology

## 2018-06-17 DIAGNOSIS — D259 Leiomyoma of uterus, unspecified: Secondary | ICD-10-CM | POA: Diagnosis not present

## 2018-06-17 DIAGNOSIS — N939 Abnormal uterine and vaginal bleeding, unspecified: Secondary | ICD-10-CM | POA: Diagnosis not present

## 2018-06-20 ENCOUNTER — Ambulatory Visit: Payer: Medicaid Other | Attending: Family Medicine | Admitting: Physician Assistant

## 2018-06-20 ENCOUNTER — Inpatient Hospital Stay: Payer: Medicaid Other

## 2018-06-20 VITALS — BP 103/68 | HR 96 | Temp 99.0°F | Resp 18 | Ht 67.0 in | Wt 139.0 lb

## 2018-06-20 DIAGNOSIS — Z4802 Encounter for removal of sutures: Secondary | ICD-10-CM | POA: Diagnosis not present

## 2018-06-20 DIAGNOSIS — Z09 Encounter for follow-up examination after completed treatment for conditions other than malignant neoplasm: Secondary | ICD-10-CM
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
## 2018-06-26 ENCOUNTER — Ambulatory Visit: Payer: Medicaid Other | Admitting: Licensed Clinical Social Worker

## 2018-06-26 DIAGNOSIS — F331 Major depressive disorder, recurrent, moderate: Secondary | ICD-10-CM
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
## 2018-07-03 ENCOUNTER — Ambulatory Visit: Payer: Self-pay | Admitting: Licensed Clinical Social Worker

## 2018-07-15 ENCOUNTER — Telehealth: Payer: Self-pay | Admitting: *Deleted
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
## 2018-07-18 ENCOUNTER — Ambulatory Visit (INDEPENDENT_AMBULATORY_CARE_PROVIDER_SITE_OTHER): Payer: Medicaid Other | Admitting: Licensed Clinical Social Worker

## 2018-07-18 ENCOUNTER — Ambulatory Visit
Admission: RE | Admit: 2018-07-18 | Discharge: 2018-07-18 | Disposition: A | Discharge status: Home / Self Care | Payer: Medicaid Other | Source: Ambulatory Visit | Attending: Obstetrics and Gynecology | Admitting: Obstetrics and Gynecology

## 2018-07-18 ENCOUNTER — Ambulatory Visit: Payer: Medicaid Other | Admitting: *Deleted

## 2018-07-18 DIAGNOSIS — F331 Major depressive disorder, recurrent, moderate: Secondary | ICD-10-CM | POA: Diagnosis not present

## 2018-07-18 DIAGNOSIS — Z1239 Encounter for other screening for malignant neoplasm of breast: Secondary | ICD-10-CM

## 2018-07-18 DIAGNOSIS — B20 Human immunodeficiency virus [HIV] disease: Secondary | ICD-10-CM

## 2018-07-19 NOTE — BH Specialist Note (Signed)
Integrated Behavioral Health Follow Up Visit  MRN: [id number] Name: Dawn [name]  Number of [place] Clinician visits: 4/6 Session Start time: 2:34pm  Session End time: 3:08pm Total time: 35 minutes  Type of Service: [place] Interpretor:No. Interpretor Name and Language: n/a  SUBJECTIVE: Dawn [name] is a 47 y.o. female accompanied by self Patient reports the following symptoms/concerns: mood swings, irritability, frustration with housemates and general circumstances, days of not wanting to get out of bed, difficulty concentrating at times  OBJECTIVE: Mood: Depressed and Affect: Constricted Risk of harm to self or others: No plan to harm self or others  LIFE CONTEXT: Patient reports that she has been feeling okay, but that she is struggling with the others who live at the rooming house with her. She describes them asking her for favors or for food, and verbalizes frustration that while she likes to help they don't do much to help themselves. Patient met with [employer] but does not want her to visit at this rooming house as others will be too curious and ask difficulty questions. Patient states she has been taking her medications every day and will contact [place] when she gets a new place to live or if she starts having trouble with her medications.   GOALS ADDRESSED: Patient will: 1.  Reduce symptoms of: depression   INTERVENTIONS: Interventions utilized:  Solution-Focused Strategies and Supportive Counseling  ASSESSMENT: Patient currently experiencing frustration and anxiety, mood instability, difficulty concentrating and low motivation. She reports that some days are better than others, and she is very focused on getting into a new living situation at this time. Counselor commended patient on her desire to be independent and guided her in exploring what she needs to get there. 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 She has decided to buy a computer an study on her own to get the license so that she can get a job and her own place. Counselor and patient discussed the obstacles to this, and brainstormed ways to overcome them. Patient reports that she does not want other people's help, she wants to be able to make changes on her own. Counselor validated this thought process, and pointed out that sometimes the things one can do on their own is ask for help. Counselor and patient explored times when patient may need to ask for help to do things on her own. Patient and counselor explored boundaries patient has set regarding helping others, and how these do and do not relate to her asking for help.   Counselor explored with patient her Thanksgiving plans. Patient reports that her family has invited her home, but when she goes there they often put her down and sometimes ask her intrusive questions. She shares how she has had to leave because of this before. Due to this, this year patient will spend the holiday in town with her friend. Counselor commended patient on setting boundaries that are healthy for her an explored with her what healthy boundaries with friend may look like.   Patient may benefit from ongoing CBT and Reality Therapy sessions.  PLAN: 1. Follow up with behavioral health clinician on : 08/12/18  Lillie Fragmin, LCSW

## 2018-07-22 ENCOUNTER — Ambulatory Visit: Payer: Self-pay | Admitting: Family Medicine

## 2018-07-29 ENCOUNTER — Ambulatory Visit: Payer: Self-pay | Admitting: Licensed Clinical Social Worker

## 2018-07-29 ENCOUNTER — Other Ambulatory Visit: Payer: Self-pay | Admitting: *Deleted

## 2018-07-29 ENCOUNTER — Other Ambulatory Visit: Payer: Medicaid Other

## 2018-07-29 DIAGNOSIS — B20 Human immunodeficiency virus [HIV] disease: Secondary | ICD-10-CM

## 2018-07-30 LAB — T-HELPER CELL (CD4) - (RCID CLINIC ONLY)
CD4 % Helper T Cell: 37 % (ref 33–55)
CD4 T Cell Abs: 440 /uL (ref 400–2700)

## 2018-07-31 LAB — COMPLETE METABOLIC PANEL WITH GFR
AG Ratio: 1.1 (calc) (ref 1.0–2.5)
ALT: 10 U/L (ref 6–29)
AST: 16 U/L (ref 10–35)
Albumin: 3.9 g/dL (ref 3.6–5.1)
Alkaline phosphatase (APISO): 48 U/L (ref 33–115)
BUN: 13 mg/dL (ref 7–25)
CO2: 25 mmol/L (ref 20–32)
Calcium: 9 mg/dL (ref 8.6–10.2)
Chloride: 107 mmol/L (ref 98–110)
Creat: 1 mg/dL (ref 0.50–1.10)
GFR, Est African American: 79 mL/min/{1.73_m2} (ref >=60)
GFR, Est Non African American: 68 mL/min/{1.73_m2} (ref >=60)
Globulin: 3.5 g/dL (calc) (ref 1.9–3.7)
Glucose, Bld: 83 mg/dL (ref 65–99)
Potassium: 4 mmol/L (ref 3.5–5.3)
Sodium: 139 mmol/L (ref 135–146)
Total Bilirubin: 0.3 mg/dL (ref 0.2–1.2)
Total Protein: 7.4 g/dL (ref 6.1–8.1)

## 2018-07-31 LAB — CBC WITH DIFFERENTIAL/PLATELET
Basophils Absolute: 10 cells/uL (ref 0–200)
Basophils Relative: 0.4 %
Eosinophils Absolute: 50 cells/uL (ref 15–500)
Eosinophils Relative: 2.1 %
HCT: 26.1 % — ABNORMAL LOW (ref 35.0–45.0)
Hemoglobin: 7.6 g/dL — ABNORMAL LOW (ref 11.7–15.5)
Lymphs Abs: 1145 cells/uL (ref 850–3900)
MCH: 20.4 pg — ABNORMAL LOW (ref 27.0–33.0)
MCHC: 29.1 g/dL — ABNORMAL LOW (ref 32.0–36.0)
MCV: 70.2 fL — ABNORMAL LOW (ref 80.0–100.0)
MPV: 10.9 fL (ref 7.5–12.5)
Monocytes Relative: 10.8 %
Neutro Abs: 936 cells/uL — ABNORMAL LOW (ref 1500–7800)
Neutrophils Relative %: 39 %
Platelets: 300 10*3/uL (ref 140–400)
RBC: 3.72 10*6/uL — ABNORMAL LOW (ref 3.80–5.10)
RDW: 19.1 % — ABNORMAL HIGH (ref 11.0–15.0)
Total Lymphocyte: 47.7 %
WBC mixed population: 259 cells/uL (ref 200–950)
WBC: 2.4 10*3/uL — ABNORMAL LOW (ref 3.8–10.8)

## 2018-07-31 LAB — HIV-1 RNA QUANT-NO REFLEX-BLD
HIV 1 RNA Quant: 56 copies/mL — ABNORMAL HIGH
HIV-1 RNA Quant, Log: 1.75 Log copies/mL — ABNORMAL HIGH

## 2018-08-05 ENCOUNTER — Ambulatory Visit: Payer: Medicaid Other | Admitting: Obstetrics and Gynecology

## 2018-08-12 ENCOUNTER — Ambulatory Visit (INDEPENDENT_AMBULATORY_CARE_PROVIDER_SITE_OTHER): Payer: Medicaid Other | Admitting: Licensed Clinical Social Worker

## 2018-08-12 ENCOUNTER — Ambulatory Visit (INDEPENDENT_AMBULATORY_CARE_PROVIDER_SITE_OTHER): Payer: Medicaid Other | Admitting: Internal Medicine

## 2018-08-12 ENCOUNTER — Encounter: Payer: Self-pay | Admitting: Internal Medicine

## 2018-08-12 ENCOUNTER — Ambulatory Visit: Payer: Self-pay | Admitting: Family Medicine

## 2018-08-12 DIAGNOSIS — F331 Major depressive disorder, recurrent, moderate: Secondary | ICD-10-CM | POA: Diagnosis not present

## 2018-08-12 DIAGNOSIS — Z23 Encounter for immunization: Secondary | ICD-10-CM | POA: Diagnosis not present

## 2018-08-12 DIAGNOSIS — F329 Major depressive disorder, single episode, unspecified: Secondary | ICD-10-CM | POA: Diagnosis not present

## 2018-08-12 DIAGNOSIS — B20 Human immunodeficiency virus [HIV] disease: Secondary | ICD-10-CM | POA: Diagnosis present

## 2018-08-12 DIAGNOSIS — Z79899 Other long term (current) drug therapy: Secondary | ICD-10-CM | POA: Diagnosis not present
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
## 2018-08-12 NOTE — BH Specialist Note (Signed)
Integrated Behavioral Health Follow Up Visit  MRN: [id number] Name: Dawn [name]  Number of [place] Clinician visits: 5/6 Session Start time: 10:50 am  Session End time: 11:30am Total time: 40 minutes  Type of Service: [place] Interpretor:No. Interpretor Name and Language: n/a  SUBJECTIVE: Dawn [name] is a 47 y.o. female accompanied by self Patient reports the following symptoms/concerns: depressed mood, irritability, anxiety  OBJECTIVE: Mood: Depressed and Affect: Constricted Risk of harm to self or others: No plan to harm self or others  LIFE CONTEXT: Patient reports she is planning to move within the month, and that she is feeling very good about this plan. She indicates that the place she has found is near [facility] and this will allow her access to resources and tools like a computer/internet. Patient shares that she has also begun working at [employer] and is enjoying the work very much. She states that she is meeting lots of interesting people and likes feeling that she is helping out.   GOALS ADDRESSED: Patient will: 1.  Reduce symptoms of: depression   INTERVENTIONS: Interventions utilized:  Solution-Focused Strategies and Supportive Counseling   ASSESSMENT: Patient currently experiencing depressed mood, anxiety about the future, some irritability, difficulty concentrating. She reports belief that she will feel much better when she is out of the rooming house that she has been living in. Counselor processed with patient her decision to move and her thoughts/feelings about the place she is moving to. Patient was able to identify that she will have more money left from rent than she does know, and to verbalize why she believes this place will be safer/better for her. Counselor and patient explored patient's needs as she moves into the new place. Patient talked about wanting to be independent, and stated that her  family often tells her she will never be. She indicated that her family members have said they would kill themselves if they were HIV+. Counselor pointed out that this is speaking from a place of not understanding that HIV is not a death sentence these days. Patient shared how proud of herself she is for having an undetectable viral load, and stated that she does not think about death or wanting to die like she used to, because she wants to experience what life has to offer her. Counselor guided her to identify things she wants to do in her life. Patient identified that she wants to see her two youngest children again someday, and that she wants to live a life where she can be independent and take care of herself.    Patient may benefit from ongoing Solution-Focused and CBT sessions  PLAN: 1. Follow up with behavioral health clinician on : 09/09/18   Lillie Fragmin, LCSW

## 2018-08-12 NOTE — Progress Notes (Signed)
RFV;  Patient ID: Dawn [name], female   DOB: 07-18-1971, 47 y.o.   MRN: [id number]  HPI Dawn [name] is a 47yo F with HIV disease, CD 4 count 440/VL 56 on descovy-DRVc.  Doing well with adherence for the most past but thinks she has missed 3-4 doses this month. Looking to move from her current location still feels not stable, -being taken advantage by the people that life there. Her arm has healed- from incision.   Outpatient Encounter Medications as of 08/12/2018  Medication Sig  . DESCOVY 200-25 MG tablet TAKE ONE TABLET BY MOUTH DAILY  . ondansetron (ZOFRAN) 8 MG tablet Take 1 tablet (8 mg total) by mouth every 8 (eight) hours as needed for nausea or vomiting.  Marland Kitchen PREZCOBIX 800-150 MG tablet TAKE ONE TABLET BY MOUTH DAILY WITH BREAKFAST. SWALLOW WHOLE. DO NOT CRUSH, BREAK, OR CHEW TABLETS. (Patient taking differently: Take 1 tablet by mouth daily with breakfast. )  . valACYclovir (VALTREX) 1000 MG tablet TAKE 1 TABLET(1000 MG) BY MOUTH DAILY   No facility-administered encounter medications on file as of 08/12/2018.      Patient Active Problem List   Diagnosis Date Noted  . HIV disease ([place]) 03/05/2017  . HTN (hypertension) 03/05/2017  . Anemia due to chronic blood loss 03/13/2016  . Acquired immunodeficiency syndrome ([place]) 12/07/2015  . Acne vulgaris 12/07/2015  . Episodic mood disorder ([place]) 12/07/2015     There are no preventive care reminders to display for this patient.   Review of Systems Constitutional: Negative for fever, chills, diaphoresis, activity change, appetite change, fatigue and unexpected weight change.  HENT: Negative for congestion, sore throat, rhinorrhea, sneezing, trouble swallowing and sinus pressure.  Eyes: Negative for photophobia and visual disturbance.  Respiratory: Negative for cough, chest tightness, shortness of breath, wheezing and stridor.  Cardiovascular: Negative for chest pain, palpitations and leg swelling.  Gastrointestinal: Negative for  nausea, vomiting, abdominal pain, diarrhea, constipation, blood in stool, abdominal distention and anal bleeding.  Genitourinary: Negative for dysuria, hematuria, flank pain and difficulty urinating.  Musculoskeletal: Negative for myalgias, back pain, joint swelling, arthralgias and gait problem.  Skin: Negative for color change, pallor, rash and wound.  Neurological: Negative for dizziness, tremors, weakness and light-headedness.  Hematological: Negative for adenopathy. Does not bruise/bleed easily.  Psychiatric/Behavioral: Negative for behavioral problems, confusion, sleep disturbance, dysphoric mood, decreased concentration and agitation.    Physical Exam   There were no vitals taken for this visit. Physical Exam  Constitutional:  oriented to person, place, and time. appears well-developed and well-nourished. No distress.  HENT: [place]/AT, PERRLA, no scleral icterus Mouth/Throat: Oropharynx is clear and moist. No oropharyngeal exudate.  Cardiovascular: Normal rate, regular rhythm and normal heart sounds. Exam reveals no gallop and no friction rub.  No murmur heard.  Pulmonary/Chest: Effort normal and breath sounds normal. No respiratory distress.  has no wheezes.  Neck = supple, no nuchal rigidity Abdominal: Soft. Bowel sounds are normal.  exhibits no distension. There is no tenderness.  Lymphadenopathy: no cervical adenopathy. No axillary adenopathy Neurological: alert and oriented to person, place, and time.  Skin: Skin is warm and dry. No rash noted. No erythema.  Psychiatric: a normal mood and affect.  behavior is normal.    Lab Results  Component Value Date   CD4TCELL 37 07/29/2018   Lab Results  Component Value Date   CD4TABS 440 07/29/2018   CD4TABS 390 (L) 05/06/2018   CD4TABS 360 (L) 01/15/2018   Lab Results  Component Value Date  HIV1RNAQUANT 56 (H) 07/29/2018   Lab Results  Component Value Date   HEPBSAB POS (A) 12/09/2015   Lab Results  Component Value Date     LABRPR NON-REACTIVE 05/06/2018    CBC Lab Results  Component Value Date   WBC 2.4 (L) 07/29/2018   RBC 3.72 (L) 07/29/2018   HGB 7.6 (L) 07/29/2018   HCT 26.1 (L) 07/29/2018   PLT 300 07/29/2018   MCV 70.2 (L) 07/29/2018   MCH 20.4 (L) 07/29/2018   MCHC 29.1 (L) 07/29/2018   RDW 19.1 (H) 07/29/2018   LYMPHSABS 1,145 07/29/2018   MONOABS 310 03/05/2017   EOSABS 50 07/29/2018    BMET Lab Results  Component Value Date   NA 139 07/29/2018   K 4.0 07/29/2018   CL 107 07/29/2018   CO2 25 07/29/2018   GLUCOSE 83 07/29/2018   BUN 13 07/29/2018   CREATININE 1.00 07/29/2018   CALCIUM 9.0 07/29/2018   GFRNONAA 68 07/29/2018   GFRAA 79 07/29/2018      Assessment and Plan  hiv disease = continue on current regimen. adherence counseling to improve from current practices  Long-term medication management = cr is stable on current regimen  Coping/depression = continues to meet with clinic counselor, with Rollene Fare today  Health maintenance = will get prevnar today

## 2018-08-30 ENCOUNTER — Other Ambulatory Visit: Payer: Self-pay | Admitting: Internal Medicine

## 2018-09-04 ENCOUNTER — Ambulatory Visit: Payer: Medicaid Other | Admitting: Obstetrics and Gynecology

## 2018-09-09 ENCOUNTER — Ambulatory Visit: Payer: Medicaid Other | Admitting: Licensed Clinical Social Worker

## 2018-09-13 ENCOUNTER — Ambulatory Visit: Payer: Self-pay | Admitting: Family Medicine

## 2018-11-16 ENCOUNTER — Other Ambulatory Visit: Payer: Self-pay | Admitting: Internal Medicine

## 2018-11-16 DIAGNOSIS — B20 Human immunodeficiency virus [HIV] disease: Secondary | ICD-10-CM

## 2018-12-05 ENCOUNTER — Ambulatory Visit: Payer: Medicaid Other | Admitting: Family

## 2018-12-25 ENCOUNTER — Other Ambulatory Visit: Payer: Self-pay | Admitting: Behavioral Health

## 2018-12-25 ENCOUNTER — Other Ambulatory Visit: Payer: Self-pay | Admitting: Internal Medicine

## 2018-12-25 DIAGNOSIS — B20 Human immunodeficiency virus [HIV] disease: Secondary | ICD-10-CM

## 2018-12-25 MED ORDER — EMTRICITABINE-TENOFOVIR AF 200-25 MG PO TABS: 1.0000 | ORAL_TABLET | Freq: Every day | ORAL | 2 refills | Status: DC

## 2018-12-25 MED ORDER — DARUNAVIR-COBICISTAT 800-150 MG PO TABS: ORAL_TABLET | ORAL | 2 refills | Status: DC

## 2019-01-08 ENCOUNTER — Encounter: Payer: Self-pay | Admitting: Family

## 2019-01-08 ENCOUNTER — Other Ambulatory Visit: Payer: Self-pay

## 2019-01-08 ENCOUNTER — Ambulatory Visit (INDEPENDENT_AMBULATORY_CARE_PROVIDER_SITE_OTHER): Payer: Medicaid Other | Admitting: Family

## 2019-01-08 DIAGNOSIS — R05 Cough: Secondary | ICD-10-CM

## 2019-01-08 DIAGNOSIS — R509 Fever, unspecified: Secondary | ICD-10-CM | POA: Diagnosis not present

## 2019-01-08 DIAGNOSIS — M791 Myalgia, unspecified site: Secondary | ICD-10-CM | POA: Diagnosis not present

## 2019-01-08 DIAGNOSIS — B349 Viral infection, unspecified: Secondary | ICD-10-CM | POA: Diagnosis not present

## 2019-01-08 DIAGNOSIS — B20 Human immunodeficiency virus [HIV] disease: Secondary | ICD-10-CM

## 2019-01-08 DIAGNOSIS — Z21 Asymptomatic human immunodeficiency virus [HIV] infection status: Secondary | ICD-10-CM

## 2019-01-08 NOTE — Assessment & Plan Note (Signed)
Dawn [name] has symptoms of a viral illness with symptoms starting within the past 24 hours. This is likely an upper respiratory illness at present. I do have concern that her boyfriend works at a [place] was closed with concern for coronavirus. He however has remained symptoms free. She has no shortness of breath or red flag symptoms at present time. Recommend treating symptomatically for now and will recheck with our triage line in 24-48 hours if not improving. At that time would consider testing for Coronavirus if indicated through outpatient testing sites.

## 2019-01-08 NOTE — Progress Notes (Signed)
Subjective:    Patient ID: Dawn [name], female    DOB: 05-15-1971, 48 y.o.   MRN: [id number]  Chief Complaint  Patient presents with  . sick visit     Virtual Visit via Telephone Note   I connected with Dawn [name] on 01/08/2019 at  4:00 PM by telephone and verified that I am speaking with the correct person using two identifiers.   I discussed the limitations, risks, security and privacy concerns of performing an evaluation and management service by telephone and the availability of in person appointments. I also discussed with the patient that there may be a patient responsible charge related to this service. The patient expressed understanding and agreed to proceed.   HPI:  Dawn [name] is a 48 y.o. female with HIV who called our triage nurse line with concern of fever, cough and body aches of about 24 hour duration. Cough is described as productive with clear mucus. She has no shortness of breath vomiting, or sore throat at present. She did have diarrhea for about 24 hours 2 days prior which resolved with OTC medication. Unable to take her temperature at home as she does not have a thermometer. Feelings of being feverish wax and wane throughout the day.   Dawn [name] boyfriend does work at a [employer] where there was suspected Coronavirus. The factory was shut down and clean then re-opened after 2 weeks. She believes that her boyfriend was tested but she is not sure and unaware of the results. He has not had any symptoms at present. She continues to take her HIV medications as prescribed with no adverse side effects or missed doses.   Allergies  Allergen Reactions  . Chocolate Hives  . Tomato Hives      Outpatient Medications Prior to Visit  Medication Sig Dispense Refill  . darunavir-cobicistat (PREZCOBIX) 800-150 MG tablet TAKE ONE TABLET BY MOUTH DAILY WITH BREAKFAST. SWALLOW WHOLE. DO NOT CRUSH, BREAK, OR CHEW TABLETS. 30 tablet 2  . emtricitabine-tenofovir  AF (DESCOVY) 200-25 MG tablet Take 1 tablet by mouth daily. 30 tablet 2  . ondansetron (ZOFRAN) 8 MG tablet TAKE ONE TABLET BY MOUTH EVERY 8 HOURS AS NEEDED FOR NAUSEA OR VOMITING 30 tablet 0  . valACYclovir (VALTREX) 1000 MG tablet TAKE 1 TABLET(1000 MG) BY MOUTH DAILY 30 tablet 5   No facility-administered medications prior to visit.      Past Medical History:  Diagnosis Date  . Anemia   . Anxiety   . Fibroid   . Headache(784.0)   . HIV (human immunodeficiency virus infection) ([place])   . Infection    UTI     Past Surgical History:  Procedure Laterality Date  . DILATION AND CURETTAGE OF UTERUS    . HAND TENDON SURGERY     right thumb  . INDUCED ABORTION    . TUBAL LIGATION         Review of Systems  Constitutional: Positive for chills and fever. Negative for fatigue.  HENT: Positive for congestion. Negative for sinus pressure, sinus pain, sore throat, trouble swallowing and voice change.   Respiratory: Positive for cough. Negative for chest tightness, shortness of breath and wheezing.       Objective:    Nursing note and vital signs reviewed.    Dawn [name] is pleasant to speak with. She is able to speak in full sentences during our conversation and does not sound short of breath. She did not cough during our conversation.  Assessment & Plan:  Problem List Items Addressed This Visit      Other   HIV disease ([place])    Viral load well controlled with last blood work and CD4 count of over 400. Continue current Descovy and Prezcobix.      Viral illness - Primary    Dawn [name] has symptoms of a viral illness with symptoms starting within the past 24 hours. This is likely an upper respiratory illness at present. I do have concern that her boyfriend works at a [place] was closed with concern for coronavirus. He however has remained symptoms free. She has no shortness of breath or red flag symptoms at present time. Recommend treating  symptomatically for now and will recheck with our triage line in 24-48 hours if not improving. At that time would consider testing for Coronavirus if indicated through outpatient testing sites.          I am having [employer] maintain her valACYclovir, ondansetron, darunavir-cobicistat, and emtricitabine-tenofovir AF.   I discussed the assessment and treatment plan with the patient. The patient was provided an opportunity to ask questions and all were answered. The patient agreed with the plan and demonstrated an understanding of the instructions.   The patient was advised to call back or seek an in-person evaluation if the symptoms worsen or if the condition fails to improve as anticipated.   I provided  14  minutes of non-face-to-face time during this encounter.  Follow-up: Return if symptoms worsen or fail to improve.   Terri Piedra, MSN, FNP-C Nurse Practitioner [facility] for Infectious Disease [place] number: [phone]

## 2019-01-08 NOTE — Patient Instructions (Signed)
Nice to speak with you.  I am sorry you do not feel well.   Recommend treating your symptoms with over the counter medications at this time.   If you develop shortness of breath or difficulty breathing go to the emergency room.  General Recommendations:    Please drink plenty of fluids.  Get plenty of rest   Sleep in humidified air  Use saline nasal sprays  Netti pot   OTC Medications:  Decongestants - helps relieve congestion   Flonase (generic fluticasone) or Nasacort (generic triamcinolone) - please make sure to use the "cross-over" technique at a 45 degree angle towards the opposite eye as opposed to straight up the nasal passageway.   Sudafed (generic pseudoephedrine - Note this is the one that is available behind the pharmacy counter); Products with phenylephrine (-PE) may also be used but is often not as effective as pseudoephedrine.   If you have HIGH BLOOD PRESSURE - Coricidin HBP; AVOID any product that is -D as this contains pseudoephedrine which may increase your blood pressure.  Afrin (oxymetazoline) every 6-8 hours for up to 3 days.   Allergies - helps relieve runny nose, itchy eyes and sneezing   Claritin (generic loratidine), Allegra (fexofenidine), or Zyrtec (generic cyrterizine) for runny nose. These medications should not cause drowsiness.  Note - Benadryl (generic diphenhydramine) may be used however may cause drowsiness  Cough -   Delsym or Robitussin (generic dextromethorphan)  Expectorants - helps loosen mucus to ease removal   Mucinex (generic guaifenesin) as directed on the package.  Headaches / General Aches   Tylenol (generic acetaminophen) - DO NOT EXCEED 3 grams (3,000 mg) in a 24 hour time period  Advil/Motrin (generic ibuprofen)   Sore Throat -   Salt water gargle   Chloraseptic (generic benzocaine) spray or lozenges / Sucrets (generic dyclonine)    This information is directly available on the CDC website:  RunningShows.co.za.html    Source:CDC Reference to specific commercial products, manufacturers, companies, or trademarks does not constitute its endorsement or recommendation by the [place], [place], or Centers for [employer] and Prevention.

## 2019-01-08 NOTE — Assessment & Plan Note (Signed)
Viral load well controlled with last blood work and CD4 count of over 400. Continue current Descovy and Prezcobix.

## 2019-02-04 ENCOUNTER — Other Ambulatory Visit: Payer: Self-pay | Admitting: *Deleted

## 2019-02-04 ENCOUNTER — Other Ambulatory Visit: Payer: Self-pay

## 2019-02-04 ENCOUNTER — Other Ambulatory Visit: Payer: Medicaid Other

## 2019-02-04 DIAGNOSIS — B20 Human immunodeficiency virus [HIV] disease: Secondary | ICD-10-CM

## 2019-02-05 LAB — T-HELPER CELL (CD4) - (RCID CLINIC ONLY)
CD4 % Helper T Cell: 40 % (ref 33–65)
CD4 T Cell Abs: 420 /uL (ref 400–1790)

## 2019-02-15 LAB — CBC WITH DIFFERENTIAL/PLATELET
Absolute Monocytes: 298 cells/uL (ref 200–950)
Basophils Absolute: 19 cells/uL (ref 0–200)
Basophils Relative: 0.6 %
Eosinophils Absolute: 31 cells/uL (ref 15–500)
Eosinophils Relative: 1 %
HCT: 37.3 % (ref 35.0–45.0)
Hemoglobin: 11.6 g/dL — ABNORMAL LOW (ref 11.7–15.5)
Lymphs Abs: 1308 cells/uL (ref 850–3900)
MCH: 25.7 pg — ABNORMAL LOW (ref 27.0–33.0)
MCHC: 31.1 g/dL — ABNORMAL LOW (ref 32.0–36.0)
MCV: 82.5 fL (ref 80.0–100.0)
MPV: 11.9 fL (ref 7.5–12.5)
Monocytes Relative: 9.6 %
Neutro Abs: 1445 cells/uL — ABNORMAL LOW (ref 1500–7800)
Neutrophils Relative %: 46.6 %
Platelets: 261 10*3/uL (ref 140–400)
RBC: 4.52 10*6/uL (ref 3.80–5.10)
RDW: 18.2 % — ABNORMAL HIGH (ref 11.0–15.0)
Total Lymphocyte: 42.2 %
WBC: 3.1 10*3/uL — ABNORMAL LOW (ref 3.8–10.8)

## 2019-02-15 LAB — HIV-1 RNA QUANT-NO REFLEX-BLD
HIV 1 RNA Quant: <20 copies/mL — AB
HIV-1 RNA Quant, Log: <1.3 Log copies/mL — AB

## 2019-02-15 LAB — COMPLETE METABOLIC PANEL WITH GFR
AG Ratio: 1.2 (calc) (ref 1.0–2.5)
ALT: 11 U/L (ref 6–29)
AST: 13 U/L (ref 10–35)
Albumin: 4.1 g/dL (ref 3.6–5.1)
Alkaline phosphatase (APISO): 56 U/L (ref 31–125)
BUN: 11 mg/dL (ref 7–25)
CO2: 24 mmol/L (ref 20–32)
Calcium: 9.4 mg/dL (ref 8.6–10.2)
Chloride: 105 mmol/L (ref 98–110)
Creat: 1.07 mg/dL (ref 0.50–1.10)
GFR, Est African American: 73 mL/min/{1.73_m2} (ref >=60)
GFR, Est Non African American: 63 mL/min/{1.73_m2} (ref >=60)
Globulin: 3.4 g/dL (calc) (ref 1.9–3.7)
Glucose, Bld: 78 mg/dL (ref 65–99)
Potassium: 3.8 mmol/L (ref 3.5–5.3)
Sodium: 139 mmol/L (ref 135–146)
Total Bilirubin: 0.5 mg/dL (ref 0.2–1.2)
Total Protein: 7.5 g/dL (ref 6.1–8.1)

## 2019-02-26 ENCOUNTER — Encounter: Payer: Medicaid Other | Admitting: Internal Medicine

## 2019-02-27 ENCOUNTER — Other Ambulatory Visit: Payer: Self-pay

## 2019-02-27 ENCOUNTER — Ambulatory Visit (INDEPENDENT_AMBULATORY_CARE_PROVIDER_SITE_OTHER): Payer: Medicaid Other | Admitting: Internal Medicine

## 2019-02-27 DIAGNOSIS — B20 Human immunodeficiency virus [HIV] disease: Secondary | ICD-10-CM

## 2019-02-27 NOTE — Progress Notes (Signed)
Unable to reach by phone

## 2019-03-07 ENCOUNTER — Emergency Department (HOSPITAL_COMMUNITY): Payer: Medicaid Other

## 2019-03-07 ENCOUNTER — Encounter (HOSPITAL_COMMUNITY): Payer: Self-pay

## 2019-03-07 ENCOUNTER — Emergency Department (HOSPITAL_COMMUNITY)
Admission: EM | Admit: 2019-03-07 | Discharge: 2019-03-07 | Disposition: A | Discharge status: Home / Self Care | Payer: Medicaid Other | Attending: Emergency Medicine | Admitting: Emergency Medicine

## 2019-03-07 ENCOUNTER — Other Ambulatory Visit: Payer: Self-pay

## 2019-03-07 DIAGNOSIS — Z79899 Other long term (current) drug therapy: Secondary | ICD-10-CM | POA: Insufficient documentation

## 2019-03-07 DIAGNOSIS — I10 Essential (primary) hypertension: Secondary | ICD-10-CM | POA: Diagnosis not present

## 2019-03-07 DIAGNOSIS — N939 Abnormal uterine and vaginal bleeding, unspecified: Secondary | ICD-10-CM | POA: Insufficient documentation

## 2019-03-07 DIAGNOSIS — D259 Leiomyoma of uterus, unspecified: Secondary | ICD-10-CM | POA: Diagnosis not present

## 2019-03-07 DIAGNOSIS — Z87891 Personal history of nicotine dependence: Secondary | ICD-10-CM | POA: Diagnosis not present

## 2019-03-07 DIAGNOSIS — B20 Human immunodeficiency virus [HIV] disease: Secondary | ICD-10-CM | POA: Insufficient documentation

## 2019-03-07 LAB — PREGNANCY, URINE: Preg Test, Ur: NEGATIVE

## 2019-03-07 LAB — CBC WITH DIFFERENTIAL/PLATELET
Abs Immature Granulocytes: 0.01 10*3/uL (ref 0.00–0.07)
Basophils Absolute: 0 10*3/uL (ref 0.0–0.1)
Basophils Relative: 0 %
Eosinophils Absolute: 0 10*3/uL (ref 0.0–0.5)
Eosinophils Relative: 1 %
HCT: 34.6 % — ABNORMAL LOW (ref 36.0–46.0)
Hemoglobin: 10.8 g/dL — ABNORMAL LOW (ref 12.0–15.0)
Immature Granulocytes: 0 %
Lymphocytes Relative: 39 %
Lymphs Abs: 1.3 10*3/uL (ref 0.7–4.0)
MCH: 27.6 pg (ref 26.0–34.0)
MCHC: 31.2 g/dL (ref 30.0–36.0)
MCV: 88.5 fL (ref 80.0–100.0)
Monocytes Absolute: 0.4 10*3/uL (ref 0.1–1.0)
Monocytes Relative: 13 %
Neutro Abs: 1.6 10*3/uL — ABNORMAL LOW (ref 1.7–7.7)
Neutrophils Relative %: 47 %
Platelets: 262 10*3/uL (ref 150–400)
RBC: 3.91 MIL/uL (ref 3.87–5.11)
RDW: 17.7 % — ABNORMAL HIGH (ref 11.5–15.5)
WBC: 3.4 10*3/uL — ABNORMAL LOW (ref 4.0–10.5)
nRBC: 0 % (ref 0.0–0.2)

## 2019-03-07 LAB — COMPREHENSIVE METABOLIC PANEL
ALT: 25 U/L (ref 0–44)
AST: 27 U/L (ref 15–41)
Albumin: 3.7 g/dL (ref 3.5–5.0)
Alkaline Phosphatase: 50 U/L (ref 38–126)
Anion gap: 8 (ref 5–15)
BUN: 9 mg/dL (ref 6–20)
CO2: 24 mmol/L (ref 22–32)
Calcium: 8.6 mg/dL — ABNORMAL LOW (ref 8.9–10.3)
Chloride: 106 mmol/L (ref 98–111)
Creatinine, Ser: 0.88 mg/dL (ref 0.44–1.00)
GFR calc Af Amer: >60 mL/min (ref >60)
GFR calc non Af Amer: >60 mL/min (ref >60)
Glucose, Bld: 95 mg/dL (ref 70–99)
Potassium: 3.5 mmol/L (ref 3.5–5.1)
Sodium: 138 mmol/L (ref 135–145)
Total Bilirubin: 0.1 mg/dL — ABNORMAL LOW (ref 0.3–1.2)
Total Protein: 7.3 g/dL (ref 6.5–8.1)

## 2019-03-07 LAB — WET PREP, GENITAL
Clue Cells Wet Prep HPF POC: NONE SEEN
Sperm: NONE SEEN
Trich, Wet Prep: NONE SEEN
Yeast Wet Prep HPF POC: NONE SEEN

## 2019-03-07 NOTE — ED Provider Notes (Signed)
[facility] DEPT Provider Note   CSN: [id number] Arrival date & time: 03/07/19  0200    History   Chief Complaint Chief Complaint  Patient presents with   Vaginal Bleeding    HPI Dawn [name] is a 48 y.o. female.     HPI   Dawn [name] is a 48 y.o. female, with a history of anemia, anxiety, fibroids, HIV, presenting to the ED with vaginal bleeding beginning around 11 AM 7/9.  States she has had intermittent spotting throughout the month for several months and had what she thought was a normal menstrual cycle at the end of June.  However, yesterday she states she began to have constantly dripping, bright red blood.  She states she is only had to use 2 heavy pads.  She has had previous instances of this issue before, but has not followed through with evaluation stating that she was scared. Denies anticoagulation. Denies fever/chills, abdominal pain, N/V/D, flank/back pain, chest pain, shortness of breath, dizziness, syncope, or any other complaints.  HIV viral load and CD4 count were performed June 9 and were undetectable and 420, respectively.  Past Medical History:  Diagnosis Date   Anemia    Anxiety    Fibroid    Headache(784.0)    HIV (human immunodeficiency virus infection) ([place])    Infection    UTI    Patient Active Problem List   Diagnosis Date Noted   Viral illness 01/08/2019   HIV disease ([place]) 03/05/2017   HTN (hypertension) 03/05/2017   Anemia due to chronic blood loss 03/13/2016   Acquired immunodeficiency syndrome ([place]) 12/07/2015   Acne vulgaris 12/07/2015   Episodic mood disorder ([place]) 12/07/2015    Past Surgical History:  Procedure Laterality Date   DILATION AND CURETTAGE OF UTERUS     HAND TENDON SURGERY     right thumb   INDUCED ABORTION     TUBAL LIGATION       OB History    Gravida  6   Para  4   Term  4   Preterm      AB  2   Living  4     SAB  1   TAB  1   Ectopic        Multiple      Live Births  4            Home Medications    Prior to Admission medications   Medication Sig Start Date End Date Taking? Authorizing Provider  darunavir-cobicistat (PREZCOBIX) 800-150 MG tablet TAKE ONE TABLET BY MOUTH DAILY WITH BREAKFAST. SWALLOW WHOLE. DO NOT CRUSH, BREAK, OR CHEW TABLETS. 12/25/18  Yes Carlyle Basques, MD  emtricitabine-tenofovir AF (DESCOVY) 200-25 MG tablet Take 1 tablet by mouth daily. 12/25/18  Yes Carlyle Basques, MD  ondansetron (ZOFRAN) 8 MG tablet TAKE ONE TABLET BY MOUTH EVERY 8 HOURS AS NEEDED FOR NAUSEA OR VOMITING Patient not taking: Reported on 02/27/2019 11/18/18   Carlyle Basques, MD  valACYclovir (VALTREX) 1000 MG tablet TAKE 1 TABLET(1000 MG) BY MOUTH DAILY Patient not taking: Reported on 02/27/2019 12/25/17   Carlyle Basques, MD    Family History Family History  Problem Relation Age of Onset   Breast cancer Neg Hx     Social History Social History   Tobacco Use   Smoking status: Former Smoker    Types: Cigarettes   Smokeless tobacco: Never Used  Substance Use Topics   Alcohol use: Yes    Alcohol/week: 0.0 standard  drinks    Comment: hx of alcohol abuse- social drinker now   Drug use: Yes    Types: Marijuana    Comment: none in 8 yrs     Allergies   Chocolate and Tomato   Review of Systems Review of Systems  Constitutional: Negative for chills, diaphoresis and fever.  Respiratory: Negative for shortness of breath.   Cardiovascular: Negative for chest pain.  Gastrointestinal: Negative for abdominal pain, diarrhea, nausea and vomiting.  Genitourinary: Positive for vaginal bleeding. Negative for dysuria, flank pain and frequency.  Musculoskeletal: Negative for back pain.  All other systems reviewed and are negative.    Physical Exam Updated Vital Signs BP (!) 144/94 (BP Location: Right Arm)    Pulse 92    Temp 98.7 F (37.1 C) (Oral)    Resp 18    SpO2 99%   Physical Exam Vitals signs and nursing note  reviewed.  Constitutional:      General: She is not in acute distress.    Appearance: She is well-developed. She is not diaphoretic.  HENT:     Head: Normocephalic and atraumatic.     Mouth/Throat:     Mouth: Mucous membranes are moist.     Pharynx: Oropharynx is clear.  Eyes:     Conjunctiva/sclera: Conjunctivae normal.  Neck:     Musculoskeletal: Neck supple.  Cardiovascular:     Rate and Rhythm: Normal rate and regular rhythm.     Pulses: Normal pulses.          Radial pulses are 2+ on the right side and 2+ on the left side.       Posterior tibial pulses are 2+ on the right side and 2+ on the left side.     Heart sounds: Normal heart sounds.     Comments: Tactile temperature in the extremities appropriate and equal bilaterally. Pulmonary:     Effort: Pulmonary effort is normal. No respiratory distress.     Breath sounds: Normal breath sounds.  Abdominal:     Palpations: Abdomen is soft.     Tenderness: There is no abdominal tenderness. There is no guarding.  Genitourinary:    Comments: External genitalia normal Vagina with scant clotted blood seeming to originate from the cervical os. Cervix  normal negative for cervical motion tenderness Adnexa palpated, no masses, negative for tenderness noted Bladder palpated negative for tenderness Uterus palpated no masses, negative for tenderness  No inguinal lymphadenopathy. Otherwise normal female genitalia. RN, Judson Roch, served as chaperone during exam. Musculoskeletal:     Right lower leg: No edema.     Left lower leg: No edema.  Lymphadenopathy:     Cervical: No cervical adenopathy.  Skin:    General: Skin is warm and dry.  Neurological:     Mental Status: She is alert.  Psychiatric:        Mood and Affect: Mood and affect normal.        Speech: Speech normal.        Behavior: Behavior normal.      ED Treatments / Results  Labs (all labs ordered are listed, but only abnormal results are displayed) Labs Reviewed    WET PREP, GENITAL - Abnormal; Notable for the following components:      Result Value   WBC, Wet Prep HPF POC MANY (*)    All other components within normal limits  COMPREHENSIVE METABOLIC PANEL - Abnormal; Notable for the following components:   Calcium 8.6 (*)    Total Bilirubin  0.1 (*)    All other components within normal limits  CBC WITH DIFFERENTIAL/PLATELET - Abnormal; Notable for the following components:   WBC 3.4 (*)    Hemoglobin 10.8 (*)    HCT 34.6 (*)    RDW 17.7 (*)    Neutro Abs 1.6 (*)    All other components within normal limits  PREGNANCY, URINE  I-STAT BETA HCG BLOOD, ED ([facility], [facility], [facility] ONLY)  GC/CHLAMYDIA PROBE AMP ([place]) NOT AT [facility]   Hemoglobin  Date Value Ref Range Status  03/07/2019 10.8 (L) 12.0 - 15.0 g/dL Final  02/04/2019 11.6 (L) 11.7 - 15.5 g/dL Final  07/29/2018 7.6 (L) 11.7 - 15.5 g/dL Final  05/06/2018 7.4 (L) 11.7 - 15.5 g/dL Final    EKG None  Radiology US Pelvis Transvaginal Non-ob (tv Only)  Result Date: 03/07/2019 CLINICAL DATA:  Pelvic pain.  Vaginal bleeding. EXAM: TRANSABDOMINAL AND TRANSVAGINAL ULTRASOUND OF PELVIS DOPPLER ULTRASOUND OF OVARIES TECHNIQUE: Both transabdominal and transvaginal ultrasound examinations of the pelvis were performed. Transabdominal technique was performed for global imaging of the pelvis including uterus, ovaries, adnexal regions, and pelvic cul-de-sac. It was necessary to proceed with endovaginal exam following the transabdominal exam to visualize the . Color and duplex Doppler ultrasound was utilized to evaluate blood flow to the ovaries. COMPARISON:  CT report 12/07/2010.  Ultrasound 04/07/2009. FINDINGS: Uterus Measurements: 8.3 x 5.4 x 6.2 cm = volume: 143.7 mL. Multiple fibroids some of which are calcified again noted. The largest measures 4.3 cm in maximum diameter. Endometrium Thickness: Endometrial stripe is not identified due to prominent fibroid abutting the endometrial canal. Right ovary  Measurements: 3.3 x 2.5 x 3.6 cm = volume: 17.4 mL. 3.0 x 2.3 x 1.9 cm simple cyst. Left ovary Measurements: 2.2 x 1.2 x 1.6 cm = volume: 2.4 mL. Normal appearance/no adnexal mass. Pulsed Doppler evaluation of both ovaries demonstrates bilateral color flow. Other findings No abnormal free fluid. IMPRESSION: 1. Multiple uterine fibroids, some of which are calcified. The largest measures 4.3 cm in maximum diameter. Endometrial stripe is not identified due to prominent fibroid abutting the endometrial canal. 2. 3.0 cm simple cyst right ovary. Bilateral color flow noted to both ovaries, no evidence of torsion. Electronically Signed   By: Marcello Moores  Register   On: 03/07/2019 06:09   US Pelvis (transabdominal Only)  Result Date: 03/07/2019 CLINICAL DATA:  Pelvic pain.  Vaginal bleeding. EXAM: TRANSABDOMINAL AND TRANSVAGINAL ULTRASOUND OF PELVIS DOPPLER ULTRASOUND OF OVARIES TECHNIQUE: Both transabdominal and transvaginal ultrasound examinations of the pelvis were performed. Transabdominal technique was performed for global imaging of the pelvis including uterus, ovaries, adnexal regions, and pelvic cul-de-sac. It was necessary to proceed with endovaginal exam following the transabdominal exam to visualize the . Color and duplex Doppler ultrasound was utilized to evaluate blood flow to the ovaries. COMPARISON:  CT report 12/07/2010.  Ultrasound 04/07/2009. FINDINGS: Uterus Measurements: 8.3 x 5.4 x 6.2 cm = volume: 143.7 mL. Multiple fibroids some of which are calcified again noted. The largest measures 4.3 cm in maximum diameter. Endometrium Thickness: Endometrial stripe is not identified due to prominent fibroid abutting the endometrial canal. Right ovary Measurements: 3.3 x 2.5 x 3.6 cm = volume: 17.4 mL. 3.0 x 2.3 x 1.9 cm simple cyst. Left ovary Measurements: 2.2 x 1.2 x 1.6 cm = volume: 2.4 mL. Normal appearance/no adnexal mass. Pulsed Doppler evaluation of both ovaries demonstrates bilateral color flow. Other  findings No abnormal free fluid. IMPRESSION: 1. Multiple uterine fibroids, some of which are  calcified. The largest measures 4.3 cm in maximum diameter. Endometrial stripe is not identified due to prominent fibroid abutting the endometrial canal. 2. 3.0 cm simple cyst right ovary. Bilateral color flow noted to both ovaries, no evidence of torsion. Electronically Signed   By: Marcello Moores  Register   On: 03/07/2019 06:09    Procedures Procedures (including critical care time)  Medications Ordered in ED Medications - No data to display   Initial Impression / Assessment and Plan / ED Course  I have reviewed the triage vital signs and the nursing notes.  Pertinent labs & imaging results that were available during my care of the patient were reviewed by me and considered in my medical decision making (see chart for details).        Patient presents with vaginal bleeding. Patient is nontoxic appearing, afebrile, not tachycardic, not tachypneic, not hypotensive, maintains excellent SPO2 on room air, and is in no apparent distress.  Lab results reassuring.  Pregnancy test negative.  No emergent hemorrhage noted on exam.  Uterine fibroids noted on ultrasound.  OB/GYN follow-up.  Return precautions discussed.  Patient voices understanding of these instructions, accepts the plan, and is comfortable with discharge.  Vitals:   03/07/19 0209 03/07/19 0550  BP: (!) 144/94 (!) 142/91  Pulse: 92 79  Resp: 18 17  Temp: 98.7 F (37.1 C)   TempSrc: Oral   SpO2: 99% 99%     Final Clinical Impressions(s) / ED Diagnoses   Final diagnoses:  Vaginal bleeding  Uterine leiomyoma, unspecified location    ED Discharge Orders    None       Layla Maw 03/07/19 0654    Orpah Greek, MD 03/07/19 2333

## 2019-03-07 NOTE — ED Notes (Signed)
US at bedside

## 2019-03-07 NOTE — ED Notes (Signed)
Pt verbalized discharge instructions and follow up care. Alert and ambulatory. No IV.  

## 2019-03-07 NOTE — Discharge Instructions (Addendum)
There was evidence of fibroids in the uterus on the ultrasound.  Follow-up with OB/GYN on this matter. Should symptoms worsen, especially if you develop severe abdominal pain, heavy bleeding using more than 1 pad an hour, you develop a fever with the bleeding, you start passing out, or any other major concerns, proceed to the emergency department at the [facility] [place].

## 2019-03-07 NOTE — ED Notes (Signed)
Pelvic cart materials at bedside

## 2019-03-07 NOTE — ED Triage Notes (Signed)
Pt arrived reporting vaginal bleeding since 1100 yesterday. Pt states she has had abdominal cramping and passed large blood clots. Pt has had a tubal ligation in the past.

## 2019-03-10 ENCOUNTER — Other Ambulatory Visit: Payer: Self-pay | Admitting: Internal Medicine

## 2019-03-10 ENCOUNTER — Telehealth: Payer: Self-pay

## 2019-03-10 ENCOUNTER — Telehealth: Payer: Self-pay | Admitting: General Practice

## 2019-03-10 DIAGNOSIS — B20 Human immunodeficiency virus [HIV] disease: Secondary | ICD-10-CM

## 2019-03-10 LAB — GC/CHLAMYDIA PROBE AMP (~~LOC~~) NOT AT ARMC
Chlamydia: NEGATIVE
Neisseria Gonorrhea: NEGATIVE

## 2019-03-10 NOTE — Telephone Encounter (Signed)
Patient called and left message on nurse voicemail line stating she needs to make an appt for her fibroids. Called patient and she states she already has an appt scheduled for 8/13. Patient had no questions.

## 2019-03-10 NOTE — Telephone Encounter (Signed)
Patient is calling for a referral to [facility] for problems with fibroids. Patient is medicaid and we are not her primary care provider. Advised her to look on her card the the provider name and call the office. She was also informed of the walk in clinic as a possibility.    Laverle Patter, RN

## 2019-03-24 ENCOUNTER — Ambulatory Visit: Payer: Medicaid Other | Admitting: Internal Medicine

## 2019-03-24 ENCOUNTER — Encounter: Payer: Self-pay | Admitting: Internal Medicine

## 2019-03-24 VITALS — BP 140/92 | HR 95 | Temp 98.7°F

## 2019-03-24 DIAGNOSIS — E041 Nontoxic single thyroid nodule: Secondary | ICD-10-CM

## 2019-03-24 DIAGNOSIS — B20 Human immunodeficiency virus [HIV] disease: Secondary | ICD-10-CM

## 2019-03-24 DIAGNOSIS — Z79899 Other long term (current) drug therapy: Secondary | ICD-10-CM | POA: Diagnosis not present

## 2019-03-24 NOTE — Progress Notes (Signed)
RFV: follow up for hiv disease  Patient ID: Dawn [name], female   DOB: [birth year], 48 y.o.   MRN: [id number]  HPI Dawn [name] is a 48yo F with hiv disease, CD 4 count of 420/VL<20 in June 2020, on descovy-DRVc.  Has been doing well with her adherence to her medication. Since we last saw her, she has Noticed based of neck swelling. Has occasional dysphagia. No covid contacts. Wears masks socially distances as well.  Outpatient Encounter Medications as of 03/24/2019  Medication Sig  . DESCOVY 200-25 MG tablet TAKE ONE TABLET BY MOUTH DAILY  . PREZCOBIX 800-150 MG tablet TAKE ONE TABLET BY MOUTH DAILY WITH BREAKFAST. SWALLOW WHOLE. DO NOT CRUSH, BREAK, OR CHEW TABLETS.  Marland Kitchen ondansetron (ZOFRAN) 8 MG tablet TAKE ONE TABLET BY MOUTH EVERY 8 HOURS AS NEEDED FOR NAUSEA OR VOMITING (Patient not taking: Reported on 02/27/2019)  . valACYclovir (VALTREX) 1000 MG tablet TAKE 1 TABLET(1000 MG) BY MOUTH DAILY (Patient not taking: Reported on 02/27/2019)   No facility-administered encounter medications on file as of 03/24/2019.      Patient Active Problem List   Diagnosis Date Noted  . Viral illness 01/08/2019  . HIV disease ([place]) 03/05/2017  . HTN (hypertension) 03/05/2017  . Anemia due to chronic blood loss 03/13/2016  . Acquired immunodeficiency syndrome ([place]) 12/07/2015  . Acne vulgaris 12/07/2015  . Episodic mood disorder ([place]) 12/07/2015   Social History   Tobacco Use  . Smoking status: Former Smoker    Types: Cigarettes  . Smokeless tobacco: Never Used  Substance Use Topics  . Alcohol use: Yes    Alcohol/week: 0.0 standard drinks    Comment: hx of alcohol abuse- social drinker now  . Drug use: Yes    Types: Marijuana    Comment: none in 8 yrs    There are no preventive care reminders to display for this patient.   Review of Systems 12 point ros is negative except what is mentioned above Physical Exam   BP (!) 140/92   Pulse 95   Temp 98.7 F (37.1 C) (Oral)   LMP 03/03/2019  (Approximate)  Physical Exam  Constitutional:  oriented to person, place, and time. appears well-developed and well-nourished. No distress.  HENT: [place]/AT, PERRLA, no scleral icterus Mouth/Throat: Oropharynx is clear and moist. No oropharyngeal exudate.  Cardiovascular: Normal rate, regular rhythm and normal heart sounds. Exam reveals no gallop and no friction rub.  No murmur heard.  Pulmonary/Chest: Effort normal and breath sounds normal. No respiratory distress.  has no wheezes.  Neck = supple, +mass/thyroimegaly Abdominal: Soft. Bowel sounds are normal.  exhibits no distension. There is no tenderness.  Lymphadenopathy: no cervical adenopathy. No axillary adenopathy Neurological: alert and oriented to person, place, and time.  Skin: Skin is warm and dry. No rash noted. No erythema.  Psychiatric: a normal mood and affect.  behavior is normal.    Lab Results  Component Value Date   CD4TCELL 40 02/04/2019   Lab Results  Component Value Date   CD4TABS 420 02/04/2019   CD4TABS 440 07/29/2018   CD4TABS 390 (L) 05/06/2018   Lab Results  Component Value Date   HIV1RNAQUANT <20 DETECTED (A) 02/04/2019   Lab Results  Component Value Date   HEPBSAB POS (A) 12/09/2015   Lab Results  Component Value Date   LABRPR NON-REACTIVE 05/06/2018    CBC Lab Results  Component Value Date   WBC 3.4 (L) 03/07/2019   RBC 3.91 03/07/2019   HGB 10.8 (L) 03/07/2019  HCT 34.6 (L) 03/07/2019   PLT 262 03/07/2019   MCV 88.5 03/07/2019   MCH 27.6 03/07/2019   MCHC 31.2 03/07/2019   RDW 17.7 (H) 03/07/2019   LYMPHSABS 1.3 03/07/2019   MONOABS 0.4 03/07/2019   EOSABS 0.0 03/07/2019    BMET Lab Results  Component Value Date   NA 138 03/07/2019   K 3.5 03/07/2019   CL 106 03/07/2019   CO2 24 03/07/2019   GLUCOSE 95 03/07/2019   BUN 9 03/07/2019   CREATININE 0.88 03/07/2019   CALCIUM 8.6 (L) 03/07/2019   GFRNONAA >60 03/07/2019   GFRAA >60 03/07/2019      Assessment and Plan  thyroidmegaly = will check thyroid studies plus U/S to see if nodules +/- needs FNA  hiv disease= well controlled, continue on current regimen. Will give refills  Long term medication management = cr is stable, no need to change regimen  Health maintenance = will check flu vaccine in the fall

## 2019-03-25 LAB — THYROID PANEL WITH TSH
Free Thyroxine Index: 1.9 (ref 1.4–3.8)
T3 Uptake: 32 % (ref 22–35)
T4, Total: 6 ug/dL (ref 5.1–11.9)
TSH: 0.55 mIU/L

## 2019-03-27 ENCOUNTER — Telehealth: Payer: Self-pay | Admitting: General Practice

## 2019-03-27 NOTE — Telephone Encounter (Signed)
Patient called into front office asking about her appt on 8/13. Told patient it is a follow up appt from her last visit in October in which a biopsy was discussed. Patient states she also discussed surgery and would that be done. Told patient no, surgery would be scheduled for a different date but Dr Rosana Hoes would talk to her about that. Patient asked if she needs medication for the procedure and if she will be knocked out. Told patient no she does not need medication and explained step by step process of endo bx to patient. She verbalized understanding and asked if she would be able to drive herself home. Told patient yes she would. Patient asked about surgery again and I told her she would have to talk to Dr Rosana Hoes about that at her next appt for additional questions. Patient verbalized understanding & had no questions.

## 2019-03-28 ENCOUNTER — Ambulatory Visit (HOSPITAL_COMMUNITY)
Admission: RE | Admit: 2019-03-28 | Discharge: 2019-03-28 | Disposition: A | Discharge status: Home / Self Care | Payer: Medicaid Other | Source: Ambulatory Visit | Attending: Internal Medicine | Admitting: Internal Medicine

## 2019-03-28 ENCOUNTER — Other Ambulatory Visit: Payer: Self-pay

## 2019-03-28 DIAGNOSIS — E041 Nontoxic single thyroid nodule: Secondary | ICD-10-CM | POA: Diagnosis not present

## 2019-04-01 ENCOUNTER — Other Ambulatory Visit: Payer: Self-pay

## 2019-04-01 ENCOUNTER — Ambulatory Visit: Payer: Medicaid Other | Admitting: Licensed Clinical Social Worker

## 2019-04-09 ENCOUNTER — Telehealth: Payer: Self-pay | Admitting: Obstetrics and Gynecology

## 2019-04-09 NOTE — Telephone Encounter (Signed)
Left a message for her to call the office, and to make sure she has the app downloaded for her visit.

## 2019-04-10 ENCOUNTER — Encounter: Payer: Self-pay | Admitting: Obstetrics and Gynecology

## 2019-04-10 ENCOUNTER — Telehealth (INDEPENDENT_AMBULATORY_CARE_PROVIDER_SITE_OTHER): Payer: Medicaid Other | Admitting: Obstetrics and Gynecology

## 2019-04-10 ENCOUNTER — Other Ambulatory Visit: Payer: Self-pay

## 2019-04-10 DIAGNOSIS — R300 Dysuria: Secondary | ICD-10-CM

## 2019-04-10 DIAGNOSIS — N939 Abnormal uterine and vaginal bleeding, unspecified: Secondary | ICD-10-CM | POA: Diagnosis not present

## 2019-04-10 DIAGNOSIS — D259 Leiomyoma of uterus, unspecified: Secondary | ICD-10-CM | POA: Diagnosis not present

## 2019-04-10 DIAGNOSIS — N920 Excessive and frequent menstruation with regular cycle: Secondary | ICD-10-CM

## 2019-04-10 NOTE — Progress Notes (Signed)
TELEHEALTH VIRTUAL GYNECOLOGY VISIT ENCOUNTER NOTE  I connected with [employer] on 04/10/19 at 10:55 AM EDT by telephone at home and verified that I am speaking with the correct person using two identifiers.   I discussed the limitations, risks, security and privacy concerns of performing an evaluation and management service by telephone and the availability of in person appointments. I also discussed with the patient that there may be a patient responsible charge related to this service. The patient expressed understanding and agreed to proceed.   History:  Dawn [name] is a 48 y.o. [phone] female being evaluated today for abnormal uterine bleeding. Last seen about a year ago, recommended for EMB and IUD, with no follow up. Reports her period is lasting up to 14 days now, sometimes less. Happening every month. Patient reports she is bleeding in between periods as well. Also having clots "the size of chicken livers" come out as well. Also feels like she is constantly going to the bathroom, has dysuria, has lower pelvic pain. Feels like "uterus is on top of my bladder." Has not seen a doctor for her urinary issues. Reports she has had a "scope" but she is not sure what happened.    States this has been happening for "a long time." States she has been reluctant to get help because of insurance reasons, and she has been in and out of shelters and has a hard time getting transportation to the office as well.     Past Medical History:  Diagnosis Date  . Anemia   . Anxiety   . Fibroid   . Headache(784.0)   . HIV (human immunodeficiency virus infection) ([place])   . Infection    UTI   Past Surgical History:  Procedure Laterality Date  . CESAREAN SECTION    . DILATION AND CURETTAGE OF UTERUS    . HAND TENDON SURGERY     right thumb  . INDUCED ABORTION    . TUBAL LIGATION     The following portions of the patient's history were reviewed and updated as appropriate: allergies, current  medications, past family history, past medical history, past social history, past surgical history and problem list.   Health Maintenance:  Normal pap on 02/2017.  Normal mammogram on [place] 1.   Review of Systems:  Pertinent items noted in HPI and remainder of comprehensive ROS otherwise negative.  Physical Exam:   General:  Alert, oriented and cooperative.   Mental Status: Normal mood and affect perceived. Normal judgment and thought content.  Physical exam deferred due to nature of the encounter  Labs and Imaging No results found for this or any previous visit (from the past 336 hour(s)).  Assessment and Plan:     1. Abnormal uterine bleeding (AUB) Reports she has not followed up due to fear, as well as insurance and transportation issues but now ready to get her bleeding under control. Recommended EMB and IUD as first steps, need to rule out malignancy and will discuss hormonal options for controlling bleeding. She is agreeable, will make appt with office  2. Uterine leiomyoma, unspecified location See above  3. Menorrhagia with regular cycle See above  4. Dysuria Urine culture    I discussed the assessment and treatment plan with the patient. The patient was provided an opportunity to ask questions and all were answered. The patient agreed with the plan and demonstrated an understanding of the instructions.   The patient was advised to call back or seek an in-person  evaluation/go to the ED if the symptoms worsen or if the condition fails to improve as anticipated.  I provided 20 minutes of non-face-to-face time during this encounter.   Sloan Leiter, MD [facility] [place] [place]

## 2019-04-21 ENCOUNTER — Telehealth: Payer: Self-pay

## 2019-04-21 NOTE — Telephone Encounter (Signed)
Patient called office today requesting prescription to help with tooth ache. Patient states she tooth has been hurting for 6 days. States that tooth ache feels itchy and swollen. Patient has tried ibuprofen and tylenol, but has had no relief. Patient would like something sent into [facility] today if possible. Is currently scheduled to see Dental clinic on 9/1. [place]

## 2019-04-21 NOTE — Telephone Encounter (Signed)
Patient left another message in triage, asking for pain medication or something today.  RN called her back, asked her to consider her primary care/urgent care/ed for this, as Dr Sisler Flattery is not in the office today and, per [facility], she needs to have this seen today.  Patient upset, stated that she thought she was supposed to call here.  Patient does have insurance coverage, does not have primary care. She stated "you just don't want to see me" and hung up the phone before RN could explain. Landis Gandy, RN

## 2019-04-22 ENCOUNTER — Ambulatory Visit (HOSPITAL_COMMUNITY)
Admission: EM | Admit: 2019-04-22 | Discharge: 2019-04-22 | Disposition: A | Discharge status: Home / Self Care | Payer: Medicaid Other | Attending: Family Medicine | Admitting: Family Medicine

## 2019-04-22 ENCOUNTER — Encounter (HOSPITAL_COMMUNITY): Payer: Self-pay

## 2019-04-22 ENCOUNTER — Other Ambulatory Visit: Payer: Self-pay

## 2019-04-22 DIAGNOSIS — K0889 Other specified disorders of teeth and supporting structures: Secondary | ICD-10-CM

## 2019-04-22 MED ORDER — HYDROCODONE-ACETAMINOPHEN 5-325 MG PO TABS: 1.0000 | ORAL_TABLET | Freq: Four times a day (QID) | ORAL | 0 refills | Status: DC | PRN

## 2019-04-22 MED ORDER — PENICILLIN V POTASSIUM 500 MG PO TABS: 500.0000 mg | ORAL_TABLET | Freq: Three times a day (TID) | ORAL | 0 refills | Status: DC

## 2019-04-22 NOTE — Discharge Instructions (Signed)
Be aware, pain medications may cause drowsiness. Please do not drive, operate heavy machinery or make important decisions while on this medication, it can cloud your judgement. Keep your scheduled dental follow up.

## 2019-04-22 NOTE — Telephone Encounter (Signed)
Can she come in tomorrow so that we can assess her

## 2019-04-22 NOTE — ED Provider Notes (Signed)
[place]   [id number] 04/22/19 Arrival Time: 0919  ASSESSMENT & PLAN:  1. Pain, dental     No sign of abscess requiring I&D at this time. Discussed.  Meds ordered this encounter  Medications  . penicillin v potassium (VEETID) 500 MG tablet    Sig: Take 1 tablet (500 mg total) by mouth 3 (three) times daily.    Dispense:  30 tablet    Refill:  0  . HYDROcodone-acetaminophen (NORCO/VICODIN) 5-325 MG tablet    Sig: Take 1 tablet by mouth every 6 (six) hours as needed for moderate pain or severe pain.    Dispense:  8 tablet    Refill:  0   [place] Controlled Substances Registry consulted for this patient. I feel the risk/benefit ratio today is favorable for proceeding with this prescription for a controlled substance. Medication sedation precautions given.   Discharge Instructions     Be aware, pain medications may cause drowsiness. Please do not drive, operate heavy machinery or make important decisions while on this medication, it can cloud your judgement. Keep your scheduled dental follow up.    Reviewed expectations re: course of current medical issues. Questions answered. Outlined signs and symptoms indicating need for more acute intervention. Patient verbalized understanding. After Visit Summary given.   SUBJECTIVE:  Dawn [name] is a 48 y.o. female with HIV who reports gradual onset of right upper dental pain described as 'throbbing and aching'. Present for 5-6 days. Fever: absent. Tolerating PO intake but reports pain with chewing. Normal swallowing. She does not see a dentist regularly but has an appt to see one within the next 1-2 weeks. No neck swelling or pain. OTC analgesics without relief.  ROS: As per HPI. All other systems negative.   OBJECTIVE: Vitals:   04/22/19 1011  BP: 133/88  Pulse: 84  Resp: 18  Temp: 98 F (36.7 C)  TempSrc: Oral  SpO2: 98%    General appearance: alert; no distress HENT: normocephalic; atraumatic; dentition:  fair; right upper gums without areas of fluctuance, drainage, or bleeding and with tenderness to palpation; normal jaw movement without difficulty Neck: supple without LAD; FROM; trachea midline CV: RRR Lungs: normal respirations; unlabored Skin: warm and dry Ext: no edema Psychological: alert and cooperative; normal mood and affect  Allergies  Allergen Reactions  . Chocolate Hives  . Tomato Hives    Past Medical History:  Diagnosis Date  . Anemia   . Anxiety   . Fibroid   . Headache(784.0)   . HIV (human immunodeficiency virus infection) ([place])   . Infection    UTI   Social History   Socioeconomic History  . Marital status: Single    Spouse name: Not on file  . Number of children: Not on file  . Years of education: Not on file  . Highest education level: Not on file  Occupational History  . Not on file  Social Needs  . Financial resource strain: Not on file  . Food insecurity    Worry: Not on file    Inability: Not on file  . Transportation needs    Medical: Not on file    Non-medical: Not on file  Tobacco Use  . Smoking status: Former Smoker    Types: Cigarettes  . Smokeless tobacco: Never Used  Substance and Sexual Activity  . Alcohol use: Yes    Alcohol/week: 0.0 standard drinks    Comment: hx of alcohol abuse- social drinker now  . Drug use: Yes  Types: Marijuana    Comment: none in 8 yrs  . Sexual activity: Not Currently    Birth control/protection: Surgical    Comment: condoms given  Lifestyle  . Physical activity    Days per week: Not on file    Minutes per session: Not on file  . Stress: Not on file  Relationships  . Social Herbalist on phone: Not on file    Gets together: Not on file    Attends religious service: Not on file    Active member of club or organization: Not on file    Attends meetings of clubs or organizations: Not on file    Relationship status: Not on file  . Intimate partner violence    Fear of current or ex  partner: Not on file    Emotionally abused: Not on file    Physically abused: Not on file    Forced sexual activity: Not on file  Other Topics Concern  . Not on file  Social History Narrative  . Not on file   Family History  Problem Relation Age of Onset  . Breast cancer Neg Hx    Past Surgical History:  Procedure Laterality Date  . CESAREAN SECTION    . DILATION AND CURETTAGE OF UTERUS    . HAND TENDON SURGERY     right thumb  . INDUCED ABORTION    . TUBAL LIGATION       Vanessa Kick, MD 04/22/19 1053

## 2019-04-22 NOTE — ED Triage Notes (Signed)
Pt presents with right side dental abscess X 6 days with no relief with home remedies and OTC medication.

## 2019-05-14 ENCOUNTER — Other Ambulatory Visit (HOSPITAL_COMMUNITY)
Admission: RE | Admit: 2019-05-14 | Discharge: 2019-05-14 | Disposition: A | Discharge status: Home / Self Care | Payer: Medicaid Other | Source: Ambulatory Visit | Attending: Obstetrics and Gynecology | Admitting: Obstetrics and Gynecology

## 2019-05-14 ENCOUNTER — Other Ambulatory Visit: Payer: Self-pay

## 2019-05-14 ENCOUNTER — Encounter: Payer: Self-pay | Admitting: Obstetrics and Gynecology

## 2019-05-14 ENCOUNTER — Ambulatory Visit: Payer: Medicaid Other | Admitting: Clinical

## 2019-05-14 ENCOUNTER — Ambulatory Visit (INDEPENDENT_AMBULATORY_CARE_PROVIDER_SITE_OTHER): Payer: Medicaid Other | Admitting: Obstetrics and Gynecology

## 2019-05-14 VITALS — BP 169/96 | HR 90 | Wt 158.7 lb

## 2019-05-14 DIAGNOSIS — Z1331 Encounter for screening for depression: Secondary | ICD-10-CM | POA: Diagnosis not present

## 2019-05-14 DIAGNOSIS — Z124 Encounter for screening for malignant neoplasm of cervix: Secondary | ICD-10-CM

## 2019-05-14 DIAGNOSIS — Z3202 Encounter for pregnancy test, result negative: Secondary | ICD-10-CM

## 2019-05-14 DIAGNOSIS — D259 Leiomyoma of uterus, unspecified: Secondary | ICD-10-CM | POA: Diagnosis not present

## 2019-05-14 DIAGNOSIS — N920 Excessive and frequent menstruation with regular cycle: Secondary | ICD-10-CM

## 2019-05-14 DIAGNOSIS — N939 Abnormal uterine and vaginal bleeding, unspecified: Secondary | ICD-10-CM

## 2019-05-14 DIAGNOSIS — F39 Unspecified mood [affective] disorder: Secondary | ICD-10-CM

## 2019-05-14 LAB — POCT PREGNANCY, URINE: Preg Test, Ur: NEGATIVE

## 2019-05-14 MED ORDER — MEDROXYPROGESTERONE ACETATE 10 MG PO TABS: 20.0000 mg | ORAL_TABLET | Freq: Every day | ORAL | 2 refills | Status: DC

## 2019-05-14 NOTE — Addendum Note (Signed)
Addended by: Michel Harrow on: 05/14/2019 11:46 AM   Modules accepted: Orders

## 2019-05-14 NOTE — Addendum Note (Signed)
Addended by: Michel Harrow on: 05/14/2019 11:50 AM   Modules accepted: Orders

## 2019-05-14 NOTE — Progress Notes (Signed)
GYNECOLOGY OFFICE FOLLOW UP NOTE  History:  48 y.o. [id number] here today for follow up for AUB and menorrhagia. She reports the last two periods she has had, she has had a few days of spotting and then 2-3 days of normal period. Has not been heavy like it was before. Also with less pain. Overall, she is doing well.     Past Medical History:  Diagnosis Date  . Anemia   . Anxiety   . Fibroid   . Headache(784.0)   . HIV (human immunodeficiency virus infection) ([place])   . Infection    UTI    Past Surgical History:  Procedure Laterality Date  . CESAREAN SECTION    . DILATION AND CURETTAGE OF UTERUS    . HAND TENDON SURGERY     right thumb  . INDUCED ABORTION    . TUBAL LIGATION       Current Outpatient Medications:  .  DESCOVY 200-25 MG tablet, TAKE ONE TABLET BY MOUTH DAILY, Disp: 30 tablet, Rfl: 1 .  PREZCOBIX 800-150 MG tablet, TAKE ONE TABLET BY MOUTH DAILY WITH BREAKFAST. SWALLOW WHOLE. DO NOT CRUSH, BREAK, OR CHEW TABLETS., Disp: 30 tablet, Rfl: 1 .  HYDROcodone-acetaminophen (NORCO/VICODIN) 5-325 MG tablet, Take 1 tablet by mouth every 6 (six) hours as needed for moderate pain or severe pain. (Patient not taking: Reported on 05/14/2019), Disp: 8 tablet, Rfl: 0 .  medroxyPROGESTERone (PROVERA) 10 MG tablet, Take 2 tablets (20 mg total) by mouth daily., Disp: 30 tablet, Rfl: 2 .  ondansetron (ZOFRAN) 8 MG tablet, TAKE ONE TABLET BY MOUTH EVERY 8 HOURS AS NEEDED FOR NAUSEA OR VOMITING (Patient not taking: Reported on 02/27/2019), Disp: 30 tablet, Rfl: 0 .  penicillin v potassium (VEETID) 500 MG tablet, Take 1 tablet (500 mg total) by mouth 3 (three) times daily. (Patient not taking: Reported on 05/14/2019), Disp: 30 tablet, Rfl: 0 .  valACYclovir (VALTREX) 1000 MG tablet, TAKE 1 TABLET(1000 MG) BY MOUTH DAILY (Patient not taking: Reported on 02/27/2019), Disp: 30 tablet, Rfl: 5  The following portions of the patient's history were reviewed and updated as appropriate: allergies, current  medications, past family history, past medical history, past social history, past surgical history and problem list.   Review of Systems:  Pertinent items noted in HPI and remainder of comprehensive ROS otherwise negative.   Objective:  Physical Exam BP (!) 169/96   Pulse 90   Wt 158 lb 11.2 oz (72 kg)   BMI 24.86 kg/m  CONSTITUTIONAL: Well-developed, well-nourished female in no acute distress.  HENT:  Normocephalic, atraumatic. External right and left ear normal. Oropharynx is clear and moist EYES: Conjunctivae and EOM are normal. Pupils are equal, round, and reactive to light. No scleral icterus.  NECK: Normal range of motion, supple, no masses SKIN: Skin is warm and dry. No rash noted. Not diaphoretic. No erythema. No pallor. NEUROLOGIC: Alert and oriented to person, place, and time. Normal reflexes, muscle tone coordination. No cranial nerve deficit noted. PSYCHIATRIC: Normal mood and affect. Normal behavior. Normal judgment and thought content. CARDIOVASCULAR: Normal heart rate noted RESPIRATORY: Effort normal, no problems with respiration noted ABDOMEN: Soft, no distention noted.   PELVIC: Normal appearing external genitalia; normal appearing vaginal mucosa and cervix.  No abnormal discharge noted.  Pap smear obtained.  Mildly enlarged uterine size, no other palpable masses, no uterine or adnexal tenderness. MUSCULOSKELETAL: Normal range of motion. No edema noted.  Exam done with chaperone present.  Labs and Imaging No results found.  Assessment & Plan:  1. Abnormal uterine bleeding (AUB) Recommended for EMB, completed today, please see separate procedure note - would not recommend IUD placement today due to 5 cm fibroid abutting endometrial lining, appears to obscure lining, recommend she start on PO medication, she is agreeable to this - Provera sent to pharmacy - Surgical pathology( [place]) - of note, no issues with EMB, I think she would be a reasonable  candidate for IUD placement in office if fails PO meds, she verbalizes understanding of this  2. Uterine leiomyoma, unspecified location See above  3. Menorrhagia with regular cycle See above   Routine preventative health maintenance measures emphasized. Please refer to After Visit Summary for other counseling recommendations.   Return in about 3 months (around 08/13/2019) for Followup, in person.  Total face-to-face time with patient: 15 minutes. Over 50% of encounter was spent on counseling and coordination of care.  Feliz Beam, M.D. Attending [facility] for [employer])

## 2019-05-14 NOTE — Patient Instructions (Signed)
Behavioral Health Resources:   What if I or someone I know is in crisis?  . If you are thinking about harming yourself or having thoughts of suicide, or if you know someone who is, seek help right away.  . Call your doctor or mental health care provider.  . Call 911 or go to a hospital emergency room to get immediate help, or ask a friend or family member to help you do these things.  . Call the [place] National Suicide Prevention Lifeline's toll-free, 24-hour hotline at 1-800-273-TALK 504-264-5270) or TTY: 1-800-799-4 TTY 818-597-6593) to talk to a trained counselor.  . If you are in crisis, make sure you are not left alone.   . If someone else is in crisis, make sure he or she is not left alone   24 Hour :   [place] National Suicide Hotline: 518 593 2799  [facility] Mobile Crisis: [phone]   [facility]  [address] [place], [place] [address]  [phone] or [phone]  [facility] [employer] (Domestic Violence, Rape & Victim Assistance)  [phone]  [place]  [address], [place]  [address]   [phone] or [phone]   [place]: [phone] (8am-4pm) or [phone] (after hours)

## 2019-05-14 NOTE — BH Specialist Note (Signed)
Integrated Behavioral Health Initial Visit  MRN: [id number] Name: Dawn [name]  Number of [place] Clinician visits:: 1/6 Session Start time: 11:39  Session End time: 12:04 Total time: 20 minutes  Type of Service: [place] Interpretor:No. Interpretor Name and Language: n/a   Warm Hand Off Completed.       SUBJECTIVE: Dawn [name] is a 48 y.o. female accompanied by n/a Patient was referred by Dawn Rota, MD for positive depression screen. Patient reports the following symptoms/concerns: Pt states she has had thoughts of dying off and on for years, copes by relying on her faith in God; denies current SI, denies intent or plan. Pt agrees to a referral to psychiatry for "my nerves", and agrees to have [facility] inform her medical provider, Dawn Basques, MD, at [facility].  Duration of problem: Ongoing; Severity of problem: severe  OBJECTIVE: Mood: Anxious and Affect: Appropriate Risk of harm to self or others: No plan to harm self or others No current SI; no intent or plan  LIFE CONTEXT: Family and Social: Pt lives with friends School/Work: - Self-Care: - Life Changes: -  GOALS ADDRESSED: Patient will: 1. Reduce symptoms of: mood instability 2. Demonstrate ability to: Increase motivation to adhere to plan of care  INTERVENTIONS: Interventions utilized: Motivational Interviewing and Supportive Counseling  Standardized Assessments completed: GAD-7, PHQ 9 and PHQ 2&9 with C-SSRS  ASSESSMENT: Patient currently experiencing Mood disorder, as previously diagnosed   Patient may benefit from psychoeducation and brief therapeutic interventions regarding coping with symptoms of mood instability .  PLAN: 1. Follow up with behavioral health clinician on : One week 2. Behavioral recommendations:  -Accept referral to psychiatry -Follow safety plan, as discussed, if SI returns 3. Referral(s): [facility]  (In Clinic) and Psychiatrist 4. "From scale of 1-10, how likely are you to follow plan?": -  Dawn Fair, LCSW  Depression screen [facility] 2/9 05/14/2019 03/24/2019 01/08/2019 08/12/2018 06/12/2018  Decreased Interest 0 0 0 1 0  Down, Depressed, Hopeless 1 1 0 1 1  PHQ - 2 Score 1 1 0 2 1  Altered sleeping 2 - - 1 3  Tired, decreased energy 2 - - 1 3  Change in appetite 0 - - 0 0  Feeling bad or failure about yourself  2 - - 0 0  Trouble concentrating 0 - - 1 0  Moving slowly or fidgety/restless 0 - - 0 0  Suicidal thoughts 2 - - 0 0  PHQ-9 Score 9 - - 5 7  Difficult doing work/chores - - - - -   GAD 7 : Generalized Anxiety Score 05/14/2019 06/12/2018  Nervous, Anxious, on Edge 2 1  Control/stop worrying 2 2  Worry too much - different things 2 3  Trouble relaxing 1 1  Restless 2 0  Easily annoyed or irritable 0 1  Afraid - awful might happen 2 1  Total GAD 7 Score 11 9

## 2019-05-14 NOTE — Progress Notes (Signed)
ENDOMETRIAL BIOPSY      Dawn [name] is a 48 y.o. [id number] here for endometrial biopsy.  The indications for endometrial biopsy were reviewed.  Risks of the biopsy including cramping, bleeding, infection, uterine perforation, inadequate specimen and need for additional procedures were discussed. The patient states she understands and agrees to undergo procedure today. Consent was signed. Time out was performed.   Indications: AUB Urine HCG: negative  A bivalve speculum was placed into the vagina and the cervix was easily visualized and was prepped with Betadine x2. A single-toothed tenaculum was placed on the anterior lip of the cervix to stabilize it. The 3 mm pipelle was introduced into the endometrial cavity without difficulty to a depth of 9 cm, and a moderate amount of tissue was obtained and sent to pathology. This was repeated for a total of 3 passes. The instruments were removed from the patient's vagina. Minimal bleeding from the cervix at the tenaculum was noted.   The patient tolerated the procedure well. Routine post-procedure instructions were given to the patient.    Will base further management on results of biopsy.  Feliz Beam, M.D. Attending Center for [employer] manager)

## 2019-05-15 ENCOUNTER — Telehealth: Payer: Self-pay | Admitting: Obstetrics and Gynecology

## 2019-05-15 NOTE — Telephone Encounter (Signed)
The patient called in stating she would like some one to call her to explain the medication Dr. Rosana Hoes prescribed for her. She stated she is not going to take anything without knowing what it is.

## 2019-05-16 LAB — SURGICAL PATHOLOGY

## 2019-05-18 LAB — CYTOLOGY - PAP
Diagnosis: NEGATIVE
High risk HPV: NEGATIVE
Molecular Disclaimer: 56
Molecular Disclaimer: DETECTED
Molecular Disclaimer: NORMAL

## 2019-05-19 ENCOUNTER — Ambulatory Visit: Payer: Medicaid Other | Admitting: Clinical

## 2019-05-19 ENCOUNTER — Encounter: Payer: Self-pay | Admitting: Clinical

## 2019-05-19 ENCOUNTER — Other Ambulatory Visit: Payer: Self-pay

## 2019-05-19 DIAGNOSIS — Z91199 Patient's noncompliance with other medical treatment and regimen due to unspecified reason: Secondary | ICD-10-CM

## 2019-05-19 DIAGNOSIS — Z5329 Procedure and treatment not carried out because of patient's decision for other reasons: Secondary | ICD-10-CM

## 2019-05-19 NOTE — BH Specialist Note (Signed)
Pt did not arrive to video visit; unable to leave voice message as [phone] "cannot be reached at this time" and [phone] is "not in service". Left MyChart message for patient.   Integrated Behavioral Health via Telemedicine Video Visit  05/19/2019 Dawn [name] [id number]  [name]

## 2019-05-20 NOTE — Telephone Encounter (Signed)
Attempted to return patient's phone call. Neither of the numbers on file were working. Sent a my chart message.

## 2019-05-22 ENCOUNTER — Other Ambulatory Visit: Payer: Self-pay | Admitting: Internal Medicine

## 2019-05-22 DIAGNOSIS — B20 Human immunodeficiency virus [HIV] disease: Secondary | ICD-10-CM

## 2019-06-27 ENCOUNTER — Encounter: Payer: Self-pay | Admitting: *Deleted

## 2019-06-30 ENCOUNTER — Ambulatory Visit: Payer: Medicaid Other | Admitting: Internal Medicine

## 2019-06-30 ENCOUNTER — Other Ambulatory Visit: Payer: Self-pay | Admitting: Internal Medicine

## 2019-06-30 ENCOUNTER — Other Ambulatory Visit: Payer: Self-pay | Admitting: Obstetrics and Gynecology

## 2019-06-30 DIAGNOSIS — B20 Human immunodeficiency virus [HIV] disease: Secondary | ICD-10-CM

## 2019-07-02 ENCOUNTER — Other Ambulatory Visit: Payer: Self-pay

## 2019-07-02 ENCOUNTER — Encounter: Payer: Self-pay | Admitting: Internal Medicine

## 2019-07-02 ENCOUNTER — Other Ambulatory Visit (HOSPITAL_COMMUNITY)
Admission: RE | Admit: 2019-07-02 | Discharge: 2019-07-02 | Disposition: A | Discharge status: Home / Self Care | Payer: Medicaid Other | Source: Ambulatory Visit | Attending: Internal Medicine | Admitting: Internal Medicine

## 2019-07-02 ENCOUNTER — Ambulatory Visit (INDEPENDENT_AMBULATORY_CARE_PROVIDER_SITE_OTHER): Payer: Medicaid Other | Admitting: Internal Medicine

## 2019-07-02 ENCOUNTER — Telehealth: Payer: Self-pay

## 2019-07-02 VITALS — BP 144/88 | HR 78 | Temp 98.8°F | Wt 162.0 lb

## 2019-07-02 DIAGNOSIS — Z789 Other specified health status: Secondary | ICD-10-CM

## 2019-07-02 DIAGNOSIS — B20 Human immunodeficiency virus [HIV] disease: Secondary | ICD-10-CM | POA: Insufficient documentation

## 2019-07-02 DIAGNOSIS — F331 Major depressive disorder, recurrent, moderate: Secondary | ICD-10-CM | POA: Diagnosis not present

## 2019-07-02 DIAGNOSIS — Z23 Encounter for immunization: Secondary | ICD-10-CM | POA: Diagnosis not present

## 2019-07-02 NOTE — Progress Notes (Signed)
RFV: follow up for hiv disease  Patient ID: Dawn [name], female   DOB: 1971-05-05, 48 y.o.   MRN: [id number]  HPI 48yo F with hiv disease, on descovy-prezcobix, CD 4 count of 420/VL<20 in April 2020.no meds 2 days but didn't receive it yet.  She wears her mask but she is concerned that no one is wearing mask at her workplace. She is thinking about quiting.  Outpatient Encounter Medications as of 07/02/2019  Medication Sig  . DESCOVY 200-25 MG tablet TAKE ONE TABLET BY MOUTH DAILY  . HYDROcodone-acetaminophen (NORCO/VICODIN) 5-325 MG tablet Take 1 tablet by mouth every 6 (six) hours as needed for moderate pain or severe pain. (Patient not taking: Reported on 05/14/2019)  . medroxyPROGESTERone (PROVERA) 10 MG tablet TAKE TWO TABLETS BY MOUTH DAILY  . ondansetron (ZOFRAN) 8 MG tablet TAKE ONE TABLET BY MOUTH EVERY 8 HOURS AS NEEDED FOR NAUSEA OR VOMITING (Patient not taking: Reported on 02/27/2019)  . penicillin v potassium (VEETID) 500 MG tablet Take 1 tablet (500 mg total) by mouth 3 (three) times daily. (Patient not taking: Reported on 05/14/2019)  . PREZCOBIX 800-150 MG tablet TAKE ONE TABLET BY MOUTH DAILY WITH BREAKFAST. SWALLOW WHOLE. DO NOT CRUSH, BREAK, OR CHEW TABLETS.  . valACYclovir (VALTREX) 1000 MG tablet TAKE 1 TABLET(1000 MG) BY MOUTH DAILY (Patient not taking: Reported on 02/27/2019)   No facility-administered encounter medications on file as of 07/02/2019.      Patient Active Problem List   Diagnosis Date Noted  . Viral illness 01/08/2019  . HIV disease ([place]) 03/05/2017  . HTN (hypertension) 03/05/2017  . Anemia due to chronic blood loss 03/13/2016  . Acquired immunodeficiency syndrome ([place]) 12/07/2015  . Acne vulgaris 12/07/2015  . Episodic mood disorder ([place]) 12/07/2015     Health Maintenance Due  Topic Date Due  . INFLUENZA VACCINE  03/29/2019    Social History   Tobacco Use  . Smoking status: Former Smoker    Types: Cigarettes  . Smokeless tobacco: Never  Used  Substance Use Topics  . Alcohol use: Not Currently    Alcohol/week: 0.0 standard drinks    Comment: hx of alcohol abuse- social drinker now  . Drug use: Yes    Types: Marijuana    Comment: none in 8 yrs    Review of Systems Review of Systems  Constitutional: Negative for fever, chills, diaphoresis, activity change, appetite change, fatigue and unexpected weight change.  HENT: Negative for congestion, sore throat, rhinorrhea, sneezing, trouble swallowing and sinus pressure.  Eyes: Negative for photophobia and visual disturbance.  Respiratory: Negative for cough, chest tightness, shortness of breath, wheezing and stridor.  Cardiovascular: Negative for chest pain, palpitations and leg swelling.  Gastrointestinal: Negative for nausea, vomiting, abdominal pain, diarrhea, constipation, blood in stool, abdominal distention and anal bleeding.  Genitourinary: Negative for dysuria, hematuria, flank pain and difficulty urinating.  Musculoskeletal: Negative for myalgias, back pain, joint swelling, arthralgias and gait problem.  Skin: Negative for color change, pallor, rash and wound.  Neurological: Negative for dizziness, tremors, weakness and light-headedness.  Hematological: Negative for adenopathy. Does not bruise/bleed easily.  Psychiatric/Behavioral: Negative for behavioral problems, confusion, sleep disturbance, dysphoric mood, decreased concentration and agitation.    Physical Exam   BP (!) 144/88   Pulse 78   Temp 98.8 F (37.1 C)   Wt 162 lb (73.5 kg)   BMI 25.37 kg/m   Physical Exam  Constitutional:  oriented to person, place, and time. appears well-developed and well-nourished. No distress.  HENT: [place]/AT, PERRLA, no scleral icterus Mouth/Throat: Oropharynx is clear and moist. No oropharyngeal exudate.  Cardiovascular: Normal rate, regular rhythm and normal heart sounds. Exam reveals no gallop and no friction rub.  No murmur heard.  Pulmonary/Chest: Effort normal and  breath sounds normal. No respiratory distress.  has no wheezes.  Neck = supple, no nuchal rigidity Abdominal: Soft. Bowel sounds are normal.  exhibits no distension. There is no tenderness.  Lymphadenopathy: no cervical adenopathy. No axillary adenopathy Neurological: alert and oriented to person, place, and time.  Skin: Skin is warm and dry. No rash noted. No erythema.  Psychiatric: a normal mood and affect.  behavior is normal.   Lab Results  Component Value Date   CD4TCELL 40 02/04/2019   Lab Results  Component Value Date   CD4TABS 420 02/04/2019   CD4TABS 440 07/29/2018   CD4TABS 390 (L) 05/06/2018   Lab Results  Component Value Date   HIV1RNAQUANT <20 DETECTED (A) 02/04/2019   Lab Results  Component Value Date   HEPBSAB POS (A) 12/09/2015   Lab Results  Component Value Date   LABRPR NON-REACTIVE 05/06/2018    CBC Lab Results  Component Value Date   WBC 3.4 (L) 03/07/2019   RBC 3.91 03/07/2019   HGB 10.8 (L) 03/07/2019   HCT 34.6 (L) 03/07/2019   PLT 262 03/07/2019   MCV 88.5 03/07/2019   MCH 27.6 03/07/2019   MCHC 31.2 03/07/2019   RDW 17.7 (H) 03/07/2019   LYMPHSABS 1.3 03/07/2019   MONOABS 0.4 03/07/2019   EOSABS 0.0 03/07/2019    BMET Lab Results  Component Value Date   NA 138 03/07/2019   K 3.5 03/07/2019   CL 106 03/07/2019   CO2 24 03/07/2019   GLUCOSE 95 03/07/2019   BUN 9 03/07/2019   CREATININE 0.88 03/07/2019   CALCIUM 8.6 (L) 03/07/2019   GFRNONAA >60 03/07/2019   GFRAA >60 03/07/2019      Assessment and Plan  hiv disease= will refill meds to see what happens to figure out why she didn't get her meds at start  Health maintenance = will give flu shot; needs to schedule mammo  prehtn = continue to monitor

## 2019-07-02 NOTE — Telephone Encounter (Signed)
Patient reported previous difficulty with medication refills. [place] to confirm the refills have been received and that the patient will receive them today. Pharmacist confirmed that she will get them today and they had no issues filling.   Hridhaan Yohn Lorita Officer, RN

## 2019-07-03 LAB — URINE CYTOLOGY ANCILLARY ONLY
Chlamydia: NEGATIVE
Comment: NEGATIVE
Comment: NORMAL
Neisseria Gonorrhea: NEGATIVE

## 2019-07-03 LAB — T-HELPER CELL (CD4) - (RCID CLINIC ONLY)
CD4 % Helper T Cell: 41 % (ref 33–65)
CD4 T Cell Abs: 447 /uL (ref 400–1790)

## 2019-07-06 LAB — CBC WITH DIFFERENTIAL/PLATELET
Absolute Monocytes: 301 cells/uL (ref 200–950)
Basophils Absolute: 19 cells/uL (ref 0–200)
Basophils Relative: 0.6 %
Eosinophils Absolute: 31 cells/uL (ref 15–500)
Eosinophils Relative: 1 %
HCT: 34.2 % — ABNORMAL LOW (ref 35.0–45.0)
Hemoglobin: 10.5 g/dL — ABNORMAL LOW (ref 11.7–15.5)
Lymphs Abs: 1249 cells/uL (ref 850–3900)
MCH: 25.2 pg — ABNORMAL LOW (ref 27.0–33.0)
MCHC: 30.7 g/dL — ABNORMAL LOW (ref 32.0–36.0)
MCV: 82 fL (ref 80.0–100.0)
MPV: 10.9 fL (ref 7.5–12.5)
Monocytes Relative: 9.7 %
Neutro Abs: 1500 cells/uL (ref 1500–7800)
Neutrophils Relative %: 48.4 %
Platelets: 310 10*3/uL (ref 140–400)
RBC: 4.17 10*6/uL (ref 3.80–5.10)
RDW: 15.3 % — ABNORMAL HIGH (ref 11.0–15.0)
Total Lymphocyte: 40.3 %
WBC: 3.1 10*3/uL — ABNORMAL LOW (ref 3.8–10.8)

## 2019-07-06 LAB — COMPLETE METABOLIC PANEL WITH GFR
AG Ratio: 1.1 (calc) (ref 1.0–2.5)
ALT: 8 U/L (ref 6–29)
AST: 14 U/L (ref 10–35)
Albumin: 4.2 g/dL (ref 3.6–5.1)
Alkaline phosphatase (APISO): 53 U/L (ref 31–125)
BUN: 10 mg/dL (ref 7–25)
CO2: 25 mmol/L (ref 20–32)
Calcium: 9.4 mg/dL (ref 8.6–10.2)
Chloride: 105 mmol/L (ref 98–110)
Creat: 0.9 mg/dL (ref 0.50–1.10)
GFR, Est African American: 89 mL/min/{1.73_m2} (ref >=60)
GFR, Est Non African American: 77 mL/min/{1.73_m2} (ref >=60)
Globulin: 3.7 g/dL (calc) (ref 1.9–3.7)
Glucose, Bld: 87 mg/dL (ref 65–99)
Potassium: 4.1 mmol/L (ref 3.5–5.3)
Sodium: 139 mmol/L (ref 135–146)
Total Bilirubin: 0.4 mg/dL (ref 0.2–1.2)
Total Protein: 7.9 g/dL (ref 6.1–8.1)

## 2019-07-06 LAB — RPR: RPR Ser Ql: NONREACTIVE

## 2019-07-06 LAB — HIV-1 RNA QUANT-NO REFLEX-BLD
HIV 1 RNA Quant: <20 copies/mL
HIV-1 RNA Quant, Log: <1.3 Log copies/mL

## 2019-07-06 LAB — LIPID PANEL
Cholesterol: 213 mg/dL — ABNORMAL HIGH (ref <200)
HDL: 50 mg/dL (ref >=50)
LDL Cholesterol (Calc): 147 mg/dL (calc) — ABNORMAL HIGH
Non-HDL Cholesterol (Calc): 163 mg/dL (calc) — ABNORMAL HIGH (ref <130)
Total CHOL/HDL Ratio: 4.3 (calc) (ref <5.0)
Triglycerides: 61 mg/dL (ref <150)

## 2019-07-30 ENCOUNTER — Telehealth: Payer: Self-pay | Admitting: *Deleted

## 2019-07-30 NOTE — Telephone Encounter (Signed)
Patient called to report that she has had a boil on her buttock for about 1 week that has drained but is still hard and painful. She would like to have an antibiotic to help it heal. Advised her will ask the provider and give her a call back once they respond.

## 2019-08-06 ENCOUNTER — Other Ambulatory Visit: Payer: Self-pay | Admitting: Obstetrics and Gynecology

## 2019-08-19 ENCOUNTER — Telehealth: Payer: Self-pay

## 2019-08-19 DIAGNOSIS — B009 Herpesviral infection, unspecified: Secondary | ICD-10-CM

## 2019-08-19 MED ORDER — VALACYCLOVIR HCL 1 G PO TABS: ORAL_TABLET | ORAL | 5 refills | Status: DC

## 2019-08-19 NOTE — Telephone Encounter (Signed)
Patient called report she has an ongoing boil on her buttocks for now 3 weeks. It has been draining but it continues to hard and painful. Patient requesting antibiotic.  Advised patient to seek care at Urgent Care but patient states "it's too cold and I do not want to get on the bus and do all of that."  LPN advised patient that we will contact provider and call her back once we get a response. Patient also requested refill on valtrex. Medication sent to pharmacy.  Eugenia Mcalpine

## 2019-08-20 ENCOUNTER — Other Ambulatory Visit: Payer: Self-pay

## 2019-08-20 ENCOUNTER — Ambulatory Visit
Admission: RE | Admit: 2019-08-20 | Discharge: 2019-08-20 | Disposition: A | Discharge status: Home / Self Care | Payer: Medicaid Other | Source: Ambulatory Visit | Attending: Internal Medicine | Admitting: Internal Medicine

## 2019-08-20 DIAGNOSIS — Z789 Other specified health status: Secondary | ICD-10-CM

## 2019-08-25 ENCOUNTER — Other Ambulatory Visit: Payer: Self-pay | Admitting: Internal Medicine

## 2019-08-25 DIAGNOSIS — R928 Other abnormal and inconclusive findings on diagnostic imaging of breast: Secondary | ICD-10-CM

## 2019-09-02 ENCOUNTER — Other Ambulatory Visit: Payer: Self-pay

## 2019-09-02 ENCOUNTER — Ambulatory Visit (HOSPITAL_COMMUNITY)
Admission: EM | Admit: 2019-09-02 | Discharge: 2019-09-02 | Disposition: A | Discharge status: Home / Self Care | Payer: Medicaid Other | Attending: Physician Assistant | Admitting: Physician Assistant

## 2019-09-02 ENCOUNTER — Encounter (HOSPITAL_COMMUNITY): Payer: Self-pay

## 2019-09-02 DIAGNOSIS — L0291 Cutaneous abscess, unspecified: Secondary | ICD-10-CM

## 2019-09-02 MED ORDER — AMOXICILLIN-POT CLAVULANATE 875-125 MG PO TABS: 1.0000 | ORAL_TABLET | Freq: Two times a day (BID) | ORAL | 0 refills | Status: AC

## 2019-09-02 MED ORDER — DOXYCYCLINE HYCLATE 100 MG PO CAPS: 100.0000 mg | ORAL_CAPSULE | Freq: Two times a day (BID) | ORAL | 0 refills | Status: AC

## 2019-09-02 MED ORDER — LIDOCAINE-EPINEPHRINE (PF) 2 %-1:200000 IJ SOLN
INTRAMUSCULAR | Status: AC
  Filled 2019-09-02: qty 20

## 2019-09-02 NOTE — Discharge Instructions (Addendum)
I have sent in 2 antibiotics.  Take them both 2 times a day for 10 days.  If you are not improving in 48 hours, please return to have the wound re-evaluated.   Keep the area as clean as you can. Keep it dry for the next 24 hours.   Utilize water proof bandaids if possible, but keep the area covered at all times until the area has healed.  You may elect to perform "sitz" baths, I have attached information on this.   If you develop fever, worsening chills, notice the area growing larger or much more painful please be re-evaluated at this Urgent care or at the Emergency Department.

## 2019-09-02 NOTE — ED Provider Notes (Signed)
[facility]    CSN: [id number] Arrival date & time: 09/02/19  1418      History   Chief Complaint Chief Complaint  Patient presents with  . Abscess    HPI Dawn [name] is a 49 y.o. female.   Patient reports to urgent care today for 1-2 week history of abscess on left buttock. She reports it has been draining yellow discharge. She notes it has been painful as well. She endorses chills but denies a measured fever. She has not had an abscess in this location before. She has tried draining it some her self.   Of note Dawn [name] is an HIV/AIDs patient and has routine care and compliant with her anti-retroviral regiment.      Past Medical History:  Diagnosis Date  . Anemia   . Anxiety   . Fibroid   . Headache(784.0)   . HIV (human immunodeficiency virus infection) ([place])   . Infection    UTI    Patient Active Problem List   Diagnosis Date Noted  . Viral illness 01/08/2019  . HIV disease ([place]) 03/05/2017  . HTN (hypertension) 03/05/2017  . Anemia due to chronic blood loss 03/13/2016  . Acquired immunodeficiency syndrome ([place]) 12/07/2015  . Acne vulgaris 12/07/2015  . Episodic mood disorder ([place]) 12/07/2015    Past Surgical History:  Procedure Laterality Date  . CESAREAN SECTION    . DILATION AND CURETTAGE OF UTERUS    . HAND TENDON SURGERY     right thumb  . INDUCED ABORTION    . TUBAL LIGATION      OB History    Gravida  6   Para  4   Term  4   Preterm      AB  2   Living  4     SAB  1   TAB  1   Ectopic      Multiple      Live Births  4            Home Medications    Prior to Admission medications   Medication Sig Start Date End Date Taking? Authorizing Provider  DESCOVY 200-25 MG tablet TAKE ONE TABLET BY MOUTH DAILY 06/30/19  Yes Carlyle Basques, MD  valACYclovir (VALTREX) 1000 MG tablet TAKE 1 TABLET(1000 MG) BY MOUTH DAILY 08/19/19  Yes Carlyle Basques, MD  amoxicillin-clavulanate (AUGMENTIN) 875-125 MG tablet Take  1 tablet by mouth every 12 (twelve) hours for 10 days. 09/02/19 09/12/19  Nesta Kimple, Marguerita Beards, PA-C  doxycycline (VIBRAMYCIN) 100 MG capsule Take 1 capsule (100 mg total) by mouth 2 (two) times daily for 10 days. 09/02/19 09/12/19  Manahil Vanzile, Marguerita Beards, PA-C  HYDROcodone-acetaminophen (NORCO/VICODIN) 5-325 MG tablet Take 1 tablet by mouth every 6 (six) hours as needed for moderate pain or severe pain. Patient not taking: Reported on 05/14/2019 04/22/19   Vanessa Kick, MD  medroxyPROGESTERone (PROVERA) 10 MG tablet TAKE TWO TABLETS BY MOUTH DAILY 08/07/19   Sloan Leiter, MD  ondansetron (ZOFRAN) 8 MG tablet TAKE ONE TABLET BY MOUTH EVERY 8 HOURS AS NEEDED FOR NAUSEA OR VOMITING Patient not taking: Reported on 02/27/2019 11/18/18   Carlyle Basques, MD  penicillin v potassium (VEETID) 500 MG tablet Take 1 tablet (500 mg total) by mouth 3 (three) times daily. Patient not taking: Reported on 05/14/2019 04/22/19   Vanessa Kick, MD  PREZCOBIX 800-150 MG tablet TAKE ONE TABLET BY MOUTH DAILY WITH BREAKFAST. SWALLOW WHOLE. DO NOT CRUSH, BREAK, OR CHEW TABLETS. 06/30/19  Carlyle Basques, MD    Family History Family History  Problem Relation Age of Onset  . Breast cancer Neg Hx     Social History Social History   Tobacco Use  . Smoking status: Former Smoker    Types: Cigarettes  . Smokeless tobacco: Never Used  Substance Use Topics  . Alcohol use: Yes    Comment: hx of alcohol abuse- social drinker now  . Drug use: Yes    Types: Marijuana    Comment: none in 8 yrs     Allergies   Chocolate and Tomato   Review of Systems Review of Systems  Constitutional: Positive for chills. Negative for fever.  Respiratory: Negative for cough and shortness of breath.   Cardiovascular: Negative for chest pain.  Gastrointestinal: Negative for abdominal pain and anal bleeding.  Skin: Positive for color change and wound.  Allergic/Immunologic: Positive for immunocompromised state.  Hematological: Negative for adenopathy.  Does not bruise/bleed easily.     Physical Exam Triage Vital Signs ED Triage Vitals  Enc Vitals Group     BP 09/02/19 1642 (!) 153/103     Pulse Rate 09/02/19 1642 85     Resp 09/02/19 1642 18     Temp 09/02/19 1642 98.3 F (36.8 C)     Temp Source 09/02/19 1642 Oral     SpO2 09/02/19 1642 98 %     Weight --      Height --      Head Circumference --      Peak Flow --      Pain Score 09/02/19 1637 2     Pain Loc --      Pain Edu? --      Excl. in [place]? --    No data found.  Updated Vital Signs BP (!) 153/103 (BP Location: Left Arm)   Pulse 85   Temp 98.3 F (36.8 C) (Oral)   Resp 18   SpO2 98%   Visual Acuity Right Eye Distance:   Left Eye Distance:   Bilateral Distance:    Right Eye Near:   Left Eye Near:    Bilateral Near:     Physical Exam Vitals and nursing note reviewed.  Constitutional:      General: She is not in acute distress.    Appearance: She is well-developed. She is not ill-appearing.  HENT:     Head: Normocephalic and atraumatic.  Eyes:     Conjunctiva/sclera: Conjunctivae normal.     Pupils: Pupils are equal, round, and reactive to light.  Cardiovascular:     Rate and Rhythm: Normal rate.  Pulmonary:     Effort: Pulmonary effort is normal. No respiratory distress.  Musculoskeletal:     Cervical back: Neck supple.  Skin:    General: Skin is warm and dry.     Comments: 2cmx2cm abscess with purulent drainage on inferior and medial margin or right glute. Actively draining purulent discharge. Induration on the inferior margin of abscess.   Neurological:     General: No focal deficit present.     Mental Status: She is alert and oriented to person, place, and time.  Psychiatric:        Mood and Affect: Mood normal.        Behavior: Behavior normal.      UC Treatments / Results  Labs (all labs ordered are listed, but only abnormal results are displayed) Labs Reviewed - No data to display  EKG   Radiology No results  found.  Procedures Incision and Drainage  Date/Time: 09/02/2019 5:50 PM Performed by: Purnell Shoemaker, PA-C Authorized by: Purnell Shoemaker, PA-C   Consent:    Consent obtained:  Verbal   Consent given by:  Patient   Risks discussed:  Bleeding, incomplete drainage and pain   Alternatives discussed:  No treatment, delayed treatment and alternative treatment Location:    Type:  Abscess   Location:  Lower extremity   Lower extremity location:  Buttock   Buttock location:  L buttock Pre-procedure details:    Skin preparation:  Betadine Anesthesia (see MAR for exact dosages):    Anesthesia method:  Local infiltration   Local anesthetic:  Lidocaine 2% WITH epi Procedure type:    Complexity:  Simple Procedure details:    Needle aspiration: no     Incision types:  Single straight   Incision depth:  Dermal   Scalpel blade:  11   Wound management:  Probed and deloculated and irrigated with saline   Drainage:  Bloody and serosanguinous   Drainage amount:  Scant   Wound treatment:  Wound left open   Packing materials:  None Post-procedure details:    Patient tolerance of procedure:  Tolerated well, no immediate complications   (including critical care time)  Medications Ordered in UC Medications - No data to display  Initial Impression / Assessment and Plan / UC Course  I have reviewed the triage vital signs and the nursing notes.  Pertinent labs & imaging results that were available during my care of the patient were reviewed by me and considered in my medical decision making (see chart for details).     # abscess - I & D performed. No packing. Wound covered with gauze and tegaderm. Sent augmentin and doxycycline for staph, strep and MRSA coverage given immunocompromised state. Recommended sitz baths starting after 48 hours. Return precautions discussed.    Final Clinical Impressions(s) / UC Diagnoses   Final diagnoses:  Abscess     Discharge Instructions     I have sent  in 2 antibiotics.  Take them both 2 times a day for 10 days.  If you are not improving in 48 hours, please return to have the wound re-evaluated.   Keep the area as clean as you can. Keep it dry for the next 24 hours.   Utilize water proof bandaids if possible, but keep the area covered at all times until the area has healed.  You may elect to perform "sitz" baths, I have attached information on this.   If you develop fever, worsening chills, notice the area growing larger or much more painful please be re-evaluated at this Urgent care or at the Emergency Department.     ED Prescriptions    Medication Sig Dispense Auth. Provider   doxycycline (VIBRAMYCIN) 100 MG capsule Take 1 capsule (100 mg total) by mouth 2 (two) times daily for 10 days. 20 capsule Ajaya Crutchfield, Marguerita Beards, PA-C   amoxicillin-clavulanate (AUGMENTIN) 875-125 MG tablet Take 1 tablet by mouth every 12 (twelve) hours for 10 days. 20 tablet Fedra Lanter, Marguerita Beards, PA-C     PDMP not reviewed this encounter.   Purnell Shoemaker, PA-C 09/02/19 2101

## 2019-09-02 NOTE — ED Notes (Signed)
Placed bacitracin, telfa non-stick dressing and topped with tegaderm on abscess site per [facility]

## 2019-09-02 NOTE — ED Triage Notes (Signed)
Pt c/o abscess to left buttocks. Draining small amount of yellow discharge. Has had mild chills.

## 2019-09-18 ENCOUNTER — Telehealth: Payer: Self-pay

## 2019-09-18 NOTE — Telephone Encounter (Signed)
Pt called to inquire about treatment for her acne, hyperpigmentation, sun spots, etc. Advised her we have IPL/Fraxil Laser.  She wanted to know if we treat patients with HIV. She also has a friend who has HIV and other medical conditions and she wanted to know if she could be treated as well. She mentioned that she is seeing someone for Infectious Disease. Please call her and leave a message on her vm if she doesn't answer to advise If this is possible.

## 2019-09-19 NOTE — Telephone Encounter (Signed)
Called and [facility] @ 4:20pm) informing the patient that I will give her a call back on Monday regarding her message below.//AB/CMA

## 2019-09-23 NOTE — Telephone Encounter (Signed)
Called and spoke with the patient and informed her that per Dr. Marla Roe if it's the acne she's mainly concern about she may need to see a Dermatologist.  She feels the Laser may cause Hypopigmentation and the area may turn white or blister.  Patient verbalized understanding and agreed.  She appreciate been informed of this before she maked an appointment.//AB/CMA

## 2019-09-29 ENCOUNTER — Other Ambulatory Visit: Payer: Medicaid Other

## 2019-09-29 ENCOUNTER — Inpatient Hospital Stay: Admission: RE | Admit: 2019-09-29 | Payer: Medicaid Other | Source: Ambulatory Visit

## 2019-10-03 ENCOUNTER — Other Ambulatory Visit: Payer: Self-pay | Admitting: Internal Medicine

## 2019-10-09 ENCOUNTER — Other Ambulatory Visit: Payer: Self-pay | Admitting: Internal Medicine

## 2019-10-09 ENCOUNTER — Ambulatory Visit
Admission: RE | Admit: 2019-10-09 | Discharge: 2019-10-09 | Disposition: A | Discharge status: Home / Self Care | Payer: Medicaid Other | Source: Ambulatory Visit | Attending: Internal Medicine | Admitting: Internal Medicine

## 2019-10-09 ENCOUNTER — Other Ambulatory Visit: Payer: Self-pay

## 2019-10-09 DIAGNOSIS — R928 Other abnormal and inconclusive findings on diagnostic imaging of breast: Secondary | ICD-10-CM

## 2019-10-09 DIAGNOSIS — N6310 Unspecified lump in the right breast, unspecified quadrant: Secondary | ICD-10-CM

## 2019-10-17 ENCOUNTER — Ambulatory Visit
Admission: RE | Admit: 2019-10-17 | Discharge: 2019-10-17 | Disposition: A | Discharge status: Home / Self Care | Payer: Medicaid Other | Source: Ambulatory Visit | Attending: Internal Medicine | Admitting: Internal Medicine

## 2019-10-17 ENCOUNTER — Other Ambulatory Visit: Payer: Self-pay

## 2019-10-17 DIAGNOSIS — N6310 Unspecified lump in the right breast, unspecified quadrant: Secondary | ICD-10-CM

## 2019-10-20 ENCOUNTER — Other Ambulatory Visit: Payer: Medicaid Other

## 2019-11-04 ENCOUNTER — Other Ambulatory Visit: Payer: Self-pay | Admitting: General Surgery

## 2019-11-04 DIAGNOSIS — E041 Nontoxic single thyroid nodule: Secondary | ICD-10-CM

## 2019-11-06 ENCOUNTER — Ambulatory Visit
Admission: RE | Admit: 2019-11-06 | Discharge: 2019-11-06 | Disposition: A | Discharge status: Home / Self Care | Payer: Medicaid Other | Source: Ambulatory Visit | Attending: General Surgery | Admitting: General Surgery

## 2019-11-06 ENCOUNTER — Other Ambulatory Visit: Payer: Self-pay

## 2019-11-06 DIAGNOSIS — E041 Nontoxic single thyroid nodule: Secondary | ICD-10-CM

## 2019-12-11 ENCOUNTER — Other Ambulatory Visit: Payer: Self-pay | Admitting: Surgery

## 2019-12-11 DIAGNOSIS — E041 Nontoxic single thyroid nodule: Secondary | ICD-10-CM

## 2019-12-13 ENCOUNTER — Other Ambulatory Visit: Payer: Self-pay | Admitting: Internal Medicine

## 2019-12-13 DIAGNOSIS — B20 Human immunodeficiency virus [HIV] disease: Secondary | ICD-10-CM

## 2019-12-18 ENCOUNTER — Ambulatory Visit
Admission: RE | Admit: 2019-12-18 | Discharge: 2019-12-18 | Disposition: A | Discharge status: Home / Self Care | Payer: Medicaid Other | Source: Ambulatory Visit | Attending: Surgery | Admitting: Surgery

## 2019-12-18 ENCOUNTER — Other Ambulatory Visit (HOSPITAL_COMMUNITY)
Admission: RE | Admit: 2019-12-18 | Discharge: 2019-12-18 | Disposition: A | Discharge status: Home / Self Care | Payer: Medicaid Other | Source: Ambulatory Visit | Attending: Surgery | Admitting: Surgery

## 2019-12-18 DIAGNOSIS — E041 Nontoxic single thyroid nodule: Secondary | ICD-10-CM | POA: Diagnosis present

## 2019-12-18 NOTE — Procedures (Signed)
PROCEDURE SUMMARY:  Using direct ultrasound guidance, 5 passes were made using 25 g needles into the nodule within the right lobe of the thyroid.   Ultrasound was used to confirm needle placements on all occasions.   EBL = trace  Specimens were sent to Pathology for analysis.  See procedure note under Imaging tab in Epic for full procedure details.  Telsa Dillavou S Paul Torpey PA-C 12/18/2019 4:20 PM

## 2019-12-19 LAB — CYTOLOGY - NON PAP

## 2020-01-20 ENCOUNTER — Encounter (HOSPITAL_COMMUNITY): Payer: Self-pay

## 2020-02-10 ENCOUNTER — Telehealth: Payer: Self-pay

## 2020-02-10 ENCOUNTER — Other Ambulatory Visit: Payer: Self-pay

## 2020-02-10 NOTE — Telephone Encounter (Signed)
Received Prescription renewal request via fax for Valacyclovir. Request approved electronically.  Dawn [name]

## 2020-02-10 NOTE — Telephone Encounter (Signed)
Patient called wanting to know results of her ultrasound of her thyroid. Doesn't report any new complaints; just wanted to be sure Dr. Sia Flattery received the results and if she could call her with them.   Dawn Frick Lorita Officer, RN

## 2020-03-09 ENCOUNTER — Other Ambulatory Visit: Payer: Self-pay

## 2020-03-09 ENCOUNTER — Ambulatory Visit: Payer: Medicaid Other | Admitting: Podiatrist

## 2020-03-09 ENCOUNTER — Encounter: Payer: Self-pay | Admitting: Podiatrist

## 2020-03-09 VITALS — BP 141/87 | HR 86 | Temp 97.5°F | Resp 16

## 2020-03-09 DIAGNOSIS — S90211A Contusion of right great toe with damage to nail, initial encounter: Secondary | ICD-10-CM | POA: Diagnosis not present

## 2020-03-09 NOTE — Progress Notes (Signed)
  Chief Complaint  Patient presents with  . Nail Problem    Bilateral; Hallux; Nail discoloration & thickened nails; pt stated, "I got a pedicure 2 months ago; after that, I got nail fungus"; x2 months     HPI: Patient is 49 y.o. female who presents today for the concerns as listed above. She relates she noticed the nail being discolored and lifted for the past 2 months.  States the right hallux is the nail of most concern. She also states she wore a tight pair of shoes a couple months ago as well.    Review of Systems No fevers, chills, nausea, muscle aches, no difficulty breathing, no calf pain, no chest pain or shortness of breath.   Physical Exam  GENERAL APPEARANCE: Alert, conversant. Appropriately groomed. No acute distress.   VASCULAR: Pedal pulses palpable DP and PT bilateral.  Capillary refill time is immediate to all digits,  Proximal to distal cooling it warm to warm.  Digital perfusion adequate.   NEUROLOGIC: sensation is intact epicritically and protectively to 5.07 monofilament at 5/5 sites bilateral.  Light touch is intact bilateral, vibratory sensation intact bilateral, achilles tendon reflex is intact bilateral.   MUSCULOSKELETAL: acceptable muscle strength, tone and stability bilateral.  No gross boney pedal deformities noted.  No pain, crepitus or limitation noted with foot and ankle range of motion bilateral.   DERMATOLOGIC: skin is warm, supple, and dry.  No open lesions noted.  No rash, no pre ulcerative lesions. Bilateral hallux nails are elongated.  Left hallux nail otherwise appears normal.  Right hallux nail has a contusion under the nail bed with a purplish discoloration of the main nail.  No pain with direct pressure.  Evidence of trauma with a new nail pushing out the old and a well demarcated delineation of trauma is noted.     Assessment   Contusion of right great toe with damage to nail, initial encounter    Plan  Discussed exam findings and  treatment options.  Recommended watching the toenail grow out and the damaged part should either grow out or will fall off.  If she has any concerns with pain or redness, swelling or drainage she will call - otherwise she will be seen back prn.

## 2020-03-12 ENCOUNTER — Other Ambulatory Visit: Payer: Self-pay

## 2020-03-12 ENCOUNTER — Encounter (HOSPITAL_COMMUNITY): Payer: Self-pay

## 2020-03-12 ENCOUNTER — Ambulatory Visit (HOSPITAL_COMMUNITY)
Admission: EM | Admit: 2020-03-12 | Discharge: 2020-03-12 | Disposition: A | Discharge status: Home / Self Care | Payer: Medicaid Other | Attending: Emergency Medicine | Admitting: Emergency Medicine

## 2020-03-12 DIAGNOSIS — H1031 Unspecified acute conjunctivitis, right eye: Secondary | ICD-10-CM | POA: Diagnosis present

## 2020-03-12 DIAGNOSIS — Z21 Asymptomatic human immunodeficiency virus [HIV] infection status: Secondary | ICD-10-CM | POA: Diagnosis not present

## 2020-03-12 DIAGNOSIS — Z87891 Personal history of nicotine dependence: Secondary | ICD-10-CM | POA: Diagnosis not present

## 2020-03-12 DIAGNOSIS — Z793 Long term (current) use of hormonal contraceptives: Secondary | ICD-10-CM | POA: Diagnosis not present

## 2020-03-12 DIAGNOSIS — Z20822 Contact with and (suspected) exposure to covid-19: Secondary | ICD-10-CM | POA: Insufficient documentation

## 2020-03-12 DIAGNOSIS — J069 Acute upper respiratory infection, unspecified: Secondary | ICD-10-CM | POA: Diagnosis not present

## 2020-03-12 LAB — SARS CORONAVIRUS 2 (TAT 6-24 HRS): SARS Coronavirus 2: NEGATIVE

## 2020-03-12 MED ORDER — TOBRAMYCIN 0.3 % OP OINT: 1.0000 "application " | TOPICAL_OINTMENT | Freq: Three times a day (TID) | OPHTHALMIC | 0 refills | Status: AC

## 2020-03-12 NOTE — Discharge Instructions (Signed)
Self isolate until covid results are back and negative.  Will notify you by phone of any positive findings. Your negative results will be sent through your MyChart.     Complete course of antibiotic ointment to right eye.  If symptoms worsen or do not improve in the next week to return to be seen or to follow up with your PCP.

## 2020-03-12 NOTE — ED Provider Notes (Signed)
[facility]    CSN: [id number] Arrival date & time: 03/12/20  1218      History   Chief Complaint Chief Complaint  Patient presents with  . Conjunctivitis    HPI Dawn [name] is a 49 y.o. female.   Dawn [name] presents with complaints of right eye redness, itching, tearing as well as nasal drainage and feeling like she "has a cold."  Started 2-3 days ago. States she was around a friend who had red eyes as well, just prior to onset of symptoms. No vision loss but feels like some blurred to vision and mild light sensitivity. No exposure to foreign bodies/ chemicals. Has been applying compresses. No fevers. No swelling to face or eye. No shortness of breath or difficulty breathing. No rash. She is HIV positive and states she takes her medications regularly, quant <20 ~ a year ago. She has not received a covid vaccine and she denies any history of covid-19.     ROS per HPI, negative if not otherwise mentioned.      Past Medical History:  Diagnosis Date  . Anemia   . Anxiety   . Fibroid   . Headache(784.0)   . HIV (human immunodeficiency virus infection) ([place])   . Infection    UTI    Patient Active Problem List   Diagnosis Date Noted  . Viral illness 01/08/2019  . HIV disease ([place]) 03/05/2017  . HTN (hypertension) 03/05/2017  . Anemia due to chronic blood loss 03/13/2016  . Acquired immunodeficiency syndrome ([place]) 12/07/2015  . Acne vulgaris 12/07/2015  . Episodic mood disorder ([place]) 12/07/2015    Past Surgical History:  Procedure Laterality Date  . CESAREAN SECTION    . DILATION AND CURETTAGE OF UTERUS    . HAND TENDON SURGERY     right thumb  . INDUCED ABORTION    . TUBAL LIGATION      OB History    Gravida  6   Para  4   Term  4   Preterm      AB  2   Living  4     SAB  1   TAB  1   Ectopic      Multiple      Live Births  4            Home Medications    Prior to Admission medications   Medication Sig Start  Date End Date Taking? Authorizing Provider  DESCOVY 200-25 MG tablet TAKE ONE TABLET BY MOUTH DAILY 12/15/19   Carlyle Basques, MD  medroxyPROGESTERone (PROVERA) 10 MG tablet TAKE TWO TABLETS BY MOUTH DAILY 08/07/19   Sloan Leiter, MD  PREZCOBIX 800-150 MG tablet TAKE ONE TABLET BY MOUTH DAILY WITH BREAKFAST. SWALLOW WHOLE. DO NOT CRUSH, BREAK, OR CHEW TABLETS. 12/15/19   Carlyle Basques, MD  tobramycin (TOBREX) 0.3 % ophthalmic ointment Place 1 application into the right eye 3 (three) times daily for 7 days. 03/12/20 03/19/20  Zigmund Gottron, NP  valACYclovir (VALTREX) 1000 MG tablet TAKE 1 TABLET(1000 MG) BY MOUTH DAILY 08/19/19   Carlyle Basques, MD    Family History Family History  Problem Relation Age of Onset  . Breast cancer Neg Hx     Social History Social History   Tobacco Use  . Smoking status: Former Smoker    Types: Cigarettes  . Smokeless tobacco: Never Used  Vaping Use  . Vaping Use: Never used  Substance Use Topics  . Alcohol use: Yes  Comment: hx of alcohol abuse- social drinker now  . Drug use: Yes    Types: Marijuana    Comment: none in 8 yrs     Allergies   Chocolate and Tomato   Review of Systems Review of Systems   Physical Exam Triage Vital Signs ED Triage Vitals  Enc Vitals Group     BP 03/12/20 1251 (!) 155/97     Pulse Rate 03/12/20 1251 85     Resp 03/12/20 1251 16     Temp 03/12/20 1251 98.5 F (36.9 C)     Temp Source 03/12/20 1251 Oral     SpO2 03/12/20 1251 97 %     Weight 03/12/20 1252 170 lb (77.1 kg)     Height 03/12/20 1252 5\' 4"  (1.626 m)     Head Circumference --      Peak Flow --      Pain Score 03/12/20 1252 6     Pain Loc --      Pain Edu? --      Excl. in [place]? --    No data found.  Updated Vital Signs BP (!) 155/97   Pulse 85   Temp 98.5 F (36.9 C) (Oral)   Resp 16   Ht 5\' 4"  (1.626 m)   Wt 170 lb (77.1 kg)   SpO2 97%   BMI 29.18 kg/m    Physical Exam Constitutional:      General: She is not in  acute distress.    Appearance: She is well-developed.  Eyes:     General: Lids are normal. Vision grossly intact.     Extraocular Movements: Extraocular movements intact.     Conjunctiva/sclera:     Right eye: Right conjunctiva is injected.     Comments: Clear tearing noted from right eye  Cardiovascular:     Rate and Rhythm: Normal rate.  Pulmonary:     Effort: Pulmonary effort is normal.  Skin:    General: Skin is warm and dry.  Neurological:     Mental Status: She is alert and oriented to person, place, and time.      UC Treatments / Results  Labs (all labs ordered are listed, but only abnormal results are displayed) Labs Reviewed  SARS CORONAVIRUS 2 (TAT 6-24 HRS)    EKG   Radiology No results found.  Procedures Procedures (including critical care time)  Medications Ordered in UC Medications - No data to display  Initial Impression / Assessment and Plan / UC Course  I have reviewed the triage vital signs and the nursing notes.  Pertinent labs & imaging results that were available during my care of the patient were reviewed by me and considered in my medical decision making (see chart for details).     Non toxic. Right eye conjunctivitis with known exposure, per patient. Will cover with antibiotics. covid testing collected and pending. Return precautions provided. Patient verbalized understanding and agreeable to plan.   Final Clinical Impressions(s) / UC Diagnoses   Final diagnoses:  Acute bacterial conjunctivitis of right eye  Acute upper respiratory infection     Discharge Instructions     Self isolate until covid results are back and negative.  Will notify you by phone of any positive findings. Your negative results will be sent through your MyChart.     Complete course of antibiotic ointment to right eye.  If symptoms worsen or do not improve in the next week to return to be seen or to follow up  with your PCP.     ED Prescriptions    Medication  Sig Dispense Auth. Provider   tobramycin (TOBREX) 0.3 % ophthalmic ointment Place 1 application into the right eye 3 (three) times daily for 7 days. 3.5 g Zigmund Gottron, NP     PDMP not reviewed this encounter.   Zigmund Gottron, NP 03/12/20 1325

## 2020-03-12 NOTE — ED Triage Notes (Signed)
Pt c/o 6/10 sharp pain in right eye, eye itching, hard to keep eye lids openx3 days. Pt states it feels like something is in it. Pt right sclera of eye is red. Pt c/o blurred vision.

## 2020-03-19 ENCOUNTER — Telehealth (HOSPITAL_COMMUNITY): Payer: Self-pay | Admitting: Emergency Medicine

## 2020-03-19 MED ORDER — ERYTHROMYCIN 5 MG/GM OP OINT: 1.0000 "application " | TOPICAL_OINTMENT | Freq: Four times a day (QID) | OPHTHALMIC | 0 refills | Status: AC

## 2020-03-19 NOTE — Telephone Encounter (Signed)
Patient called stating the prescription sent in for conjunctivitis is not covered by her insurance.  Asked [place] to send something else in for her.  APP who saw patient on site today, provided patient information, states she will review.    Verified patient's identity using two identifiers, and also provided negative COVID result.  Patient verbalized understanding.

## 2020-03-26 ENCOUNTER — Other Ambulatory Visit: Payer: Self-pay | Admitting: Internal Medicine

## 2020-03-26 DIAGNOSIS — B009 Herpesviral infection, unspecified: Secondary | ICD-10-CM

## 2020-04-07 ENCOUNTER — Ambulatory Visit: Payer: Medicaid Other | Admitting: Obstetrics and Gynecology

## 2020-05-10 ENCOUNTER — Other Ambulatory Visit: Payer: Self-pay | Admitting: Internal Medicine

## 2020-05-10 DIAGNOSIS — B20 Human immunodeficiency virus [HIV] disease: Secondary | ICD-10-CM

## 2020-05-12 ENCOUNTER — Telehealth: Payer: Self-pay

## 2020-05-12 NOTE — Telephone Encounter (Signed)
Called both numbers on patients chart to get a follow up scheduled no answer or call can not be completed at this time

## 2020-05-19 ENCOUNTER — Other Ambulatory Visit: Payer: Self-pay | Admitting: Internal Medicine

## 2020-05-19 DIAGNOSIS — B20 Human immunodeficiency virus [HIV] disease: Secondary | ICD-10-CM

## 2020-05-19 DIAGNOSIS — B009 Herpesviral infection, unspecified: Secondary | ICD-10-CM

## 2020-06-17 ENCOUNTER — Other Ambulatory Visit: Payer: Self-pay

## 2020-06-17 ENCOUNTER — Ambulatory Visit
Admission: EM | Admit: 2020-06-17 | Discharge: 2020-06-17 | Disposition: A | Discharge status: Home / Self Care | Payer: Medicaid Other | Attending: Physician Assistant | Admitting: Physician Assistant

## 2020-06-17 DIAGNOSIS — T7840XA Allergy, unspecified, initial encounter: Secondary | ICD-10-CM

## 2020-06-17 DIAGNOSIS — Z20822 Contact with and (suspected) exposure to covid-19: Secondary | ICD-10-CM | POA: Diagnosis not present

## 2020-06-17 MED ORDER — DIPHENHYDRAMINE HCL 25 MG PO TABS: 25.0000 mg | ORAL_TABLET | Freq: Four times a day (QID) | ORAL | 0 refills | Status: DC | PRN

## 2020-06-17 MED ORDER — FAMOTIDINE 20 MG PO TABS: 20.0000 mg | ORAL_TABLET | Freq: Two times a day (BID) | ORAL | 0 refills | Status: DC

## 2020-06-17 NOTE — Discharge Instructions (Signed)
Take Benadryl every six hours for the next three days.  Take Pepcid twice daily for the next three days.

## 2020-06-17 NOTE — ED Triage Notes (Signed)
Patient in with c/o congestion, productive cough, sneezing, runny nose, and ST that started yesterday after she mixed bleach, faboluso, and ammonia while cleaning dog cage.  States that she feels sob sometimes because of congestion and her head feels like its stopped up.  Patient took allergy medicine last night with no relief  Denies n/v, diarrhea, or other sxs

## 2020-06-17 NOTE — ED Provider Notes (Signed)
Emergency Department Provider Note  ____________________________________________  Time seen: Approximately 10:12 AM  I have reviewed the triage vital signs and the nursing notes.   HISTORY  Chief Complaint Sore Throat and Nasal Congestion   Historian Patient     HPI Dawn [name] is a 49 y.o. female with a history of HIV, presents to the emergency department with some nasal congestion and cough after patient was cleaning dog cages.  She states that cough and nasal congestion has persisted and she became concerned.  She denies fever and chills.  No associated diarrhea or emesis.  Patient has not attempted any alleviating medications.   Past Medical History:  Diagnosis Date  . Anemia   . Anxiety   . Fibroid   . Headache(784.0)   . HIV (human immunodeficiency virus infection) ([place])   . Infection    UTI     Immunizations up to date:  No.   Past Medical History:  Diagnosis Date  . Anemia   . Anxiety   . Fibroid   . Headache(784.0)   . HIV (human immunodeficiency virus infection) ([place])   . Infection    UTI    Patient Active Problem List   Diagnosis Date Noted  . Viral illness 01/08/2019  . HIV disease ([place]) 03/05/2017  . HTN (hypertension) 03/05/2017  . Anemia due to chronic blood loss 03/13/2016  . Acquired immunodeficiency syndrome ([place]) 12/07/2015  . Acne vulgaris 12/07/2015  . Episodic mood disorder ([place]) 12/07/2015    Past Surgical History:  Procedure Laterality Date  . CESAREAN SECTION    . DILATION AND CURETTAGE OF UTERUS    . HAND TENDON SURGERY     right thumb  . INDUCED ABORTION    . TUBAL LIGATION      Prior to Admission medications   Medication Sig Start Date End Date Taking? Authorizing Provider  DESCOVY 200-25 MG tablet TAKE ONE TABLET BY MOUTH DAILY 05/10/20  Yes Carlyle Basques, MD  PREZCOBIX 800-150 MG tablet TAKE ONE TABLET BY MOUTH DAILY WITH BREAKFAST. SWALLOW WHOLE. DO NOT CRUSH, BREAK, OR CHEW TABLETS. 05/10/20  Yes Carlyle Basques, MD  valACYclovir (VALTREX) 1000 MG tablet TAKE 1 TABLET(1000 MG) BY MOUTH DAILY 03/26/20  Yes Carlyle Basques, MD  diphenhydrAMINE (BENADRYL) 25 MG tablet Take 1 tablet (25 mg total) by mouth every 6 (six) hours as needed for up to 3 days. 06/17/20 06/20/20  Lannie Fields, PA-C  famotidine (PEPCID) 20 MG tablet Take 1 tablet (20 mg total) by mouth 2 (two) times daily for 5 days. 06/17/20 06/22/20  Lannie Fields, PA-C  medroxyPROGESTERone (PROVERA) 10 MG tablet TAKE TWO TABLETS BY MOUTH DAILY 08/07/19   Sloan Leiter, MD    Allergies Chocolate and Tomato  Family History  Problem Relation Age of Onset  . Breast cancer Neg Hx     Social History Social History   Tobacco Use  . Smoking status: Former Smoker    Types: Cigarettes  . Smokeless tobacco: Never Used  Vaping Use  . Vaping Use: Never used  Substance Use Topics  . Alcohol use: Yes    Comment: hx of alcohol abuse- social drinker now  . Drug use: Yes    Types: Marijuana    Comment: none in 8 yrs     Review of Systems  Constitutional: No fever/chills Eyes:  No discharge ENT: Patient has nasal congestion.  Respiratory: Patient has cough.  Gastrointestinal:   No nausea, no vomiting.  No diarrhea.  No constipation.  Musculoskeletal: Negative for musculoskeletal pain. Skin: Negative for rash, abrasions, lacerations, ecchymosis.    ____________________________________________   PHYSICAL EXAM:  VITAL SIGNS: ED Triage Vitals  Enc Vitals Group     BP 06/17/20 0944 140/90     Pulse Rate 06/17/20 0944 89     Resp 06/17/20 0944 20     Temp 06/17/20 0944 98.4 F (36.9 C)     Temp Source 06/17/20 0944 Oral     SpO2 06/17/20 0944 99 %     Weight --      Height --      Head Circumference --      Peak Flow --      Pain Score 06/17/20 0939 10     Pain Loc --      Pain Edu? --      Excl. in [place]? --      Constitutional: Alert and oriented. Well appearing and in no acute distress. Eyes: Conjunctivae are  normal. PERRL. EOMI. Head: Atraumatic. ENT:      Ears: TMs are pearly.       Nose: No congestion/rhinnorhea.      Mouth/Throat: Mucous membranes are moist.  Neck: No stridor.  No cervical spine tenderness to palpation. Cardiovascular: Normal rate, regular rhythm. Normal S1 and S2.  Good peripheral circulation. Respiratory: Normal respiratory effort without tachypnea or retractions. Lungs CTAB. Good air entry to the bases with no decreased or absent breath sounds Gastrointestinal: Bowel sounds x 4 quadrants. Soft and nontender to palpation. No guarding or rigidity. No distention. Musculoskeletal: Full range of motion to all extremities. No obvious deformities noted Neurologic:  Normal for age. No gross focal neurologic deficits are appreciated.  Skin:  Skin is warm, dry and intact. No rash noted. Psychiatric: Mood and affect are normal for age. Speech and behavior are normal.   ____________________________________________   LABS (all labs ordered are listed, but only abnormal results are displayed)  Labs Reviewed  NOVEL CORONAVIRUS, NAA   ____________________________________________  EKG   ____________________________________________  RADIOLOGY  No results found.  ____________________________________________    PROCEDURES  Procedure(s) performed:     Procedures     Medications - No data to display   ____________________________________________   INITIAL IMPRESSION / ASSESSMENT AND PLAN / ED COURSE  Pertinent labs & imaging results that were available during my care of the patient were reviewed by me and considered in my medical decision making (see chart for details).      Assessment and Plan:  Allergy 48 year old female presents to the urgent care after she experienced cough and nasal congestion after cleaning with a combination of bleach and ammonia.  Vital signs are reassuring at triage.  On physical exam, patient was alert, active and  nontoxic-appearing.  No adventitious lung sounds were auscultated.  Recommended Benadryl every 6 hours for the next 3 days and Pepcid once daily for the next 3 days.  Recommended follow-up with her PCP, Dr. Graylon Good if symptoms do not improve.  All patient questions were answered.  ____________________________________________  FINAL CLINICAL IMPRESSION(S) / ED DIAGNOSES  Final diagnoses:  Encounter for screening laboratory testing for COVID-19 virus  Allergy, initial encounter      NEW MEDICATIONS STARTED DURING THIS VISIT:  ED Discharge Orders         Ordered    diphenhydrAMINE (BENADRYL) 25 MG tablet  Every 6 hours PRN        06/17/20 1002    famotidine (PEPCID) 20 MG tablet  2 times daily  06/17/20 1002              This chart was dictated using voice recognition software/Dragon. Despite best efforts to proofread, errors can occur which can change the meaning. Any change was purely unintentional.     Lannie Fields, PA-C 06/17/20 1015

## 2020-06-18 LAB — NOVEL CORONAVIRUS, NAA: SARS-CoV-2, NAA: NOT DETECTED

## 2020-06-18 LAB — SARS-COV-2, NAA 2 DAY TAT

## 2020-06-28 ENCOUNTER — Other Ambulatory Visit: Payer: Self-pay | Admitting: Internal Medicine

## 2020-06-28 DIAGNOSIS — B20 Human immunodeficiency virus [HIV] disease: Secondary | ICD-10-CM

## 2020-07-13 ENCOUNTER — Other Ambulatory Visit: Payer: Self-pay | Admitting: Internal Medicine

## 2020-07-13 DIAGNOSIS — B20 Human immunodeficiency virus [HIV] disease: Secondary | ICD-10-CM

## 2020-07-19 NOTE — Telephone Encounter (Signed)
Medication change for better affordability.

## 2020-07-26 ENCOUNTER — Other Ambulatory Visit (HOSPITAL_COMMUNITY)
Admission: RE | Admit: 2020-07-26 | Discharge: 2020-07-26 | Disposition: A | Discharge status: Home / Self Care | Payer: Medicaid Other | Source: Ambulatory Visit | Attending: Internal Medicine | Admitting: Internal Medicine

## 2020-07-26 ENCOUNTER — Encounter: Payer: Self-pay | Admitting: Internal Medicine

## 2020-07-26 ENCOUNTER — Ambulatory Visit (INDEPENDENT_AMBULATORY_CARE_PROVIDER_SITE_OTHER): Payer: Medicaid Other | Admitting: Internal Medicine

## 2020-07-26 ENCOUNTER — Ambulatory Visit (INDEPENDENT_AMBULATORY_CARE_PROVIDER_SITE_OTHER): Payer: Medicaid Other

## 2020-07-26 ENCOUNTER — Other Ambulatory Visit: Payer: Self-pay

## 2020-07-26 VITALS — BP 150/94 | HR 80 | Temp 97.4°F | Wt 169.0 lb

## 2020-07-26 DIAGNOSIS — Z23 Encounter for immunization: Secondary | ICD-10-CM

## 2020-07-26 DIAGNOSIS — Z0000 Encounter for general adult medical examination without abnormal findings: Secondary | ICD-10-CM | POA: Diagnosis not present

## 2020-07-26 DIAGNOSIS — B20 Human immunodeficiency virus [HIV] disease: Secondary | ICD-10-CM

## 2020-07-26 DIAGNOSIS — Z79899 Other long term (current) drug therapy: Secondary | ICD-10-CM | POA: Diagnosis not present

## 2020-07-26 NOTE — Progress Notes (Signed)
   Covid-19 Vaccination Clinic  Name:  Dawn [name]    MRN: [id number] DOB: May 01, 1971  07/26/2020  [name] was observed post Covid-19 immunization for 15 minutes without incident. She was provided with Vaccine Information Sheet and instruction to access the V-Safe system.   [name] was instructed to call 911 with any severe reactions post vaccine: Marland Kitchen Difficulty breathing  . Swelling of face and throat  . A fast heartbeat  . A bad rash all over body  . Dizziness and weakness   Immunizations Administered    Name Date Dose VIS Date Route   Pfizer COVID-19 Vaccine 07/26/2020 10:39 AM 0.3 mL 06/16/2020 Intramuscular   Manufacturer: [place]   Lot: [id number]   NDC: [place] Arieh Bogue, RN

## 2020-07-26 NOTE — Progress Notes (Signed)
RFV: follow up for hiv disease  Patient ID: Dawn [name], female   DOB: 14-Mar-1971, 49 y.o.   MRN: [id number]  HPI Dawn [name] is a 49yo F with hiv disease, well controlled on prezcobix-descovy.she has simple understanding of covid disease vs vaccine. She is also in a relationship where she is considering moving to [place], but possibly going to [place] to visit? For relocation. she expresses interest in getting covid vaccinated.  Outpatient Encounter Medications as of 07/26/2020  Medication Sig  . DESCOVY 200-25 MG tablet TAKE ONE TABLET BY MOUTH DAILY  . medroxyPROGESTERone (PROVERA) 10 MG tablet TAKE TWO TABLETS BY MOUTH DAILY  . PREZCOBIX 800-150 MG tablet TAKE ONE TABLET BY MOUTH DAILY WITH BREAKFAST. SWALLOW WHOLE. DO NOT CRUSH, BREAK, OR CHEW TABLETS  . valACYclovir (VALTREX) 1000 MG tablet TAKE 1 TABLET(1000 MG) BY MOUTH DAILY  . diphenhydrAMINE (BENADRYL) 25 MG tablet Take 1 tablet (25 mg total) by mouth every 6 (six) hours as needed for up to 3 days.  . famotidine (PEPCID) 20 MG tablet Take 1 tablet (20 mg total) by mouth 2 (two) times daily for 5 days.   No facility-administered encounter medications on file as of 07/26/2020.     Patient Active Problem List   Diagnosis Date Noted  . Viral illness 01/08/2019  . HIV disease ([place]) 03/05/2017  . HTN (hypertension) 03/05/2017  . Anemia due to chronic blood loss 03/13/2016  . Acquired immunodeficiency syndrome ([place]) 12/07/2015  . Acne vulgaris 12/07/2015  . Episodic mood disorder ([place]) 12/07/2015     Health Maintenance Due  Topic Date Due  . COVID-19 Vaccine (1) Never done  . INFLUENZA VACCINE  03/28/2020    Social History   Tobacco Use  . Smoking status: Former Smoker    Types: Cigarettes  . Smokeless tobacco: Never Used  Vaping Use  . Vaping Use: Never used  Substance Use Topics  . Alcohol use: Yes    Comment: hx of alcohol abuse- social drinker now  . Drug use: Not Currently    Types: Marijuana    Comment:  none in 8 yrs    Review of Systems Review of Systems  Constitutional: Negative for fever, chills, diaphoresis, activity change, appetite change, fatigue and unexpected weight change.  HENT: Negative for congestion, sore throat, rhinorrhea, sneezing, trouble swallowing and sinus pressure.  Eyes: Negative for photophobia and visual disturbance.  Respiratory: Negative for cough, chest tightness, shortness of breath, wheezing and stridor.  Cardiovascular: Negative for chest pain, palpitations and leg swelling.  Gastrointestinal: Negative for nausea, vomiting, abdominal pain, diarrhea, constipation, blood in stool, abdominal distention and anal bleeding.  Genitourinary: Negative for dysuria, hematuria, flank pain and difficulty urinating.  Musculoskeletal: Negative for myalgias, back pain, joint swelling, arthralgias and gait problem.  Skin: Negative for color change, pallor, rash and wound.  Neurological: Negative for dizziness, tremors, weakness and light-headedness.  Hematological: Negative for adenopathy. Does not bruise/bleed easily.  Psychiatric/Behavioral: Negative for behavioral problems, confusion, sleep disturbance, dysphoric mood, decreased concentration and agitation.    Physical Exam   BP (!) 150/94   Pulse 80   Temp (!) 97.4 F (36.3 C) (Oral)   Wt 169 lb (76.7 kg)   BMI 29.01 kg/m   Physical Exam  Constitutional:  oriented to person, place, and time. appears well-developed and well-nourished. No distress.  HENT: /AT, PERRLA, no scleral icterus Mouth/Throat: Oropharynx is clear and moist. No oropharyngeal exudate.  Cardiovascular: Normal rate, regular rhythm and normal heart sounds. Exam reveals no gallop and  no friction rub.  No murmur heard.  Pulmonary/Chest: Effort normal and breath sounds normal. No respiratory distress.  has no wheezes.  Neck = supple, no nuchal rigidity Abdominal: Soft. Bowel sounds are normal.  exhibits no distension. There is no tenderness.    Lymphadenopathy: no cervical adenopathy. No axillary adenopathy Neurological: alert and oriented to person, place, and time.  Skin: Skin is warm and dry. No rash noted. No erythema.  Psychiatric: a normal mood and affect.  behavior is normal.   Lab Results  Component Value Date   CD4TCELL 41 07/02/2019   Lab Results  Component Value Date   CD4TABS 447 07/02/2019   CD4TABS 420 02/04/2019   CD4TABS 440 07/29/2018   Lab Results  Component Value Date   HIV1RNAQUANT <20 NOT DETECTED 07/02/2019   Lab Results  Component Value Date   HEPBSAB POS (A) 12/09/2015   Lab Results  Component Value Date   LABRPR NON-REACTIVE 07/02/2019    CBC Lab Results  Component Value Date   WBC 3.1 (L) 07/02/2019   RBC 4.17 07/02/2019   HGB 10.5 (L) 07/02/2019   HCT 34.2 (L) 07/02/2019   PLT 310 07/02/2019   MCV 82.0 07/02/2019   MCH 25.2 (L) 07/02/2019   MCHC 30.7 (L) 07/02/2019   RDW 15.3 (H) 07/02/2019   LYMPHSABS 1,249 07/02/2019   MONOABS 0.4 03/07/2019   EOSABS 31 07/02/2019    BMET Lab Results  Component Value Date   NA 139 07/02/2019   K 4.1 07/02/2019   CL 105 07/02/2019   CO2 25 07/02/2019   GLUCOSE 87 07/02/2019   BUN 10 07/02/2019   CREATININE 0.90 07/02/2019   CALCIUM 9.4 07/02/2019   GFRNONAA 77 07/02/2019   GFRAA 89 07/02/2019      Assessment and Plan Health maintenance = plan to  give flu vaccine and see if she would want covid vaccine today through clinic  HIV disease = will plan to Check annual labs  Long term medication management = will check cr  Women's health = recommend mammogram and set up for pap smear  Travel = if she is to go to [place], recommend to contact [place] for pre travel counseling

## 2020-07-27 ENCOUNTER — Telehealth: Payer: Self-pay | Admitting: *Deleted

## 2020-07-27 LAB — URINE CYTOLOGY ANCILLARY ONLY
Chlamydia: NEGATIVE
Comment: NEGATIVE
Comment: NORMAL
Neisseria Gonorrhea: NEGATIVE

## 2020-07-27 LAB — T-HELPER CELL (CD4) - (RCID CLINIC ONLY)
CD4 % Helper T Cell: 39 % (ref 33–65)
CD4 T Cell Abs: 396 /uL — ABNORMAL LOW (ref 400–1790)

## 2020-07-28 NOTE — Telephone Encounter (Signed)
Patient asked for advice. She will be staying with a friend in [place] for a few months. She would like Sam at [facility] to mail her medication there. RN spoke with Sam.  He is able to mail to [place], only needs Miranda to contact him with a good phone number and new address.  RN relayed this to [place], gave her the pharmacy number and asked her to speak directly with Sam. Landis Gandy, RN

## 2020-07-29 LAB — CBC WITH DIFFERENTIAL/PLATELET
Absolute Monocytes: 329 {cells}/uL (ref 200–950)
Basophils Absolute: 19 {cells}/uL (ref 0–200)
Basophils Relative: 0.5 %
Eosinophils Absolute: 11 {cells}/uL — ABNORMAL LOW (ref 15–500)
Eosinophils Relative: 0.3 %
HCT: 38 % (ref 35.0–45.0)
Hemoglobin: 12.3 g/dL (ref 11.7–15.5)
Lymphs Abs: 1243 {cells}/uL (ref 850–3900)
MCH: 28.9 pg (ref 27.0–33.0)
MCHC: 32.4 g/dL (ref 32.0–36.0)
MCV: 89.2 fL (ref 80.0–100.0)
MPV: 10.9 fL (ref 7.5–12.5)
Monocytes Relative: 8.9 %
Neutro Abs: 2098 {cells}/uL (ref 1500–7800)
Neutrophils Relative %: 56.7 %
Platelets: 292 Thousand/uL (ref 140–400)
RBC: 4.26 Million/uL (ref 3.80–5.10)
RDW: 12.6 % (ref 11.0–15.0)
Total Lymphocyte: 33.6 %
WBC: 3.7 Thousand/uL — ABNORMAL LOW (ref 3.8–10.8)

## 2020-07-29 LAB — LIPID PANEL
Cholesterol: 228 mg/dL — ABNORMAL HIGH
HDL: 53 mg/dL
LDL Cholesterol (Calc): 153 mg/dL — ABNORMAL HIGH
Non-HDL Cholesterol (Calc): 175 mg/dL — ABNORMAL HIGH
Total CHOL/HDL Ratio: 4.3 (calc)
Triglycerides: 106 mg/dL

## 2020-07-29 LAB — COMPLETE METABOLIC PANEL WITHOUT GFR
AG Ratio: 1.3 (calc) (ref 1.0–2.5)
ALT: 12 U/L (ref 6–29)
AST: 14 U/L (ref 10–35)
Albumin: 4.5 g/dL (ref 3.6–5.1)
Alkaline phosphatase (APISO): 66 U/L (ref 31–125)
BUN: 11 mg/dL (ref 7–25)
CO2: 28 mmol/L (ref 20–32)
Calcium: 9.5 mg/dL (ref 8.6–10.2)
Chloride: 104 mmol/L (ref 98–110)
Creat: 0.94 mg/dL (ref 0.50–1.10)
GFR, Est African American: 84 mL/min/1.73m2
GFR, Est Non African American: 72 mL/min/1.73m2
Globulin: 3.5 g/dL (ref 1.9–3.7)
Glucose, Bld: 91 mg/dL (ref 65–99)
Potassium: 3.6 mmol/L (ref 3.5–5.3)
Sodium: 139 mmol/L (ref 135–146)
Total Bilirubin: 0.6 mg/dL (ref 0.2–1.2)
Total Protein: 8 g/dL (ref 6.1–8.1)

## 2020-07-29 LAB — HEPATITIS C ANTIBODY
Hepatitis C Ab: NONREACTIVE
SIGNAL TO CUT-OFF: 0.05 (ref <1.00)

## 2020-07-29 LAB — HIV-1 RNA QUANT-NO REFLEX-BLD
HIV 1 RNA Quant: <20 {copies}/mL
HIV-1 RNA Quant, Log: <1.3 {Log_copies}/mL

## 2020-07-29 LAB — RPR: RPR Ser Ql: NONREACTIVE

## 2020-08-11 ENCOUNTER — Ambulatory Visit
Admission: EM | Admit: 2020-08-11 | Discharge: 2020-08-11 | Disposition: A | Discharge status: Home / Self Care | Payer: Medicaid Other | Attending: Emergency Medicine | Admitting: Emergency Medicine

## 2020-08-11 DIAGNOSIS — J019 Acute sinusitis, unspecified: Secondary | ICD-10-CM

## 2020-08-11 DIAGNOSIS — Z20822 Contact with and (suspected) exposure to covid-19: Secondary | ICD-10-CM

## 2020-08-11 MED ORDER — BENZONATATE 200 MG PO CAPS: 200.0000 mg | ORAL_CAPSULE | Freq: Three times a day (TID) | ORAL | 0 refills | Status: AC | PRN

## 2020-08-11 MED ORDER — DM-GUAIFENESIN ER 30-600 MG PO TB12: 1.0000 | ORAL_TABLET | Freq: Two times a day (BID) | ORAL | 0 refills | Status: DC

## 2020-08-11 MED ORDER — AMOXICILLIN-POT CLAVULANATE 875-125 MG PO TABS: 1.0000 | ORAL_TABLET | Freq: Two times a day (BID) | ORAL | 0 refills | Status: AC

## 2020-08-11 MED ORDER — ALBUTEROL SULFATE HFA 108 (90 BASE) MCG/ACT IN AERS
1.0000 | INHALATION_SPRAY | Freq: Four times a day (QID) | RESPIRATORY_TRACT | 0 refills | Status: DC | PRN
Start: 2020-08-11 — End: 2021-06-14

## 2020-08-11 NOTE — ED Triage Notes (Signed)
Pt c/o cough, SOB, headache, sinus pressure, chest/nasal congestion, chills, and abdominal pain since 12/9. States received her covid vaccine 11/29

## 2020-08-11 NOTE — Discharge Instructions (Signed)
Covid test pending, monitor my chart for results Begin Augmentin twice daily for the next week to treat sinus infection Albuterol inhaler as needed for shortness of breath chest tightness and wheezing Mucinex DM twice daily to help with cough and congestion Tessalon every 8 hours for cough Rest and fluids Follow-up if not improving or worsening

## 2020-08-11 NOTE — ED Provider Notes (Signed)
[facility]    CSN: [id number] Arrival date & time: 08/11/20  1356      History   Chief Complaint Chief Complaint  Patient presents with   Nasal Congestion    HPI Dawn [name] is a 49 y.o. female presenting today for evaluation of URI symptoms.  Reports that she has had cough congestion chills headache and shortness of breath for approximately 1 week.  She has also had some abdominal discomfort.  Reports abdominal discomfort is associated with soreness from coughing.  Reports wheezing and whistling in lungs.  HPI  Past Medical History:  Diagnosis Date   Anemia    Anxiety    Fibroid    Headache(784.0)    HIV (human immunodeficiency virus infection) ([place])    Infection    UTI    Patient Active Problem List   Diagnosis Date Noted   Viral illness 01/08/2019   HIV disease ([place]) 03/05/2017   HTN (hypertension) 03/05/2017   Anemia due to chronic blood loss 03/13/2016   Acquired immunodeficiency syndrome ([place]) 12/07/2015   Acne vulgaris 12/07/2015   Episodic mood disorder ([place]) 12/07/2015    Past Surgical History:  Procedure Laterality Date   CESAREAN SECTION     DILATION AND CURETTAGE OF UTERUS     HAND TENDON SURGERY     right thumb   INDUCED ABORTION     TUBAL LIGATION      OB History    Gravida  6   Para  4   Term  4   Preterm      AB  2   Living  4     SAB  1   IAB  1   Ectopic      Multiple      Live Births  4            Home Medications    Prior to Admission medications   Medication Sig Start Date End Date Taking? Authorizing Provider  albuterol (VENTOLIN HFA) 108 (90 Base) MCG/ACT inhaler Inhale 1-2 puffs into the lungs every 6 (six) hours as needed for wheezing or shortness of breath. 08/11/20   Kristan Votta C, PA-C  amoxicillin-clavulanate (AUGMENTIN) 875-125 MG tablet Take 1 tablet by mouth every 12 (twelve) hours for 7 days. 08/11/20 08/18/20  Laprecious Austill C, PA-C  benzonatate  (TESSALON) 200 MG capsule Take 1 capsule (200 mg total) by mouth 3 (three) times daily as needed for up to 7 days for cough. 08/11/20 08/18/20  Nechuma Boven, Elesa Hacker, PA-C  DESCOVY 200-25 MG tablet TAKE ONE TABLET BY MOUTH DAILY 07/13/20   Carlyle Basques, MD  dextromethorphan-guaiFENesin [facility] DM) 30-600 MG 12hr tablet Take 1 tablet by mouth 2 (two) times daily. 08/11/20   Markita Stcharles C, PA-C  diphenhydrAMINE (BENADRYL) 25 MG tablet Take 1 tablet (25 mg total) by mouth every 6 (six) hours as needed for up to 3 days. 06/17/20 06/20/20  Lannie Fields, PA-C  famotidine (PEPCID) 20 MG tablet Take 1 tablet (20 mg total) by mouth 2 (two) times daily for 5 days. 06/17/20 06/22/20  Lannie Fields, PA-C  medroxyPROGESTERone (PROVERA) 10 MG tablet TAKE TWO TABLETS BY MOUTH DAILY 08/07/19   Sloan Leiter, MD  PREZCOBIX 800-150 MG tablet TAKE ONE TABLET BY MOUTH DAILY WITH BREAKFAST. SWALLOW WHOLE. DO NOT CRUSH, BREAK, OR CHEW TABLETS 07/13/20   Carlyle Basques, MD  valACYclovir (VALTREX) 1000 MG tablet TAKE 1 TABLET(1000 MG) BY MOUTH DAILY 03/26/20   Carlyle Basques, MD  Family History Family History  Problem Relation Age of Onset   Breast cancer Neg Hx     Social History Social History   Tobacco Use   Smoking status: Former Smoker    Types: Cigarettes   Smokeless tobacco: Never Used  Scientific laboratory technician Use: Never used  Substance Use Topics   Alcohol use: Yes    Comment: hx of alcohol abuse- social drinker now   Drug use: Not Currently    Types: Marijuana    Comment: none in 8 yrs     Allergies   Chocolate and Tomato   Review of Systems Review of Systems  Constitutional: Negative for activity change, appetite change, chills, fatigue and fever.  HENT: Positive for congestion, rhinorrhea, sinus pressure and sore throat. Negative for ear pain and trouble swallowing.   Eyes: Negative for discharge and redness.  Respiratory: Positive for cough. Negative for chest tightness  and shortness of breath.   Cardiovascular: Negative for chest pain.  Gastrointestinal: Negative for abdominal pain, diarrhea, nausea and vomiting.  Musculoskeletal: Negative for myalgias.  Skin: Negative for rash.  Neurological: Negative for dizziness, light-headedness and headaches.     Physical Exam Triage Vital Signs ED Triage Vitals  Enc Vitals Group     BP 08/11/20 1453 (!) 153/84     Pulse Rate 08/11/20 1453 93     Resp 08/11/20 1453 18     Temp 08/11/20 1453 98.9 F (37.2 C)     Temp Source 08/11/20 1453 Oral     SpO2 08/11/20 1453 96 %     Weight --      Height --      Head Circumference --      Peak Flow --      Pain Score 08/11/20 1454 10     Pain Loc --      Pain Edu? --      Excl. in [place]? --    No data found.  Updated Vital Signs BP (!) 153/84 (BP Location: Left Arm)    Pulse 93    Temp 98.9 F (37.2 C) (Oral)    Resp 18    SpO2 96%   Visual Acuity Right Eye Distance:   Left Eye Distance:   Bilateral Distance:    Right Eye Near:   Left Eye Near:    Bilateral Near:     Physical Exam Vitals and nursing note reviewed.  Constitutional:      Appearance: She is well-developed and well-nourished.     Comments: No acute distress  HENT:     Head: Normocephalic and atraumatic.     Ears:     Comments: Bilateral ears without tenderness to palpation of external auricle, tragus and mastoid, EAC's without erythema or swelling, TM's with good bony landmarks and cone of light. Non erythematous.     Nose: Nose normal.     Mouth/Throat:     Comments: Oral mucosa pink and moist, no tonsillar enlargement or exudate. Posterior pharynx patent and nonerythematous, no uvula deviation or swelling. Normal phonation. Eyes:     Conjunctiva/sclera: Conjunctivae normal.  Cardiovascular:     Rate and Rhythm: Normal rate.  Pulmonary:     Effort: Pulmonary effort is normal. No respiratory distress.     Comments: Breathing comfortably at rest, CTABL, no wheezing, rales or other  adventitious sounds auscultated Abdominal:     General: There is no distension.  Musculoskeletal:        General: Normal range of motion.  Cervical back: Neck supple.  Skin:    General: Skin is warm and dry.  Neurological:     Mental Status: She is alert and oriented to person, place, and time.  Psychiatric:        Mood and Affect: Mood and affect normal.      UC Treatments / Results  Labs (all labs ordered are listed, but only abnormal results are displayed) Labs Reviewed  NOVEL CORONAVIRUS, NAA    EKG   Radiology No results found.  Procedures Procedures (including critical care time)  Medications Ordered in UC Medications - No data to display  Initial Impression / Assessment and Plan / UC Course  I have reviewed the triage vital signs and the nursing notes.  Pertinent labs & imaging results that were available during my care of the patient were reviewed by me and considered in my medical decision making (see chart for details).     Covid test pending.  URI symptoms greater than 1 week, treating for sinusitis with Augmentin, albuterol for wheezing and shortness of breath, deferring steroids given HIV status.  Rest and fluids.  Discussed strict return precautions. Patient verbalized understanding and is agreeable with plan.  Final Clinical Impressions(s) / UC Diagnoses   Final diagnoses:  Encounter for screening laboratory testing for COVID-19 virus  Acute sinusitis with symptoms > 10 days     Discharge Instructions     Covid test pending, monitor my chart for results Begin Augmentin twice daily for the next week to treat sinus infection Albuterol inhaler as needed for shortness of breath chest tightness and wheezing Mucinex DM twice daily to help with cough and congestion Tessalon every 8 hours for cough Rest and fluids Follow-up if not improving or worsening    ED Prescriptions    Medication Sig Dispense Auth. Provider   amoxicillin-clavulanate  (AUGMENTIN) 875-125 MG tablet Take 1 tablet by mouth every 12 (twelve) hours for 7 days. 14 tablet Avrie Kedzierski C, PA-C   albuterol (VENTOLIN HFA) 108 (90 Base) MCG/ACT inhaler Inhale 1-2 puffs into the lungs every 6 (six) hours as needed for wheezing or shortness of breath. 18 g Josejulian Tarango C, PA-C   benzonatate (TESSALON) 200 MG capsule Take 1 capsule (200 mg total) by mouth 3 (three) times daily as needed for up to 7 days for cough. 28 capsule Cortny Bambach C, PA-C   dextromethorphan-guaiFENesin (MUCINEX DM) 30-600 MG 12hr tablet Take 1 tablet by mouth 2 (two) times daily. 20 tablet [employer] [employer] C, PA-C     PDMP not reviewed this encounter.   Janith Lima, PA-C 08/11/20 1525

## 2020-08-13 ENCOUNTER — Ambulatory Visit: Payer: Medicaid Other

## 2020-08-13 LAB — NOVEL CORONAVIRUS, NAA: SARS-CoV-2, NAA: NOT DETECTED

## 2020-08-13 LAB — SARS-COV-2, NAA 2 DAY TAT

## 2020-08-29 ENCOUNTER — Emergency Department (HOSPITAL_COMMUNITY)
Admission: EM | Admit: 2020-08-29 | Discharge: 2020-08-29 | Disposition: A | Discharge status: Home / Self Care | Payer: Medicaid Other | Attending: Emergency Medicine | Admitting: Emergency Medicine

## 2020-08-29 ENCOUNTER — Encounter (HOSPITAL_COMMUNITY): Payer: Self-pay | Admitting: Emergency Medicine

## 2020-08-29 ENCOUNTER — Emergency Department (HOSPITAL_COMMUNITY)
Admission: EM | Admit: 2020-08-29 | Discharge: 2020-08-29 | Discharge status: Against Medical Advice | Payer: Medicaid Other

## 2020-08-29 ENCOUNTER — Other Ambulatory Visit: Payer: Self-pay

## 2020-08-29 ENCOUNTER — Emergency Department (HOSPITAL_COMMUNITY): Payer: Medicaid Other

## 2020-08-29 DIAGNOSIS — B20 Human immunodeficiency virus [HIV] disease: Secondary | ICD-10-CM | POA: Insufficient documentation

## 2020-08-29 DIAGNOSIS — I10 Essential (primary) hypertension: Secondary | ICD-10-CM | POA: Diagnosis not present

## 2020-08-29 DIAGNOSIS — R509 Fever, unspecified: Secondary | ICD-10-CM | POA: Diagnosis present

## 2020-08-29 DIAGNOSIS — Z87891 Personal history of nicotine dependence: Secondary | ICD-10-CM | POA: Diagnosis not present

## 2020-08-29 DIAGNOSIS — B349 Viral infection, unspecified: Secondary | ICD-10-CM | POA: Diagnosis not present

## 2020-08-29 DIAGNOSIS — Z20822 Contact with and (suspected) exposure to covid-19: Secondary | ICD-10-CM | POA: Diagnosis not present

## 2020-08-29 LAB — RESP PANEL BY RT-PCR (FLU A&B, COVID) ARPGX2
Influenza A by PCR: NEGATIVE
Influenza B by PCR: NEGATIVE
SARS Coronavirus 2 by RT PCR: NEGATIVE

## 2020-08-29 MED ORDER — ONDANSETRON HCL 4 MG PO TABS: 4.0000 mg | ORAL_TABLET | Freq: Three times a day (TID) | ORAL | 0 refills | Status: DC | PRN

## 2020-08-29 MED ORDER — BENZONATATE 100 MG PO CAPS: 100.0000 mg | ORAL_CAPSULE | Freq: Three times a day (TID) | ORAL | 0 refills | Status: DC

## 2020-08-29 MED ORDER — ACETAMINOPHEN 325 MG PO TABS
650.0000 mg | ORAL_TABLET | Freq: Once | ORAL | Status: AC | PRN
  Administered 2020-08-29: 650 mg via ORAL
  Filled 2020-08-29: qty 2

## 2020-08-29 MED ORDER — ONDANSETRON 4 MG PO TBDP
4.0000 mg | ORAL_TABLET | Freq: Once | ORAL | Status: AC
  Administered 2020-08-29: 4 mg via ORAL

## 2020-08-29 MED ORDER — PROMETHAZINE-DM 6.25-15 MG/5ML PO SYRP: 5.0000 mL | ORAL_SOLUTION | Freq: Four times a day (QID) | ORAL | 0 refills | Status: DC | PRN

## 2020-08-29 NOTE — ED Triage Notes (Signed)
Patient states that since new years eve she has had a fever, headache, generalized body aches, endorses nausea but no emesis, endorses diarrhea.

## 2020-08-29 NOTE — ED Notes (Addendum)
Pt called for triage. No answer.

## 2020-08-29 NOTE — ED Provider Notes (Signed)
[facility] DEPT Provider Note   CSN: [id number] Arrival date & time: 08/29/20  0900     History Chief Complaint  Patient presents with  . Fever  . Generalized Body Aches    Dawn [name] is a 50 y.o. female presenting for evaluation of fever, cough, ha, body aches, and nausea.   Pt states sxs began 2 days ago. sxs include HA, body aches, nasal congestion, cough, and nausea. She reports her roommate is sick.  She is partially vaccinated (1/2 shots). She has been taking tea with honey and lemon. She has taken ibuprofen. Nothing makes it better or worse.   additional history of HIV, pt states is well controlled with undetectable viral load.   HPI     Past Medical History:  Diagnosis Date  . Anemia   . Anxiety   . Fibroid   . Headache(784.0)   . HIV (human immunodeficiency virus infection) ([place])   . Infection    UTI    Patient Active Problem List   Diagnosis Date Noted  . Viral illness 01/08/2019  . HIV disease ([place]) 03/05/2017  . HTN (hypertension) 03/05/2017  . Anemia due to chronic blood loss 03/13/2016  . Acquired immunodeficiency syndrome ([place]) 12/07/2015  . Acne vulgaris 12/07/2015  . Episodic mood disorder ([place]) 12/07/2015    Past Surgical History:  Procedure Laterality Date  . CESAREAN SECTION    . DILATION AND CURETTAGE OF UTERUS    . HAND TENDON SURGERY     right thumb  . INDUCED ABORTION    . TUBAL LIGATION       OB History    Gravida  6   Para  4   Term  4   Preterm      AB  2   Living  4     SAB  1   IAB  1   Ectopic      Multiple      Live Births  4           Family History  Problem Relation Age of Onset  . Breast cancer Neg Hx     Social History   Tobacco Use  . Smoking status: Former Smoker    Types: Cigarettes  . Smokeless tobacco: Never Used  Vaping Use  . Vaping Use: Never used  Substance Use Topics  . Alcohol use: Yes    Comment: hx of alcohol abuse- social drinker now   . Drug use: Not Currently    Types: Marijuana    Comment: none in 8 yrs    Home Medications Prior to Admission medications   Medication Sig Start Date End Date Taking? Authorizing Provider  benzonatate (TESSALON) 100 MG capsule Take 1 capsule (100 mg total) by mouth every 8 (eight) hours. 08/29/20  Yes Ossie Yebra, PA-C  ondansetron (ZOFRAN) 4 MG tablet Take 1 tablet (4 mg total) by mouth every 8 (eight) hours as needed for nausea or vomiting. 08/29/20  Yes Edvardo Honse, PA-C  promethazine-dextromethorphan (PROMETHAZINE-DM) 6.25-15 MG/5ML syrup Take 5 mLs by mouth 4 (four) times daily as needed for cough. 08/29/20  Yes Lashay Osborne, PA-C  albuterol (VENTOLIN HFA) 108 (90 Base) MCG/ACT inhaler Inhale 1-2 puffs into the lungs every 6 (six) hours as needed for wheezing or shortness of breath. 08/11/20   Wieters, Elesa Hacker, PA-C  DESCOVY 200-25 MG tablet TAKE ONE TABLET BY MOUTH DAILY 07/13/20   Carlyle Basques, MD  dextromethorphan-guaiFENesin [facility] DM) 30-600 MG 12hr tablet Take  1 tablet by mouth 2 (two) times daily. 08/11/20   Wieters, Hallie C, PA-C  diphenhydrAMINE (BENADRYL) 25 MG tablet Take 1 tablet (25 mg total) by mouth every 6 (six) hours as needed for up to 3 days. 06/17/20 06/20/20  Lannie Fields, PA-C  famotidine (PEPCID) 20 MG tablet Take 1 tablet (20 mg total) by mouth 2 (two) times daily for 5 days. 06/17/20 06/22/20  Lannie Fields, PA-C  medroxyPROGESTERone (PROVERA) 10 MG tablet TAKE TWO TABLETS BY MOUTH DAILY 08/07/19   Sloan Leiter, MD  PREZCOBIX 800-150 MG tablet TAKE ONE TABLET BY MOUTH DAILY WITH BREAKFAST. SWALLOW WHOLE. DO NOT CRUSH, BREAK, OR CHEW TABLETS 07/13/20   Carlyle Basques, MD  valACYclovir (VALTREX) 1000 MG tablet TAKE 1 TABLET(1000 MG) BY MOUTH DAILY 03/26/20   Carlyle Basques, MD    Allergies    Chocolate and Tomato  Review of Systems   Review of Systems  Constitutional: Positive for fever.  HENT: Positive for congestion.    Respiratory: Positive for cough.   Gastrointestinal: Positive for nausea.  Musculoskeletal: Positive for myalgias.  Neurological: Positive for headaches.  All other systems reviewed and are negative.   Physical Exam Updated Vital Signs BP 140/80 (BP Location: Left Arm)   Pulse (!) 117   Temp 99.4 F (37.4 C) (Oral)   Resp 18   Ht 5\' 4"  (1.626 m)   Wt 77 kg   SpO2 95%   BMI 29.14 kg/m   Physical Exam Vitals and nursing note reviewed.  Constitutional:      General: She is not in acute distress.    Appearance: She is well-developed and well-nourished.     Comments: Appears nontoxic  HENT:     Head: Normocephalic and atraumatic.     Mouth/Throat:     Comments: OP clear without tonsillar swelling or exudate. Uvula midline with equal palate rise.  Eyes:     Extraocular Movements: Extraocular movements intact and EOM normal.     Conjunctiva/sclera: Conjunctivae normal.     Pupils: Pupils are equal, round, and reactive to light.  Cardiovascular:     Rate and Rhythm: Normal rate and regular rhythm.     Pulses: Normal pulses and intact distal pulses.  Pulmonary:     Effort: Pulmonary effort is normal. No respiratory distress.     Breath sounds: Normal breath sounds. No wheezing.     Comments: Speaking in full sentences. Clear lung sounds. spo2 stable on RA Abdominal:     General: There is no distension.     Palpations: Abdomen is soft. There is no mass.     Tenderness: There is no abdominal tenderness. There is no guarding or rebound.  Musculoskeletal:        General: Normal range of motion.     Cervical back: Normal range of motion and neck supple.  Skin:    General: Skin is warm and dry.     Capillary Refill: Capillary refill takes less than 2 seconds.  Neurological:     Mental Status: She is alert and oriented to person, place, and time.  Psychiatric:        Mood and Affect: Mood and affect normal.     ED Results / Procedures / Treatments   Labs (all labs  ordered are listed, but only abnormal results are displayed) Labs Reviewed  RESP PANEL BY RT-PCR (FLU A&B, COVID) [id number]    EKG None  Radiology DG Chest 2 View  Result Date: 08/29/2020 CLINICAL DATA:  Fever, headache and nausea. EXAM: CHEST - 2 VIEW COMPARISON:  07/31/2017 FINDINGS: The cardiac silhouette, mediastinal and hilar contours are within normal limits. The lungs are clear. No infiltrates or effusions. No worrisome pulmonary lesions. The bony thorax is intact. IMPRESSION: No acute cardiopulmonary findings. Electronically Signed   By: Rudie Meyer M.D.   On: 08/29/2020 16:23    Procedures Procedures (including critical care time)  Medications Ordered in ED Medications  ondansetron (ZOFRAN-ODT) disintegrating tablet 4 mg (4 mg Oral Given 08/29/20 0915)  acetaminophen (TYLENOL) tablet 650 mg (650 mg Oral Given 08/29/20 1142)    ED Course  I have reviewed the triage vital signs and the nursing notes.  Pertinent labs & imaging results that were available during my care of the patient were reviewed by me and considered in my medical decision making (see chart for details).    MDM Rules/Calculators/A&P                          Patient presenting with 2 day h/o URI symptoms.  Physical exam reassuring, patient appears nontoxic.  Pulmonary exam reassuring.  Doubt pneumonia, strep, other bacterial infection, or peritonsillar abscess. covid and flu testing negative. cxr viewed and interpreted by me, no pna, pnx, effusion.  Likely viral URI.  Will treat symptomatically.  Patient to follow-up with primary care as needed.  At this time, patient appears safe for discharge.  Return precautions given.  Patient states she understand and agrees to plan.  Final Clinical Impression(s) / ED Diagnoses Final diagnoses:  Viral illness    Rx / DC Orders ED Discharge Orders         Ordered    ondansetron (ZOFRAN) 4 MG tablet  Every 8 hours PRN        08/29/20 1638    benzonatate (TESSALON) 100  MG capsule  Every 8 hours        08/29/20 1638    promethazine-dextromethorphan (PROMETHAZINE-DM) 6.25-15 MG/5ML syrup  4 times daily PRN        08/29/20 1638           Shirin Echeverry, PA-C 08/29/20 1645    Derwood Kaplan, MD 08/31/20 [phone]

## 2020-08-29 NOTE — Discharge Instructions (Signed)
You likely have a viral illness.  This should be treated symptomatically. Use Tylenol or ibuprofen as needed for fevers or body aches. Use zofran as needed for nausea and vomiting.  Use cough drops as syrup as needed. Make sure you stay well-hydrated with water. Wash your hands frequently to prevent spread of infection. Follow-up with your primary care doctor in 1 week if your symptoms are not improving. Return to the emergency room if you develop chest pain, difficulty breathing, or any new or worsening symptoms.

## 2020-08-31 ENCOUNTER — Other Ambulatory Visit: Payer: Self-pay

## 2020-08-31 ENCOUNTER — Emergency Department (HOSPITAL_COMMUNITY)
Admission: EM | Admit: 2020-08-31 | Discharge: 2020-08-31 | Disposition: A | Discharge status: Home / Self Care | Payer: Medicaid Other | Attending: Emergency Medicine | Admitting: Emergency Medicine

## 2020-08-31 ENCOUNTER — Encounter (HOSPITAL_COMMUNITY): Payer: Self-pay | Admitting: Emergency Medicine

## 2020-08-31 DIAGNOSIS — N939 Abnormal uterine and vaginal bleeding, unspecified: Secondary | ICD-10-CM | POA: Insufficient documentation

## 2020-08-31 DIAGNOSIS — R102 Pelvic and perineal pain: Secondary | ICD-10-CM | POA: Insufficient documentation

## 2020-08-31 DIAGNOSIS — R42 Dizziness and giddiness: Secondary | ICD-10-CM | POA: Diagnosis not present

## 2020-08-31 DIAGNOSIS — Z5321 Procedure and treatment not carried out due to patient leaving prior to being seen by health care provider: Secondary | ICD-10-CM | POA: Insufficient documentation

## 2020-08-31 DIAGNOSIS — Z90710 Acquired absence of both cervix and uterus: Secondary | ICD-10-CM | POA: Insufficient documentation

## 2020-08-31 LAB — CBC WITH DIFFERENTIAL/PLATELET
Abs Immature Granulocytes: 0.04 10*3/uL (ref 0.00–0.07)
Basophils Absolute: 0 10*3/uL (ref 0.0–0.1)
Basophils Relative: 0 %
Eosinophils Absolute: 0 10*3/uL (ref 0.0–0.5)
Eosinophils Relative: 0 %
HCT: 33.6 % — ABNORMAL LOW (ref 36.0–46.0)
Hemoglobin: 10.9 g/dL — ABNORMAL LOW (ref 12.0–15.0)
Immature Granulocytes: 1 %
Lymphocytes Relative: 12 %
Lymphs Abs: 1 10*3/uL (ref 0.7–4.0)
MCH: 29.5 pg (ref 26.0–34.0)
MCHC: 32.4 g/dL (ref 30.0–36.0)
MCV: 91.1 fL (ref 80.0–100.0)
Monocytes Absolute: 1.1 10*3/uL — ABNORMAL HIGH (ref 0.1–1.0)
Monocytes Relative: 12 %
Neutro Abs: 6.6 10*3/uL (ref 1.7–7.7)
Neutrophils Relative %: 75 %
Platelets: 273 10*3/uL (ref 150–400)
RBC: 3.69 MIL/uL — ABNORMAL LOW (ref 3.87–5.11)
RDW: 13.3 % (ref 11.5–15.5)
WBC: 8.8 10*3/uL (ref 4.0–10.5)
nRBC: 0 % (ref 0.0–0.2)

## 2020-08-31 LAB — COMPREHENSIVE METABOLIC PANEL
ALT: 12 U/L (ref 0–44)
AST: 14 U/L — ABNORMAL LOW (ref 15–41)
Albumin: 3.7 g/dL (ref 3.5–5.0)
Alkaline Phosphatase: 59 U/L (ref 38–126)
Anion gap: 15 (ref 5–15)
BUN: 14 mg/dL (ref 6–20)
CO2: 22 mmol/L (ref 22–32)
Calcium: 9 mg/dL (ref 8.9–10.3)
Chloride: 97 mmol/L — ABNORMAL LOW (ref 98–111)
Creatinine, Ser: 1.01 mg/dL — ABNORMAL HIGH (ref 0.44–1.00)
GFR, Estimated: >60 mL/min (ref >60)
Glucose, Bld: 110 mg/dL — ABNORMAL HIGH (ref 70–99)
Potassium: 2.8 mmol/L — ABNORMAL LOW (ref 3.5–5.1)
Sodium: 134 mmol/L — ABNORMAL LOW (ref 135–145)
Total Bilirubin: 1 mg/dL (ref 0.3–1.2)
Total Protein: 8.1 g/dL (ref 6.5–8.1)

## 2020-08-31 LAB — I-STAT BETA HCG BLOOD, ED (MC, WL, AP ONLY): I-stat hCG, quantitative: <5 m[IU]/mL (ref <5)

## 2020-08-31 NOTE — ED Triage Notes (Signed)
Pt reports heavy vaginal bleeding with clots for the last few years. States that tonight was especially heavy. States that she "was supposed to get a hysterectomy, but was too scared." Endorses intermittent dizziness and pelvic pain with walking.

## 2020-09-03 ENCOUNTER — Emergency Department (HOSPITAL_COMMUNITY)
Admission: EM | Admit: 2020-09-03 | Discharge: 2020-09-04 | Disposition: A | Discharge status: Home / Self Care | Payer: Medicaid Other | Attending: Emergency Medicine | Admitting: Emergency Medicine

## 2020-09-03 ENCOUNTER — Ambulatory Visit: Payer: Medicaid Other

## 2020-09-03 ENCOUNTER — Encounter (HOSPITAL_COMMUNITY): Payer: Self-pay

## 2020-09-03 ENCOUNTER — Emergency Department (HOSPITAL_COMMUNITY): Payer: Medicaid Other

## 2020-09-03 ENCOUNTER — Other Ambulatory Visit: Payer: Self-pay

## 2020-09-03 DIAGNOSIS — R000 Tachycardia, unspecified: Secondary | ICD-10-CM | POA: Diagnosis not present

## 2020-09-03 DIAGNOSIS — J189 Pneumonia, unspecified organism: Secondary | ICD-10-CM | POA: Insufficient documentation

## 2020-09-03 DIAGNOSIS — Z21 Asymptomatic human immunodeficiency virus [HIV] infection status: Secondary | ICD-10-CM | POA: Insufficient documentation

## 2020-09-03 DIAGNOSIS — N939 Abnormal uterine and vaginal bleeding, unspecified: Secondary | ICD-10-CM | POA: Insufficient documentation

## 2020-09-03 DIAGNOSIS — I10 Essential (primary) hypertension: Secondary | ICD-10-CM | POA: Diagnosis not present

## 2020-09-03 DIAGNOSIS — Z87891 Personal history of nicotine dependence: Secondary | ICD-10-CM | POA: Diagnosis not present

## 2020-09-03 DIAGNOSIS — E876 Hypokalemia: Secondary | ICD-10-CM | POA: Insufficient documentation

## 2020-09-03 DIAGNOSIS — B20 Human immunodeficiency virus [HIV] disease: Secondary | ICD-10-CM

## 2020-09-03 LAB — CBC WITH DIFFERENTIAL/PLATELET
Abs Immature Granulocytes: 0.03 10*3/uL (ref 0.00–0.07)
Basophils Absolute: 0 10*3/uL (ref 0.0–0.1)
Basophils Relative: 0 %
Eosinophils Absolute: 0 10*3/uL (ref 0.0–0.5)
Eosinophils Relative: 1 %
HCT: 30.1 % — ABNORMAL LOW (ref 36.0–46.0)
Hemoglobin: 9.9 g/dL — ABNORMAL LOW (ref 12.0–15.0)
Immature Granulocytes: 1 %
Lymphocytes Relative: 21 %
Lymphs Abs: 1.3 10*3/uL (ref 0.7–4.0)
MCH: 29.1 pg (ref 26.0–34.0)
MCHC: 32.9 g/dL (ref 30.0–36.0)
MCV: 88.5 fL (ref 80.0–100.0)
Monocytes Absolute: 0.7 10*3/uL (ref 0.1–1.0)
Monocytes Relative: 10 %
Neutro Abs: 4.4 10*3/uL (ref 1.7–7.7)
Neutrophils Relative %: 67 %
Platelets: 353 10*3/uL (ref 150–400)
RBC: 3.4 MIL/uL — ABNORMAL LOW (ref 3.87–5.11)
RDW: 13.2 % (ref 11.5–15.5)
WBC: 6.5 10*3/uL (ref 4.0–10.5)
nRBC: 0 % (ref 0.0–0.2)

## 2020-09-03 LAB — URINALYSIS, ROUTINE W REFLEX MICROSCOPIC
Bacteria, UA: NONE SEEN
Bilirubin Urine: NEGATIVE
Glucose, UA: NEGATIVE mg/dL
Ketones, ur: 20 mg/dL — AB
Leukocytes,Ua: NEGATIVE
Nitrite: NEGATIVE
Protein, ur: 100 mg/dL — AB
RBC / HPF: >50 RBC/hpf — ABNORMAL HIGH (ref 0–5)
Specific Gravity, Urine: 1.026 (ref 1.005–1.030)
pH: 6 (ref 5.0–8.0)

## 2020-09-03 LAB — COMPREHENSIVE METABOLIC PANEL
ALT: 14 U/L (ref 0–44)
AST: 14 U/L — ABNORMAL LOW (ref 15–41)
Albumin: 3.3 g/dL — ABNORMAL LOW (ref 3.5–5.0)
Alkaline Phosphatase: 59 U/L (ref 38–126)
Anion gap: 14 (ref 5–15)
BUN: 12 mg/dL (ref 6–20)
CO2: 26 mmol/L (ref 22–32)
Calcium: 8.7 mg/dL — ABNORMAL LOW (ref 8.9–10.3)
Chloride: 96 mmol/L — ABNORMAL LOW (ref 98–111)
Creatinine, Ser: 0.99 mg/dL (ref 0.44–1.00)
GFR, Estimated: >60 mL/min (ref >60)
Glucose, Bld: 95 mg/dL (ref 70–99)
Potassium: 2.6 mmol/L — CL (ref 3.5–5.1)
Sodium: 136 mmol/L (ref 135–145)
Total Bilirubin: 0.6 mg/dL (ref 0.3–1.2)
Total Protein: 8.2 g/dL — ABNORMAL HIGH (ref 6.5–8.1)

## 2020-09-03 LAB — I-STAT BETA HCG BLOOD, ED (MC, WL, AP ONLY): I-stat hCG, quantitative: <5 m[IU]/mL (ref <5)

## 2020-09-03 LAB — WET PREP, GENITAL
Clue Cells Wet Prep HPF POC: NONE SEEN
Sperm: NONE SEEN
Trich, Wet Prep: NONE SEEN
WBC, Wet Prep HPF POC: NONE SEEN
Yeast Wet Prep HPF POC: NONE SEEN

## 2020-09-03 MED ORDER — POTASSIUM CHLORIDE CRYS ER 20 MEQ PO TBCR
40.0000 meq | EXTENDED_RELEASE_TABLET | ORAL | Status: AC
  Administered 2020-09-04 (×2): 40 meq via ORAL
  Filled 2020-09-03 (×2): qty 2

## 2020-09-03 MED ORDER — DESCOVY 200-25 MG PO TABS: 1.0000 | ORAL_TABLET | Freq: Every day | ORAL | 5 refills | Status: DC

## 2020-09-03 MED ORDER — MAGNESIUM SULFATE IN D5W 1-5 GM/100ML-% IV SOLN
1.0000 g | Freq: Once | INTRAVENOUS | Status: AC
  Administered 2020-09-04: 1 g via INTRAVENOUS
  Filled 2020-09-03: qty 100

## 2020-09-03 MED ORDER — POTASSIUM CHLORIDE 10 MEQ/100ML IV SOLN
10.0000 meq | Freq: Once | INTRAVENOUS | Status: AC
  Administered 2020-09-04: 10 meq via INTRAVENOUS
  Filled 2020-09-03: qty 100

## 2020-09-03 MED ORDER — PREZCOBIX 800-150 MG PO TABS: ORAL_TABLET | ORAL | 5 refills | Status: DC

## 2020-09-03 MED ORDER — SODIUM CHLORIDE 0.9 % IV BOLUS
1000.0000 mL | Freq: Once | INTRAVENOUS | Status: AC
Start: 2020-09-03 — End: 2020-09-04
  Administered 2020-09-03: 1000 mL via INTRAVENOUS

## 2020-09-03 NOTE — ED Triage Notes (Signed)
Patient c/o heavy vaginal bleeding with clots x 7 days.

## 2020-09-03 NOTE — ED Provider Notes (Signed)
[facility]-EMERGENCY DEPT Provider Note   CSN: [id number] Arrival date & time: 09/03/20  1315     History Chief Complaint  Patient presents with  . Vaginal Bleeding    Dawn [name] is a 50 y.o. female with a hx of HIV last CD4 396 & undetectable viral load, anemia due to chronic blood loss, fibroids, hypertension & prior tubal ligation, c-section, and D&C who presents to the ED with complaints of vaginal bleeding x 1 week. Patient states she has had waxing/waning vaginal bleeding for the past 7 days, started off heavy, eased off some then heaviness returned. States she is changing her pad once per hour, not utilizing tampons, and is passing some clots. She has associated pelvic cramping that is constant and has some mild irritation with urination which she thinks is more bleeding related. These sxs feel similar to prior related to her fibroids, she had been recommended to have a hysterectomy at some point. Not currently taking iron supplement or birth control. She also mentions she has had congestion, dry cough, and chest soreness from coughing for the past 1 week as well, has felt somewhat warm at times but no measured fever at home. Denies vomiting, abdominal pain, dyspnea, syncope, vaginal discharge, or being currently sexually active.   HPI     Past Medical History:  Diagnosis Date  . Anemia   . Anxiety   . Fibroid   . Headache(784.0)   . HIV (human immunodeficiency virus infection) (HCC)   . Infection    UTI    Patient Active Problem List   Diagnosis Date Noted  . Viral illness 01/08/2019  . HIV disease (HCC) 03/05/2017  . HTN (hypertension) 03/05/2017  . Anemia due to chronic blood loss 03/13/2016  . Acquired immunodeficiency syndrome (HCC) 12/07/2015  . Acne vulgaris 12/07/2015  . Episodic mood disorder (HCC) 12/07/2015    Past Surgical History:  Procedure Laterality Date  . CESAREAN SECTION    . DILATION AND CURETTAGE OF UTERUS    . HAND  TENDON SURGERY     right thumb  . INDUCED ABORTION    . TUBAL LIGATION       OB History    Gravida  6   Para  4   Term  4   Preterm      AB  2   Living  4     SAB  1   IAB  1   Ectopic      Multiple      Live Births  4           Family History  Problem Relation Age of Onset  . Breast cancer Neg Hx     Social History   Tobacco Use  . Smoking status: Former Smoker    Types: Cigarettes  . Smokeless tobacco: Never Used  Vaping Use  . Vaping Use: Never used  Substance Use Topics  . Alcohol use: Yes    Comment: hx of alcohol abuse- social drinker now  . Drug use: Not Currently    Types: Marijuana    Comment: none in 8 yrs    Home Medications Prior to Admission medications   Medication Sig Start Date End Date Taking? Authorizing Provider  albuterol (VENTOLIN HFA) 108 (90 Base) MCG/ACT inhaler Inhale 1-2 puffs into the lungs every 6 (six) hours as needed for wheezing or shortness of breath. 08/11/20  Yes Wieters, Hallie C, PA-C  benzonatate (TESSALON) 100 MG capsule Take 1 capsule (100  mg total) by mouth every 8 (eight) hours. 08/29/20  Yes Caccavale, Sophia, PA-C  darunavir-cobicistat (PREZCOBIX) 800-150 MG tablet TAKE ONE TABLET BY MOUTH DAILY WITH BREAKFAST. SWALLOW WHOLE. DO NOT CRUSH, BREAK, OR CHEW TABLETS 09/03/20  Yes Judyann Munson, MD  emtricitabine-tenofovir AF (DESCOVY) 200-25 MG tablet Take 1 tablet by mouth daily. 09/03/20  Yes Judyann Munson, MD  medroxyPROGESTERone (PROVERA) 10 MG tablet TAKE TWO TABLETS BY MOUTH DAILY Patient taking differently: Take by mouth once. 08/07/19  Yes Conan Bowens, MD  ondansetron (ZOFRAN) 4 MG tablet Take 1 tablet (4 mg total) by mouth every 8 (eight) hours as needed for nausea or vomiting. 08/29/20  Yes Caccavale, Sophia, PA-C  promethazine-dextromethorphan (PROMETHAZINE-DM) 6.25-15 MG/5ML syrup Take 5 mLs by mouth 4 (four) times daily as needed for cough. 08/29/20  Yes Caccavale, Sophia, PA-C   dextromethorphan-guaiFENesin (MUCINEX DM) 30-600 MG 12hr tablet Take 1 tablet by mouth 2 (two) times daily. Patient not taking: No sig reported 08/11/20   Wieters, Hallie C, PA-C  diphenhydrAMINE (BENADRYL) 25 MG tablet Take 1 tablet (25 mg total) by mouth every 6 (six) hours as needed for up to 3 days. 06/17/20 06/20/20  Orvil Feil, PA-C  famotidine (PEPCID) 20 MG tablet Take 1 tablet (20 mg total) by mouth 2 (two) times daily for 5 days. 06/17/20 06/22/20  Orvil Feil, PA-C  valACYclovir (VALTREX) 1000 MG tablet TAKE 1 TABLET(1000 MG) BY MOUTH DAILY Patient not taking: No sig reported 03/26/20   Judyann Munson, MD    Allergies    Chocolate and Tomato  Review of Systems   Review of Systems  Constitutional: Negative for fever.  Respiratory: Positive for cough. Negative for shortness of breath.   Cardiovascular: Positive for chest pain (soreness). Negative for leg swelling.  Gastrointestinal: Positive for abdominal pain (pelvic). Negative for blood in stool, constipation, diarrhea and vomiting.  Genitourinary: Positive for dysuria (irritation) and vaginal bleeding. Negative for vaginal discharge.  Neurological: Negative for dizziness, syncope and light-headedness.  All other systems reviewed and are negative.   Physical Exam Updated Vital Signs BP 133/77   Pulse (!) 106   Temp (!) 97.5 F (36.4 C) (Oral)   Resp 18   Ht 5\' 4"  (1.626 m)   Wt 76.7 kg   SpO2 100%   BMI 29.01 kg/m   Physical Exam Vitals and nursing note reviewed.  Constitutional:      General: She is not in acute distress.    Appearance: She is well-developed.  HENT:     Head: Normocephalic and atraumatic.     Right Ear: Ear canal normal. Tympanic membrane is not perforated, erythematous, retracted or bulging.     Left Ear: Ear canal normal. Tympanic membrane is not perforated, erythematous, retracted or bulging.     Ears:     Comments: No mastoid erythema/swelling/tenderness.     Nose:     Right  Sinus: No maxillary sinus tenderness or frontal sinus tenderness.     Left Sinus: No maxillary sinus tenderness or frontal sinus tenderness.     Mouth/Throat:     Pharynx: Uvula midline. No oropharyngeal exudate or posterior oropharyngeal erythema.     Comments: Posterior oropharynx is symmetric appearing. Patient tolerating own secretions without difficulty. No trismus. No drooling. No hot potato voice. No swelling beneath the tongue, submandibular compartment is soft.  Eyes:     General:        Right eye: No discharge.        Left eye:  No discharge.     Conjunctiva/sclera: Conjunctivae normal.     Pupils: Pupils are equal, round, and reactive to light.  Cardiovascular:     Rate and Rhythm: Regular rhythm. Tachycardia present.     Heart sounds: No murmur heard.   Pulmonary:     Effort: Pulmonary effort is normal. No respiratory distress.     Breath sounds: Normal breath sounds. No wheezing, rhonchi or rales.  Chest:     Chest wall: Tenderness (anterior chest wall which reproduces patient's chest discomfort) present.  Abdominal:     General: There is no distension.     Palpations: Abdomen is soft.     Tenderness: There is abdominal tenderness (mild suprapubic). There is no right CVA tenderness, left CVA tenderness, guarding or rebound.  Genitourinary:    Comments: No significant genital lesions noted.  Blood clot present in vaginal vault- coming from cervical os. No significant active bleeding noted.  No adnexal or cervical motion tenderness.  Musculoskeletal:     Cervical back: Normal range of motion and neck supple. No edema or rigidity.     Right lower leg: No edema.     Left lower leg: No edema.  Lymphadenopathy:     Cervical: No cervical adenopathy.  Skin:    General: Skin is warm and dry.     Findings: No rash.  Neurological:     Mental Status: She is alert.  Psychiatric:        Behavior: Behavior normal.     ED Results / Procedures / Treatments   Labs (all labs  ordered are listed, but only abnormal results are displayed) Labs Reviewed  COMPREHENSIVE METABOLIC PANEL - Abnormal; Notable for the following components:      Result Value   Potassium 2.6 (*)    Chloride 96 (*)    Calcium 8.7 (*)    Total Protein 8.2 (*)    Albumin 3.3 (*)    AST 14 (*)    All other components within normal limits  CBC WITH DIFFERENTIAL/PLATELET - Abnormal; Notable for the following components:   RBC 3.40 (*)    Hemoglobin 9.9 (*)    HCT 30.1 (*)    All other components within normal limits  URINALYSIS, ROUTINE W REFLEX MICROSCOPIC - Abnormal; Notable for the following components:   Color, Urine AMBER (*)    APPearance HAZY (*)    Hgb urine dipstick LARGE (*)    Ketones, ur 20 (*)    Protein, ur 100 (*)    RBC / HPF >50 (*)    All other components within normal limits  WET PREP, GENITAL  URINE CULTURE  I-STAT BETA HCG BLOOD, ED ([facility], [facility], [facility] ONLY)  GC/CHLAMYDIA PROBE AMP ([place]) NOT AT [facility]    EKG EKG Interpretation  Date/Time:  Saturday September 04 2020 00:27:59 EST Ventricular Rate:  95 PR Interval:    QRS Duration: 88 QT Interval:  354 QTC Calculation: 445 R Axis:   76 Text Interpretation: Sinus rhythm Probable anterior infarct, old No previous tracing Confirmed by Gilda Crease [phone]) on 09/04/2020 12:45:32 AM   Radiology DG Chest Portable 1 View  Result Date: 09/03/2020 CLINICAL DATA:  Cough. EXAM: PORTABLE CHEST 1 VIEW COMPARISON:  Radiograph 09/18/2020, 5 days prior. FINDINGS: Developing nodular opacity at the left lung base. Lungs are otherwise clear. Normal heart size and mediastinal contours. Mild biapical pleuroparenchymal scarring. No pleural fluid or pneumothorax. No pulmonary edema. No acute osseous abnormalities are seen. IMPRESSION: Developing nodular  opacity at the left lung base, may be infectious. Consider follow-up PA and lateral views after course of treatment to ensure resolution. Electronically Signed   By: Narda Rutherford M.D.   On: 09/03/2020 22:54    Procedures Procedures (including critical care time)  Medications Ordered in ED Medications - No data to display  ED Course  I have reviewed the triage vital signs and the nursing notes.  Pertinent labs & imaging results that were available during my care of the patient were reviewed by me and considered in my medical decision making (see chart for details).    MDM Rules/Calculators/A&P                         Patient presents to the ED with complaints of vaginal bleeding, has also had cough with additional sxs x 1 week. Nontoxic, vitals w/ mild tachycardia & elevated BP- doubt HTN emergency.   Additional history obtained:  Additional history obtained from chart review/nursing note review. Previous records obtained and reviewed:  Recent ED visit ED visit 08/30/19- covid/flu negative- had same respiratory sxs at that time per patient report.  Seen in the ED 02/2019 for vaginal bleeding, had a pelvic US @ that time:  1. Multiple uterine fibroids, some of which are calcified. The largest measures 4.3 cm in maximum diameter. Endometrial stripe is not identified due to prominent fibroid abutting the endometrial canal. 2. 3.0 cm simple cyst right ovary. Bilateral color flow noted to both ovaries, no evidence of torsion.   Lab Tests:  I Ordered, reviewed, and interpreted labs, which included:  Pregnancy test: Negative UA: blood present likely from menses, no obvious UTI. Ketonuria present- receiving fluids.  Wet prep: Negative  GC/Chlamydia: Pending  CBC: Anemia w/ hgb/hct 9.9/30.1- mildly decreased from 10.9/33.6 a few days ago.  CMP: Hypokalemia @ 2.6, mild hypoalbuminemia and hypocalcemia.   Imaging Studies ordered:  I ordered imaging studies which included CXR, I independently visualized and interpreted imaging which showed  Developing nodular opacity at the left lung base, may be infectious. Consider follow-up PA and lateral views after course  of treatment to ensure resolution.  In terms of vaginal bleeding- hgb/hct mildly decreased but not to the point of requiring blood transfusion in the ED. Preg test negative. No tenderness on bimanual exam, no discharge- not clinically consistent PID. No UTI. Known fibroids, hx of same in the past. Will restart iron supplementation & have patient follow up with GYN.   In terms of URI sxs- recent negative covid/flu testing. Exam is without signs of AOM, AOE, or mastoiditis. Oropharyngeal exam is benign.. No sinus tenderness. No meningeal signs. CXR w/ findings concerning for pneumonia, not in respiratory distress, not hypoxic, will tx w/ abx. Chest discomfort reproducible with chest wall palpation & coughing, likely MSK. Low risk wells- feel PE is less likely. EKG without findings of STEMI.   In terms of hypokalemia- received oral & IV potassium as well as IV magnesium. QTc 445. Will start potassium supplement to go home with.   HR improved on my re-assessment prior to discharge, currently 92 bpm, patient feels ready to go home.   I discussed results, treatment plan, need for follow-up, and return precautions with the patient. Provided opportunity for questions, patient confirmed understanding and is in agreement with plan.   Portions of this note were generated with Scientist, clinical (histocompatibility and immunogenetics). Dictation errors may occur despite best attempts at proofreading.  Final Clinical Impression(s) / ED Diagnoses  Final diagnoses:  Vaginal bleeding  Community acquired pneumonia of left lower lobe of lung  Hypokalemia    Rx / DC Orders ED Discharge Orders         Ordered    ferrous sulfate 325 (65 FE) MG tablet  Daily        09/04/20 0203    doxycycline (VIBRAMYCIN) 100 MG capsule  2 times daily        09/04/20 0203    amoxicillin-clavulanate (AUGMENTIN) 875-125 MG tablet  Every 12 hours        09/04/20 0203    potassium chloride SA (KLOR-CON) 20 MEQ tablet  Daily        09/04/20 0203    naproxen  (NAPROSYN) 375 MG tablet  2 times daily PRN        09/04/20 0203           Tayen Narang, Pleas Koch, PA-C 09/04/20 0231    Pricilla Loveless, MD 09/04/20 1500

## 2020-09-04 MED ORDER — FERROUS SULFATE 325 (65 FE) MG PO TABS: 325.0000 mg | ORAL_TABLET | Freq: Every day | ORAL | 0 refills | Status: DC

## 2020-09-04 MED ORDER — KETOROLAC TROMETHAMINE 15 MG/ML IJ SOLN
15.0000 mg | Freq: Once | INTRAMUSCULAR | Status: AC
  Administered 2020-09-04: 15 mg via INTRAVENOUS
  Filled 2020-09-04: qty 1

## 2020-09-04 MED ORDER — POTASSIUM CHLORIDE CRYS ER 20 MEQ PO TBCR: 20.0000 meq | EXTENDED_RELEASE_TABLET | Freq: Every day | ORAL | 0 refills | Status: DC

## 2020-09-04 MED ORDER — NAPROXEN 375 MG PO TABS: 375.0000 mg | ORAL_TABLET | Freq: Two times a day (BID) | ORAL | 0 refills | Status: DC | PRN

## 2020-09-04 MED ORDER — DOXYCYCLINE HYCLATE 100 MG PO CAPS: 100.0000 mg | ORAL_CAPSULE | Freq: Two times a day (BID) | ORAL | 0 refills | Status: DC

## 2020-09-04 MED ORDER — AMOXICILLIN-POT CLAVULANATE 875-125 MG PO TABS: 1.0000 | ORAL_TABLET | Freq: Two times a day (BID) | ORAL | 0 refills | Status: DC

## 2020-09-04 NOTE — Discharge Instructions (Signed)
You were seen in the emergency department today for vaginal bleeding and a cough.  Your blood work showed that you are mildly anemic this is likely from your vaginal bleeding, we would like you to start taking ferrous sulfate which is an iron supplement, please take this daily.  This sometimes can cause constipation, please take MiraLAX per over-the-counter dosing as needed.  We are sending you home with naproxen to help with pain in your chest as well as pain in the pelvic area.  - Naproxen is a nonsteroidal anti-inflammatory medication that will help with pain and swelling. Be sure to take this medication as prescribed with food, 1 pill every 12 hours,  It should be taken with food, as it can cause stomach upset, and more seriously, stomach bleeding. Do not take other nonsteroidal anti-inflammatory medications with this such as Advil, Motrin, Aleve, Mobic, Goodie Powder, or Motrin.    We would like you to follow-up closely with OB/GYN for your vaginal bleeding, please follow-up within 3 days.   Your blood work also showed that your potassium was low, you were given potassium in the emergency department we are sending him with a potassium supplement to take for a few days.  Please see attached diet guidelines to help include more potassium rich foods in your diet.   Your chest x-ray showed findings of pneumonia, we are sending you home with Augmentin and doxycycline, these are antibiotics to treat the infection.  You may need to have a repeat chest x-ray performed by your primary care provider to ensure this is resolved, please follow-up with them and discuss this within 1 to 2 weeks.  Like you to follow-up with your primary care provider within 3 days for a general recheck of your symptoms.  Return to the emergency department for new or worsening symptoms including but not limited to worsening bleeding, dizziness, lightheadedness, passing out, trouble breathing, fever, or any other concerns.

## 2020-09-05 LAB — URINE CULTURE: Culture: 40000 — AB

## 2020-09-06 ENCOUNTER — Other Ambulatory Visit: Payer: Self-pay | Admitting: Internal Medicine

## 2020-09-06 ENCOUNTER — Telehealth: Payer: Self-pay | Admitting: Emergency Medicine

## 2020-09-06 DIAGNOSIS — B009 Herpesviral infection, unspecified: Secondary | ICD-10-CM

## 2020-09-06 LAB — GC/CHLAMYDIA PROBE AMP (~~LOC~~) NOT AT ARMC
Chlamydia: NEGATIVE
Comment: NEGATIVE
Comment: NORMAL
Neisseria Gonorrhea: NEGATIVE

## 2020-09-06 NOTE — Telephone Encounter (Signed)
Post ED Visit - Positive Culture Follow-up  Culture report reviewed by antimicrobial stewardship pharmacist: [facility] Team []  Elenor Quinones, Pharm.D. []  Heide Guile, Pharm.D., BCPS AQ-ID []  Parks Neptune, Pharm.D., BCPS []  Alycia Rossetti, Pharm.D., BCPS []  [place], Pharm.D., BCPS, AAHIVP []  Legrand Como, Pharm.D., BCPS, AAHIVP []  Salome Arnt, PharmD, BCPS []  Johnnette Gourd, PharmD, BCPS []  Hughes Better, PharmD, BCPS []  Leeroy Cha, PharmD []  Laqueta Linden, PharmD, BCPS []  Albertina Parr, PharmD  [place] Team []  Leodis Sias, PharmD []  Lindell Spar, PharmD []  Royetta Asal, PharmD []  Graylin Shiver, Rph []  Rema Fendt) Glennon Mac, PharmD []  Arlyn Dunning, PharmD []  Netta Cedars, PharmD []  Dia Sitter, PharmD []  Leone Haven, PharmD []  Gretta Arab, PharmD []  Theodis Shove, PharmD []  Peggyann Juba, PharmD []  Reuel Boom, PharmD Jimmy Footman PharmD   Positive urine culture Treated with amoxicillin , organism sensitive to the same and no further patient follow-up is required at this time.  Hazle Nordmann 09/06/2020, 2:21 PM

## 2020-10-01 ENCOUNTER — Other Ambulatory Visit: Payer: Self-pay | Admitting: Otolaryngology

## 2020-10-01 DIAGNOSIS — E041 Nontoxic single thyroid nodule: Secondary | ICD-10-CM

## 2020-10-06 ENCOUNTER — Ambulatory Visit: Payer: Medicaid Other | Admitting: Obstetrics and Gynecology

## 2020-10-11 ENCOUNTER — Ambulatory Visit: Payer: Self-pay | Admitting: *Deleted

## 2020-10-11 NOTE — Telephone Encounter (Signed)
Pt called in c/o having vaginal bleeding that started yesterday.   "This has been going on a long time".   "I had a little blood clot this morning"   "I just don't know what to do"  (She was difficult to triage as she was making statements that did not match the situation and that she wasn't going to let a doctor cut on her since it was her first time doing surgery.   She might cut out my or something).   This was from a doctor she called I never was able to make out what she was referring to. I asked if she had called her doctor or ob-gyn.    "My doctor won't know what to do about this and my ob-gyn only deals with delivering babies".   "I have an appt in [facility] with a ob-gyn doctor later this month"  I recommended she go to the ED if she felt the vaginal bleeding was concerning or to call her doctor either her primary dr or ob-gyn. She was agreeable to calling her doctor.   "I just needed advice".   "Thank you for talking with me and Happy Valentine's Day".   Reason for Disposition . MODERATE vaginal bleeding (e.g., soaking 1 pad or tampon per hour and present > 6 hours; 1 menstrual cup every 6 hours)  Answer Assessment - Initial Assessment Questions 1. AMOUNT: "Describe the bleeding that you are having."    - SPOTTING: spotting, or pinkish / brownish mucous discharge; does not fill panti-liner or pad    - MILD:  less than 1 pad / hour; less than patient's usual menstrual bleeding   - MODERATE: 1-2 pads / hour; 1 menstrual cup every 6 hours; small-medium blood clots (e.g., pea, grape, small coin)   - SEVERE: soaking 2 or more pads/hour for 2 or more hours; 1 menstrual cup every 2 hours; bleeding not contained by pads or continuous red blood from vagina; large blood clots (e.g., golf ball, large coin)      I'm having vaginal bleeding.  I'm having blood clots.    I have an appt with a women's doctor in [facility].   I'm have an appt. I called the [facility] and they told me it was  her first time and I didn't want her cutting out the wrong thing.   2. ONSET: "When did the bleeding begin?" "Is it continuing now?"     Yesterday.   I had a little blood clot this morning.  3. MENSTRUAL PERIOD: "When was the last normal menstrual period?" "How is this different than your period?"     It was 3 yrs ago but this has been going on a long time.   4. REGULARITY: "How regular are your periods?"     *No Answer* 5. ABDOMINAL PAIN: "Do you have any pain?" "How bad is the pain?"  (e.g., Scale 1-10; mild, moderate, or severe)   - MILD (1-3): doesn't interfere with normal activities, abdomen soft and not tender to touch    - MODERATE (4-7): interferes with normal activities or awakens from sleep, tender to touch    - SEVERE (8-10): excruciating pain, doubled over, unable to do any normal activities      *No Answer* 6. PREGNANCY: "Could you be pregnant?" "Are you sexually active?" "Did you recently give birth?"     *No Answer* 7. BREASTFEEDING: "Are you breastfeeding?"     *No Answer* 8. HORMONES: "Are you taking any hormone  medications, prescription or OTC?" (e.g., birth control pills, estrogen)     *No Answer* 9. BLOOD THINNERS: "Do you take any blood thinners?" (e.g., Coumadin/warfarin, Pradaxa/dabigatran, aspirin)     *No Answer* 10. CAUSE: "What do you think is causing the bleeding?" (e.g., recent gyn surgery, recent gyn procedure; known bleeding disorder, cervical cancer, polycystic ovarian disease, fibroids)         *No Answer* 11. HEMODYNAMIC STATUS: "Are you weak or feeling lightheaded?" If Yes, ask: "Can you stand and walk normally?"        *No Answer* 12. OTHER SYMPTOMS: "What other symptoms are you having with the bleeding?" (e.g., passed tissue, vaginal discharge, fever, menstrual-type cramps)       *No Answer*  Protocols used: VAGINAL BLEEDING - ABNORMAL-A-AH

## 2020-12-08 ENCOUNTER — Other Ambulatory Visit: Payer: Self-pay | Admitting: Internal Medicine

## 2020-12-08 DIAGNOSIS — B009 Herpesviral infection, unspecified: Secondary | ICD-10-CM

## 2021-01-25 ENCOUNTER — Ambulatory Visit: Payer: Medicaid Other | Admitting: Internal Medicine

## 2021-02-02 ENCOUNTER — Other Ambulatory Visit (HOSPITAL_COMMUNITY): Payer: Self-pay

## 2021-02-02 ENCOUNTER — Other Ambulatory Visit: Payer: Self-pay

## 2021-02-02 ENCOUNTER — Ambulatory Visit (INDEPENDENT_AMBULATORY_CARE_PROVIDER_SITE_OTHER): Payer: Medicaid Other | Admitting: Internal Medicine

## 2021-02-02 ENCOUNTER — Encounter: Payer: Self-pay | Admitting: Internal Medicine

## 2021-02-02 VITALS — BP 147/91 | HR 79 | Wt 157.8 lb

## 2021-02-02 DIAGNOSIS — Z79899 Other long term (current) drug therapy: Secondary | ICD-10-CM

## 2021-02-02 DIAGNOSIS — B20 Human immunodeficiency virus [HIV] disease: Secondary | ICD-10-CM

## 2021-02-02 DIAGNOSIS — Z789 Other specified health status: Secondary | ICD-10-CM | POA: Diagnosis not present

## 2021-02-02 NOTE — Progress Notes (Signed)
RFV: follow up for hiv disease  Patient ID: Dawn [name], female   DOB: 1971-01-12, 50 y.o.   MRN: [id number]  HPI Dawn [name] is a 50yo F with HIV disease. On descovy-prezcobix. She takes regularly. She is having increasing financial difficulties.no issues with her health  Her food stamps went down since she is working-- only $50 per month. Has food instability; now has apartment.  Previously getting $842 down to $200 a month.  Outpatient Encounter Medications as of 02/02/2021  Medication Sig   darunavir-cobicistat (PREZCOBIX) 800-150 MG tablet TAKE ONE TABLET BY MOUTH DAILY WITH BREAKFAST. SWALLOW WHOLE. DO NOT CRUSH, BREAK, OR CHEW TABLETS   emtricitabine-tenofovir AF (DESCOVY) 200-25 MG tablet Take 1 tablet by mouth daily.   albuterol (VENTOLIN HFA) 108 (90 Base) MCG/ACT inhaler Inhale 1-2 puffs into the lungs every 6 (six) hours as needed for wheezing or shortness of breath.   amoxicillin-clavulanate (AUGMENTIN) 875-125 MG tablet Take 1 tablet by mouth every 12 (twelve) hours.   benzonatate (TESSALON) 100 MG capsule Take 1 capsule (100 mg total) by mouth every 8 (eight) hours.   doxycycline (VIBRAMYCIN) 100 MG capsule Take 1 capsule (100 mg total) by mouth 2 (two) times daily.   ferrous sulfate 325 (65 FE) MG tablet Take 1 tablet (325 mg total) by mouth daily.   medroxyPROGESTERone (PROVERA) 10 MG tablet TAKE TWO TABLETS BY MOUTH DAILY (Patient taking differently: Take by mouth once.)   naproxen (NAPROSYN) 375 MG tablet Take 1 tablet (375 mg total) by mouth 2 (two) times daily as needed.   ondansetron (ZOFRAN) 4 MG tablet Take 1 tablet (4 mg total) by mouth every 8 (eight) hours as needed for nausea or vomiting.   potassium chloride SA (KLOR-CON) 20 MEQ tablet Take 1 tablet (20 mEq total) by mouth daily.   promethazine-dextromethorphan (PROMETHAZINE-DM) 6.25-15 MG/5ML syrup Take 5 mLs by mouth 4 (four) times daily as needed for cough.   valACYclovir (VALTREX) 1000 MG tablet TAKE ONE  TABLET BY MOUTH DAILY   [DISCONTINUED] diphenhydrAMINE (BENADRYL) 25 MG tablet Take 1 tablet (25 mg total) by mouth every 6 (six) hours as needed for up to 3 days.   [DISCONTINUED] famotidine (PEPCID) 20 MG tablet Take 1 tablet (20 mg total) by mouth 2 (two) times daily for 5 days.   No facility-administered encounter medications on file as of 02/02/2021.     Patient Active Problem List   Diagnosis Date Noted   Viral illness 01/08/2019   HIV disease ([place]) 03/05/2017   HTN (hypertension) 03/05/2017   Anemia due to chronic blood loss 03/13/2016   Acquired immunodeficiency syndrome ([place]) 12/07/2015   Acne vulgaris 12/07/2015   Episodic mood disorder ([place]) 12/07/2015     Health Maintenance Due  Topic Date Due   Pneumococcal Vaccine 71-41 Years old (1 of 4 - PCV13) Never done   COLONOSCOPY (Pts 45-2yrs Insurance coverage will need to be confirmed)  Never done   COVID-19 Vaccine (2 - Pfizer risk 4-dose series) 08/16/2020     Review of Systems Review of Systems  Constitutional: Negative for fever, chills, diaphoresis, activity change, appetite change, fatigue and unexpected weight change.  HENT: Negative for congestion, sore throat, rhinorrhea, sneezing, trouble swallowing and sinus pressure.  Eyes: Negative for photophobia and visual disturbance.  Respiratory: Negative for cough, chest tightness, shortness of breath, wheezing and stridor.  Cardiovascular: Negative for chest pain, palpitations and leg swelling.  Gastrointestinal: Negative for nausea, vomiting, abdominal pain, diarrhea, constipation, blood in stool, abdominal distention and anal bleeding.  Genitourinary: Negative for dysuria, hematuria, flank pain and difficulty urinating.  Musculoskeletal: Negative for myalgias, back pain, joint swelling, arthralgias and gait problem.  Skin: Negative for color change, pallor, rash and wound.  Neurological: Negative for dizziness, tremors, weakness and light-headedness.  Hematological:  Negative for adenopathy. Does not bruise/bleed easily.  Psychiatric/Behavioral: Negative for behavioral problems, confusion, sleep disturbance, dysphoric mood, decreased concentration and agitation.   Physical Exam   BP (!) 147/91   Pulse 79   Wt 157 lb 12.8 oz (71.6 kg)   BMI 27.09 kg/m   Physical Exam  Constitutional:  oriented to person, place, and time. appears well-developed and well-nourished. No distress.  HENT: [place]/AT, PERRLA, no scleral icterus Mouth/Throat: Oropharynx is clear and moist. No oropharyngeal exudate.  Cardiovascular: Normal rate, regular rhythm and normal heart sounds. Exam reveals no gallop and no friction rub.  No murmur heard.  Pulmonary/Chest: Effort normal and breath sounds normal. No respiratory distress.  has no wheezes.  Neck = supple, no nuchal rigidity Abdominal: Soft. Bowel sounds are normal.  exhibits no distension. There is no tenderness.  Lymphadenopathy: no cervical adenopathy. No axillary adenopathy Neurological: alert and oriented to person, place, and time.  Skin: Skin is warm and dry. No rash noted. No erythema.  Psychiatric: a normal mood and affect.  behavior is normal.   Lab Results  Component Value Date   CD4TCELL 39 07/26/2020   Lab Results  Component Value Date   CD4TABS 396 (L) 07/26/2020   CD4TABS 447 07/02/2019   CD4TABS 420 02/04/2019   Lab Results  Component Value Date   HIV1RNAQUANT <20 07/26/2020   Lab Results  Component Value Date   HEPBSAB POS (A) 12/09/2015   Lab Results  Component Value Date   LABRPR NON-REACTIVE 07/26/2020    CBC Lab Results  Component Value Date   WBC 6.5 09/03/2020   RBC 3.40 (L) 09/03/2020   HGB 9.9 (L) 09/03/2020   HCT 30.1 (L) 09/03/2020   PLT 353 09/03/2020   MCV 88.5 09/03/2020   MCH 29.1 09/03/2020   MCHC 32.9 09/03/2020   RDW 13.2 09/03/2020   LYMPHSABS 1.3 09/03/2020   MONOABS 0.7 09/03/2020   EOSABS 0.0 09/03/2020    BMET Lab Results  Component Value Date   NA 136  09/03/2020   K 2.6 (LL) 09/03/2020   CL 96 (L) 09/03/2020   CO2 26 09/03/2020   GLUCOSE 95 09/03/2020   BUN 12 09/03/2020   CREATININE 0.99 09/03/2020   CALCIUM 8.7 (L) 09/03/2020   GFRNONAA >60 09/03/2020   GFRAA 84 07/26/2020      Assessment and Plan Food insecurity = will have her linked with community resource, [facility], as well as  Bus pass, food pantry  hiv disease = doing well with taking meds. Will do labs, cd 4 count and viral load to ensure she is undetectable . Would like to do lead in with medications and then switch to injectables  Long term medication management = will check cr to see still at baseline  Health maintenance =Needs mammo and gyn  Needs pfizer vaccine - to schedule vaccine

## 2021-02-03 LAB — T-HELPER CELL (CD4) - (RCID CLINIC ONLY)
CD4 % Helper T Cell: 42 % (ref 33–65)
CD4 T Cell Abs: 537 /uL (ref 400–1790)

## 2021-02-05 LAB — CBC WITH DIFFERENTIAL/PLATELET
Absolute Monocytes: 281 cells/uL (ref 200–950)
Basophils Absolute: 11 cells/uL (ref 0–200)
Basophils Relative: 0.3 %
Eosinophils Absolute: 40 cells/uL (ref 15–500)
Eosinophils Relative: 1.1 %
HCT: 36.2 % (ref 35.0–45.0)
Hemoglobin: 11.4 g/dL — ABNORMAL LOW (ref 11.7–15.5)
Lymphs Abs: 1400 cells/uL (ref 850–3900)
MCH: 28.2 pg (ref 27.0–33.0)
MCHC: 31.5 g/dL — ABNORMAL LOW (ref 32.0–36.0)
MCV: 89.6 fL (ref 80.0–100.0)
MPV: 11 fL (ref 7.5–12.5)
Monocytes Relative: 7.8 %
Neutro Abs: 1868 cells/uL (ref 1500–7800)
Neutrophils Relative %: 51.9 %
Platelets: 248 10*3/uL (ref 140–400)
RBC: 4.04 10*6/uL (ref 3.80–5.10)
RDW: 13.4 % (ref 11.0–15.0)
Total Lymphocyte: 38.9 %
WBC: 3.6 10*3/uL — ABNORMAL LOW (ref 3.8–10.8)

## 2021-02-05 LAB — COMPLETE METABOLIC PANEL WITH GFR
AG Ratio: 1.3 (calc) (ref 1.0–2.5)
ALT: 15 U/L (ref 6–29)
AST: 17 U/L (ref 10–35)
Albumin: 4.2 g/dL (ref 3.6–5.1)
Alkaline phosphatase (APISO): 58 U/L (ref 31–125)
BUN: 9 mg/dL (ref 7–25)
CO2: 27 mmol/L (ref 20–32)
Calcium: 9.3 mg/dL (ref 8.6–10.2)
Chloride: 105 mmol/L (ref 98–110)
Creat: 0.91 mg/dL (ref 0.50–1.10)
GFR, Est African American: 87 mL/min/{1.73_m2} (ref >=60)
GFR, Est Non African American: 75 mL/min/{1.73_m2} (ref >=60)
Globulin: 3.2 g/dL (calc) (ref 1.9–3.7)
Glucose, Bld: 85 mg/dL (ref 65–99)
Potassium: 3.7 mmol/L (ref 3.5–5.3)
Sodium: 139 mmol/L (ref 135–146)
Total Bilirubin: 0.6 mg/dL (ref 0.2–1.2)
Total Protein: 7.4 g/dL (ref 6.1–8.1)

## 2021-02-05 LAB — HIV-1 RNA QUANT-NO REFLEX-BLD
HIV 1 RNA Quant: <20 Copies/mL — ABNORMAL HIGH
HIV-1 RNA Quant, Log: <1.3 Log cps/mL — ABNORMAL HIGH

## 2021-02-05 LAB — RPR: RPR Ser Ql: NONREACTIVE

## 2021-02-16 ENCOUNTER — Other Ambulatory Visit: Payer: Self-pay | Admitting: Internal Medicine

## 2021-02-16 DIAGNOSIS — B20 Human immunodeficiency virus [HIV] disease: Secondary | ICD-10-CM

## 2021-02-16 NOTE — Telephone Encounter (Signed)
Dr Storm Frisk note mentions switching to oral lead in and then injectables. Please advise.

## 2021-02-17 NOTE — Telephone Encounter (Signed)
She hasn't mentioned anything to me about the switch yet, so I would refill please.   Thank you!

## 2021-02-18 ENCOUNTER — Telehealth: Payer: Self-pay

## 2021-02-18 ENCOUNTER — Other Ambulatory Visit (HOSPITAL_COMMUNITY): Payer: Self-pay

## 2021-02-18 NOTE — Telephone Encounter (Signed)
[facility] Patient Advocate Encounter  Kern Reap is covered under patients pharmacy plan. Prescription can be filled at [facility] and couriered over to the clinic for injection.  Ileene Patrick, [facility] Patient [facility] for Infectious Disease Phone: [phone] Fax:  [phone]

## 2021-02-18 NOTE — Telephone Encounter (Signed)
Have you been working on approval for [employer] for this patient? There are no orders, but she has a "first [place]" appointment scheduled. Thanks!

## 2021-02-18 NOTE — Telephone Encounter (Signed)
I have not worked on anything for this patient but I would see if her insurance pays for [place] and put some notes in [place].

## 2021-02-21 ENCOUNTER — Telehealth: Payer: Self-pay

## 2021-02-21 ENCOUNTER — Other Ambulatory Visit (HOSPITAL_COMMUNITY): Payer: Self-pay

## 2021-02-21 ENCOUNTER — Other Ambulatory Visit: Payer: Self-pay | Admitting: Pharmacist

## 2021-02-21 DIAGNOSIS — B20 Human immunodeficiency virus [HIV] disease: Secondary | ICD-10-CM

## 2021-02-21 MED ORDER — VOCABRIA 30 MG PO TABS: 30.0000 mg | ORAL_TABLET | Freq: Every day | ORAL | 0 refills | Status: DC

## 2021-02-21 MED ORDER — EDURANT 25 MG PO TABS: 25.0000 mg | ORAL_TABLET | Freq: Every day | ORAL | 0 refills | Status: DC

## 2021-02-21 MED ORDER — CABENUVA 600 & 900 MG/3ML IM SUER
1.0000 | INTRAMUSCULAR | 1 refills | Status: DC
  Filled 2021-02-21 (×2): qty 6, 30d supply, fill #0

## 2021-02-21 NOTE — Progress Notes (Signed)
Sending script in for patient's Cabenuva to be couriered over to [facility] from [facility] for her first injection appointment on 7/6.

## 2021-02-21 NOTE — Telephone Encounter (Signed)
Spoke with [employer] to cancel Descovy and Prezcobix. They confirm patient has not picked up the medications. Patient is being switched to [facility].   Beryle Flock, RN

## 2021-02-21 NOTE — Telephone Encounter (Signed)
Sorry misunderstanding. She is on oral lead in and will send Cabenuva injection to [facility] to courier here for her appt on 7/6.

## 2021-02-21 NOTE — Telephone Encounter (Signed)
Patient called requesting Megestrol prescription for her heavy vaginal bleeding. Patient was prescribed this in the past. I have advised patient she should reach out to GYN in regards to her heavy vaginal bleeding. She stated she was in the process of finding a new GYN and would like for [place] to send a prescription for her.  Dawn [name] T Brooks Sailors

## 2021-02-22 ENCOUNTER — Telehealth: Payer: Self-pay

## 2021-02-22 NOTE — Telephone Encounter (Signed)
[facility] Patient Advocate Encounter  Patient's medication Kern Reap) have been couriered to [facility] from [employer] [facility] and will be administered on patient next office visit on 03/02/21.  Ileene Patrick , [facility] Patient [facility] for Infectious Disease Phone: [phone] Fax:  [phone]

## 2021-03-02 ENCOUNTER — Encounter: Payer: Medicaid Other | Admitting: Internal Medicine

## 2021-03-04 ENCOUNTER — Telehealth: Payer: Self-pay

## 2021-03-04 NOTE — Telephone Encounter (Signed)
Returning patient's voicemail to reschedule missed Cabenuva injection. Patient did not answer. Left HIPAA compliant voicemail requesting callback.   Per Cassie, it would be best for patient to come in Monday 03/07/21.  Beryle Flock, RN

## 2021-03-10 ENCOUNTER — Other Ambulatory Visit: Payer: Self-pay | Admitting: Pharmacist

## 2021-03-10 ENCOUNTER — Telehealth: Payer: Self-pay | Admitting: Pharmacist

## 2021-03-10 DIAGNOSIS — B20 Human immunodeficiency virus [HIV] disease: Secondary | ICD-10-CM

## 2021-03-10 MED ORDER — DESCOVY 200-25 MG PO TABS: 1.0000 | ORAL_TABLET | Freq: Every day | ORAL | 5 refills | Status: DC

## 2021-03-10 MED ORDER — PREZCOBIX 800-150 MG PO TABS
1.0000 | ORAL_TABLET | Freq: Every day | ORAL | 5 refills | Status: DC
Start: 2021-03-10 — End: 2021-09-09

## 2021-03-10 NOTE — Telephone Encounter (Signed)
Patient was to start [facility] for HIV treatment. She started the oral lead-in on 6/8 and was scheduled to come in for her first injection on 7/6 but no showed her appointment. She then called back and wanted to reschedule but would not answer her phone when called to offer an appointment. She has been out of the oral lead-in for almost a week now. Will have patient see Dr. Flanagin Flattery to discuss if Kern Reap is still a good option for her since she has missed multiple appointments. She will need to restart her Prezcobix and Descovy until she can see Dr. Mckinzie Flattery again to discuss.

## 2021-03-10 NOTE — Telephone Encounter (Signed)
Patient returned call upset that she's having to restart her previous regimen. RN attempted to call her back but couldn't leave a VM as the mailbox is full. RN will try again later to explain that until she's seen by provider, she cannot remain on the oral cabenuva or start injections as she missed her target date multiple times.   Kaesyn Johnston Lorita Officer, RN

## 2021-03-11 NOTE — Telephone Encounter (Signed)
Patient returned call to discuss why she's going back on previous regimen. RN explained that after completing her oral lead in she's only allowed a few days to begin the injections. Patient missed multiple appointments due to transportation and health issues. RN explained that our office can help with transportation but until we can be sure she'll maintain the medication regimen, we cannot continue on Cabenuva. RN explained that she has a follow up with Dr. Reuter Flattery in September to discuss this further.   Patient repeatedly stated she hates taking pills and "there has to be a better way". Stated she might see a different doctor. RN asked for her to come to September to discuss the next steps.   Devantae Babe Lorita Officer, RN

## 2021-03-17 ENCOUNTER — Other Ambulatory Visit: Payer: Self-pay | Admitting: Family Medicine

## 2021-03-17 ENCOUNTER — Other Ambulatory Visit: Payer: Self-pay

## 2021-03-17 ENCOUNTER — Ambulatory Visit (INDEPENDENT_AMBULATORY_CARE_PROVIDER_SITE_OTHER): Payer: Medicaid Other | Admitting: Obstetrics and Gynecology

## 2021-03-17 ENCOUNTER — Encounter: Payer: Self-pay | Admitting: Obstetrics and Gynecology

## 2021-03-17 VITALS — BP 128/89 | HR 91 | Wt 159.3 lb

## 2021-03-17 DIAGNOSIS — Z1231 Encounter for screening mammogram for malignant neoplasm of breast: Secondary | ICD-10-CM

## 2021-03-17 DIAGNOSIS — N920 Excessive and frequent menstruation with regular cycle: Secondary | ICD-10-CM | POA: Diagnosis not present

## 2021-03-17 DIAGNOSIS — Z3202 Encounter for pregnancy test, result negative: Secondary | ICD-10-CM | POA: Diagnosis not present

## 2021-03-17 LAB — POCT PREGNANCY, URINE: Preg Test, Ur: NEGATIVE

## 2021-03-17 MED ORDER — MEGESTROL ACETATE 40 MG PO TABS: 40.0000 mg | ORAL_TABLET | Freq: Two times a day (BID) | ORAL | 5 refills | Status: DC

## 2021-03-17 NOTE — Progress Notes (Signed)
50 yo P4 with BMI 27 presenting today for evaluation of heavy menses. Patient reports a monthly period lasting 5-7 days heavy in flow with passage of clots. Patient admits to severe dysmenorrhea as well. Patient is without any other complaints. She is hoping for contraception management for now  Past Medical History:  Diagnosis Date   Anemia    Anxiety    Fibroid    Headache(784.0)    HIV (human immunodeficiency virus infection) ([place])    Infection    UTI   Past Surgical History:  Procedure Laterality Date   CESAREAN SECTION     DILATION AND CURETTAGE OF UTERUS     HAND TENDON SURGERY     right thumb   INDUCED ABORTION     TUBAL LIGATION     Family History  Problem Relation Age of Onset   Breast cancer Neg Hx    Social History   Tobacco Use   Smoking status: Former    Types: Cigarettes   Smokeless tobacco: Never  Vaping Use   Vaping Use: Never used  Substance Use Topics   Alcohol use: Yes    Comment: hx of alcohol abuse- social drinker now   Drug use: Not Currently    Types: Marijuana    Comment: none in 8 yrs   ROS See pertinent in HPI. All other systems reviewed and non contributory  Blood pressure 128/89, pulse 91, weight 159 lb 4.8 oz (72.3 kg). GENERAL: Well-developed, well-nourished female in no acute distress.  ABDOMEN: Soft, nontender, nondistended. No organomegaly. PELVIC: Patient declined as se is currently on her cycle EXTREMITIES: No cyanosis, clubbing, or edema, 2+ distal pulses.  A/P 50 yo with menorrhagia with regular cycles - pelvic ultrasound ordered - Rx megace provided - RTC in 1 month for results and further management  - Screening mammogram ordered

## 2021-03-21 ENCOUNTER — Other Ambulatory Visit: Payer: Self-pay | Admitting: Internal Medicine

## 2021-03-21 DIAGNOSIS — B009 Herpesviral infection, unspecified: Secondary | ICD-10-CM

## 2021-03-23 ENCOUNTER — Ambulatory Visit (HOSPITAL_BASED_OUTPATIENT_CLINIC_OR_DEPARTMENT_OTHER)
Admission: RE | Admit: 2021-03-23 | Discharge: 2021-03-23 | Disposition: A | Discharge status: Home / Self Care | Payer: Medicaid Other | Source: Ambulatory Visit | Attending: Obstetrics and Gynecology | Admitting: Obstetrics and Gynecology

## 2021-03-23 ENCOUNTER — Other Ambulatory Visit: Payer: Self-pay

## 2021-03-23 DIAGNOSIS — N920 Excessive and frequent menstruation with regular cycle: Secondary | ICD-10-CM | POA: Diagnosis present

## 2021-04-21 ENCOUNTER — Ambulatory Visit: Payer: Medicaid Other | Admitting: Obstetrics and Gynecology

## 2021-04-25 ENCOUNTER — Other Ambulatory Visit (HOSPITAL_COMMUNITY): Payer: Self-pay

## 2021-05-05 ENCOUNTER — Ambulatory Visit: Payer: Medicaid Other | Admitting: Internal Medicine

## 2021-05-10 ENCOUNTER — Ambulatory Visit: Payer: Medicaid Other

## 2021-05-23 ENCOUNTER — Other Ambulatory Visit: Payer: Self-pay | Admitting: Internal Medicine

## 2021-05-23 DIAGNOSIS — B009 Herpesviral infection, unspecified: Secondary | ICD-10-CM

## 2021-06-14 ENCOUNTER — Ambulatory Visit (HOSPITAL_COMMUNITY)
Admission: EM | Admit: 2021-06-14 | Discharge: 2021-06-14 | Disposition: A | Discharge status: Home / Self Care | Payer: Medicaid Other | Attending: Internal Medicine | Admitting: Internal Medicine

## 2021-06-14 ENCOUNTER — Encounter (HOSPITAL_COMMUNITY): Payer: Self-pay | Admitting: *Deleted

## 2021-06-14 ENCOUNTER — Other Ambulatory Visit: Payer: Self-pay

## 2021-06-14 DIAGNOSIS — Z87891 Personal history of nicotine dependence: Secondary | ICD-10-CM | POA: Diagnosis not present

## 2021-06-14 DIAGNOSIS — B9789 Other viral agents as the cause of diseases classified elsewhere: Secondary | ICD-10-CM | POA: Insufficient documentation

## 2021-06-14 DIAGNOSIS — Z7951 Long term (current) use of inhaled steroids: Secondary | ICD-10-CM | POA: Insufficient documentation

## 2021-06-14 DIAGNOSIS — U071 COVID-19: Secondary | ICD-10-CM | POA: Diagnosis present

## 2021-06-14 DIAGNOSIS — Z79899 Other long term (current) drug therapy: Secondary | ICD-10-CM | POA: Insufficient documentation

## 2021-06-14 DIAGNOSIS — J029 Acute pharyngitis, unspecified: Secondary | ICD-10-CM

## 2021-06-14 LAB — SARS CORONAVIRUS 2 (TAT 6-24 HRS): SARS Coronavirus 2: POSITIVE — AB

## 2021-06-14 MED ORDER — ALBUTEROL SULFATE HFA 108 (90 BASE) MCG/ACT IN AERS
1.0000 | INHALATION_SPRAY | Freq: Four times a day (QID) | RESPIRATORY_TRACT | 0 refills | Status: DC | PRN
Start: 2021-06-14 — End: 2024-03-07

## 2021-06-14 MED ORDER — BENZONATATE 100 MG PO CAPS: 100.0000 mg | ORAL_CAPSULE | Freq: Three times a day (TID) | ORAL | 0 refills | Status: DC | PRN

## 2021-06-14 MED ORDER — GUAIFENESIN ER 600 MG PO TB12: 600.0000 mg | ORAL_TABLET | Freq: Two times a day (BID) | ORAL | 0 refills | Status: AC

## 2021-06-14 NOTE — ED Triage Notes (Signed)
Pt reports she has been sick since the 6 th of October. Pt has a cough ,runny nose,wheezing .

## 2021-06-14 NOTE — Discharge Instructions (Signed)
Maintain adequate hydration Take medications as prescribed Warm salt water gargle Return to urgent care if symptoms worsen We will call you with recommendations if labs are abnormal.

## 2021-06-14 NOTE — ED Provider Notes (Signed)
[facility]    CSN: [id number] Arrival date & time: 06/14/21  1442      History   Chief Complaint Chief Complaint  Patient presents with   Nasal Congestion   Cough   Generalized Body Aches    HPI Dawn [name] is a 50 y.o. female comes to urgent care with runny nose, nonproductive cough and wheezing.  Patient's symptoms started about 2 weeks ago and is associated with sore throat..  She admits to having exposure to other sick people at work.  No difficulty breathing.  No chest pain or chest pressure.  No nausea, vomiting or diarrhea.  No weight loss.  Patient is compliant with her medications.  No headache or generalized body aches. HPI  Past Medical History:  Diagnosis Date   Anemia    Anxiety    Fibroid    Headache(784.0)    HIV (human immunodeficiency virus infection) ([place])    Infection    UTI    Patient Active Problem List   Diagnosis Date Noted   Viral illness 01/08/2019   HIV disease ([place]) 03/05/2017   HTN (hypertension) 03/05/2017   Anemia due to chronic blood loss 03/13/2016   Acquired immunodeficiency syndrome ([place]) 12/07/2015   Acne vulgaris 12/07/2015   Episodic mood disorder ([place]) 12/07/2015    Past Surgical History:  Procedure Laterality Date   CESAREAN SECTION     DILATION AND CURETTAGE OF UTERUS     HAND TENDON SURGERY     right thumb   INDUCED ABORTION     TUBAL LIGATION      OB History     Gravida  6   Para  4   Term  4   Preterm      AB  2   Living  4      SAB  1   IAB  1   Ectopic      Multiple      Live Births  4            Home Medications    Prior to Admission medications   Medication Sig Start Date End Date Taking? Authorizing Provider  albuterol (VENTOLIN HFA) 108 (90 Base) MCG/ACT inhaler Inhale 1 puff into the lungs every 6 (six) hours as needed for wheezing or shortness of breath. 06/14/21  Yes Lou Loewe, Myrene Galas, MD  benzonatate (TESSALON) 100 MG capsule Take 1 capsule (100 mg total) by  mouth 3 (three) times daily as needed for cough. 06/14/21  Yes Candelaria Pies, Myrene Galas, MD  guaiFENesin (MUCINEX) 600 MG 12 hr tablet Take 1 tablet (600 mg total) by mouth 2 (two) times daily for 15 days. 06/14/21 06/29/21 Yes Airyonna Franklyn, Myrene Galas, MD  amoxicillin-clavulanate (AUGMENTIN) 875-125 MG tablet Take 1 tablet by mouth every 12 (twelve) hours. 09/04/20   Petrucelli, Samantha R, PA-C  darunavir-cobicistat (PREZCOBIX) 800-150 MG tablet Take 1 tablet by mouth daily with breakfast. Swallow whole. Do NOT crush, break or chew tablets. Take with food. 03/10/21   Kuppelweiser, Cassie L, RPH-CPP  doxycycline (VIBRAMYCIN) 100 MG capsule Take 1 capsule (100 mg total) by mouth 2 (two) times daily. 09/04/20   Petrucelli, Samantha R, PA-C  emtricitabine-tenofovir AF (DESCOVY) 200-25 MG tablet Take 1 tablet by mouth daily. 03/10/21   Kuppelweiser, Cassie L, RPH-CPP  ferrous sulfate 325 (65 FE) MG tablet Take 1 tablet (325 mg total) by mouth daily. 09/04/20   Petrucelli, Glynda Jaeger, PA-C  megestrol (MEGACE) 40 MG tablet Take 1 tablet (40 mg total)  by mouth 2 (two) times daily. Can increase to two tablets twice a day in the event of heavy bleeding 03/17/21   Constant, Peggy, MD  ondansetron (ZOFRAN) 4 MG tablet Take 1 tablet (4 mg total) by mouth every 8 (eight) hours as needed for nausea or vomiting. Patient not taking: Reported on 03/17/2021 08/29/20   Caccavale, Sophia, PA-C  potassium chloride SA (KLOR-CON) 20 MEQ tablet Take 1 tablet (20 mEq total) by mouth daily. Patient not taking: Reported on 03/17/2021 09/04/20   Petrucelli, Glynda Jaeger, PA-C  promethazine-dextromethorphan (PROMETHAZINE-DM) 6.25-15 MG/5ML syrup Take 5 mLs by mouth 4 (four) times daily as needed for cough. Patient not taking: Reported on 03/17/2021 08/29/20   Caccavale, Sophia, PA-C  valACYclovir (VALTREX) 1000 MG tablet TAKE ONE TABLET BY MOUTH DAILY 05/23/21   Carlyle Basques, MD  diphenhydrAMINE (BENADRYL) 25 MG tablet Take 1 tablet (25 mg total) by mouth every  6 (six) hours as needed for up to 3 days. 06/17/20 09/04/20  Lannie Fields, PA-C  famotidine (PEPCID) 20 MG tablet Take 1 tablet (20 mg total) by mouth 2 (two) times daily for 5 days. 06/17/20 09/04/20  Lannie Fields, PA-C    Family History Family History  Problem Relation Age of Onset   Breast cancer Neg Hx     Social History Social History   Tobacco Use   Smoking status: Former    Types: Cigarettes   Smokeless tobacco: Never  Vaping Use   Vaping Use: Never used  Substance Use Topics   Alcohol use: Yes    Comment: hx of alcohol abuse- social drinker now   Drug use: Not Currently    Types: Marijuana    Comment: none in 8 yrs     Allergies   Chocolate and Tomato   Review of Systems Review of Systems As per HPI  Physical Exam Triage Vital Signs ED Triage Vitals  Enc Vitals Group     BP 06/14/21 1628 (!) 125/107     Pulse Rate 06/14/21 1628 69     Resp 06/14/21 1628 16     Temp 06/14/21 1628 98.9 F (37.2 C)     Temp src --      SpO2 06/14/21 1628 99 %     Weight --      Height --      Head Circumference --      Peak Flow --      Pain Score 06/14/21 1626 10     Pain Loc --      Pain Edu? --      Excl. in [place]? --    No data found.  Updated Vital Signs BP (!) 125/107   Pulse 69   Temp 98.9 F (37.2 C)   Resp 16   LMP  (LMP Unknown)   SpO2 99%   Visual Acuity Right Eye Distance:   Left Eye Distance:   Bilateral Distance:    Right Eye Near:   Left Eye Near:    Bilateral Near:     Physical Exam Vitals and nursing note reviewed.  Constitutional:      General: She is not in acute distress.    Appearance: She is not ill-appearing.  HENT:     Right Ear: Tympanic membrane normal.     Left Ear: Tympanic membrane normal.  Cardiovascular:     Rate and Rhythm: Normal rate and regular rhythm.     Pulses: Normal pulses.     Heart sounds: Normal heart sounds.  Pulmonary:     Effort: Pulmonary effort is normal.     Breath sounds: Normal breath  sounds.  Abdominal:     General: Bowel sounds are normal.     Palpations: Abdomen is soft.  Musculoskeletal:        General: Normal range of motion.  Neurological:     Mental Status: She is alert.     UC Treatments / Results  Labs (all labs ordered are listed, but only abnormal results are displayed) Labs Reviewed  SARS CORONAVIRUS 2 (TAT 6-24 HRS) - Abnormal; Notable for the following components:      Result Value   SARS Coronavirus 2 POSITIVE (*)    All other components within normal limits    EKG   Radiology No results found.  Procedures Procedures (including critical care time)  Medications Ordered in UC Medications - No data to display  Initial Impression / Assessment and Plan / UC Course  I have reviewed the triage vital signs and the nursing notes.  Pertinent labs & imaging results that were available during my care of the patient were reviewed by me and considered in my medical decision making (see chart for details).     1.  Acute viral pharyngitis: COVID-19 PCR test has been sent Patient is advised to quarantine until COVID-19 test results available Increase oral fluid intake Tessalon Perles as needed for cough Mucinex Albuterol inhaler Return to urgent care if symptoms worsen. Final Clinical Impressions(s) / UC Diagnoses   Final diagnoses:  Acute viral pharyngitis     Discharge Instructions      Maintain adequate hydration Take medications as prescribed Warm salt water gargle Return to urgent care if symptoms worsen We will call you with recommendations if labs are abnormal.     ED Prescriptions     Medication Sig Dispense Auth. Provider   benzonatate (TESSALON) 100 MG capsule Take 1 capsule (100 mg total) by mouth 3 (three) times daily as needed for cough. 21 capsule Jakobe Blau, Myrene Galas, MD   guaiFENesin (MUCINEX) 600 MG 12 hr tablet Take 1 tablet (600 mg total) by mouth 2 (two) times daily for 15 days. 30 tablet Daneya Hartgrove, Myrene Galas, MD    albuterol (VENTOLIN HFA) 108 (90 Base) MCG/ACT inhaler Inhale 1 puff into the lungs every 6 (six) hours as needed for wheezing or shortness of breath. 18 g Laylaa Guevarra, Myrene Galas, MD      PDMP not reviewed this encounter.   Chase Picket, MD 06/16/21 1520

## 2021-06-21 ENCOUNTER — Other Ambulatory Visit: Payer: Self-pay | Admitting: Internal Medicine

## 2021-06-21 DIAGNOSIS — B009 Herpesviral infection, unspecified: Secondary | ICD-10-CM

## 2021-07-06 ENCOUNTER — Encounter (HOSPITAL_COMMUNITY): Payer: Self-pay

## 2021-07-06 ENCOUNTER — Ambulatory Visit (HOSPITAL_COMMUNITY)
Admission: EM | Admit: 2021-07-06 | Discharge: 2021-07-06 | Disposition: A | Discharge status: Home / Self Care | Payer: Medicaid Other | Attending: Physician Assistant | Admitting: Physician Assistant

## 2021-07-06 ENCOUNTER — Ambulatory Visit (INDEPENDENT_AMBULATORY_CARE_PROVIDER_SITE_OTHER): Payer: Medicaid Other

## 2021-07-06 ENCOUNTER — Other Ambulatory Visit: Payer: Self-pay

## 2021-07-06 DIAGNOSIS — R531 Weakness: Secondary | ICD-10-CM

## 2021-07-06 DIAGNOSIS — R42 Dizziness and giddiness: Secondary | ICD-10-CM | POA: Diagnosis present

## 2021-07-06 DIAGNOSIS — R9389 Abnormal findings on diagnostic imaging of other specified body structures: Secondary | ICD-10-CM | POA: Diagnosis present

## 2021-07-06 DIAGNOSIS — Z8616 Personal history of COVID-19: Secondary | ICD-10-CM

## 2021-07-06 DIAGNOSIS — B20 Human immunodeficiency virus [HIV] disease: Secondary | ICD-10-CM | POA: Diagnosis not present

## 2021-07-06 DIAGNOSIS — R059 Cough, unspecified: Secondary | ICD-10-CM | POA: Diagnosis not present

## 2021-07-06 DIAGNOSIS — R112 Nausea with vomiting, unspecified: Secondary | ICD-10-CM | POA: Diagnosis present

## 2021-07-06 LAB — COMPREHENSIVE METABOLIC PANEL
ALT: 14 U/L (ref 0–44)
AST: 20 U/L (ref 15–41)
Albumin: 3.6 g/dL (ref 3.5–5.0)
Alkaline Phosphatase: 51 U/L (ref 38–126)
Anion gap: 10 (ref 5–15)
BUN: 11 mg/dL (ref 6–20)
CO2: 25 mmol/L (ref 22–32)
Calcium: 9.3 mg/dL (ref 8.9–10.3)
Chloride: 105 mmol/L (ref 98–111)
Creatinine, Ser: 1.4 mg/dL — ABNORMAL HIGH (ref 0.44–1.00)
GFR, Estimated: 46 mL/min — ABNORMAL LOW (ref >60)
Glucose, Bld: 82 mg/dL (ref 70–99)
Potassium: 3.7 mmol/L (ref 3.5–5.1)
Sodium: 140 mmol/L (ref 135–145)
Total Bilirubin: 0.5 mg/dL (ref 0.3–1.2)
Total Protein: 7.3 g/dL (ref 6.5–8.1)

## 2021-07-06 LAB — CBC WITH DIFFERENTIAL/PLATELET
Abs Immature Granulocytes: 0.01 10*3/uL (ref 0.00–0.07)
Basophils Absolute: 0 10*3/uL (ref 0.0–0.1)
Basophils Relative: 1 %
Eosinophils Absolute: 0.1 10*3/uL (ref 0.0–0.5)
Eosinophils Relative: 1 %
HCT: 36.8 % (ref 36.0–46.0)
Hemoglobin: 12 g/dL (ref 12.0–15.0)
Immature Granulocytes: 0 %
Lymphocytes Relative: 44 %
Lymphs Abs: 1.9 10*3/uL (ref 0.7–4.0)
MCH: 28.2 pg (ref 26.0–34.0)
MCHC: 32.6 g/dL (ref 30.0–36.0)
MCV: 86.6 fL (ref 80.0–100.0)
Monocytes Absolute: 0.4 10*3/uL (ref 0.1–1.0)
Monocytes Relative: 11 %
Neutro Abs: 1.8 10*3/uL (ref 1.7–7.7)
Neutrophils Relative %: 43 %
Platelets: 268 10*3/uL (ref 150–400)
RBC: 4.25 MIL/uL (ref 3.87–5.11)
RDW: 16.6 % — ABNORMAL HIGH (ref 11.5–15.5)
WBC: 4.2 10*3/uL (ref 4.0–10.5)
nRBC: 0 % (ref 0.0–0.2)

## 2021-07-06 LAB — POCT URINALYSIS DIPSTICK, ED / UC
Bilirubin Urine: NEGATIVE
Glucose, UA: NEGATIVE mg/dL
Ketones, ur: 15 mg/dL — AB
Leukocytes,Ua: NEGATIVE
Nitrite: NEGATIVE
Protein, ur: 30 mg/dL — AB
Specific Gravity, Urine: >=1.03 (ref 1.005–1.030)
Urobilinogen, UA: 0.2 mg/dL (ref 0.0–1.0)
pH: 5.5 (ref 5.0–8.0)

## 2021-07-06 LAB — POC INFLUENZA A AND B ANTIGEN (URGENT CARE ONLY)
INFLUENZA A ANTIGEN, POC: NEGATIVE
INFLUENZA B ANTIGEN, POC: NEGATIVE

## 2021-07-06 LAB — POC URINE PREG, ED: Preg Test, Ur: NEGATIVE

## 2021-07-06 MED ORDER — ONDANSETRON 4 MG PO TBDP: 4.0000 mg | ORAL_TABLET | Freq: Three times a day (TID) | ORAL | 0 refills | Status: DC | PRN

## 2021-07-06 NOTE — ED Triage Notes (Addendum)
Pt presents with complaints of body aches, emesis, and diarrhea x 3 days. Reports it is making her feel weak.  Pt also reports feeling fatigued and off balance for at least a month.

## 2021-07-06 NOTE — Discharge Instructions (Signed)
We will contact you if your lab work is abnormal.  I believe your symptoms are related to your recent COVID-19 infection.  Please rest and drink plenty of fluid.  Use Zofran every 8 hours as needed for nausea and vomiting.  Make sure you are eating small frequent meals.  We will contact you if your lab work is abnormal.  As we discussed, you need to follow-up with your primary care provider for a chest CT based on your chest x-ray today.  If you have any worsening symptoms including if you pass out, develop chest pain, shortness of breath, high fever, nausea/vomiting interfering with oral intake you need to go to the hospital.

## 2021-07-06 NOTE — ED Provider Notes (Signed)
[facility]    CSN: [id number] Arrival date & time: 07/06/21  1522      History   Chief Complaint Chief Complaint  Patient presents with   Emesis    HPI Dawn [name] is a 50 y.o. female.   Patient presents today with a weeklong history of persistent URI symptoms.  She was seen by our clinic on 06/14/2021 at which point she tested positive for COVID-19.  She reports symptoms did improve but never completely resolved.  She continues to have subjective fever, nausea/vomiting, fatigue, generalized weakness, body aches, dizziness, headache.  She denies any chest pain, shortness of breath.  She has not tried any over-the-counter medication for symptom management.  She describes dizziness as a room spinning sensation.  She does have a history of HIV and has AIDS listed on her chart, however, when asked by this patient reports that she has never been formally diagnosed with AIDS and is following with infectious disease regularly and symptoms are well controlled.  Denies any recent antibiotic use.  She is having difficulty with daily activities as result of symptoms.   Past Medical History:  Diagnosis Date   Anemia    Anxiety    Fibroid    Headache(784.0)    HIV (human immunodeficiency virus infection) ([place])    Infection    UTI    Patient Active Problem List   Diagnosis Date Noted   Viral illness 01/08/2019   HIV disease ([place]) 03/05/2017   HTN (hypertension) 03/05/2017   Anemia due to chronic blood loss 03/13/2016   Acquired immunodeficiency syndrome ([place]) 12/07/2015   Acne vulgaris 12/07/2015   Episodic mood disorder ([place]) 12/07/2015    Past Surgical History:  Procedure Laterality Date   CESAREAN SECTION     DILATION AND CURETTAGE OF UTERUS     HAND TENDON SURGERY     right thumb   INDUCED ABORTION     TUBAL LIGATION      OB History     Gravida  6   Para  4   Term  4   Preterm      AB  2   Living  4      SAB  1   IAB  1   Ectopic       Multiple      Live Births  4            Home Medications    Prior to Admission medications   Medication Sig Start Date End Date Taking? Authorizing Provider  ondansetron (ZOFRAN ODT) 4 MG disintegrating tablet Take 1 tablet (4 mg total) by mouth every 8 (eight) hours as needed for nausea or vomiting. 07/06/21  Yes Mallissa Lorenzen K, PA-C  albuterol (VENTOLIN HFA) 108 (90 Base) MCG/ACT inhaler Inhale 1 puff into the lungs every 6 (six) hours as needed for wheezing or shortness of breath. 06/14/21   Chase Picket, MD  amoxicillin-clavulanate (AUGMENTIN) 875-125 MG tablet Take 1 tablet by mouth every 12 (twelve) hours. 09/04/20   Petrucelli, Samantha R, PA-C  benzonatate (TESSALON) 100 MG capsule Take 1 capsule (100 mg total) by mouth 3 (three) times daily as needed for cough. 06/14/21   Lamptey, Myrene Galas, MD  darunavir-cobicistat (PREZCOBIX) 800-150 MG tablet Take 1 tablet by mouth daily with breakfast. Swallow whole. Do NOT crush, break or chew tablets. Take with food. 03/10/21   Kuppelweiser, Cassie L, RPH-CPP  doxycycline (VIBRAMYCIN) 100 MG capsule Take 1 capsule (100 mg total) by mouth  2 (two) times daily. 09/04/20   Petrucelli, Samantha R, PA-C  emtricitabine-tenofovir AF (DESCOVY) 200-25 MG tablet Take 1 tablet by mouth daily. 03/10/21   Kuppelweiser, Cassie L, RPH-CPP  ferrous sulfate 325 (65 FE) MG tablet Take 1 tablet (325 mg total) by mouth daily. 09/04/20   Petrucelli, Glynda Jaeger, PA-C  megestrol (MEGACE) 40 MG tablet Take 1 tablet (40 mg total) by mouth 2 (two) times daily. Can increase to two tablets twice a day in the event of heavy bleeding 03/17/21   Constant, Peggy, MD  potassium chloride SA (KLOR-CON) 20 MEQ tablet Take 1 tablet (20 mEq total) by mouth daily. Patient not taking: Reported on 03/17/2021 09/04/20   Petrucelli, Glynda Jaeger, PA-C  promethazine-dextromethorphan (PROMETHAZINE-DM) 6.25-15 MG/5ML syrup Take 5 mLs by mouth 4 (four) times daily as needed for cough. Patient not  taking: Reported on 03/17/2021 08/29/20   Caccavale, Sophia, PA-C  valACYclovir (VALTREX) 1000 MG tablet TAKE ONE TABLET BY MOUTH DAILY 06/21/21   Carlyle Basques, MD  diphenhydrAMINE (BENADRYL) 25 MG tablet Take 1 tablet (25 mg total) by mouth every 6 (six) hours as needed for up to 3 days. 06/17/20 09/04/20  Lannie Fields, PA-C  famotidine (PEPCID) 20 MG tablet Take 1 tablet (20 mg total) by mouth 2 (two) times daily for 5 days. 06/17/20 09/04/20  Lannie Fields, PA-C    Family History Family History  Problem Relation Age of Onset   Breast cancer Neg Hx     Social History Social History   Tobacco Use   Smoking status: Former    Types: Cigarettes   Smokeless tobacco: Never  Vaping Use   Vaping Use: Never used  Substance Use Topics   Alcohol use: Yes    Comment: hx of alcohol abuse- social drinker now   Drug use: Not Currently    Types: Marijuana    Comment: none in 8 yrs     Allergies   Chocolate and Tomato   Review of Systems Review of Systems  Constitutional:  Positive for activity change, fatigue and fever. Negative for appetite change.  HENT:  Negative for congestion, sinus pressure, sneezing and sore throat.   Respiratory:  Positive for cough. Negative for shortness of breath.   Cardiovascular:  Negative for chest pain.  Gastrointestinal:  Positive for abdominal pain, diarrhea, nausea and vomiting.  Genitourinary:  Positive for frequency. Negative for dysuria and urgency.  Musculoskeletal:  Positive for arthralgias and myalgias.  Neurological:  Positive for dizziness, weakness (generalized) and headaches. Negative for light-headedness.    Physical Exam Triage Vital Signs ED Triage Vitals  Enc Vitals Group     BP 07/06/21 1612 (!) 151/76     Pulse Rate 07/06/21 1612 84     Resp 07/06/21 1612 19     Temp 07/06/21 1612 98.5 F (36.9 C)     Temp src --      SpO2 07/06/21 1612 97 %     Weight --      Height --      Head Circumference --      Peak Flow --       Pain Score 07/06/21 1610 2     Pain Loc --      Pain Edu? --      Excl. in [place]? --    No data found.  Updated Vital Signs BP (!) 151/76   Pulse 84   Temp 98.5 F (36.9 C)   Resp 19   LMP  (LMP  Unknown)   SpO2 97%   Visual Acuity Right Eye Distance:   Left Eye Distance:   Bilateral Distance:    Right Eye Near:   Left Eye Near:    Bilateral Near:     Physical Exam Vitals reviewed.  Constitutional:      General: She is awake. She is not in acute distress.    Appearance: Normal appearance. She is well-developed. She is not ill-appearing.     Comments: Very pleasant female appears stated age no acute distress sitting comfortably in exam room  HENT:     Head: Normocephalic and atraumatic.     Right Ear: Tympanic membrane, ear canal and external ear normal. Tympanic membrane is not erythematous or bulging.     Left Ear: Tympanic membrane, ear canal and external ear normal. Tympanic membrane is not erythematous or bulging.     Nose:     Right Sinus: No maxillary sinus tenderness or frontal sinus tenderness.     Left Sinus: No maxillary sinus tenderness or frontal sinus tenderness.     Mouth/Throat:     Pharynx: Uvula midline. Posterior oropharyngeal erythema present. No oropharyngeal exudate.  Cardiovascular:     Rate and Rhythm: Normal rate and regular rhythm.     Heart sounds: Normal heart sounds, S1 normal and S2 normal. No murmur heard. Pulmonary:     Effort: Pulmonary effort is normal.     Breath sounds: Normal breath sounds. No wheezing, rhonchi or rales.     Comments: Clear to auscultation bilateral Abdominal:     General: Bowel sounds are normal.     Palpations: Abdomen is soft.     Tenderness: There is no abdominal tenderness. There is no right CVA tenderness, left CVA tenderness, guarding or rebound.  Psychiatric:        Behavior: Behavior is cooperative.     UC Treatments / Results  Labs (all labs ordered are listed, but only abnormal results are  displayed) Labs Reviewed  POCT URINALYSIS DIPSTICK, ED / UC - Abnormal; Notable for the following components:      Result Value   Ketones, ur 15 (*)    Hgb urine dipstick LARGE (*)    Protein, ur 30 (*)    All other components within normal limits  CBC WITH DIFFERENTIAL/PLATELET  COMPREHENSIVE METABOLIC PANEL  POC URINE PREG, ED  POC INFLUENZA A AND B ANTIGEN (URGENT CARE ONLY)    EKG   Radiology DG Chest 2 View  Result Date: 07/06/2021 CLINICAL DATA:  Cough x3 days, HIV EXAM: CHEST - 2 VIEW COMPARISON:  Chest x-ray 09/03/2020 FINDINGS: The heart and mediastinal contours are unchanged. Slightly less conspicuous left lower lobe nodular-like opacity. Biapical pleural/pulmonary scarring no focal consolidation. No pulmonary edema. No pleural effusion. No pneumothorax. No acute osseous abnormality. IMPRESSION: 1. No active cardiopulmonary disease. 2. Slightly less conspicuous left lower lobe nodular-like opacity. Given persistence, consider nonemergent CT chest with intravenous contrast for further evaluation. Electronically Signed   By: Iven Finn M.D.   On: 07/06/2021 16:56    Procedures Procedures (including critical care time)  Medications Ordered in UC Medications - No data to display  Initial Impression / Assessment and Plan / UC Course  I have reviewed the triage vital signs and the nursing notes.  Pertinent labs & imaging results that were available during my care of the patient were reviewed by me and considered in my medical decision making (see chart for details).     Vital signs and physical  exam reassuring today; no evidence of acute infection that warrant initiation of antibiotics.  Flu testing was negative.  UA showed hemoglobin without evidence of infection.  Discussed hemoglobin on urine with patient who believes this is related to stress cycle/bleeding from fibroids.  Discussed that this could be causing anemia which would explain her symptoms.  CBC and CMP were  obtained today-results pending.  She was instructed to follow-up with her OB/GYN/PCP for further evaluation and management.  Chest x-ray was obtained given recent history of COVID-19 and history of HIV.  X-ray showed no acute pulmonary disease but did show nodular opacity.  Discussed with patient that this requires CT follow-up with PCP.  Patient did have PCP listed in chart but upon questioning stated she does not have an established relationship with this provider.  Will establish care with someone via PCP assistance.  She is to rest and drink plenty of fluids.  Discussed alarm symptoms that warrant emergent evaluation to which she expressed understanding.  Strict return precautions given.  Final Clinical Impressions(s) / UC Diagnoses   Final diagnoses:  Generalized weakness  Episodic lightheadedness  Nausea and vomiting, unspecified vomiting type  Abnormal chest x-ray  History of COVID-19     Discharge Instructions      We will contact you if your lab work is abnormal.  I believe your symptoms are related to your recent COVID-19 infection.  Please rest and drink plenty of fluid.  Use Zofran every 8 hours as needed for nausea and vomiting.  Make sure you are eating small frequent meals.  We will contact you if your lab work is abnormal.  As we discussed, you need to follow-up with your primary care provider for a chest CT based on your chest x-ray today.  If you have any worsening symptoms including if you pass out, develop chest pain, shortness of breath, high fever, nausea/vomiting interfering with oral intake you need to go to the hospital.     ED Prescriptions     Medication Sig Dispense Auth. Provider   ondansetron (ZOFRAN ODT) 4 MG disintegrating tablet Take 1 tablet (4 mg total) by mouth every 8 (eight) hours as needed for nausea or vomiting. 20 tablet Shamona Wirtz, Derry Skill, PA-C      PDMP not reviewed this encounter.   Terrilee Croak, PA-C 07/06/21 1708

## 2021-07-07 ENCOUNTER — Telehealth: Payer: Self-pay

## 2021-07-07 ENCOUNTER — Other Ambulatory Visit: Payer: Self-pay | Admitting: Internal Medicine

## 2021-07-07 DIAGNOSIS — R911 Solitary pulmonary nodule: Secondary | ICD-10-CM

## 2021-07-07 NOTE — Telephone Encounter (Signed)
Error, Nurse will Call Patient back

## 2021-07-07 NOTE — Telephone Encounter (Signed)
Patient returned call, let her know that Dr. Kerstein Flattery has ordered a chest CT. She accepts appointment with Dr. Linus Salmons 11/15.   Beryle Flock, RN

## 2021-07-07 NOTE — Telephone Encounter (Signed)
Patient called front desk requesting appointment with Dr. Loya Flattery.   She tested positive for COVID 06/14/21 and feels that she has not improved since then.   She went back to urgent care yesterday for her symptoms and was told to follow up with her PCP for a chest CT.   Will route to provider.

## 2021-07-07 NOTE — Telephone Encounter (Signed)
Spoke with Dr. Briere Flattery, she has ordered a chest CT for the patient and would like for her to follow up with one of the providers next week.   Called patient to let her know and get her scheduled, no answer and voicemail greeting did not match patient's name.   Beryle Flock, RN

## 2021-07-11 ENCOUNTER — Other Ambulatory Visit: Payer: Self-pay

## 2021-07-11 ENCOUNTER — Encounter (HOSPITAL_COMMUNITY): Payer: Self-pay

## 2021-07-11 ENCOUNTER — Ambulatory Visit (HOSPITAL_COMMUNITY)
Admission: RE | Admit: 2021-07-11 | Discharge: 2021-07-11 | Disposition: A | Discharge status: Home / Self Care | Payer: Medicaid Other | Source: Ambulatory Visit | Attending: Internal Medicine | Admitting: Internal Medicine

## 2021-07-11 DIAGNOSIS — R911 Solitary pulmonary nodule: Secondary | ICD-10-CM | POA: Insufficient documentation

## 2021-07-12 ENCOUNTER — Other Ambulatory Visit: Payer: Self-pay

## 2021-07-12 ENCOUNTER — Encounter: Payer: Self-pay | Admitting: Internal Medicine

## 2021-07-12 ENCOUNTER — Ambulatory Visit: Payer: Medicaid Other | Admitting: Internal Medicine

## 2021-07-12 VITALS — BP 138/89 | HR 76 | Temp 98.7°F | Wt 163.0 lb

## 2021-07-12 DIAGNOSIS — Z23 Encounter for immunization: Secondary | ICD-10-CM

## 2021-07-12 DIAGNOSIS — B20 Human immunodeficiency virus [HIV] disease: Secondary | ICD-10-CM

## 2021-07-12 DIAGNOSIS — D219 Benign neoplasm of connective and other soft tissue, unspecified: Secondary | ICD-10-CM

## 2021-07-12 DIAGNOSIS — D500 Iron deficiency anemia secondary to blood loss (chronic): Secondary | ICD-10-CM | POA: Diagnosis not present

## 2021-07-12 NOTE — Assessment & Plan Note (Signed)
She reports doing well with her medication and I will check her labs today to confirm and if ok, she can rtc in 6 months with Dr. Delap Flattery.    40 minutes spent with the patient including 20 minutes in discussion of her issues.

## 2021-07-12 NOTE — Progress Notes (Signed)
   Subjective:    Patient ID: Dawn [name], female    DOB: 22-Jan-1971, 50 y.o.   MRN: [id number]  HPI She is here for a work in visit She called recently after recovering from [place] and was not feeling well still.  She went to urgent care and told to follow up with her PCP, which she does not have.  Her main complaint today is her fibroids.  She tells me she already has known fibroids and followed by gynecology.  She is having no current shortness of breath or other concerns.  She had a CT scan due to post covid complaints and no concerrning findings noted.     Review of Systems  Constitutional:  Negative for chills, fatigue, fever and unexpected weight change.  Gastrointestinal:  Negative for diarrhea and nausea.  Genitourinary:  Positive for menstrual problem.      Objective:   Physical Exam Eyes:     General: No scleral icterus. Pulmonary:     Effort: Pulmonary effort is normal.  Skin:    Findings: No rash.  Neurological:     General: No focal deficit present.     Mental Status: She is alert.  Psychiatric:        Mood and Affect: Mood normal.   SH: no current tobacco       Assessment & Plan:

## 2021-07-12 NOTE — Assessment & Plan Note (Signed)
By report she has fibroids and will go back to her gynecologist for further evaluation.

## 2021-07-12 NOTE — Assessment & Plan Note (Signed)
A recent Hgb is wnl and no significant concerns at this time.

## 2021-07-12 NOTE — Assessment & Plan Note (Signed)
Flu shot given today and discussed COVID vaccine Since she has had only one Pfizer vaccine, she will get the second one from a local pharmacy and the bivalent later.

## 2021-07-13 LAB — T-HELPER CELL (CD4) - (RCID CLINIC ONLY)
CD4 % Helper T Cell: 43 % (ref 33–65)
CD4 T Cell Abs: 746 /uL (ref 400–1790)

## 2021-07-14 LAB — HIV-1 RNA QUANT-NO REFLEX-BLD
HIV 1 RNA Quant: NOT DETECTED Copies/mL
HIV-1 RNA Quant, Log: NOT DETECTED Log cps/mL

## 2021-08-01 ENCOUNTER — Ambulatory Visit: Payer: Medicaid Other | Admitting: Internal Medicine

## 2021-08-02 ENCOUNTER — Ambulatory Visit: Payer: Medicaid Other | Admitting: Internal Medicine

## 2021-08-04 ENCOUNTER — Other Ambulatory Visit: Payer: Self-pay | Admitting: Internal Medicine

## 2021-08-04 DIAGNOSIS — B009 Herpesviral infection, unspecified: Secondary | ICD-10-CM

## 2021-09-08 ENCOUNTER — Other Ambulatory Visit: Payer: Self-pay

## 2021-09-08 ENCOUNTER — Other Ambulatory Visit: Payer: Self-pay | Admitting: Obstetrics and Gynecology

## 2021-09-08 ENCOUNTER — Other Ambulatory Visit: Payer: Self-pay | Admitting: Internal Medicine

## 2021-09-08 ENCOUNTER — Other Ambulatory Visit: Payer: Self-pay | Admitting: Pharmacist

## 2021-09-08 DIAGNOSIS — B009 Herpesviral infection, unspecified: Secondary | ICD-10-CM

## 2021-09-08 DIAGNOSIS — B20 Human immunodeficiency virus [HIV] disease: Secondary | ICD-10-CM

## 2021-10-10 ENCOUNTER — Telehealth: Payer: Self-pay

## 2021-10-10 NOTE — Telephone Encounter (Signed)
Patient called and left a message stating she is 5 months pregnant and is requesting an [place].  I attempted to call patient back with the phone number she left [phone]. I received a busy signal when I attempted to call the patient back.Triage please try to contact patient again and get her scheduled for an appointment ASAP.  Gibson Cande Mastropietro, CMA

## 2021-10-11 NOTE — Telephone Encounter (Signed)
Called patient on both numbers in chart and unable to reach patient or leave message. Will send MyChart message.  Beryle Flock, RN

## 2021-10-12 ENCOUNTER — Other Ambulatory Visit (HOSPITAL_COMMUNITY): Payer: Self-pay

## 2021-11-03 ENCOUNTER — Telehealth: Payer: Self-pay

## 2021-11-03 NOTE — Telephone Encounter (Signed)
Left voicemail for patient asking her to call our office for an appointment if she is unable to be seen by [facility] quickly.  ? ?Beryle Flock, RN ? ?

## 2021-11-03 NOTE — Telephone Encounter (Signed)
Patient called stating she needs help finding an OBGYN. Reports she has not had a period since 05-14-21, she cannot remember when her positive pregnancy test was.  ? ?She states she has been to the [facility] and [facility], as well as [facility], but that no one will see her because they say she is not pregnant.  ? ?She says she has been trying to find an OB, but that no one will accept her as a patient since she is HIV positive. Provided patient with the phone number for [facility] and advised she call them to set up a new patient appointment. Asked her to please call if she has any issues. Patient verbalized understanding and has no further questions.  ? ? ?Beryle Flock, RN ? ?

## 2021-11-03 NOTE — Telephone Encounter (Signed)
Patient called nurse phone and states she would like to make an appointment. I explained that I am unable to schedule appts but I can have the front office return call when they are open again. Patient states this appointment is for pregnancy. States she has tried to find care but has been told she is not pregnant. She believes this is because she has HIV. Patient is very concerned because she is late in pregnancy. I reassured patient she can receive care in our office.  ? ?After disconnecting with patient, chart review shows no confirmed pregnancy. Will call patient back and see if there is a time she can come into the office for pregnancy confirmation nurse visit. ?

## 2021-11-07 ENCOUNTER — Telehealth: Payer: Self-pay | Admitting: *Deleted

## 2021-11-07 NOTE — Telephone Encounter (Signed)
Called pt; pt agreeable to nurse visit tomorrow, 11/08/21 at 1530. Pt reports LMP of 05/14/22. States she had a positive UPT in September. ?

## 2021-11-07 NOTE — Telephone Encounter (Signed)
Spoke with patient, appears she spoke with a Marine scientist at [facility]. Advised patient to call back this morning to make an appointment for pregnancy confirmation. Asked her to please let our office know if OB is unable to see her in the next day or two. Patient verbalized understanding and has no further questions.  ? ?Beryle Flock, RN ? ?

## 2021-11-07 NOTE — Telephone Encounter (Signed)
TC form infectious disease. Pt is not a [facility] pt. Caller redirected to [place]. ?

## 2021-11-08 ENCOUNTER — Ambulatory Visit: Payer: Medicaid Other

## 2021-11-09 ENCOUNTER — Telehealth: Payer: Self-pay

## 2021-11-09 NOTE — Telephone Encounter (Signed)
Patient called nurse phone to ask where our office is located. Patient is at [facility] and is unable to get to our office for appt this afternoon. Would like to reschedule. Explained to patient I am with another patient and will need to call her back.  ?

## 2021-11-11 NOTE — Telephone Encounter (Signed)
Called patient to schedule appointment, no answer. Left HIPAA compliant voicemail requesting callback.   Jaysiah Marchetta D Dantrell Schertzer, RN  

## 2021-11-14 NOTE — Telephone Encounter (Signed)
Called patient to schedule appointment, no answer. Voicemail left with previous attempt.  ? ?Beryle Flock, RN ? ?

## 2021-11-17 NOTE — Telephone Encounter (Signed)
Called patient to schedule appointment, no answer. Left HIPAA compliant voicemail requesting callback.   Rex Oesterle D Kealan Buchan, RN  

## 2021-11-21 ENCOUNTER — Other Ambulatory Visit: Payer: Self-pay

## 2021-11-21 ENCOUNTER — Ambulatory Visit (INDEPENDENT_AMBULATORY_CARE_PROVIDER_SITE_OTHER): Payer: Medicaid Other

## 2021-11-21 VITALS — BP 159/93 | HR 85 | Wt 175.7 lb

## 2021-11-21 DIAGNOSIS — Z3200 Encounter for pregnancy test, result unknown: Secondary | ICD-10-CM

## 2021-11-21 DIAGNOSIS — Z3202 Encounter for pregnancy test, result negative: Secondary | ICD-10-CM | POA: Diagnosis not present

## 2021-11-21 LAB — POCT PREGNANCY, URINE: Preg Test, Ur: NEGATIVE

## 2021-11-21 NOTE — Patient Instructions (Addendum)
[place] ? ?[address] ? ? [phone] ?

## 2021-11-21 NOTE — Progress Notes (Signed)
Possible Pregnancy ? ?Here today for urine pregnancy test. UPT in office today is negative. Pt denies positive test at home. States she has not had menstrual period since September. Also, reports feeling something moving in her stomach and feeling a heartbeat. Pt reports hot flashes and states she is concerned she may be in menopause. History significant for BTL. Pt desires pregnancy. ? ?Recommended patient follow up with provider to discuss desired pregnancy and possible menopause. Also gave patient information for [facility] if she would like to pursue fertility treatment. Explained that ultimately our office cannot help her with BTL reversal or other needed fertility treatments required for her to become pregnant due to [place]. ? ?Pt to return to speak with provider June 2023; next available GYN provider appt scheduled. ? ?Annabell Howells, RN ?11/21/2021  10:19 AM ? ?

## 2021-12-26 ENCOUNTER — Encounter (HOSPITAL_COMMUNITY): Payer: Self-pay

## 2021-12-26 ENCOUNTER — Other Ambulatory Visit: Payer: Self-pay

## 2021-12-26 ENCOUNTER — Emergency Department (HOSPITAL_COMMUNITY): Payer: Commercial Managed Care - HMO

## 2021-12-26 ENCOUNTER — Emergency Department (HOSPITAL_COMMUNITY)
Admission: EM | Admit: 2021-12-26 | Discharge: 2021-12-26 | Disposition: A | Discharge status: Home / Self Care | Payer: Commercial Managed Care - HMO | Attending: Emergency Medicine | Admitting: Emergency Medicine

## 2021-12-26 DIAGNOSIS — Z21 Asymptomatic human immunodeficiency virus [HIV] infection status: Secondary | ICD-10-CM | POA: Insufficient documentation

## 2021-12-26 DIAGNOSIS — R0789 Other chest pain: Secondary | ICD-10-CM | POA: Diagnosis not present

## 2021-12-26 DIAGNOSIS — M79604 Pain in right leg: Secondary | ICD-10-CM | POA: Diagnosis not present

## 2021-12-26 DIAGNOSIS — Z20822 Contact with and (suspected) exposure to covid-19: Secondary | ICD-10-CM | POA: Diagnosis not present

## 2021-12-26 DIAGNOSIS — R197 Diarrhea, unspecified: Secondary | ICD-10-CM | POA: Diagnosis present

## 2021-12-26 DIAGNOSIS — M79605 Pain in left leg: Secondary | ICD-10-CM | POA: Diagnosis not present

## 2021-12-26 DIAGNOSIS — K529 Noninfective gastroenteritis and colitis, unspecified: Secondary | ICD-10-CM | POA: Diagnosis not present

## 2021-12-26 DIAGNOSIS — R7989 Other specified abnormal findings of blood chemistry: Secondary | ICD-10-CM | POA: Diagnosis not present

## 2021-12-26 DIAGNOSIS — I10 Essential (primary) hypertension: Secondary | ICD-10-CM | POA: Diagnosis not present

## 2021-12-26 LAB — URINALYSIS, ROUTINE W REFLEX MICROSCOPIC
Bilirubin Urine: NEGATIVE
Glucose, UA: NEGATIVE mg/dL
Hgb urine dipstick: NEGATIVE
Ketones, ur: NEGATIVE mg/dL
Leukocytes,Ua: NEGATIVE
Nitrite: NEGATIVE
Protein, ur: NEGATIVE mg/dL
Specific Gravity, Urine: 1.017 (ref 1.005–1.030)
pH: 5 (ref 5.0–8.0)

## 2021-12-26 LAB — CBC
HCT: 39.6 % (ref 36.0–46.0)
Hemoglobin: 13.1 g/dL (ref 12.0–15.0)
MCH: 29.8 pg (ref 26.0–34.0)
MCHC: 33.1 g/dL (ref 30.0–36.0)
MCV: 90 fL (ref 80.0–100.0)
Platelets: 285 10*3/uL (ref 150–400)
RBC: 4.4 MIL/uL (ref 3.87–5.11)
RDW: 14.6 % (ref 11.5–15.5)
WBC: 4.4 10*3/uL (ref 4.0–10.5)
nRBC: 0 % (ref 0.0–0.2)

## 2021-12-26 LAB — BASIC METABOLIC PANEL
Anion gap: 6 (ref 5–15)
BUN: 12 mg/dL (ref 6–20)
CO2: 27 mmol/L (ref 22–32)
Calcium: 9.7 mg/dL (ref 8.9–10.3)
Chloride: 107 mmol/L (ref 98–111)
Creatinine, Ser: 1.23 mg/dL — ABNORMAL HIGH (ref 0.44–1.00)
GFR, Estimated: 54 mL/min — ABNORMAL LOW (ref >60)
Glucose, Bld: 91 mg/dL (ref 70–99)
Potassium: 3.5 mmol/L (ref 3.5–5.1)
Sodium: 140 mmol/L (ref 135–145)

## 2021-12-26 LAB — TROPONIN I (HIGH SENSITIVITY)
Troponin I (High Sensitivity): 5 ng/L (ref <18)
Troponin I (High Sensitivity): 4 ng/L (ref <18)

## 2021-12-26 LAB — RESP PANEL BY RT-PCR (FLU A&B, COVID) ARPGX2
Influenza A by PCR: NEGATIVE
Influenza B by PCR: NEGATIVE
SARS Coronavirus 2 by RT PCR: NEGATIVE

## 2021-12-26 MED ORDER — DICYCLOMINE HCL 20 MG PO TABS: 20.0000 mg | ORAL_TABLET | Freq: Two times a day (BID) | ORAL | 0 refills | Status: DC

## 2021-12-26 MED ORDER — LACTATED RINGERS IV BOLUS
1000.0000 mL | Freq: Once | INTRAVENOUS | Status: AC
  Administered 2021-12-26: 1000 mL via INTRAVENOUS

## 2021-12-26 NOTE — ED Triage Notes (Signed)
Pt reports chest pressure, abd cramps and bilateral leg pain. Pt states "I just want yall to check me out" ?

## 2021-12-26 NOTE — Discharge Instructions (Addendum)
You were evaluated in the Emergency Department and after careful evaluation, we did not find any emergent condition requiring admission or further testing in the hospital. ? ?Your work-up today was overall reassuring.  Please continue to drink plenty of fluids.  You may use the Bentyl as needed for abdominal pain.  As discussed, it is very important that you take your HIV medications daily.  I would recommend that you follow-up with your infectious disease doctor soon after this ER visit. ? ?Please return to the Emergency Department if you experience any worsening of your condition.  We encourage you to follow up with a primary care provider.  Thank you for allowing [place] to be a part of your care. ? ?

## 2021-12-26 NOTE — ED Provider Triage Note (Signed)
Emergency Medicine Provider Triage Evaluation Note ? ?Dawn [name] , a 51 y.o. female  was evaluated in triage.  Pt complains of numerous complaints.  Patient complaining of chest tightness and shortness of breath, bilateral leg pain as well as sore throat.  Patient currently has HIV, unsure last CD4 count.  Patient reports she is compliant on medications.  Patient unsure when any of these symptoms began.  Patient very vague on examination.  Patient denies any recent fevers, nausea or vomiting. ? ?Review of Systems  ?Positive:  ?Negative:  ? ?Physical Exam  ?BP (!) 154/97 (BP Location: Right Arm)   Pulse 90   Temp 98.4 ?F (36.9 ?C) (Oral)   Resp 17   SpO2 100%  ?Gen:   Awake, no distress   ?Resp:  Normal effort  ?MSK:   Moves extremities without difficulty  ?Other:  Neurologically intact.  Lungs clear.  Abdomen soft compressible. ? ?Medical Decision Making  ?Medically screening exam initiated at 2:53 PM.  Appropriate orders placed.  Dawn [name] was informed that the remainder of the evaluation will be completed by another provider, this initial triage assessment does not replace that evaluation, and the importance of remaining in the ED until their evaluation is complete. ? ? ?  ?Dawn Cecil, PA-C ?12/26/21 1454 ? ?

## 2021-12-26 NOTE — ED Provider Notes (Signed)
?[facility] [place] ?Provider Note ? ? ?CSN: [id number] ?Arrival date & time: 12/26/21  1327 ? ?  ? ?History ? ?Chief Complaint  ?Patient presents with  ? Chest Pain  ? ? ?Dawn [name] is a 51 y.o. female. ? ?HPI ?51 year old female with a history of HIV, hypertension, anxiety, anemia presents to the ER with multiple complaints.  Patient states that for the last several days she has felt chest pressure, abdominal cramps with intermittent diarrhea and bilateral leg pain.  She reports stiffness in her joints when she gets up out of the chai especially in the AM. The joint stiffness has been ongoing for months.  She also states that she has had some abdominal cramping and intermittent diarrhea over the last several days which seems to resolve on its own.  She also complains of chest pressure, no shortness of breath.  No dizziness, no syncope.  She reports that she has not been taking her HIV medication every day but rather intermittently.  States that she is "tired of taking it every day" but states that she plans on restarting a daily after today.  She denies any known fevers or chills. ?  ? ?Home Medications ?Prior to Admission medications   ?Medication Sig Start Date End Date Taking? Authorizing Provider  ?dicyclomine (BENTYL) 20 MG tablet Take 1 tablet (20 mg total) by mouth 2 (two) times daily for 3 days. 12/26/21 12/29/21 Yes Garald Balding, PA-C  ?albuterol (VENTOLIN HFA) 108 (90 Base) MCG/ACT inhaler Inhale 1 puff into the lungs every 6 (six) hours as needed for wheezing or shortness of breath. 06/14/21   Chase Picket, MD  ?DESCOVY 200-25 MG tablet TAKE ONE TABLET BY MOUTH DAILY 09/09/21   Carlyle Basques, MD  ?ferrous sulfate 325 (65 FE) MG tablet Take 1 tablet (325 mg total) by mouth daily. 09/04/20   Petrucelli, Samantha R, PA-C  ?megestrol (MEGACE) 40 MG tablet Take 1 tablet (40 mg total) by mouth 2 (two) times daily. Can increase to two tablets twice a day in the event of  heavy bleeding 03/17/21   Constant, Peggy, MD  ?ondansetron (ZOFRAN ODT) 4 MG disintegrating tablet Take 1 tablet (4 mg total) by mouth every 8 (eight) hours as needed for nausea or vomiting. ?Patient not taking: Reported on 11/21/2021 07/06/21   Raspet, Junie Panning K, PA-C  ?potassium chloride SA (KLOR-CON) 20 MEQ tablet Take 1 tablet (20 mEq total) by mouth daily. ?Patient not taking: Reported on 03/17/2021 09/04/20   Amaryllis Dyke, PA-C  ?PREZCOBIX 800-150 MG tablet TAKE ONE TABLET BY MOUTH DAILY WITH BREAKFAST *SWALLOW WHOLE. DO NOT CRUSH, BREAK OR CHEW TABLETS* 09/09/21   Carlyle Basques, MD  ?promethazine-dextromethorphan (PROMETHAZINE-DM) 6.25-15 MG/5ML syrup Take 5 mLs by mouth 4 (four) times daily as needed for cough. ?Patient not taking: Reported on 03/17/2021 08/29/20   Caccavale, Sophia, PA-C  ?valACYclovir (VALTREX) 1000 MG tablet TAKE ONE TABLET BY MOUTH DAILY 09/09/21   Carlyle Basques, MD  ?diphenhydrAMINE (BENADRYL) 25 MG tablet Take 1 tablet (25 mg total) by mouth every 6 (six) hours as needed for up to 3 days. 06/17/20 09/04/20  Lannie Fields, PA-C  ?famotidine (PEPCID) 20 MG tablet Take 1 tablet (20 mg total) by mouth 2 (two) times daily for 5 days. 06/17/20 09/04/20  Lannie Fields, PA-C  ?   ? ?Allergies    ?Chocolate and Tomato   ? ?Review of Systems   ?Review of Systems ?Ten systems reviewed and are negative  for acute change, except as noted in the HPI.  ? ?Physical Exam ?Updated Vital Signs ?BP (!) 144/86   Pulse 71   Temp 98 ?F (36.7 ?C)   Resp 14   Ht '5\' 4"'$  (1.626 m)   Wt 77.1 kg   LMP 11/10/2021 (Approximate)   SpO2 98%   BMI 29.18 kg/m?  ?Physical Exam ?Vitals and nursing note reviewed.  ?Constitutional:   ?   General: She is not in acute distress. ?   Appearance: She is well-developed. She is not ill-appearing or diaphoretic.  ?HENT:  ?   Head: Normocephalic and atraumatic.  ?Eyes:  ?   Conjunctiva/sclera: Conjunctivae normal.  ?Cardiovascular:  ?   Rate and Rhythm: Normal rate and  regular rhythm.  ?   Heart sounds: No murmur heard. ?Pulmonary:  ?   Effort: Pulmonary effort is normal. No respiratory distress.  ?   Breath sounds: Normal breath sounds.  ?Chest:  ?   Chest wall: No tenderness.  ?Abdominal:  ?   Palpations: Abdomen is soft.  ?   Tenderness: There is no abdominal tenderness.  ?Musculoskeletal:     ?   General: No swelling.  ?   Cervical back: Neck supple.  ?   Right lower leg: No edema.  ?   Left lower leg: No edema.  ?   Comments: 2+ DP pulses, no lower extremity edema bilaterally, no warmth, no overlying erythema, compartments are soft  ?Skin: ?   General: Skin is warm and dry.  ?   Capillary Refill: Capillary refill takes less than 2 seconds.  ?Neurological:  ?   General: No focal deficit present.  ?   Mental Status: She is alert.  ?Psychiatric:     ?   Mood and Affect: Mood normal.  ? ? ?ED Results / Procedures / Treatments   ?Labs ?(all labs ordered are listed, but only abnormal results are displayed) ?Labs Reviewed  ?BASIC METABOLIC PANEL - Abnormal; Notable for the following components:  ?    Result Value  ? Creatinine, Ser 1.23 (*)   ? GFR, Estimated 54 (*)   ? All other components within normal limits  ?RESP PANEL BY RT-PCR (FLU A&B, COVID) ARPGX2  ?CBC  ?URINALYSIS, ROUTINE W REFLEX MICROSCOPIC  ?T-HELPER CELLS (CD4) COUNT (NOT AT [facility])  ?TROPONIN I (HIGH SENSITIVITY)  ?TROPONIN I (HIGH SENSITIVITY)  ? ? ?EKG ?None ? ?Radiology ?DG Chest 1 View ? ?Result Date: 12/26/2021 ?CLINICAL DATA:  Midline to left-sided chest pain and pressure, intermittent shortness of breath EXAM: CHEST  1 VIEW COMPARISON:  07/06/2021 FINDINGS: Single frontal view of the chest demonstrates an unremarkable cardiac silhouette. No acute airspace disease, effusion, or pneumothorax. No acute bony abnormalities. IMPRESSION: 1. No acute intrathoracic process. Electronically Signed   By: Randa Ngo M.D.   On: 12/26/2021 15:34   ? ?Procedures ?Procedures  ? ? ?Medications Ordered in ED ?Medications   ?lactated ringers bolus 1,000 mL (1,000 mLs Intravenous New Bag/Given 12/26/21 1909)  ? ? ?ED Course/ Medical Decision Making/ A&P ?  ?                        ?Medical Decision Making ?Amount and/or Complexity of Data Reviewed ?Labs: ordered. ? ? ?This patient presents to the ED for concern of abdominal cramping, diarrhea, chest pressure and bilateral leg pain, this involves a number of treatment options, and is a complaint that carries with it a a risk of complications  and morbidity.  The differential diagnosis includes viral gastroenteritis, ACS,  viral infection, PE, pneumonia, ACS, PE, PTX, pneumonia, DVT, cellulitis, compartment syndrome ? ? ?Co morbidities: ?Discussed in HPI ? ? ?Brief History: ?51 year old female presenting with several days of chest pressure, abdominal cramping, diarrhea and bilateral leg pain.  Bilateral leg pain has been ongoing for quite some time.  Patient states it is worse in the mornings when she wakes up and she feels stiff in her joints.  She endorses some chest pressure but cannot characterize the pain.  It does not radiate.  Shortness of breath.  No syncopal episodes.  He denies any cough ? ? ?EMR reviewed including pt PMHx, past surgical history and past visits to ER.  ? ?See HPI for more details ? ? ?Lab Tests: ? ?I ordered and independently interpreted labs.  The pertinent results include:   ? ?I personally reviewed and interpreted her labs.  BMP without any significant electrode abnormalities, creatinine slightly elevated from baseline, question dehydration.  CBC without leukocytosis UA without any evidence of infection.  Delta troponins negative.  COVID and flu are negative.  CD4 sent however like will not result during this the ER visit. ? ?Imaging Studies: ? ?NAD. I personally reviewed all imaging studies and no acute abnormality found. I agree with radiology interpretation. ? ? ? ?Cardiac Monitoring: ? ?The patient was maintained on a cardiac monitor.  I personally viewed  and interpreted the cardiac monitored which showed an underlying rhythm of: ?EKG non-ischemic ? ? ?Medicines ordered: ? ?I ordered medication including LR bolus ?Reevaluation of the patient after these medicines showed that

## 2021-12-28 LAB — HELPER T-LYMPH-CD4 (ARMC ONLY)
% CD 4 Pos. Lymph.: 44.4 % (ref 30.8–58.5)
Absolute CD 4 Helper: 844 /uL (ref 359–1519)
Basophils Absolute: 0 10*3/uL (ref 0.0–0.2)
Basos: 1 %
EOS (ABSOLUTE): 0 10*3/uL (ref 0.0–0.4)
Eos: 1 %
Hematocrit: 41.8 % (ref 34.0–46.6)
Hemoglobin: 13.3 g/dL (ref 11.1–15.9)
Immature Grans (Abs): 0 10*3/uL (ref 0.0–0.1)
Immature Granulocytes: 0 %
Lymphocytes Absolute: 1.9 10*3/uL (ref 0.7–3.1)
Lymphs: 45 %
MCH: 29.2 pg (ref 26.6–33.0)
MCHC: 31.8 g/dL (ref 31.5–35.7)
MCV: 92 fL (ref 79–97)
Monocytes Absolute: 0.5 10*3/uL (ref 0.1–0.9)
Monocytes: 12 %
Neutrophils Absolute: 1.7 10*3/uL (ref 1.4–7.0)
Neutrophils: 41 %
Platelets: 293 10*3/uL (ref 150–450)
RBC: 4.56 x10E6/uL (ref 3.77–5.28)
RDW: 13.6 % (ref 11.7–15.4)
WBC: 4.1 10*3/uL (ref 3.4–10.8)

## 2021-12-29 LAB — T-HELPER CELLS (CD4) COUNT (NOT AT ARMC)

## 2022-01-10 ENCOUNTER — Ambulatory Visit (INDEPENDENT_AMBULATORY_CARE_PROVIDER_SITE_OTHER): Payer: Medicaid Other | Admitting: Internal Medicine

## 2022-01-10 ENCOUNTER — Other Ambulatory Visit (HOSPITAL_COMMUNITY)
Admission: RE | Admit: 2022-01-10 | Discharge: 2022-01-10 | Disposition: A | Discharge status: Home / Self Care | Payer: Medicaid Other | Source: Ambulatory Visit | Attending: Internal Medicine | Admitting: Internal Medicine

## 2022-01-10 ENCOUNTER — Other Ambulatory Visit: Payer: Self-pay

## 2022-01-10 ENCOUNTER — Encounter: Payer: Self-pay | Admitting: Internal Medicine

## 2022-01-10 ENCOUNTER — Other Ambulatory Visit: Payer: Self-pay | Admitting: Pharmacist

## 2022-01-10 VITALS — BP 151/84 | HR 78 | Temp 98.3°F | Wt 178.0 lb

## 2022-01-10 DIAGNOSIS — R030 Elevated blood-pressure reading, without diagnosis of hypertension: Secondary | ICD-10-CM

## 2022-01-10 DIAGNOSIS — Z789 Other specified health status: Secondary | ICD-10-CM | POA: Diagnosis not present

## 2022-01-10 DIAGNOSIS — B20 Human immunodeficiency virus [HIV] disease: Secondary | ICD-10-CM

## 2022-01-10 DIAGNOSIS — Z79899 Other long term (current) drug therapy: Secondary | ICD-10-CM

## 2022-01-10 NOTE — Progress Notes (Signed)
? ? ?Patient ID: Dawn [name], female   DOB: 1971/05/20, 51 y.o.   MRN: [id number] ? ?HPI ?51yo F with hiv disease, CD 4 count of 746/VL<20 (in nov 2022) on descovy-prezcobix. No recent hiv genotype. She reports missing 1-2 weeks of medications.now improved. Interested in injectables. ? ?Menorrhagia - "endometrial stripping" better - sees dr Ilda Basset in early June. Patient reports that she doesn't have any recent menstrual cycle. ? ?Outpatient Encounter Medications as of 01/10/2022  ?Medication Sig  ? albuterol (VENTOLIN HFA) 108 (90 Base) MCG/ACT inhaler Inhale 1 puff into the lungs every 6 (six) hours as needed for wheezing or shortness of breath.  ? DESCOVY 200-25 MG tablet TAKE ONE TABLET BY MOUTH DAILY  ? ferrous sulfate 325 (65 FE) MG tablet Take 1 tablet (325 mg total) by mouth daily.  ? megestrol (MEGACE) 40 MG tablet Take 1 tablet (40 mg total) by mouth 2 (two) times daily. Can increase to two tablets twice a day in the event of heavy bleeding  ? PREZCOBIX 800-150 MG tablet TAKE ONE TABLET BY MOUTH DAILY WITH BREAKFAST *SWALLOW WHOLE. DO NOT CRUSH, BREAK OR CHEW TABLETS*  ? valACYclovir (VALTREX) 1000 MG tablet TAKE ONE TABLET BY MOUTH DAILY  ? dicyclomine (BENTYL) 20 MG tablet Take 1 tablet (20 mg total) by mouth 2 (two) times daily for 3 days.  ? ondansetron (ZOFRAN ODT) 4 MG disintegrating tablet Take 1 tablet (4 mg total) by mouth every 8 (eight) hours as needed for nausea or vomiting. (Patient not taking: Reported on 11/21/2021)  ? potassium chloride SA (KLOR-CON) 20 MEQ tablet Take 1 tablet (20 mEq total) by mouth daily. (Patient not taking: Reported on 03/17/2021)  ? promethazine-dextromethorphan (PROMETHAZINE-DM) 6.25-15 MG/5ML syrup Take 5 mLs by mouth 4 (four) times daily as needed for cough. (Patient not taking: Reported on 03/17/2021)  ? [DISCONTINUED] diphenhydrAMINE (BENADRYL) 25 MG tablet Take 1 tablet (25 mg total) by mouth every 6 (six) hours as needed for up to 3 days.  ? [DISCONTINUED]  famotidine (PEPCID) 20 MG tablet Take 1 tablet (20 mg total) by mouth 2 (two) times daily for 5 days.  ? ?No facility-administered encounter medications on file as of 01/10/2022.  ?  ? ?Patient Active Problem List  ? Diagnosis Date Noted  ? Fibroids 07/12/2021  ? Need for prophylactic vaccination and inoculation against influenza 07/12/2021  ? Viral illness 01/08/2019  ? HIV disease ([place]) 03/05/2017  ? HTN (hypertension) 03/05/2017  ? Anemia due to chronic blood loss 03/13/2016  ? Acquired immunodeficiency syndrome ([place]) 12/07/2015  ? Acne vulgaris 12/07/2015  ? Episodic mood disorder ([place]) 12/07/2015  ? ? ? ?Health Maintenance Due  ?Topic Date Due  ? COLONOSCOPY (Pts 45-8yr Insurance coverage will need to be confirmed)  Never done  ? COVID-19 Vaccine (2 - Pfizer risk series) 08/16/2020  ?  ? ?Review of Systems ?Review of Systems  ?Constitutional: Negative for fever, chills, diaphoresis, activity change, appetite change, fatigue and unexpected weight change.  ?HENT: Negative for congestion, sore throat, rhinorrhea, sneezing, trouble swallowing and sinus pressure.  ?Eyes: Negative for photophobia and visual disturbance.  ?Respiratory: Negative for cough, chest tightness, shortness of breath, wheezing and stridor.  ?Cardiovascular: Negative for chest pain, palpitations and leg swelling.  ?Gastrointestinal: Negative for nausea, vomiting, abdominal pain, diarrhea, constipation, blood in stool, abdominal distention and anal bleeding.  ?Genitourinary: Negative for dysuria, hematuria, flank pain and difficulty urinating.  ?Musculoskeletal: Negative for myalgias, back pain, joint swelling, arthralgias and gait problem.  ?Skin: Negative  for color change, pallor, rash and wound.  ?Neurological: Negative for dizziness, tremors, weakness and light-headedness.  ?Hematological: Negative for adenopathy. Does not bruise/bleed easily.  ?Psychiatric/Behavioral: + had episode of depression but now improved ? ?Physical Exam  ? ?BP (!)  151/84   Pulse 78   Temp 98.3 ?F (36.8 ?C) (Oral)   Wt 178 lb (80.7 kg)   SpO2 100%   BMI 30.55 kg/m?   ?Physical Exam  ?Constitutional:  oriented to person, place, and time. appears well-developed and well-nourished. No distress.  ?HENT: [place]/AT, PERRLA, no scleral icterus ?Mouth/Throat: Oropharynx is clear and moist. No oropharyngeal exudate.  ?Cardiovascular: Normal rate, regular rhythm and normal heart sounds. Exam reveals no gallop and no friction rub.  ?No murmur heard.  ?Pulmonary/Chest: Effort normal and breath sounds normal. No respiratory distress.  has no wheezes.  ?Neck = supple, no nuchal rigidity ?Lymphadenopathy: no cervical adenopathy. No axillary adenopathy ?Neurological: alert and oriented to person, place, and time.  ?Skin: Skin is warm and dry. No rash noted. No erythema.  ?Psychiatric: a normal mood and affect.  behavior is normal.  ? ?Lab Results  ?Component Value Date  ? CD4TCELL SEE SEPARATE REPORT 12/26/2021  ? ?Lab Results  ?Component Value Date  ? CD4TABS 746 07/12/2021  ? CD4TABS 537 02/02/2021  ? CD4TABS 396 (L) 07/26/2020  ? ?Lab Results  ?Component Value Date  ? HIV1RNAQUANT Not Detected 07/12/2021  ? ?Lab Results  ?Component Value Date  ? HEPBSAB POS (A) 12/09/2015  ? ?Lab Results  ?Component Value Date  ? LABRPR NON-REACTIVE 02/02/2021  ? ? ?CBC ?Lab Results  ?Component Value Date  ? WBC 4.1 12/26/2021  ? RBC 4.56 12/26/2021  ? HGB 13.3 12/26/2021  ? HCT 41.8 12/26/2021  ? PLT 293 12/26/2021  ? MCV 92 12/26/2021  ? MCH 29.2 12/26/2021  ? MCHC 31.8 12/26/2021  ? RDW 13.6 12/26/2021  ? LYMPHSABS 1.9 12/26/2021  ? MONOABS 0.4 07/06/2021  ? EOSABS 0.0 12/26/2021  ? ? ?BMET ?Lab Results  ?Component Value Date  ? NA 140 12/26/2021  ? K 3.5 12/26/2021  ? CL 107 12/26/2021  ? CO2 27 12/26/2021  ? GLUCOSE 91 12/26/2021  ? BUN 12 12/26/2021  ? CREATININE 1.23 (H) 12/26/2021  ? CALCIUM 9.7 12/26/2021  ? GFRNONAA 54 (L) 12/26/2021  ? GFRAA 87 02/02/2021  ? ? ? ? ?Assessment and Plan ? ?HIV  disease =  will continue on descovy and prezcobix. Will do VL to see if could be candidate for injectable ? ?Long term medication management = will check cr to see if any changes. Currently ckd 2 ? ?Health maintenance = will do sti testing ? ?Pre-hypertension = will see at next visit if BP still elevated to start medications. She was agitated during this appt which could account for abn reading ? ?Women's health = has appt with dr Ilda Basset on 01/30/22 for evaluation; declined repeat mammo but wondering if breast MRI -she would be amenable to doing. 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  ? ?

## 2022-01-11 LAB — URINE CYTOLOGY ANCILLARY ONLY
Chlamydia: NEGATIVE
Comment: NEGATIVE
Comment: NORMAL
Neisseria Gonorrhea: NEGATIVE

## 2022-01-14 LAB — HIV-1 RNA QUANT-NO REFLEX-BLD
HIV 1 RNA Quant: NOT DETECTED copies/mL
HIV-1 RNA Quant, Log: NOT DETECTED Log copies/mL

## 2022-01-14 LAB — RPR: RPR Ser Ql: NONREACTIVE

## 2022-01-24 ENCOUNTER — Telehealth: Payer: Self-pay

## 2022-01-24 ENCOUNTER — Ambulatory Visit (INDEPENDENT_AMBULATORY_CARE_PROVIDER_SITE_OTHER): Payer: Commercial Managed Care - HMO | Admitting: Internal Medicine

## 2022-01-24 ENCOUNTER — Other Ambulatory Visit: Payer: Self-pay

## 2022-01-24 DIAGNOSIS — B20 Human immunodeficiency virus [HIV] disease: Secondary | ICD-10-CM | POA: Diagnosis not present

## 2022-01-24 DIAGNOSIS — L0292 Furuncle, unspecified: Secondary | ICD-10-CM

## 2022-01-24 DIAGNOSIS — Z1239 Encounter for other screening for malignant neoplasm of breast: Secondary | ICD-10-CM

## 2022-01-24 MED ORDER — SULFAMETHOXAZOLE-TRIMETHOPRIM 800-160 MG PO TABS: 1.0000 | ORAL_TABLET | Freq: Two times a day (BID) | ORAL | 0 refills | Status: DC

## 2022-01-24 NOTE — Telephone Encounter (Signed)
Patient states she has a "boil" to her left buttock. States area is warm to touch, not draining. Patient states she prefers not to use any warm compress or have to area to open up due to higher risk of infection to area. Offered patient Mychart visit, patient declined. Patient prefers telephone visit to discuss with Dr. Birch Flattery and agreed to appointment time for today.  Dawn [name]

## 2022-01-24 NOTE — Progress Notes (Signed)
Virtual Visit via Telephone Note  I connected with [employer] on 01/24/22 at  3:00 PM EDT by telephone and verified that I am speaking with the correct person using two identifiers.  Location: Patient: at home Provider: at clinic   I discussed the limitations, risks, security and privacy concerns of performing an evaluation and management service by telephone and the availability of in person appointments. I also discussed with the patient that there may be a patient responsible charge related to this service. The patient expressed understanding and agreed to proceed.   History of Present Illness: Has appt with PCP in 1 week. She has had new boil - intermittent on left buttock She is not trying to be difficult about getting mammo. She just doesn't want to have to have breast biopsy. Also had unpleasant experience with mammogram.   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 Follow up with primary doctor    I discussed the assessment and treatment plan with the patient. The patient was provided an opportunity to ask questions and all were answered. The patient agreed with the plan and demonstrated an understanding of the instructions.   The patient was advised to call back or seek an in-person evaluation if the symptoms worsen or if the condition fails to improve as anticipated.  I provided 15 minutes of non-face-to-face time during this encounter.   Carlyle Basques, MD

## 2022-01-30 ENCOUNTER — Ambulatory Visit (INDEPENDENT_AMBULATORY_CARE_PROVIDER_SITE_OTHER): Payer: Commercial Managed Care - HMO | Admitting: Obstetrics and Gynecology

## 2022-01-30 ENCOUNTER — Encounter: Payer: Self-pay | Admitting: Obstetrics and Gynecology

## 2022-01-30 VITALS — BP 158/95 | HR 83 | Wt 176.6 lb

## 2022-01-30 DIAGNOSIS — F419 Anxiety disorder, unspecified: Secondary | ICD-10-CM | POA: Diagnosis not present

## 2022-01-30 DIAGNOSIS — F32A Depression, unspecified: Secondary | ICD-10-CM | POA: Diagnosis not present

## 2022-01-30 DIAGNOSIS — E049 Nontoxic goiter, unspecified: Secondary | ICD-10-CM | POA: Diagnosis not present

## 2022-01-30 DIAGNOSIS — N951 Menopausal and female climacteric states: Secondary | ICD-10-CM | POA: Diagnosis not present

## 2022-01-30 NOTE — Progress Notes (Unsigned)
Referral placed for IBH, GAD7 and PHQ-9 scores negative but patient with environmental stressors, concerns. Would like to be established with [facility] care.  Altha Harm, CMA

## 2022-01-30 NOTE — Progress Notes (Unsigned)
Obstetrics and Gynecology Visit Return Patient Evaluation  Appointment Date: 01/30/2022  Primary Care Provider: Pcp, No  OBGYN Clinic: [facility]  Chief Complaint: hot flashes. Enlarged thyroid  History of Present Illness:  Dawn [name] is a 51 y.o. with above CC. LMP September 2022 and has had hot flashes and night sweats for the past few months; she is not sexually active.   Pt states her throat feels "puffy" and she can hve issues with singing for long periods of time.   Review of Systems: as noted in the History of Present Illness.  Patient Active Problem List   Diagnosis Date Noted   Enlarged thyroid gland 01/30/2022   Fibroids 07/12/2021   Need for prophylactic vaccination and inoculation against influenza 07/12/2021   Viral illness 01/08/2019   HIV disease ([place]) 03/05/2017   HTN (hypertension) 03/05/2017   Anemia due to chronic blood loss 03/13/2016   Acquired immunodeficiency syndrome ([place]) 12/07/2015   Acne vulgaris 12/07/2015   Episodic mood disorder ([place]) 12/07/2015   Medications:  Annetta Maw had no medications administered during this visit. Current Outpatient Medications  Medication Sig Dispense Refill   albuterol (VENTOLIN HFA) 108 (90 Base) MCG/ACT inhaler Inhale 1 puff into the lungs every 6 (six) hours as needed for wheezing or shortness of breath. 18 g 0   DESCOVY 200-25 MG tablet TAKE ONE TABLET BY MOUTH DAILY 30 tablet 3   ferrous sulfate 325 (65 FE) MG tablet Take 1 tablet (325 mg total) by mouth daily. 30 tablet 0   megestrol (MEGACE) 40 MG tablet Take 1 tablet (40 mg total) by mouth 2 (two) times daily. Can increase to two tablets twice a day in the event of heavy bleeding 60 tablet 5   PREZCOBIX 800-150 MG tablet TAKE ONE TABLET BY MOUTH DAILY WITH BREAKFAST *SWALLOW WHOLE. DO NOT CRUSH, BREAK OR CHEW TABLETS* 30 tablet 3   sulfamethoxazole-trimethoprim (BACTRIM DS) 800-160 MG tablet Take 1 tablet by mouth 2  (two) times daily. 14 tablet 0   valACYclovir (VALTREX) 1000 MG tablet TAKE ONE TABLET BY MOUTH DAILY 30 tablet 5   dicyclomine (BENTYL) 20 MG tablet Take 1 tablet (20 mg total) by mouth 2 (two) times daily for 3 days. 6 tablet 0   ondansetron (ZOFRAN ODT) 4 MG disintegrating tablet Take 1 tablet (4 mg total) by mouth every 8 (eight) hours as needed for nausea or vomiting. (Patient not taking: Reported on 11/21/2021) 20 tablet 0   potassium chloride SA (KLOR-CON) 20 MEQ tablet Take 1 tablet (20 mEq total) by mouth daily. (Patient not taking: Reported on 03/17/2021) 5 tablet 0   promethazine-dextromethorphan (PROMETHAZINE-DM) 6.25-15 MG/5ML syrup Take 5 mLs by mouth 4 (four) times daily as needed for cough. (Patient not taking: Reported on 03/17/2021) 118 mL 0   No current facility-administered medications for this visit.    Allergies: is allergic to chocolate and tomato.  Physical Exam:  BP (!) 158/95   Pulse 83   Wt 176 lb 9.6 oz (80.1 kg)   LMP 11/10/2021 (Approximate)   BMI 30.31 kg/m  Body mass index is 30.31 kg/m. General appearance: Well nourished, well developed female in no acute distress.  HEENT: enlarged thyroid, nttp Neuro/Psych:  Normal mood and affect.     Assessment: pt stable  Plan:  1. Anxiety and depression Pt desires referral to be set up with someone. Phq9 score zero  - Ambulatory referral to [facility]  2. Enlarged thyroid gland It looks  like she had an u/s and then an FNA with IR in 11/2019 with the ordering doctor being gen surg at [place]. I asked her about this and she wasn't sure about this or subsequent follow up - Thyroid Panel With TSH - US THYROID; Future - Ambulatory referral to General Surgery  3. Hot flashes due to menopause I told her that this is normal and when she gets to September she will technically be in menopause and to let us know if she ever has any bleeding or spotting. Pt okay with behavioral interventions for  the s/s given I told her she wouldn't be a candidate for any HRT given the HTN and being on HAART therapy   RTC: PRN  Durene Romans MD Attending [facility] for [facility])

## 2022-01-31 LAB — THYROID PANEL WITH TSH
Free Thyroxine Index: 1.4 (ref 1.2–4.9)
T3 Uptake Ratio: 25 % (ref 24–39)
T4, Total: 5.6 ug/dL (ref 4.5–12.0)
TSH: 1.25 u[IU]/mL (ref 0.450–4.500)

## 2022-01-31 NOTE — BH Specialist Note (Deleted)
Integrated Behavioral Health via Telemedicine Visit  01/31/2022 Dawn [name] [id number]  Number of [place] Clinician visits: No data recorded Session Start time: No data recorded  Session End time: No data recorded Total time in minutes: No data recorded  Referring Provider: *** Patient/Family location: *** [facility] Provider location: *** All persons participating in visit: *** Types of Service: {CHL AMB TYPE OF SERVICE:[phone]}  I connected with Dawn [name] and/or Dawn [name] {family members:20773} via  Telephone or Geologist, engineering  (Video is Tree surgeon) and verified that I am speaking with the correct person using two identifiers. Discussed confidentiality: {YES/NO:21197}  I discussed the limitations of telemedicine and the availability of in person appointments.  Discussed there is a possibility of technology failure and discussed alternative modes of communication if that failure occurs.  I discussed that engaging in this telemedicine visit, they consent to the provision of behavioral healthcare and the services will be billed under their insurance.  Patient and/or legal guardian expressed understanding and consented to Telemedicine visit: {YES/NO:21197}  Presenting Concerns: Patient and/or family reports the following symptoms/concerns: *** Duration of problem: ***; Severity of problem: {Mild/Moderate/Severe:20260}  Patient and/or Family's Strengths/Protective Factors: {CHL AMB BH PROTECTIVE FACTORS:[phone]}  Goals Addressed: Patient will:  Reduce symptoms of: {IBH Symptoms:21014056}   Increase knowledge and/or ability of: {IBH Patient Tools:21014057}   Demonstrate ability to: {IBH Goals:21014053}  Progress towards Goals: {CHL AMB BH PROGRESS TOWARDS GOALS:[phone]}  Interventions: Interventions utilized:  {IBH Interventions:21014054} Standardized Assessments completed: {IBH Screening  Tools:21014051}  Patient and/or Family Response: ***  Assessment: Patient currently experiencing ***.   Patient may benefit from ***.  Plan: Follow up with behavioral health clinician on : *** Behavioral recommendations: *** Referral(s): {IBH Referrals:21014055}  I discussed the assessment and treatment plan with the patient and/or parent/guardian. They were provided an opportunity to ask questions and all were answered. They agreed with the plan and demonstrated an understanding of the instructions.   They were advised to call back or seek an in-person evaluation if the symptoms worsen or if the condition fails to improve as anticipated.  [name], LCSW     01/30/2022   11:47 AM 11/21/2021   11:24 AM 07/12/2021    3:17 PM 03/17/2021    4:22 PM 02/02/2021    8:50 AM  Depression screen PHQ 2/9  Decreased Interest 0 3 0 2 0  Down, Depressed, Hopeless 0 0 0 2 0  PHQ - 2 Score 0 3 0 4 0  Altered sleeping 0 0  3   Tired, decreased energy 0 1  3   Change in appetite 0 0  0   Feeling bad or failure about yourself  0 0  3   Trouble concentrating 0 0  2   Moving slowly or fidgety/restless 0 0  2   Suicidal thoughts 0 0  0   PHQ-9 Score 0 4  17       01/30/2022   11:47 AM 11/21/2021   11:25 AM 03/17/2021    4:22 PM 05/14/2019    4:10 PM  GAD 7 : Generalized Anxiety Score  Nervous, Anxious, on Edge 0 0 2 2  Control/stop worrying 0 0 2 2  Worry too much - different things 0 0 2 2  Trouble relaxing 0 0 2 1  Restless 0 0 2 2  Easily annoyed or irritable 0 0 0 0  Afraid - awful might happen 0 0 0 2  Total GAD 7 Score  0 0 10 11     

## 2022-02-02 ENCOUNTER — Ambulatory Visit (HOSPITAL_COMMUNITY): Admission: RE | Admit: 2022-02-02 | Payer: Commercial Managed Care - HMO | Source: Ambulatory Visit

## 2022-02-05 ENCOUNTER — Ambulatory Visit: Payer: Medicaid Other

## 2022-02-08 ENCOUNTER — Other Ambulatory Visit: Payer: Self-pay | Admitting: Internal Medicine

## 2022-02-08 DIAGNOSIS — B009 Herpesviral infection, unspecified: Secondary | ICD-10-CM

## 2022-03-02 ENCOUNTER — Other Ambulatory Visit (HOSPITAL_COMMUNITY): Payer: Medicaid Other

## 2022-03-06 ENCOUNTER — Other Ambulatory Visit: Payer: Self-pay

## 2022-03-06 ENCOUNTER — Telehealth: Payer: Self-pay

## 2022-03-06 DIAGNOSIS — L0292 Furuncle, unspecified: Secondary | ICD-10-CM

## 2022-03-06 MED ORDER — SULFAMETHOXAZOLE-TRIMETHOPRIM 800-160 MG PO TABS: 1.0000 | ORAL_TABLET | Freq: Two times a day (BID) | ORAL | 0 refills | Status: DC

## 2022-03-06 NOTE — Telephone Encounter (Signed)
Bactrim prescription refilled per Dr. Babiarz Flattery. Sent to [facility]. Called patient and made her aware prescription was available. Pt verbalized understanding; no additional questions.  Binnie Kand, RN

## 2022-03-06 NOTE — Telephone Encounter (Signed)
Patient called and stated she has a new boil on her bottom ("right side cheek on butt") that she believes started last week. Denies any other symptoms. Requests prescription; states the antibiotic she was prescribed previously for this issue was effective.  Routed to provider; please advise if appointment needed.  Confirmed patient uses [facility].  Binnie Kand, RN

## 2022-03-26 ENCOUNTER — Other Ambulatory Visit: Payer: Self-pay

## 2022-03-26 ENCOUNTER — Emergency Department (HOSPITAL_COMMUNITY)
Admission: EM | Admit: 2022-03-26 | Discharge: 2022-03-26 | Disposition: A | Discharge status: Home / Self Care | Payer: No Typology Code available for payment source | Attending: Emergency Medicine | Admitting: Emergency Medicine

## 2022-03-26 ENCOUNTER — Encounter (HOSPITAL_COMMUNITY): Payer: Self-pay

## 2022-03-26 ENCOUNTER — Emergency Department (HOSPITAL_COMMUNITY): Payer: No Typology Code available for payment source

## 2022-03-26 DIAGNOSIS — E860 Dehydration: Secondary | ICD-10-CM | POA: Insufficient documentation

## 2022-03-26 DIAGNOSIS — Z21 Asymptomatic human immunodeficiency virus [HIV] infection status: Secondary | ICD-10-CM | POA: Diagnosis not present

## 2022-03-26 DIAGNOSIS — E049 Nontoxic goiter, unspecified: Secondary | ICD-10-CM

## 2022-03-26 DIAGNOSIS — R5383 Other fatigue: Secondary | ICD-10-CM | POA: Diagnosis present

## 2022-03-26 DIAGNOSIS — Z20822 Contact with and (suspected) exposure to covid-19: Secondary | ICD-10-CM | POA: Insufficient documentation

## 2022-03-26 LAB — URINALYSIS, ROUTINE W REFLEX MICROSCOPIC
Bacteria, UA: NONE SEEN
Bilirubin Urine: NEGATIVE
Glucose, UA: NEGATIVE mg/dL
Hgb urine dipstick: NEGATIVE
Ketones, ur: NEGATIVE mg/dL
Leukocytes,Ua: NEGATIVE
Nitrite: NEGATIVE
Protein, ur: 30 mg/dL — AB
Specific Gravity, Urine: 1.026 (ref 1.005–1.030)
pH: 6 (ref 5.0–8.0)

## 2022-03-26 LAB — CBC WITH DIFFERENTIAL/PLATELET
Abs Immature Granulocytes: 0.01 10*3/uL (ref 0.00–0.07)
Basophils Absolute: 0 10*3/uL (ref 0.0–0.1)
Basophils Relative: 0 %
Eosinophils Absolute: 0 10*3/uL (ref 0.0–0.5)
Eosinophils Relative: 1 %
HCT: 37.4 % (ref 36.0–46.0)
Hemoglobin: 12.1 g/dL (ref 12.0–15.0)
Immature Granulocytes: 0 %
Lymphocytes Relative: 35 %
Lymphs Abs: 1.2 10*3/uL (ref 0.7–4.0)
MCH: 29.1 pg (ref 26.0–34.0)
MCHC: 32.4 g/dL (ref 30.0–36.0)
MCV: 89.9 fL (ref 80.0–100.0)
Monocytes Absolute: 0.3 10*3/uL (ref 0.1–1.0)
Monocytes Relative: 10 %
Neutro Abs: 1.8 10*3/uL (ref 1.7–7.7)
Neutrophils Relative %: 54 %
Platelets: 284 10*3/uL (ref 150–400)
RBC: 4.16 MIL/uL (ref 3.87–5.11)
RDW: 14 % (ref 11.5–15.5)
WBC: 3.3 10*3/uL — ABNORMAL LOW (ref 4.0–10.5)
nRBC: 0 % (ref 0.0–0.2)

## 2022-03-26 LAB — COMPREHENSIVE METABOLIC PANEL
ALT: 14 U/L (ref 0–44)
AST: 18 U/L (ref 15–41)
Albumin: 3.8 g/dL (ref 3.5–5.0)
Alkaline Phosphatase: 55 U/L (ref 38–126)
Anion gap: 6 (ref 5–15)
BUN: 11 mg/dL (ref 6–20)
CO2: 24 mmol/L (ref 22–32)
Calcium: 9.2 mg/dL (ref 8.9–10.3)
Chloride: 107 mmol/L (ref 98–111)
Creatinine, Ser: 1.03 mg/dL — ABNORMAL HIGH (ref 0.44–1.00)
GFR, Estimated: >60 mL/min (ref >60)
Glucose, Bld: 89 mg/dL (ref 70–99)
Potassium: 3.4 mmol/L — ABNORMAL LOW (ref 3.5–5.1)
Sodium: 137 mmol/L (ref 135–145)
Total Bilirubin: 0.8 mg/dL (ref 0.3–1.2)
Total Protein: 7.4 g/dL (ref 6.5–8.1)

## 2022-03-26 LAB — TSH: TSH: 1.331 u[IU]/mL (ref 0.350–4.500)

## 2022-03-26 LAB — MAGNESIUM: Magnesium: 2 mg/dL (ref 1.7–2.4)

## 2022-03-26 LAB — I-STAT BETA HCG BLOOD, ED (MC, WL, AP ONLY): I-stat hCG, quantitative: <5 m[IU]/mL (ref <5)

## 2022-03-26 LAB — SARS CORONAVIRUS 2 BY RT PCR: SARS Coronavirus 2 by RT PCR: NEGATIVE

## 2022-03-26 MED ORDER — SODIUM CHLORIDE 0.9 % IV BOLUS
1000.0000 mL | Freq: Once | INTRAVENOUS | Status: AC
Start: 2022-03-26 — End: 2022-03-26
  Administered 2022-03-26: 1000 mL via INTRAVENOUS

## 2022-03-26 MED ORDER — IOHEXOL 300 MG/ML  SOLN
75.0000 mL | Freq: Once | INTRAMUSCULAR | Status: AC | PRN
  Administered 2022-03-26: 75 mL via INTRAVENOUS

## 2022-03-26 NOTE — ED Triage Notes (Signed)
Pt BIB [facility] for being tired x2 days. Pt reports sleeping "a lot." Pt does states she has a sore throat and decreased appetite and nausea. Pt had oral sex earlier in the week. Pt stats she has been drinking more coffee lately.   EMS V/S:  176/76 [id number]

## 2022-03-26 NOTE — ED Notes (Signed)
Pt provided discharge instructions and prescription information. Pt was given the opportunity to ask questions and questions were answered.   

## 2022-03-26 NOTE — Discharge Instructions (Addendum)
Dr. Ilda Basset put in a referral for you to see the surgeon about your thyroid in June.  I have put their number on your discharge paper work.

## 2022-03-26 NOTE — ED Provider Notes (Signed)
[facility] EMERGENCY DEPARTMENT Provider Note   CSN: [id number] Arrival date & time: 03/26/22  1506     History  Chief Complaint  Patient presents with   Fatigue    Dawn [name] is a 51 y.o. female.  Pt is a 51 yo female with a pmhx significant for anemia due to fibroids, HIV (viral load undetectable) and anxiety.  Pt said she has been feeling tired for over a month.  She is worried something is wrong.  She denies any pain.  She said her LMP was last month.  She denies any f/c.  She reports compliance with meds.       Home Medications Prior to Admission medications   Medication Sig Start Date End Date Taking? Authorizing Provider  albuterol (VENTOLIN HFA) 108 (90 Base) MCG/ACT inhaler Inhale 1 puff into the lungs every 6 (six) hours as needed for wheezing or shortness of breath. 06/14/21   Chase Picket, MD  DESCOVY 200-25 MG tablet TAKE ONE TABLET BY MOUTH DAILY 01/11/22   Carlyle Basques, MD  dicyclomine (BENTYL) 20 MG tablet Take 1 tablet (20 mg total) by mouth 2 (two) times daily for 3 days. 12/26/21 12/29/21  Garald Balding, PA-C  ferrous sulfate 325 (65 FE) MG tablet Take 1 tablet (325 mg total) by mouth daily. 09/04/20   Petrucelli, Glynda Jaeger, PA-C  megestrol (MEGACE) 40 MG tablet Take 1 tablet (40 mg total) by mouth 2 (two) times daily. Can increase to two tablets twice a day in the event of heavy bleeding 03/17/21   Constant, Peggy, MD  ondansetron (ZOFRAN ODT) 4 MG disintegrating tablet Take 1 tablet (4 mg total) by mouth every 8 (eight) hours as needed for nausea or vomiting. Patient not taking: Reported on 11/21/2021 07/06/21   Raspet, Junie Panning K, PA-C  potassium chloride SA (KLOR-CON) 20 MEQ tablet Take 1 tablet (20 mEq total) by mouth daily. Patient not taking: Reported on 03/17/2021 09/04/20   Petrucelli, Samantha R, PA-C  PREZCOBIX 800-150 MG tablet TAKE ONE TABLET BY MOUTH DAILY WITH BREAKFAST *SWALLOW WHOLE. DO NOT CRUSH, BREAK OR CHEW TABLETS* 01/11/22    Carlyle Basques, MD  promethazine-dextromethorphan (PROMETHAZINE-DM) 6.25-15 MG/5ML syrup Take 5 mLs by mouth 4 (four) times daily as needed for cough. Patient not taking: Reported on 03/17/2021 08/29/20   Caccavale, Sophia, PA-C  sulfamethoxazole-trimethoprim (BACTRIM DS) 800-160 MG tablet Take 1 tablet by mouth 2 (two) times daily. 03/06/22   Carlyle Basques, MD  valACYclovir (VALTREX) 1000 MG tablet TAKE ONE TABLET BY MOUTH DAILY 02/08/22   Carlyle Basques, MD  diphenhydrAMINE (BENADRYL) 25 MG tablet Take 1 tablet (25 mg total) by mouth every 6 (six) hours as needed for up to 3 days. 06/17/20 09/04/20  Lannie Fields, PA-C  famotidine (PEPCID) 20 MG tablet Take 1 tablet (20 mg total) by mouth 2 (two) times daily for 5 days. 06/17/20 09/04/20  Lannie Fields, PA-C      Allergies    Chocolate and Tomato    Review of Systems   Review of Systems  Neurological:  Positive for weakness.  All other systems reviewed and are negative.   Physical Exam Updated Vital Signs BP (!) 157/89   Pulse 78   Temp 99 F (37.2 C)   Resp 16   Ht '5\' 4"'$  (1.626 m)   Wt 80.7 kg   LMP 11/10/2021 (Approximate)   SpO2 98%   BMI 30.55 kg/m  Physical Exam Vitals and nursing note reviewed.  Constitutional:  Appearance: Normal appearance.  HENT:     Head: Normocephalic and atraumatic.     Right Ear: External ear normal.     Left Ear: External ear normal.     Nose: Nose normal.     Mouth/Throat:     Mouth: Mucous membranes are moist.     Pharynx: Oropharynx is clear.  Eyes:     Extraocular Movements: Extraocular movements intact.     Conjunctiva/sclera: Conjunctivae normal.     Pupils: Pupils are equal, round, and reactive to light.  Cardiovascular:     Rate and Rhythm: Normal rate and regular rhythm.     Pulses: Normal pulses.     Heart sounds: Normal heart sounds.  Pulmonary:     Effort: Pulmonary effort is normal.     Breath sounds: Normal breath sounds.  Abdominal:     General: Abdomen is flat.  Bowel sounds are normal.     Palpations: Abdomen is soft.  Musculoskeletal:        General: Normal range of motion.     Cervical back: Normal range of motion and neck supple.  Skin:    General: Skin is warm.     Capillary Refill: Capillary refill takes less than 2 seconds.  Neurological:     General: No focal deficit present.     Mental Status: She is alert and oriented to person, place, and time.  Psychiatric:        Mood and Affect: Mood normal.        Behavior: Behavior normal.     ED Results / Procedures / Treatments   Labs (all labs ordered are listed, but only abnormal results are displayed) Labs Reviewed  COMPREHENSIVE METABOLIC PANEL - Abnormal; Notable for the following components:      Result Value   Potassium 3.4 (*)    Creatinine, Ser 1.03 (*)    All other components within normal limits  CBC WITH DIFFERENTIAL/PLATELET - Abnormal; Notable for the following components:   WBC 3.3 (*)    All other components within normal limits  URINALYSIS, ROUTINE W REFLEX MICROSCOPIC - Abnormal; Notable for the following components:   APPearance HAZY (*)    Protein, ur 30 (*)    All other components within normal limits  SARS CORONAVIRUS 2 BY RT PCR  TSH  MAGNESIUM  I-STAT BETA HCG BLOOD, ED ([facility], [facility], [facility] ONLY)    EKG EKG Interpretation  Date/Time:  Sunday March 26 2022 15:15:10 EDT Ventricular Rate:  90 PR Interval:  173 QRS Duration: 83 QT Interval:  355 QTC Calculation: 435 R Axis:   73 Text Interpretation: Sinus rhythm Probable anterior infarct, old No significant change since last tracing Confirmed by Isla Pence [phone]) on 03/26/2022 3:21:08 PM  Radiology CT Chest W Contrast  Result Date: 03/26/2022 CLINICAL DATA:  Abnormal x-ray with lung nodule. Sore throat decreased appetite and nausea. EXAM: CT CHEST WITH CONTRAST TECHNIQUE: Multidetector CT imaging of the chest was performed during intravenous contrast administration. RADIATION DOSE REDUCTION: This exam was  performed according to the departmental dose-optimization program which includes automated exposure control, adjustment of the mA and/or kV according to patient size and/or use of iterative reconstruction technique. CONTRAST:  93m OMNIPAQUE IOHEXOL 300 MG/ML  SOLN COMPARISON:  Chest radiograph 03/26/2022.  CT chest 07/11/2021 FINDINGS: Cardiovascular: No significant vascular findings. Normal heart size. No pericardial effusion. 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 Thyroid measures up to 4.8 x 6.7 cm. No  change since prior study. This has been evaluated on previous ultrasound from 11/06/2019 and biopsy 12/18/2019. No significant lymphadenopathy. Esophagus is decompressed. Lungs/Pleura: Lungs are clear. No pleural effusion or pneumothorax. Upper Abdomen: No acute abnormalities. Musculoskeletal: No chest wall abnormality. No acute or significant osseous findings. IMPRESSION: 1. Diffusely enlarged nodular thyroid mass accounts for radiographic abnormality. This has been evaluated on previous imaging. Prior ultrasound and biopsy. See previous reports. (Ref: J Am Coll Radiol. 2015 Feb;12(2): 143-50). 2. Lungs are clear. Electronically Signed   By: Lucienne Capers M.D.   On: 03/26/2022 19:13   DG Chest Portable 1 View  Result Date: 03/26/2022 CLINICAL DATA:  Weakness. EXAM: PORTABLE CHEST 1 VIEW COMPARISON:  Chest radiograph 12/26/2021 and earlier. FINDINGS: The heart size and mediastinal contours are within normal limits. New nodular density at the medial right apex. The lungs are otherwise clear. No pleural effusion or pneumothorax. Visualized osseous structures are unremarkable. IMPRESSION: New nodular density at the medial right apex may represent a pulmonary nodule versus superior mediastinal mass. Recommend CT chest for further evaluation. Electronically Signed   By: Ileana Roup M.D.   On: 03/26/2022 17:44     Procedures Procedures    Medications Ordered in ED Medications  sodium chloride 0.9 % bolus 1,000 mL (0 mLs Intravenous Stopped 03/26/22 1658)  iohexol (OMNIPAQUE) 300 MG/ML solution 75 mL (75 mLs Intravenous Contrast Given 03/26/22 1831)    ED Course/ Medical Decision Making/ A&P                           Medical Decision Making Amount and/or Complexity of Data Reviewed Labs: ordered. Radiology: ordered.  Risk Prescription drug management.   This patient presents to the ED for concern of weakness, this involves an extensive number of treatment options, and is a complaint that carries with it a high risk of complications and morbidity.  The differential diagnosis includes anemia, pregnancy, electrolyte abn, infection   Co morbidities that complicate the patient evaluation  anemia due to fibroids, HIV (viral load undetectable) and anxiety   Additional history obtained:  Additional history obtained from epic chart review External records from outside source obtained and reviewed including EMS report   Lab Tests:  I Ordered, and personally interpreted labs.  The pertinent results include:  cbc with wbc slightly low at 3.3; cmp nl; mg nl; preg neg; tsh nl; ua nl   Imaging Studies ordered:  I ordered imaging studies including cxr and ct chest  I independently visualized and interpreted imaging which showed  CXR: IMPRESSION:  New nodular density at the medial right apex may represent a  pulmonary nodule versus superior mediastinal mass. Recommend CT  chest for further evaluation.  CT chest: IMPRESSION:  1. Diffusely enlarged nodular thyroid mass accounts for radiographic  abnormality. This has been evaluated on previous imaging. Prior  ultrasound and biopsy. See previous reports. (Ref: J Am Coll Radiol.  2015 Feb;12(2): 143-50).  2. Lungs are clear.   I agree with the radiologist interpretation   Cardiac Monitoring:  The patient was maintained on a cardiac  monitor.  I personally viewed and interpreted the cardiac monitored which showed an underlying rhythm of: nsr   Medicines ordered and prescription drug management:  I ordered medication including IVFs  for dehydration  Reevaluation of the patient after these medicines showed that the patient improved I have reviewed the patients home medicines and have made adjustments as needed   Test Considered:  ct   Critical Interventions:  ivfs    Problem List / ED Course:  Weakness:  likely due to dehydration.  She feels much better after fluids. Enlarged thyroid.  She has seen surgery for this in the past.  She was referred again to surgery by Dr. Ilda Basset.  She is told to f/u with surgery.  TSH is nl.    Reevaluation:  After the interventions noted above, I reevaluated the patient and found that they have :improved   Social Determinants of Health:  Lives at home   Dispostion:  After consideration of the diagnostic results and the patients response to treatment, I feel that the patent would benefit from discharge with outpatient f/u.          Final Clinical Impression(s) / ED Diagnoses Final diagnoses:  Dehydration  Enlarged thyroid gland    Rx / DC Orders ED Discharge Orders     None         Isla Pence, MD 03/26/22 1924

## 2022-04-04 ENCOUNTER — Ambulatory Visit (INDEPENDENT_AMBULATORY_CARE_PROVIDER_SITE_OTHER): Payer: Commercial Managed Care - HMO | Admitting: General Practice

## 2022-04-04 ENCOUNTER — Encounter: Payer: Self-pay | Admitting: General Practice

## 2022-04-04 DIAGNOSIS — Z3202 Encounter for pregnancy test, result negative: Secondary | ICD-10-CM | POA: Diagnosis not present

## 2022-04-04 LAB — POCT PREGNANCY, URINE: Preg Test, Ur: NEGATIVE

## 2022-04-04 NOTE — Progress Notes (Signed)
Patient came by office to drop off urine sample for UPT. UPT -.  Called patient at requested number, no answer. Unable to leave message as voicemail was not set up. Will send mychart message.  Koren Bound RN BSN 04/04/22

## 2022-04-05 ENCOUNTER — Telehealth: Payer: Self-pay | Admitting: General Practice

## 2022-04-05 NOTE — Telephone Encounter (Signed)
Patient called into office requesting results. Informed patient of negative test results. Patient verbalized understanding.

## 2022-04-06 ENCOUNTER — Ambulatory Visit (HOSPITAL_COMMUNITY)
Admission: EM | Admit: 2022-04-06 | Discharge: 2022-04-06 | Disposition: A | Discharge status: Home / Self Care | Payer: Commercial Managed Care - HMO | Attending: Internal Medicine | Admitting: Internal Medicine

## 2022-04-06 ENCOUNTER — Encounter (HOSPITAL_COMMUNITY): Payer: Self-pay | Admitting: Emergency Medicine

## 2022-04-06 ENCOUNTER — Inpatient Hospital Stay (HOSPITAL_COMMUNITY)
Admission: AD | Admit: 2022-04-06 | Discharge: 2022-04-06 | Disposition: A | Discharge status: Home / Self Care | Payer: Commercial Managed Care - HMO | Attending: Obstetrics & Gynecology | Admitting: Obstetrics & Gynecology

## 2022-04-06 DIAGNOSIS — L0231 Cutaneous abscess of buttock: Secondary | ICD-10-CM

## 2022-04-06 DIAGNOSIS — Z3202 Encounter for pregnancy test, result negative: Secondary | ICD-10-CM | POA: Diagnosis not present

## 2022-04-06 DIAGNOSIS — L0232 Furuncle of buttock: Secondary | ICD-10-CM

## 2022-04-06 LAB — POCT PREGNANCY, URINE: Preg Test, Ur: NEGATIVE

## 2022-04-06 MED ORDER — AMOXICILLIN-POT CLAVULANATE 875-125 MG PO TABS: 1.0000 | ORAL_TABLET | Freq: Two times a day (BID) | ORAL | 0 refills | Status: DC

## 2022-04-06 MED ORDER — LIDOCAINE-EPINEPHRINE 1 %-1:100000 IJ SOLN
INTRAMUSCULAR | Status: AC
  Filled 2022-04-06: qty 1

## 2022-04-06 NOTE — MAU Note (Addendum)
.  Dawn [name] is a 51 y.o. here in MAU reporting: has a boil on her left buttock. She states it started about x3 days ago. Denies VB or abnormal discharge. States she had a +HPT on 03/28/22. LMP: 03/09/22  Pain score: 5 Vitals:   04/06/22 1651  BP: (!) 142/86  Pulse: (!) 111  Resp: 14  Temp: 98.2 F (36.8 C)  SpO2: 100%      Lab orders placed from triage:  UPT

## 2022-04-06 NOTE — Discharge Instructions (Addendum)
Please continue sitz bath's daily Take antibiotics as prescribed Take Tylenol as needed for pain and/or fever If you have worsening swelling, worsening pain, drainage or redness please return to urgent care to be reevaluated.

## 2022-04-06 NOTE — MAU Provider Note (Signed)
Event Date/Time   First Provider Initiated Contact with Patient 04/06/22 1703      S Ms. Donata Reddick is a 51 y.o. [id number] patient who presents to MAU today with complaint of a boil on her buttocks. She had a negative UPT in MAU, yesterday in clinic and a negative HCG on 7/30.   O BP (!) 142/86 (BP Location: Right Arm)   Pulse (!) 111   Temp 98.2 F (36.8 C)   Resp 14   LMP 11/10/2021 (Approximate)   SpO2 100%  Physical Exam Vitals and nursing note reviewed.  Constitutional:      General: She is not in acute distress.    Appearance: She is well-developed.  HENT:     Head: Normocephalic.  Eyes:     Pupils: Pupils are equal, round, and reactive to light.  Cardiovascular:     Rate and Rhythm: Normal rate and regular rhythm.     Heart sounds: Normal heart sounds.  Pulmonary:     Effort: Pulmonary effort is normal. No respiratory distress.     Breath sounds: Normal breath sounds.  Abdominal:     General: Bowel sounds are normal. There is no distension.     Palpations: Abdomen is soft.     Tenderness: There is no abdominal tenderness.  Skin:    General: Skin is warm and dry.  Neurological:     Mental Status: She is alert and oriented to person, place, and time.  Psychiatric:        Mood and Affect: Mood normal.        Behavior: Behavior normal.        Thought Content: Thought content normal.        Judgment: Judgment normal.     A Medical screening exam complete 1. Negative pregnancy test   2. Boil of buttock      P Discharge from MAU in stable condition Patient given the option of transfer to [facility] for further evaluation or seek care in outpatient facility of choice  List of options for follow-up given  Warning signs for worsening condition that would warrant emergency follow-up discussed Patient may return to MAU as needed   Wende Mott, [place] 04/06/2022 5:03 PM

## 2022-04-06 NOTE — ED Triage Notes (Signed)
Pt reports a boil on the right side of the buttocks. States it has been present for 3 days. Denies any drainage.

## 2022-04-08 ENCOUNTER — Encounter (HOSPITAL_COMMUNITY): Payer: Self-pay | Admitting: Emergency Medicine

## 2022-04-08 ENCOUNTER — Emergency Department (HOSPITAL_COMMUNITY)
Admission: EM | Admit: 2022-04-08 | Discharge: 2022-04-09 | Disposition: A | Discharge status: Home / Self Care | Payer: Commercial Managed Care - HMO | Attending: Emergency Medicine | Admitting: Emergency Medicine

## 2022-04-08 ENCOUNTER — Other Ambulatory Visit: Payer: Self-pay

## 2022-04-08 DIAGNOSIS — F419 Anxiety disorder, unspecified: Secondary | ICD-10-CM | POA: Diagnosis not present

## 2022-04-08 DIAGNOSIS — R112 Nausea with vomiting, unspecified: Secondary | ICD-10-CM | POA: Insufficient documentation

## 2022-04-08 DIAGNOSIS — R059 Cough, unspecified: Secondary | ICD-10-CM | POA: Diagnosis not present

## 2022-04-08 DIAGNOSIS — R5383 Other fatigue: Secondary | ICD-10-CM | POA: Diagnosis not present

## 2022-04-08 DIAGNOSIS — Z21 Asymptomatic human immunodeficiency virus [HIV] infection status: Secondary | ICD-10-CM | POA: Diagnosis not present

## 2022-04-08 DIAGNOSIS — Z20822 Contact with and (suspected) exposure to covid-19: Secondary | ICD-10-CM | POA: Diagnosis not present

## 2022-04-08 DIAGNOSIS — R109 Unspecified abdominal pain: Secondary | ICD-10-CM | POA: Diagnosis not present

## 2022-04-08 DIAGNOSIS — F418 Other specified anxiety disorders: Secondary | ICD-10-CM

## 2022-04-08 DIAGNOSIS — R197 Diarrhea, unspecified: Secondary | ICD-10-CM | POA: Diagnosis present

## 2022-04-08 DIAGNOSIS — L0231 Cutaneous abscess of buttock: Secondary | ICD-10-CM | POA: Diagnosis not present

## 2022-04-08 LAB — COMPREHENSIVE METABOLIC PANEL
ALT: 16 U/L (ref 0–44)
AST: 20 U/L (ref 15–41)
Albumin: 3.9 g/dL (ref 3.5–5.0)
Alkaline Phosphatase: 65 U/L (ref 38–126)
Anion gap: 7 (ref 5–15)
BUN: 7 mg/dL (ref 6–20)
CO2: 24 mmol/L (ref 22–32)
Calcium: 9.3 mg/dL (ref 8.9–10.3)
Chloride: 107 mmol/L (ref 98–111)
Creatinine, Ser: 1.05 mg/dL — ABNORMAL HIGH (ref 0.44–1.00)
GFR, Estimated: >60 mL/min (ref >60)
Glucose, Bld: 95 mg/dL (ref 70–99)
Potassium: 3.6 mmol/L (ref 3.5–5.1)
Sodium: 138 mmol/L (ref 135–145)
Total Bilirubin: 0.8 mg/dL (ref 0.3–1.2)
Total Protein: 7.3 g/dL (ref 6.5–8.1)

## 2022-04-08 LAB — I-STAT BETA HCG BLOOD, ED (MC, WL, AP ONLY): I-stat hCG, quantitative: <5 m[IU]/mL (ref <5)

## 2022-04-08 LAB — CBC
HCT: 37.9 % (ref 36.0–46.0)
Hemoglobin: 12.2 g/dL (ref 12.0–15.0)
MCH: 29.4 pg (ref 26.0–34.0)
MCHC: 32.2 g/dL (ref 30.0–36.0)
MCV: 91.3 fL (ref 80.0–100.0)
Platelets: 256 10*3/uL (ref 150–400)
RBC: 4.15 MIL/uL (ref 3.87–5.11)
RDW: 13.8 % (ref 11.5–15.5)
WBC: 4 10*3/uL (ref 4.0–10.5)
nRBC: 0 % (ref 0.0–0.2)

## 2022-04-08 LAB — URINALYSIS, ROUTINE W REFLEX MICROSCOPIC
Bilirubin Urine: NEGATIVE
Glucose, UA: NEGATIVE mg/dL
Hgb urine dipstick: NEGATIVE
Ketones, ur: NEGATIVE mg/dL
Leukocytes,Ua: NEGATIVE
Nitrite: NEGATIVE
Protein, ur: 30 mg/dL — AB
Specific Gravity, Urine: 1.019 (ref 1.005–1.030)
pH: 5 (ref 5.0–8.0)

## 2022-04-08 LAB — LIPASE, BLOOD: Lipase: 39 U/L (ref 11–51)

## 2022-04-08 NOTE — ED Provider Notes (Addendum)
[facility]    CSN: [id number] Arrival date & time: 04/06/22  1714      History   Chief Complaint Chief Complaint  Patient presents with   Abscess    HPI Dawn [name] is a 51 y.o. female with a history of HIV controlled with antiretroviral medications comes to urgent care with 3-day history of cough on the left buttocks.  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  She has had multiple incision and drainage procedures in the past.  Patient's symptoms started 3 days.  Has been persistent.  Pain is of moderate severity and aggravated by palpation with no known relieving factors.  He has tried over-the-counter medications with no improvement in the level of pain or swelling.  No fever or chills.   HPI  Past Medical History:  Diagnosis Date   Anemia    Anxiety    Fibroid    Headache(784.0)    HIV (human immunodeficiency virus infection) ([place])    Infection    UTI    Patient Active Problem List   Diagnosis Date Noted   Enlarged thyroid gland 01/30/2022   Fibroids 07/12/2021   Need for prophylactic vaccination and inoculation against influenza 07/12/2021   Viral illness 01/08/2019   HIV disease ([place]) 03/05/2017   HTN (hypertension) 03/05/2017   Anemia due to chronic blood loss 03/13/2016   Acquired immunodeficiency syndrome ([place]) 12/07/2015   Acne vulgaris 12/07/2015   Episodic mood disorder ([place]) 12/07/2015    Past Surgical History:  Procedure Laterality Date   CESAREAN SECTION     DILATION AND CURETTAGE OF UTERUS     HAND TENDON SURGERY     right thumb   INDUCED ABORTION     TUBAL LIGATION      OB History     Gravida  6   Para  4   Term  4   Preterm      AB  2   Living  4      SAB  1   IAB  1   Ectopic      Multiple      Live Births  4            Home Medications    Prior to Admission medications   Medication Sig Start Date End Date Taking? Authorizing Provider  amoxicillin-clavulanate  (AUGMENTIN) 875-125 MG tablet Take 1 tablet by mouth every 12 (twelve) hours. 04/06/22  Yes Trevin Gartrell, Myrene Galas, MD  albuterol (VENTOLIN HFA) 108 (90 Base) MCG/ACT inhaler Inhale 1 puff into the lungs every 6 (six) hours as needed for wheezing or shortness of breath. 06/14/21   Chase Picket, MD  DESCOVY 200-25 MG tablet TAKE ONE TABLET BY MOUTH DAILY 01/11/22   Carlyle Basques, MD  dicyclomine (BENTYL) 20 MG tablet Take 1 tablet (20 mg total) by mouth 2 (two) times daily for 3 days. 12/26/21 12/29/21  Garald Balding, PA-C  ferrous sulfate 325 (65 FE) MG tablet Take 1 tablet (325 mg total) by mouth daily. 09/04/20   Petrucelli, Glynda Jaeger, PA-C  megestrol (MEGACE) 40 MG tablet Take 1 tablet (40 mg total) by mouth 2 (two) times daily. Can increase to two tablets twice a day in the event of heavy bleeding 03/17/21   Constant, Peggy, MD  ondansetron (ZOFRAN ODT) 4 MG disintegrating tablet Take 1 tablet (4 mg total) by mouth every 8 (eight) hours as needed for nausea or vomiting. Patient not taking: Reported on 11/21/2021  07/06/21   Raspet, Derry Skill, PA-C  potassium chloride SA (KLOR-CON) 20 MEQ tablet Take 1 tablet (20 mEq total) by mouth daily. Patient not taking: Reported on 03/17/2021 09/04/20   Petrucelli, Samantha R, PA-C  PREZCOBIX 800-150 MG tablet TAKE ONE TABLET BY MOUTH DAILY WITH BREAKFAST *SWALLOW WHOLE. DO NOT CRUSH, BREAK OR CHEW TABLETS* 01/11/22   Carlyle Basques, MD  promethazine-dextromethorphan (PROMETHAZINE-DM) 6.25-15 MG/5ML syrup Take 5 mLs by mouth 4 (four) times daily as needed for cough. Patient not taking: Reported on 03/17/2021 08/29/20   Caccavale, Sophia, PA-C  sulfamethoxazole-trimethoprim (BACTRIM DS) 800-160 MG tablet Take 1 tablet by mouth 2 (two) times daily. 03/06/22   Carlyle Basques, MD  valACYclovir (VALTREX) 1000 MG tablet TAKE ONE TABLET BY MOUTH DAILY 02/08/22   Carlyle Basques, MD  diphenhydrAMINE (BENADRYL) 25 MG tablet Take 1 tablet (25 mg total) by mouth every 6 (six) hours as  needed for up to 3 days. 06/17/20 09/04/20  Lannie Fields, PA-C  famotidine (PEPCID) 20 MG tablet Take 1 tablet (20 mg total) by mouth 2 (two) times daily for 5 days. 06/17/20 09/04/20  Lannie Fields, PA-C    Family History Family History  Problem Relation Age of Onset   Breast cancer Neg Hx     Social History Social History   Tobacco Use   Smoking status: Some Days    Types: Cigarettes   Smokeless tobacco: Never  Vaping Use   Vaping Use: Never used  Substance Use Topics   Alcohol use: Yes    Comment: hx of alcohol abuse- social drinker now   Drug use: Yes    Types: Marijuana    Comment: none in 8 yrs     Allergies   Chocolate and Tomato   Review of Systems Review of Systems  Respiratory: Negative.    Cardiovascular: Negative.   Gastrointestinal: Negative.   Musculoskeletal: Negative.   Skin:  Negative for color change, pallor and wound.     Physical Exam Triage Vital Signs ED Triage Vitals  Enc Vitals Group     BP 04/06/22 1806 (!) 146/90     Pulse Rate 04/06/22 1806 99     Resp 04/06/22 1806 17     Temp 04/06/22 1806 98.5 F (36.9 C)     Temp Source 04/06/22 1806 Oral     SpO2 04/06/22 1806 96 %     Weight --      Height --      Head Circumference --      Peak Flow --      Pain Score 04/06/22 1802 5     Pain Loc --      Pain Edu? --      Excl. in [place]? --    No data found.  Updated Vital Signs BP (!) 146/90 (BP Location: Right Arm)   Pulse 99   Temp 98.5 F (36.9 C) (Oral)   Resp 17   LMP 11/10/2021 (Approximate)   SpO2 96%   Visual Acuity Right Eye Distance:   Left Eye Distance:   Bilateral Distance:    Right Eye Near:   Left Eye Near:    Bilateral Near:     Physical Exam   UC Treatments / Results  Labs (all labs ordered are listed, but only abnormal results are displayed) Labs Reviewed - No data to display  EKG   Radiology No results found.  Procedures Incision and Drainage  Date/Time: 04/08/2022 5:05 PM  Performed  by: Jamse Arn  Jenetta Downer, MD Authorized by: Chase Picket, MD   Consent:    Consent obtained:  Verbal   Consent given by:  Patient   Risks discussed:  Bleeding and incomplete drainage Universal protocol:    Patient identity confirmed:  Verbally with patient Location:    Type:  Abscess   Location:  Anogenital   Anogenital location:  Gluteal cleft Pre-procedure details:    Skin preparation:  Povidone-iodine Anesthesia:    Anesthesia method:  Local infiltration   Local anesthetic:  Lidocaine 2% WITH epi Procedure type:    Complexity:  Simple Procedure details:    Ultrasound guidance: no     Needle aspiration: no     Incision types:  Stab incision and single straight   Incision depth:  Subcutaneous   Wound management:  Probed and deloculated   Drainage:  Purulent and bloody   Drainage amount:  Moderate   Wound treatment:  Wound left open   Packing materials:  None Post-procedure details:    Procedure completion:  Tolerated well, no immediate complications  (including critical care time)  Medications Ordered in UC Medications - No data to display  Initial Impression / Assessment and Plan / UC Course  I have reviewed the triage vital signs and the nursing notes.  Pertinent labs & imaging results that were available during my care of the patient were reviewed by me and considered in my medical decision making (see chart for details).     1.  Abscess involving the left buttocks: Incision and drainage completed Sitz bath's recommended Daily wound dressing changes Wound care recommendations given Augmentin 1 tablet twice daily for 7 days Tylenol as needed for pain/or fever Return precautions given. Final Clinical Impressions(s) / UC Diagnoses   Final diagnoses:  Abscess, gluteal, left     Discharge Instructions      Please continue sitz bath's daily Take antibiotics as prescribed Take Tylenol as needed for pain and/or fever If you have worsening swelling,  worsening pain, drainage or redness please return to urgent care to be reevaluated.   ED Prescriptions     Medication Sig Dispense Auth. Provider   amoxicillin-clavulanate (AUGMENTIN) 875-125 MG tablet Take 1 tablet by mouth every 12 (twelve) hours. 14 tablet Herchel Hopkin, Myrene Galas, MD      PDMP not reviewed this encounter.   Chase Picket, MD 04/08/22 1705    Chase Picket, MD 04/08/22 [phone]

## 2022-04-08 NOTE — ED Triage Notes (Signed)
Patient here for generalized abdominal pain that happens intermittently. Reports diarrhea and nausea since this AM after eating some peaches. AOX4.

## 2022-04-09 ENCOUNTER — Emergency Department (HOSPITAL_COMMUNITY): Payer: Commercial Managed Care - HMO

## 2022-04-09 DIAGNOSIS — R197 Diarrhea, unspecified: Secondary | ICD-10-CM | POA: Diagnosis not present

## 2022-04-09 LAB — RESP PANEL BY RT-PCR (FLU A&B, COVID) ARPGX2
Influenza A by PCR: NEGATIVE
Influenza B by PCR: NEGATIVE
SARS Coronavirus 2 by RT PCR: NEGATIVE

## 2022-04-09 MED ORDER — ALUM & MAG HYDROXIDE-SIMETH 200-200-20 MG/5ML PO SUSP
30.0000 mL | Freq: Once | ORAL | Status: AC
  Administered 2022-04-09: 30 mL via ORAL
  Filled 2022-04-09: qty 30

## 2022-04-09 MED ORDER — SUCRALFATE 1 G PO TABS: 1.0000 g | ORAL_TABLET | Freq: Three times a day (TID) | ORAL | 0 refills | Status: DC

## 2022-04-09 MED ORDER — HYDROXYZINE HCL 25 MG PO TABS: 25.0000 mg | ORAL_TABLET | Freq: Three times a day (TID) | ORAL | 0 refills | Status: DC | PRN

## 2022-04-09 MED ORDER — DIAZEPAM 5 MG PO TABS
5.0000 mg | ORAL_TABLET | Freq: Once | ORAL | Status: AC
  Administered 2022-04-09: 5 mg via ORAL
  Filled 2022-04-09: qty 1

## 2022-04-09 MED ORDER — DICYCLOMINE HCL 20 MG PO TABS: 20.0000 mg | ORAL_TABLET | Freq: Two times a day (BID) | ORAL | 0 refills | Status: DC

## 2022-04-09 MED ORDER — LIDOCAINE VISCOUS HCL 2 % MT SOLN
15.0000 mL | Freq: Once | OROMUCOSAL | Status: AC
  Administered 2022-04-09: 15 mL via ORAL
  Filled 2022-04-09: qty 15

## 2022-04-09 NOTE — ED Provider Notes (Signed)
[facility] Provider Note   CSN: [id number] Arrival date & time: 04/08/22  1626     History  Chief Complaint  Patient presents with   Diarrhea   Abdominal Pain   Back Pain    Dawn [name] is a 51 y.o. female.  Patient as above with significant medical history as below, including HIV on antivirals (prior viral load undetectable, follows with ID), fibroids, anxiety, anemia who presents to the ED with complaint of abdominal pain, nausea, diarrhea, among other complaints.  Chart review she was seen 3 days ago urgent care secondary to gluteal abscess left status post I&D. Started on abx at that time.   She was seen 12/26/2021 with similar complaint, abdominal pain, cramping, diarrhea, work-up at that time was unremarkable.  Patient here today for a myriad of complaints.  She is concerned that she is having bloating, cramping to her abdomen, intermittent diarrhea (none in last 24 hours), intermittent nausea, she is having a cough that is nonproductive, he is worried about "her nerves," she is worried her thyroid is acting up, worried that there is something wrong with her eyes, says her skin feels funny.  Patient is also worried that she might be pregnant.  Denies abn vaginal bleeding or discharge. patient reports the symptoms have been ongoing for greater than the past 3 months.  No symptoms were noted in ED visit back in May.  She follows with infectious disease but not has does not primary care doctor.  Denies seeing GI in the past.  Reports that she ate some peaches and a salad earlier today that upset her stomach.  No BRBPR or melena.  No vomiting only having some mild nausea.  She reports that she needs to "get everything checked out while she is here."  Past Medical History:  Diagnosis Date   Anemia    Anxiety    Fibroid    Headache(784.0)    HIV (human immunodeficiency virus infection) ([place])    Infection    UTI    Past Surgical History:   Procedure Laterality Date   CESAREAN SECTION     DILATION AND CURETTAGE OF UTERUS     HAND TENDON SURGERY     right thumb   INDUCED ABORTION     TUBAL LIGATION       The history is provided by the patient. No language interpreter was used.  Diarrhea Associated symptoms: abdominal pain and arthralgias   Associated symptoms: no fever, no headaches and no vomiting   Abdominal Pain Associated symptoms: cough, diarrhea, fatigue and nausea   Associated symptoms: no chest pain, no dysuria, no fever, no shortness of breath and no vomiting   Back Pain Associated symptoms: abdominal pain   Associated symptoms: no chest pain, no dysuria, no fever and no headaches        Home Medications Prior to Admission medications   Medication Sig Start Date End Date Taking? Authorizing Provider  dicyclomine (BENTYL) 20 MG tablet Take 1 tablet (20 mg total) by mouth 2 (two) times daily for 5 days. 04/09/22 04/14/22 Yes Jeanell Sparrow, DO  hydrOXYzine (ATARAX) 25 MG tablet Take 1 tablet (25 mg total) by mouth every 8 (eight) hours as needed. 04/09/22  Yes Wynona Dove A, DO  sucralfate (CARAFATE) 1 g tablet Take 1 tablet (1 g total) by mouth 4 (four) times daily -  with meals and at bedtime for 7 days. 04/09/22 04/16/22 Yes Wynona Dove A, DO  albuterol (VENTOLIN  HFA) 108 (90 Base) MCG/ACT inhaler Inhale 1 puff into the lungs every 6 (six) hours as needed for wheezing or shortness of breath. 06/14/21   Chase Picket, MD  amoxicillin-clavulanate (AUGMENTIN) 875-125 MG tablet Take 1 tablet by mouth every 12 (twelve) hours. 04/06/22   Chase Picket, MD  DESCOVY 200-25 MG tablet TAKE ONE TABLET BY MOUTH DAILY 01/11/22   Carlyle Basques, MD  ferrous sulfate 325 (65 FE) MG tablet Take 1 tablet (325 mg total) by mouth daily. 09/04/20   Petrucelli, Glynda Jaeger, PA-C  megestrol (MEGACE) 40 MG tablet Take 1 tablet (40 mg total) by mouth 2 (two) times daily. Can increase to two tablets twice a day in the event of heavy  bleeding 03/17/21   Constant, Peggy, MD  ondansetron (ZOFRAN ODT) 4 MG disintegrating tablet Take 1 tablet (4 mg total) by mouth every 8 (eight) hours as needed for nausea or vomiting. Patient not taking: Reported on 11/21/2021 07/06/21   Raspet, Junie Panning K, PA-C  potassium chloride SA (KLOR-CON) 20 MEQ tablet Take 1 tablet (20 mEq total) by mouth daily. Patient not taking: Reported on 03/17/2021 09/04/20   Petrucelli, Samantha R, PA-C  PREZCOBIX 800-150 MG tablet TAKE ONE TABLET BY MOUTH DAILY WITH BREAKFAST *SWALLOW WHOLE. DO NOT CRUSH, BREAK OR CHEW TABLETS* 01/11/22   Carlyle Basques, MD  promethazine-dextromethorphan (PROMETHAZINE-DM) 6.25-15 MG/5ML syrup Take 5 mLs by mouth 4 (four) times daily as needed for cough. Patient not taking: Reported on 03/17/2021 08/29/20   Caccavale, Sophia, PA-C  sulfamethoxazole-trimethoprim (BACTRIM DS) 800-160 MG tablet Take 1 tablet by mouth 2 (two) times daily. 03/06/22   Carlyle Basques, MD  valACYclovir (VALTREX) 1000 MG tablet TAKE ONE TABLET BY MOUTH DAILY 02/08/22   Carlyle Basques, MD  diphenhydrAMINE (BENADRYL) 25 MG tablet Take 1 tablet (25 mg total) by mouth every 6 (six) hours as needed for up to 3 days. 06/17/20 09/04/20  Lannie Fields, PA-C  famotidine (PEPCID) 20 MG tablet Take 1 tablet (20 mg total) by mouth 2 (two) times daily for 5 days. 06/17/20 09/04/20  Lannie Fields, PA-C      Allergies    Chocolate and Tomato    Review of Systems   Review of Systems  Constitutional:  Positive for fatigue. Negative for activity change and fever.  HENT:  Positive for congestion. Negative for facial swelling and trouble swallowing.   Eyes:  Negative for discharge and redness.  Respiratory:  Positive for cough. Negative for shortness of breath.   Cardiovascular:  Negative for chest pain, palpitations and leg swelling.  Gastrointestinal:  Positive for abdominal pain, diarrhea and nausea. Negative for vomiting.  Genitourinary:  Negative for difficulty urinating,  dysuria and flank pain.  Musculoskeletal:  Positive for arthralgias. Negative for gait problem.  Skin:  Negative for pallor and rash.  Neurological:  Negative for syncope and headaches.  Psychiatric/Behavioral:  The patient is nervous/anxious.     Physical Exam Updated Vital Signs BP (!) 150/90   Pulse 91   Temp 98.2 F (36.8 C)   Resp 17   LMP 11/10/2021 (Approximate)   SpO2 98%  Physical Exam Vitals and nursing note reviewed.  Constitutional:      General: She is not in acute distress.    Appearance: Normal appearance. She is well-developed. She is not ill-appearing or diaphoretic.  HENT:     Head: Normocephalic and atraumatic. No right periorbital erythema or left periorbital erythema.     Jaw: No trismus.  Right Ear: External ear normal.     Left Ear: External ear normal.     Nose: Nose normal.     Mouth/Throat:     Mouth: Mucous membranes are moist.  Eyes:     General: No scleral icterus.       Right eye: No discharge.        Left eye: No discharge.  Neck:     Trachea: Trachea normal.  Cardiovascular:     Rate and Rhythm: Normal rate and regular rhythm.     Pulses: Normal pulses.     Heart sounds: Normal heart sounds.     No S3 or S4 sounds.  Pulmonary:     Effort: Pulmonary effort is normal. No tachypnea, accessory muscle usage or respiratory distress.     Breath sounds: Normal breath sounds. No stridor. No decreased breath sounds.  Abdominal:     General: Abdomen is flat. Bowel sounds are normal. There is no distension or abdominal bruit.     Palpations: Abdomen is soft.     Tenderness: There is no abdominal tenderness. There is no guarding or rebound.  Musculoskeletal:        General: Normal range of motion.     Cervical back: Full passive range of motion without pain and normal range of motion.     Right lower leg: No edema.     Left lower leg: No edema.  Skin:    General: Skin is warm and dry.     Capillary Refill: Capillary refill takes less than 2  seconds.  Neurological:     Mental Status: She is alert and oriented to person, place, and time.     GCS: GCS eye subscore is 4. GCS verbal subscore is 5. GCS motor subscore is 6.  Psychiatric:        Mood and Affect: Mood normal.        Behavior: Behavior normal.     ED Results / Procedures / Treatments   Labs (all labs ordered are listed, but only abnormal results are displayed) Labs Reviewed  COMPREHENSIVE METABOLIC PANEL - Abnormal; Notable for the following components:      Result Value   Creatinine, Ser 1.05 (*)    All other components within normal limits  URINALYSIS, ROUTINE W REFLEX MICROSCOPIC - Abnormal; Notable for the following components:   APPearance HAZY (*)    Protein, ur 30 (*)    Bacteria, UA RARE (*)    All other components within normal limits  RESP PANEL BY RT-PCR (FLU A&B, COVID) ARPGX2  LIPASE, BLOOD  CBC  I-STAT BETA HCG BLOOD, ED ([facility], [facility], [facility] ONLY)    EKG None  Radiology DG Chest Portable 1 View  Result Date: 04/09/2022 CLINICAL DATA:  Cough EXAM: PORTABLE CHEST 1 VIEW COMPARISON:  CT and radiographs 03/26/2022 FINDINGS: Prominent right paratracheal stripe is unchanged and compatible with nodular thyroid enlargement seen on prior CT. The cardiomediastinal silhouette is otherwise unremarkable. No pleural effusion or pneumothorax. No focal consolidation. No acute osseous abnormality. IMPRESSION: No active disease. Electronically Signed   By: Placido Sou M.D.   On: 04/09/2022 01:51    Procedures Procedures    Medications Ordered in ED Medications  diazepam (VALIUM) tablet 5 mg (5 mg Oral Given 04/09/22 0153)  alum & mag hydroxide-simeth (MAALOX/MYLANTA) 200-200-20 MG/5ML suspension 30 mL (30 mLs Oral Given 04/09/22 0153)    And  lidocaine (XYLOCAINE) 2 % viscous mouth solution 15 mL (15 mLs Oral Given 04/09/22 0153)  ED Course/ Medical Decision Making/ A&P                           Medical Decision Making Amount and/or Complexity of Data  Reviewed Labs: ordered. Radiology: ordered.  Risk OTC drugs. Prescription drug management.   This patient presents to the ED with chief complaint(s) of multiple complaints with pertinent past medical history of IV, anxiety which further complicates the presenting complaint. The complaint involves an extensive differential diagnosis and also carries with it a high risk of complications and morbidity.    The differential diagnosis includes but not limited to viral syndrome, electrolyte derangement, infectious, metabolic, dehydration, psychogenic, other acute etiologies were considered. Serious etiologies were considered.   The initial plan is to screening labs ordered in triage, also obtain chest x-ray and COVID test.   Additional history obtained: Additional history obtained from  n/a Records reviewed previous admission documents and prior ED visits, prior labs and imaging  Independent labs interpretation:  The following labs were independently interpreted: CMP with creatinine 1.05.  Which is similar to baseline.  CBC without significant derangement.  Pregnant 8.  UA without evidence of infection or severe dehydration.  Lipase is normal.  Independent visualization of imaging: - I independently visualized the following imaging with scope of interpretation limited to determining acute life threatening conditions related to emergency care: Chest x-ray, which revealed no acute process  Cardiac monitoring was reviewed and interpreted by myself which shows n/a  Treatment and Reassessment: Valium and Maalox  Consultation: - Consulted or discussed management/test interpretation w/ external professional: n/a  Consideration for admission or further workup: Admission was considered feel patient benefit from outpatient management of her chronic conditions.  I have low suspicion for an acute emergent condition at this time and these conditions have been present for greater than 3 months and per  patient appear to be unchanged.   Advised patient refrain from tobacco and marijuana use.  Refrain from alcohol use.  Start patient on hydroxyzine as needed for home also start Carafate, recommend bland diet.  Follow-up with PCP and gastroenterologist.  Social Determinants of health: Counseled patient for approximately 3 minutes regarding smoking cessation. Discussed risks of smoking and how they applied and affected their visit here today. Patient not ready to quit at this time, however will follow up with their primary doctor when they are.   CPT code: 97068: intermediate counseling for smoking cessation   Social History   Tobacco Use   Smoking status: Some Days    Types: Cigarettes   Smokeless tobacco: Never  Vaping Use   Vaping Use: Never used  Substance Use Topics   Alcohol use: Yes    Comment: hx of alcohol abuse- social drinker now   Drug use: Yes    Types: Marijuana    Comment: none in 8 yrs     The patient improved significantly and was discharged in stable condition. Detailed discussions were had with the patient regarding current findings, and need for close f/u with PCP or on call doctor. The patient has been instructed to return immediately if the symptoms worsen in any way for re-evaluation. Patient verbalized understanding and is in agreement with current care plan. All questions answered prior to discharge.         Final Clinical Impression(s) / ED Diagnoses Final diagnoses:  Anxiety about health  Abdominal pain, unspecified abdominal location    Rx / DC Orders ED Discharge Orders  Ordered    sucralfate (CARAFATE) 1 g tablet  3 times daily with meals & bedtime        04/09/22 0201    hydrOXYzine (ATARAX) 25 MG tablet  Every 8 hours PRN        04/09/22 0201    dicyclomine (BENTYL) 20 MG tablet  2 times daily        04/09/22 0201              Jeanell Sparrow, DO 04/09/22 0203

## 2022-04-09 NOTE — Discharge Instructions (Addendum)
It was a pleasure caring for you today in the emergency department.  Please return to the emergency department for any worsening or worrisome symptoms.  Please follow-up your COVID-19 test results on MyChart

## 2022-04-09 NOTE — ED Notes (Signed)
Pt verbalized understanding of d/c instructions, meds, and followup care. Denies questions. VSS, no distress noted. Steady gait to exit with all belongings. Bus in morning.

## 2022-04-17 ENCOUNTER — Other Ambulatory Visit: Payer: Self-pay

## 2022-04-17 ENCOUNTER — Ambulatory Visit (INDEPENDENT_AMBULATORY_CARE_PROVIDER_SITE_OTHER): Payer: Commercial Managed Care - HMO | Admitting: Internal Medicine

## 2022-04-17 ENCOUNTER — Encounter: Payer: Self-pay | Admitting: Internal Medicine

## 2022-04-17 VITALS — BP 136/86 | HR 91 | Temp 98.2°F | Ht 64.0 in | Wt 171.0 lb

## 2022-04-17 DIAGNOSIS — N926 Irregular menstruation, unspecified: Secondary | ICD-10-CM | POA: Diagnosis not present

## 2022-04-17 DIAGNOSIS — Z0000 Encounter for general adult medical examination without abnormal findings: Secondary | ICD-10-CM

## 2022-04-17 DIAGNOSIS — B20 Human immunodeficiency virus [HIV] disease: Secondary | ICD-10-CM | POA: Diagnosis not present

## 2022-04-17 DIAGNOSIS — F329 Major depressive disorder, single episode, unspecified: Secondary | ICD-10-CM | POA: Diagnosis not present

## 2022-04-17 DIAGNOSIS — Z7689 Persons encountering health services in other specified circumstances: Secondary | ICD-10-CM | POA: Diagnosis not present

## 2022-04-17 MED ORDER — FLUOXETINE HCL 20 MG PO CAPS
20.0000 mg | ORAL_CAPSULE | Freq: Every day | ORAL | 11 refills | Status: DC
Start: 2022-04-17 — End: 2023-03-15

## 2022-04-17 NOTE — Progress Notes (Signed)
Patient ID: Dawn [name], female   DOB: [birth year], 51 y.o.   MRN: [id number]  HPI  Dawn [name] is a 51yo F with well controlled HIV disease, with[place]-descovy-prezcobix;she states that   Recently had numerous labs done to exclude pregnancy.. " I feel heart bearts, like I am pregnant." Last menstrual cycle in June. Has had repeated pregnancy test that were negative. Distraught.  Outpatient Encounter Medications as of 04/17/2022  Medication Sig   albuterol (VENTOLIN HFA) 108 (90 Base) MCG/ACT inhaler Inhale 1 puff into the lungs every 6 (six) hours as needed for wheezing or shortness of breath.   amoxicillin-clavulanate (AUGMENTIN) 875-125 MG tablet Take 1 tablet by mouth every 12 (twelve) hours.   DESCOVY 200-25 MG tablet TAKE ONE TABLET BY MOUTH DAILY   ferrous sulfate 325 (65 FE) MG tablet Take 1 tablet (325 mg total) by mouth daily.   hydrOXYzine (ATARAX) 25 MG tablet Take 1 tablet (25 mg total) by mouth every 8 (eight) hours as needed.   megestrol (MEGACE) 40 MG tablet Take 1 tablet (40 mg total) by mouth 2 (two) times daily. Can increase to two tablets twice a day in the event of heavy bleeding   ondansetron (ZOFRAN ODT) 4 MG disintegrating tablet Take 1 tablet (4 mg total) by mouth every 8 (eight) hours as needed for nausea or vomiting.   potassium chloride SA (KLOR-CON) 20 MEQ tablet Take 1 tablet (20 mEq total) by mouth daily.   PREZCOBIX 800-150 MG tablet TAKE ONE TABLET BY MOUTH DAILY WITH BREAKFAST *SWALLOW WHOLE. DO NOT CRUSH, BREAK OR CHEW TABLETS*   promethazine-dextromethorphan (PROMETHAZINE-DM) 6.25-15 MG/5ML syrup Take 5 mLs by mouth 4 (four) times daily as needed for cough.   sulfamethoxazole-trimethoprim (BACTRIM DS) 800-160 MG tablet Take 1 tablet by mouth 2 (two) times daily.   valACYclovir (VALTREX) 1000 MG tablet TAKE ONE TABLET BY MOUTH DAILY   dicyclomine (BENTYL) 20 MG tablet Take 1 tablet (20 mg total) by mouth 2 (two) times daily for 5 days.   sucralfate  (CARAFATE) 1 g tablet Take 1 tablet (1 g total) by mouth 4 (four) times daily -  with meals and at bedtime for 7 days.   [DISCONTINUED] diphenhydrAMINE (BENADRYL) 25 MG tablet Take 1 tablet (25 mg total) by mouth every 6 (six) hours as needed for up to 3 days.   [DISCONTINUED] famotidine (PEPCID) 20 MG tablet Take 1 tablet (20 mg total) by mouth 2 (two) times daily for 5 days.   No facility-administered encounter medications on file as of 04/17/2022.     Patient Active Problem List   Diagnosis Date Noted   Enlarged thyroid gland 01/30/2022   Fibroids 07/12/2021   Need for prophylactic vaccination and inoculation against influenza 07/12/2021   Viral illness 01/08/2019   HIV disease ([place]) 03/05/2017   HTN (hypertension) 03/05/2017   Anemia due to chronic blood loss 03/13/2016   Acquired immunodeficiency syndrome ([place]) 12/07/2015   Acne vulgaris 12/07/2015   Episodic mood disorder ([place]) 12/07/2015     Health Maintenance Due  Topic Date Due   COLONOSCOPY (Pts 45-29yr Insurance coverage will need to be confirmed)  Never done   COVID-19 Vaccine (2 - Pfizer risk series) 08/16/2020   INFLUENZA VACCINE  03/28/2022   PAP SMEAR-Modifier  05/13/2022     Review of Systems 12 point ros is negative except what is mentioned above Physical Exam   BP 136/86   Pulse 91   Temp 98.2 F (36.8 C) (Temporal)   Ht 5'  4" (1.626 m)   Wt 171 lb (77.6 kg)   LMP 11/10/2021 (Approximate)   BMI 29.35 kg/m   Physical Exam  Constitutional:  oriented to person, place, and time. appears well-developed and well-nourished. No distress.  HENT: [place]/AT, PERRLA, no scleral icterus Mouth/Throat: Oropharynx is clear and moist. No oropharyngeal exudate.  Cardiovascular: Normal rate, regular rhythm and normal heart sounds. Exam reveals no gallop and no friction rub.  No murmur heard.  Pulmonary/Chest: Effort normal and breath sounds normal. No respiratory distress.  has no wheezes.  Neck = supple, no nuchal  rigidity Abdominal: Soft. Bowel sounds are normal.  exhibits no distension. There is no tenderness.  Lymphadenopathy: no cervical adenopathy. No axillary adenopathy Neurological: alert and oriented to person, place, and time.  Skin: Skin is warm and dry. No rash noted. No erythema.  Psychiatric: distraught  Lab Results  Component Value Date   CD4TCELL SEE SEPARATE REPORT 12/26/2021   Lab Results  Component Value Date   CD4TABS 746 07/12/2021   CD4TABS 537 02/02/2021   CD4TABS 396 (L) 07/26/2020   Lab Results  Component Value Date   HIV1RNAQUANT NOT DETECTED 01/10/2022   Lab Results  Component Value Date   HEPBSAB POS (A) 12/09/2015   Lab Results  Component Value Date   LABRPR NON-REACTIVE 01/10/2022    CBC Lab Results  Component Value Date   WBC 4.0 04/08/2022   RBC 4.15 04/08/2022   HGB 12.2 04/08/2022   HCT 37.9 04/08/2022   PLT 256 04/08/2022   MCV 91.3 04/08/2022   MCH 29.4 04/08/2022   MCHC 32.2 04/08/2022   RDW 13.8 04/08/2022   LYMPHSABS 1.2 03/26/2022   MONOABS 0.3 03/26/2022   EOSABS 0.0 03/26/2022    BMET Lab Results  Component Value Date   NA 138 04/08/2022   K 3.6 04/08/2022   CL 107 04/08/2022   CO2 24 04/08/2022   GLUCOSE 95 04/08/2022   BUN 7 04/08/2022   CREATININE 1.05 (H) 04/08/2022   CALCIUM 9.3 04/08/2022   GFRNONAA >60 04/08/2022   GFRAA 87 02/02/2021      Assessment and Plan  Major depression/anxiety = will start on fluoxetine '20mg'$  ; refer to psychiatry  Long term medication = cr is stable  Hiv disease = check labs to see if well-controlled  Reassure that she is not pregnant

## 2022-04-18 DIAGNOSIS — Z3200 Encounter for pregnancy test, result unknown: Secondary | ICD-10-CM | POA: Diagnosis not present

## 2022-04-18 DIAGNOSIS — Z3009 Encounter for other general counseling and advice on contraception: Secondary | ICD-10-CM | POA: Diagnosis not present

## 2022-04-18 DIAGNOSIS — Z7689 Persons encountering health services in other specified circumstances: Secondary | ICD-10-CM | POA: Diagnosis not present

## 2022-04-18 LAB — T-HELPER CELL (CD4) - (RCID CLINIC ONLY)
CD4 % Helper T Cell: 44 % (ref 33–65)
CD4 T Cell Abs: 565 /uL (ref 400–1790)

## 2022-04-20 LAB — HIV-1 RNA QUANT-NO REFLEX-BLD
HIV 1 RNA Quant: NOT DETECTED Copies/mL
HIV-1 RNA Quant, Log: NOT DETECTED Log cps/mL

## 2022-05-05 ENCOUNTER — Other Ambulatory Visit: Payer: Self-pay | Admitting: Pharmacist

## 2022-05-05 DIAGNOSIS — B20 Human immunodeficiency virus [HIV] disease: Secondary | ICD-10-CM

## 2022-05-08 ENCOUNTER — Telehealth: Payer: Self-pay

## 2022-05-08 DIAGNOSIS — L0292 Furuncle, unspecified: Secondary | ICD-10-CM

## 2022-05-08 MED ORDER — SULFAMETHOXAZOLE-TRIMETHOPRIM 800-160 MG PO TABS: 1.0000 | ORAL_TABLET | Freq: Two times a day (BID) | ORAL | 0 refills | Status: AC

## 2022-05-08 NOTE — Addendum Note (Signed)
Addended by: Leatrice Jewels on: 05/08/2022 12:15 PM   Modules accepted: Orders

## 2022-05-08 NOTE — Telephone Encounter (Signed)
Patient left voicemail stating she needs refills on antibiotics for boil. Would like to know if provider could send this into [facility] today. Boil appeared on Saturday. Not draining but is swollen and painful to touch.  Would like to know if she could have referral sent to a provider who can follow up on reoccurring boils.  Leatrice Jewels, RMA

## 2022-05-08 NOTE — Telephone Encounter (Signed)
Per provider okay to send in refills for 7 days. Would like to hold on referral until patient has completed antibiotics. Leatrice Jewels, RMA

## 2022-05-22 ENCOUNTER — Other Ambulatory Visit: Payer: Self-pay

## 2022-05-22 ENCOUNTER — Ambulatory Visit (INDEPENDENT_AMBULATORY_CARE_PROVIDER_SITE_OTHER): Payer: Commercial Managed Care - HMO | Admitting: Internal Medicine

## 2022-05-22 ENCOUNTER — Encounter: Payer: Self-pay | Admitting: Internal Medicine

## 2022-05-22 VITALS — BP 125/86 | HR 85 | Temp 98.2°F | Ht 64.0 in | Wt 168.0 lb

## 2022-05-22 DIAGNOSIS — E041 Nontoxic single thyroid nodule: Secondary | ICD-10-CM | POA: Diagnosis not present

## 2022-05-22 DIAGNOSIS — S31829D Unspecified open wound of left buttock, subsequent encounter: Secondary | ICD-10-CM | POA: Diagnosis not present

## 2022-05-22 DIAGNOSIS — B20 Human immunodeficiency virus [HIV] disease: Secondary | ICD-10-CM | POA: Diagnosis not present

## 2022-05-22 DIAGNOSIS — Z23 Encounter for immunization: Secondary | ICD-10-CM

## 2022-05-22 DIAGNOSIS — L0292 Furuncle, unspecified: Secondary | ICD-10-CM

## 2022-05-22 DIAGNOSIS — L0231 Cutaneous abscess of buttock: Secondary | ICD-10-CM

## 2022-05-22 MED ORDER — DOXYCYCLINE HYCLATE 100 MG PO TABS: 100.0000 mg | ORAL_TABLET | Freq: Two times a day (BID) | ORAL | 0 refills | Status: DC

## 2022-05-22 NOTE — Progress Notes (Addendum)
RFV: follow up for hiv disease  Patient ID: Dawn [name], female   DOB: 10-28-1970, 51 y.o.   MRN: [id number]  HPI Dawn [name] is a 51yo F with well controlled hiv disease on descovy-prezcobix with good adherence.she is concerned about having a boil with increasing drainage- serous on right buttock. She has had it previously I x D. She also reports increasing size of thyroid  Outpatient Encounter Medications as of 05/22/2022  Medication Sig   albuterol (VENTOLIN HFA) 108 (90 Base) MCG/ACT inhaler Inhale 1 puff into the lungs every 6 (six) hours as needed for wheezing or shortness of breath.   DESCOVY 200-25 MG tablet TAKE ONE TABLET BY MOUTH DAILY   ferrous sulfate 325 (65 FE) MG tablet Take 1 tablet (325 mg total) by mouth daily.   FLUoxetine (PROZAC) 20 MG capsule Take 1 capsule (20 mg total) by mouth daily.   hydrOXYzine (ATARAX) 25 MG tablet Take 1 tablet (25 mg total) by mouth every 8 (eight) hours as needed.   megestrol (MEGACE) 40 MG tablet Take 1 tablet (40 mg total) by mouth 2 (two) times daily. Can increase to two tablets twice a day in the event of heavy bleeding   ondansetron (ZOFRAN ODT) 4 MG disintegrating tablet Take 1 tablet (4 mg total) by mouth every 8 (eight) hours as needed for nausea or vomiting.   potassium chloride SA (KLOR-CON) 20 MEQ tablet Take 1 tablet (20 mEq total) by mouth daily.   PREZCOBIX 800-150 MG tablet TAKE ONE TABLET BY MOUTH DAILY WITH BREAKFAST *SWALLOW WHOLE. DO NOT CRUSH, BREAK OR CHEW TABLETS*   promethazine-dextromethorphan (PROMETHAZINE-DM) 6.25-15 MG/5ML syrup Take 5 mLs by mouth 4 (four) times daily as needed for cough.   valACYclovir (VALTREX) 1000 MG tablet TAKE ONE TABLET BY MOUTH DAILY   dicyclomine (BENTYL) 20 MG tablet Take 1 tablet (20 mg total) by mouth 2 (two) times daily for 5 days.   sucralfate (CARAFATE) 1 g tablet Take 1 tablet (1 g total) by mouth 4 (four) times daily -  with meals and at bedtime for 7 days.   [DISCONTINUED]  amoxicillin-clavulanate (AUGMENTIN) 875-125 MG tablet Take 1 tablet by mouth every 12 (twelve) hours. (Patient not taking: Reported on 05/22/2022)   [DISCONTINUED] diphenhydrAMINE (BENADRYL) 25 MG tablet Take 1 tablet (25 mg total) by mouth every 6 (six) hours as needed for up to 3 days.   [DISCONTINUED] famotidine (PEPCID) 20 MG tablet Take 1 tablet (20 mg total) by mouth 2 (two) times daily for 5 days.   No facility-administered encounter medications on file as of 05/22/2022.     Patient Active Problem List   Diagnosis Date Noted   Enlarged thyroid gland 01/30/2022   Fibroids 07/12/2021   Need for prophylactic vaccination and inoculation against influenza 07/12/2021   Viral illness 01/08/2019   HIV disease ([place]) 03/05/2017   HTN (hypertension) 03/05/2017   Anemia due to chronic blood loss 03/13/2016   Acquired immunodeficiency syndrome ([place]) 12/07/2015   Acne vulgaris 12/07/2015   Episodic mood disorder ([place]) 12/07/2015     Health Maintenance Due  Topic Date Due   COLONOSCOPY (Pts 45-47yr Insurance coverage will need to be confirmed)  Never done   COVID-19 Vaccine (2 - Pfizer risk series) 08/16/2020   INFLUENZA VACCINE  03/28/2022   PAP SMEAR-Modifier  05/13/2022     Review of Systems 12 point ros is negative except what is mentioned in hpi Physical Exam   BP 125/86   Pulse 85   Temp 98.2  F (36.8 C) (Temporal)   Ht '5\' 4"'$  (1.626 m)   Wt 168 lb (76.2 kg)   LMP 11/10/2021 (Approximate)   BMI 28.84 kg/m   Physical Exam  Constitutional:  oriented to person, place, and time. appears well-developed and well-nourished. No distress.  HENT: [place]/AT, PERRLA, no scleral icterus Mouth/Throat: Oropharynx is clear and moist. No oropharyngeal exudate.  Cardiovascular: Normal rate, regular rhythm and normal heart sounds. Exam reveals no gallop and no friction rub.  No murmur heard.  Pulmonary/Chest: Effort normal and breath sounds normal. No respiratory distress.  has no wheezes.   Neck = supple, no nuchal rigidity. + thyromegaly. +nodule Abdominal: Soft. Bowel sounds are normal.  exhibits no distension. There is no tenderness.  Lymphadenopathy: no cervical adenopathy. No axillary adenopathy Neurological: alert and oriented to person, place, and time.  Skin: 2 right shallow ulcer. Some induration. No purulence. Serous. nontender Psychiatric: a normal mood and affect.  behavior is normal.   Lab Results  Component Value Date   CD4TCELL 44 04/17/2022   Lab Results  Component Value Date   CD4TABS 565 04/17/2022   CD4TABS 746 07/12/2021   CD4TABS 537 02/02/2021   Lab Results  Component Value Date   HIV1RNAQUANT Not Detected 04/17/2022   Lab Results  Component Value Date   HEPBSAB POS (A) 12/09/2015   Lab Results  Component Value Date   LABRPR NON-REACTIVE 01/10/2022    CBC Lab Results  Component Value Date   WBC 4.0 04/08/2022   RBC 4.15 04/08/2022   HGB 12.2 04/08/2022   HCT 37.9 04/08/2022   PLT 256 04/08/2022   MCV 91.3 04/08/2022   MCH 29.4 04/08/2022   MCHC 32.2 04/08/2022   RDW 13.8 04/08/2022   LYMPHSABS 1.2 03/26/2022   MONOABS 0.3 03/26/2022   EOSABS 0.0 03/26/2022    BMET Lab Results  Component Value Date   NA 138 04/08/2022   K 3.6 04/08/2022   CL 107 04/08/2022   CO2 24 04/08/2022   GLUCOSE 95 04/08/2022   BUN 7 04/08/2022   CREATININE 1.05 (H) 04/08/2022   CALCIUM 9.3 04/08/2022   GFRNONAA >60 04/08/2022   GFRAA 87 02/02/2021      Assessment and Plan Hiv disease= well controlled, cd 4 count of 853/VL<20.contiue on current regimen of descovy-prezcobix   Thyroid nodule = now difficulty with swelling; she has had it imaged and u/s biopsy in 2021 which was negative for malignant cells but has not had follow up.  Buttock lesion = indurated, ?keloid with occasionally; swab for herpes; can redo doxycycline '100mg'$  bid #20. Appears to be consistent with boil rather than hsv lesion however non-healing. Will check for  HSV  Refer to primary care/also has appt with ENT   Spent 40 min with patient coordinating care, review past records and discussing treatment plan

## 2022-05-26 LAB — HERPES SIMPLEX VIRUS CULTURE
MICRO NUMBER:: 13963855
SPECIMEN QUALITY:: ADEQUATE

## 2022-05-27 ENCOUNTER — Other Ambulatory Visit: Payer: Self-pay | Admitting: Internal Medicine

## 2022-05-27 DIAGNOSIS — B20 Human immunodeficiency virus [HIV] disease: Secondary | ICD-10-CM

## 2022-05-29 NOTE — Telephone Encounter (Signed)
Dr. Marinello Flattery to review.   Beryle Flock, RN

## 2022-06-13 NOTE — Congregational Nurse Program (Signed)
  Dept: [phone]   Congregational Nurse Program Note  Date of Encounter: 06/13/2022  Clinic visit for BP check, concern that she does not have any medication for BP and needs a primary care physician. BP 162/90, pulse 74 and regular, O2 Sat 98%.  Notified infectious disease Dr. Porta Flattery of elevated BP, appointment made for 10/26 at 2P.  PCP initial visit with Dr. Redmond Pulling at [facility] on 09/04/2022 Past Medical History: Past Medical History:  Diagnosis Date   Anemia    Anxiety    Fibroid    Headache(784.0)    HIV (human immunodeficiency virus infection) ([place])    Infection    UTI    Encounter Details:  CNP Questionnaire - 06/13/22 1335       Questionnaire   Ask client: Do you give verbal consent for me to treat you today? Yes    Student Assistance N/A    Location Patient [place]    Visit Setting with Client Organization    Patient Status Unknown   Has apartment at [place] or [place]    Insurance/Financial Assistance Referral N/A    Medication N/A    Medical Provider Yes    Screening Referrals Made Annual Wellness Visit    Medical Referrals Made [facility]    Medical Appointment Made [facility] P[facility]    Recently w/o PCP, now 1st time PCP visit completed due to CNs referral or appointment made N/A    Food N/A    Transportation Need transportation assistance    Housing/Utilities N/A    Interpersonal Safety N/A    Interventions [place];Advocate/Support;Spiritual Care    Abnormal to Normal Screening Since Last CN Visit N/A    Screenings CN Performed Blood Pressure;Weight;Pulse Ox    Sent Client to Lab for: N/A    Did client attend any of the following based off CNs referral or appointments made? N/A    ED Visit Averted N/A    Life-Saving Intervention Made N/A

## 2022-06-19 ENCOUNTER — Ambulatory Visit (HOSPITAL_COMMUNITY): Payer: Self-pay | Admitting: Licensed Clinical Social Worker

## 2022-06-21 ENCOUNTER — Ambulatory Visit (HOSPITAL_COMMUNITY): Payer: Self-pay | Admitting: Licensed Clinical Social Worker

## 2022-06-22 ENCOUNTER — Other Ambulatory Visit: Payer: Self-pay

## 2022-06-22 ENCOUNTER — Encounter: Payer: Self-pay | Admitting: Internal Medicine

## 2022-06-22 ENCOUNTER — Ambulatory Visit (INDEPENDENT_AMBULATORY_CARE_PROVIDER_SITE_OTHER): Payer: Medicaid Other

## 2022-06-22 ENCOUNTER — Ambulatory Visit (INDEPENDENT_AMBULATORY_CARE_PROVIDER_SITE_OTHER): Payer: Medicaid Other | Admitting: Internal Medicine

## 2022-06-22 VITALS — BP 145/90 | HR 88 | Temp 97.7°F | Ht 64.0 in | Wt 156.0 lb

## 2022-06-22 DIAGNOSIS — M25552 Pain in left hip: Secondary | ICD-10-CM

## 2022-06-22 DIAGNOSIS — N921 Excessive and frequent menstruation with irregular cycle: Secondary | ICD-10-CM

## 2022-06-22 DIAGNOSIS — E041 Nontoxic single thyroid nodule: Secondary | ICD-10-CM

## 2022-06-22 DIAGNOSIS — Z23 Encounter for immunization: Secondary | ICD-10-CM

## 2022-06-22 DIAGNOSIS — M25551 Pain in right hip: Secondary | ICD-10-CM

## 2022-06-22 MED ORDER — HYDROCHLOROTHIAZIDE 25 MG PO TABS: 12.5000 mg | ORAL_TABLET | Freq: Every day | ORAL | 11 refills | Status: DC

## 2022-06-22 NOTE — Progress Notes (Signed)
Patient ID: Dawn [name], female   DOB: May 19, 1971, 51 y.o.   MRN: [id number]  HPI 51yo F with well controlled hiv disease, CD 4 count of 565/VL<16 Apr 2022  At last visit in sept, she had boil on buttock, given short course of doxycycline.   Had elevated BP elevated at [facility].   ROS: menorrhagia  Outpatient Encounter Medications as of 06/22/2022  Medication Sig   albuterol (VENTOLIN HFA) 108 (90 Base) MCG/ACT inhaler Inhale 1 puff into the lungs every 6 (six) hours as needed for wheezing or shortness of breath.   DESCOVY 200-25 MG tablet TAKE ONE TABLET BY MOUTH DAILY   doxycycline (VIBRA-TABS) 100 MG tablet Take 1 tablet (100 mg total) by mouth 2 (two) times daily.   ferrous sulfate 325 (65 FE) MG tablet Take 1 tablet (325 mg total) by mouth daily.   FLUoxetine (PROZAC) 20 MG capsule Take 1 capsule (20 mg total) by mouth daily.   hydrOXYzine (ATARAX) 25 MG tablet Take 1 tablet (25 mg total) by mouth every 8 (eight) hours as needed.   megestrol (MEGACE) 40 MG tablet Take 1 tablet (40 mg total) by mouth 2 (two) times daily. Can increase to two tablets twice a day in the event of heavy bleeding   ondansetron (ZOFRAN ODT) 4 MG disintegrating tablet Take 1 tablet (4 mg total) by mouth every 8 (eight) hours as needed for nausea or vomiting.   potassium chloride SA (KLOR-CON) 20 MEQ tablet Take 1 tablet (20 mEq total) by mouth daily.   PREZCOBIX 800-150 MG tablet TAKE ONE TABLET BY MOUTH DAILY WITH BREAKFAST *SWALLOW WHOLE. DO NOT CRUSH, BREAK OR CHEW TABLETS*   promethazine-dextromethorphan (PROMETHAZINE-DM) 6.25-15 MG/5ML syrup Take 5 mLs by mouth 4 (four) times daily as needed for cough.   valACYclovir (VALTREX) 1000 MG tablet TAKE ONE TABLET BY MOUTH DAILY   dicyclomine (BENTYL) 20 MG tablet Take 1 tablet (20 mg total) by mouth 2 (two) times daily for 5 days.   sucralfate (CARAFATE) 1 g tablet Take 1 tablet (1 g total) by mouth 4 (four) times daily -   with meals and at bedtime for 7 days.   [DISCONTINUED] diphenhydrAMINE (BENADRYL) 25 MG tablet Take 1 tablet (25 mg total) by mouth every 6 (six) hours as needed for up to 3 days.   [DISCONTINUED] famotidine (PEPCID) 20 MG tablet Take 1 tablet (20 mg total) by mouth 2 (two) times daily for 5 days.   No facility-administered encounter medications on file as of 06/22/2022.     Patient Active Problem List   Diagnosis Date Noted   Enlarged thyroid gland 01/30/2022   Fibroids 07/12/2021   Need for prophylactic vaccination and inoculation against influenza 07/12/2021   Viral illness 01/08/2019   HIV disease ([place]) 03/05/2017   HTN (hypertension) 03/05/2017   Anemia due to chronic blood loss 03/13/2016   Acquired immunodeficiency syndrome ([place]) 12/07/2015   Acne vulgaris 12/07/2015   Episodic mood disorder ([place]) 12/07/2015     Health Maintenance Due  Topic Date Due   COLONOSCOPY (Pts 49-60yr Insurance coverage will need to be confirmed)  Never done   COVID-19 Vaccine (2 - Pfizer risk series) 08/16/2020   PAP SMEAR-Modifier  05/13/2022     Review of Systems  Physical Exam   Wt 156 lb (70.8 kg)   LMP 11/10/2021 (Approximate)   BMI 26.78 kg/m   Physical Exam  Constitutional:  oriented to person, place, and time. appears well-developed and well-nourished.  No distress.  HENT: [place]/AT, PERRLA, no scleral icterus Mouth/Throat: Oropharynx is clear and moist. No oropharyngeal exudate.  Cardiovascular: Normal rate, regular rhythm and normal heart sounds. Exam reveals no gallop and no friction rub.  No murmur heard.  Pulmonary/Chest: Effort normal and breath sounds normal. No respiratory distress.  has no wheezes.  Neck = supple, no nuchal rigidity. + enlarged thyroid Abdominal: Soft. Bowel sounds are normal.  exhibits no distension. There is no tenderness.  Lymphadenopathy: no cervical adenopathy. No axillary adenopathy Neurological: alert and oriented to person, place, and time.  Skin:  Skin is warm and dry. No rash noted. No erythema.  Psychiatric: a normal mood and affect.  behavior is normal.   Lab Results  Component Value Date   CD4TCELL 44 04/17/2022   Lab Results  Component Value Date   CD4TABS 565 04/17/2022   CD4TABS 746 07/12/2021   CD4TABS 537 02/02/2021   Lab Results  Component Value Date   HIV1RNAQUANT Not Detected 04/17/2022   Lab Results  Component Value Date   HEPBSAB POS (A) 12/09/2015   Lab Results  Component Value Date   LABRPR NON-REACTIVE 01/10/2022    CBC Lab Results  Component Value Date   WBC 4.0 04/08/2022   RBC 4.15 04/08/2022   HGB 12.2 04/08/2022   HCT 37.9 04/08/2022   PLT 256 04/08/2022   MCV 91.3 04/08/2022   MCH 29.4 04/08/2022   MCHC 32.2 04/08/2022   RDW 13.8 04/08/2022   LYMPHSABS 1.2 03/26/2022   MONOABS 0.3 03/26/2022   EOSABS 0.0 03/26/2022    BMET Lab Results  Component Value Date   NA 138 04/08/2022   K 3.6 04/08/2022   CL 107 04/08/2022   CO2 24 04/08/2022   GLUCOSE 95 04/08/2022   BUN 7 04/08/2022   CREATININE 1.05 (H) 04/08/2022   CALCIUM 9.3 04/08/2022   GFRNONAA >60 04/08/2022   GFRAA 87 02/02/2021      Assessment and Plan Hiv disease = continue on descovy-prezcobix  Goiter/thyroid nodule = will check if US thyroid is scheduled  HTN = will start low dose HCTZ 12.'5mg'$    Hip pain = will check plain films ? Concern for avascular necrosis that can see with HIV disease  Health maitenance = received covid vaccine today

## 2022-06-28 ENCOUNTER — Emergency Department (HOSPITAL_COMMUNITY): Payer: Commercial Managed Care - HMO

## 2022-06-28 ENCOUNTER — Emergency Department (HOSPITAL_BASED_OUTPATIENT_CLINIC_OR_DEPARTMENT_OTHER): Payer: Commercial Managed Care - HMO

## 2022-06-28 ENCOUNTER — Emergency Department (HOSPITAL_COMMUNITY)
Admission: EM | Admit: 2022-06-28 | Discharge: 2022-06-29 | Disposition: A | Discharge status: Home / Self Care | Payer: Commercial Managed Care - HMO | Attending: Emergency Medicine | Admitting: Emergency Medicine

## 2022-06-28 ENCOUNTER — Encounter (HOSPITAL_COMMUNITY): Payer: Self-pay | Admitting: Emergency Medicine

## 2022-06-28 DIAGNOSIS — M79661 Pain in right lower leg: Secondary | ICD-10-CM | POA: Diagnosis not present

## 2022-06-28 DIAGNOSIS — R2241 Localized swelling, mass and lump, right lower limb: Secondary | ICD-10-CM | POA: Insufficient documentation

## 2022-06-28 DIAGNOSIS — D259 Leiomyoma of uterus, unspecified: Secondary | ICD-10-CM | POA: Diagnosis not present

## 2022-06-28 DIAGNOSIS — I10 Essential (primary) hypertension: Secondary | ICD-10-CM | POA: Diagnosis not present

## 2022-06-28 DIAGNOSIS — Z21 Asymptomatic human immunodeficiency virus [HIV] infection status: Secondary | ICD-10-CM | POA: Diagnosis not present

## 2022-06-28 DIAGNOSIS — M7989 Other specified soft tissue disorders: Secondary | ICD-10-CM | POA: Diagnosis not present

## 2022-06-28 DIAGNOSIS — B20 Human immunodeficiency virus [HIV] disease: Secondary | ICD-10-CM | POA: Diagnosis not present

## 2022-06-28 DIAGNOSIS — M79604 Pain in right leg: Secondary | ICD-10-CM | POA: Diagnosis not present

## 2022-06-28 DIAGNOSIS — M79609 Pain in unspecified limb: Secondary | ICD-10-CM | POA: Diagnosis not present

## 2022-06-28 DIAGNOSIS — Z79899 Other long term (current) drug therapy: Secondary | ICD-10-CM | POA: Insufficient documentation

## 2022-06-28 DIAGNOSIS — M79651 Pain in right thigh: Secondary | ICD-10-CM | POA: Diagnosis not present

## 2022-06-28 DIAGNOSIS — M25551 Pain in right hip: Secondary | ICD-10-CM | POA: Diagnosis not present

## 2022-06-28 DIAGNOSIS — R9431 Abnormal electrocardiogram [ECG] [EKG]: Secondary | ICD-10-CM | POA: Diagnosis not present

## 2022-06-28 LAB — BASIC METABOLIC PANEL
Anion gap: 8 (ref 5–15)
BUN: 8 mg/dL (ref 6–20)
CO2: 24 mmol/L (ref 22–32)
Calcium: 9 mg/dL (ref 8.9–10.3)
Chloride: 106 mmol/L (ref 98–111)
Creatinine, Ser: 0.98 mg/dL (ref 0.44–1.00)
GFR, Estimated: >60 mL/min (ref >60)
Glucose, Bld: 90 mg/dL (ref 70–99)
Potassium: 3.4 mmol/L — ABNORMAL LOW (ref 3.5–5.1)
Sodium: 138 mmol/L (ref 135–145)

## 2022-06-28 LAB — CBC WITH DIFFERENTIAL/PLATELET
Abs Immature Granulocytes: 0.01 10*3/uL (ref 0.00–0.07)
Basophils Absolute: 0 10*3/uL (ref 0.0–0.1)
Basophils Relative: 0 %
Eosinophils Absolute: 0 10*3/uL (ref 0.0–0.5)
Eosinophils Relative: 0 %
HCT: 31.7 % — ABNORMAL LOW (ref 36.0–46.0)
Hemoglobin: 10.3 g/dL — ABNORMAL LOW (ref 12.0–15.0)
Immature Granulocytes: 0 %
Lymphocytes Relative: 22 %
Lymphs Abs: 1 10*3/uL (ref 0.7–4.0)
MCH: 29.8 pg (ref 26.0–34.0)
MCHC: 32.5 g/dL (ref 30.0–36.0)
MCV: 91.6 fL (ref 80.0–100.0)
Monocytes Absolute: 0.4 10*3/uL (ref 0.1–1.0)
Monocytes Relative: 9 %
Neutro Abs: 3.2 10*3/uL (ref 1.7–7.7)
Neutrophils Relative %: 69 %
Platelets: 305 10*3/uL (ref 150–400)
RBC: 3.46 MIL/uL — ABNORMAL LOW (ref 3.87–5.11)
RDW: 13.7 % (ref 11.5–15.5)
WBC: 4.6 10*3/uL (ref 4.0–10.5)
nRBC: 0 % (ref 0.0–0.2)

## 2022-06-28 LAB — CK: Total CK: 138 U/L (ref 38–234)

## 2022-06-28 LAB — SEDIMENTATION RATE: Sed Rate: 48 mm/hr — ABNORMAL HIGH (ref 0–22)

## 2022-06-28 LAB — I-STAT BETA HCG BLOOD, ED (MC, WL, AP ONLY): I-stat hCG, quantitative: <5 m[IU]/mL (ref <5)

## 2022-06-28 LAB — C-REACTIVE PROTEIN: CRP: 2.2 mg/dL — ABNORMAL HIGH (ref <1.0)

## 2022-06-28 MED ORDER — PREDNISONE 20 MG PO TABS
60.0000 mg | ORAL_TABLET | Freq: Once | ORAL | Status: AC
Start: 2022-06-28 — End: 2022-06-28
  Administered 2022-06-28: 60 mg via ORAL
  Filled 2022-06-28: qty 3

## 2022-06-28 MED ORDER — IOHEXOL 350 MG/ML SOLN
60.0000 mL | Freq: Once | INTRAVENOUS | Status: AC | PRN
  Administered 2022-06-28: 60 mL via INTRAVENOUS

## 2022-06-28 MED ORDER — MORPHINE SULFATE (PF) 4 MG/ML IV SOLN
4.0000 mg | Freq: Once | INTRAVENOUS | Status: AC
  Administered 2022-06-28: 4 mg via INTRAVENOUS
  Filled 2022-06-28: qty 1

## 2022-06-28 MED ORDER — KETOROLAC TROMETHAMINE 15 MG/ML IJ SOLN
15.0000 mg | Freq: Once | INTRAMUSCULAR | Status: AC
  Administered 2022-06-28: 15 mg via INTRAMUSCULAR
  Filled 2022-06-28: qty 1

## 2022-06-28 NOTE — Progress Notes (Signed)
Lower extremity venous right study completed.  Preliminary results relayed to [place], [place].  See CV Proc for preliminary results report.   Darlin Coco, RDMS, RVT

## 2022-06-28 NOTE — ED Provider Triage Note (Signed)
Emergency Medicine Provider Triage Evaluation Note  Dawn [name] , a 51 y.o. female  was evaluated in triage.  Pt complains of right upper thigh pain.  Patient reports the pain has been ongoing for the last 2 days, has made ambulating difficult.  The patient reports that pain came on all of a sudden, denies any trauma or event to account for this pain.  Patient denies history of blood clots.  Patient denies overlying skin change.  Patient denies any increased activity.  Review of Systems  Positive:  Negative:   Physical Exam  BP 131/79 (BP Location: Right Arm)   Pulse 95   Temp 98.7 F (37.1 C) (Oral)   Resp 16   LMP 11/10/2021 (Approximate)   SpO2 99%  Gen:   Awake, no distress   Resp:  Normal effort  MSK:   Moves extremities without difficulty  Other:    Medical Decision Making  Medically screening exam initiated at 2:58 PM.  Appropriate orders placed.  Dawn [name] was informed that the remainder of the evaluation will be completed by another provider, this initial triage assessment does not replace that evaluation, and the importance of remaining in the ED until their evaluation is complete.     Azucena Cecil, PA-C 06/28/22 1459

## 2022-06-28 NOTE — ED Provider Notes (Signed)
[place] EMERGENCY DEPARTMENT Provider Note   CSN: [id number] Arrival date & time: 06/28/22  1446     History  Chief Complaint  Patient presents with   Leg Pain    Dawn [name] is a 51 y.o. female.  The history is provided by the patient and medical records. No language interpreter was used.  Leg Pain    Dawn [name] is a 50 yo female with a medical history of HIV and hypertension presents with right sided leg pain. Onset of pain was 2 days ago. Pt states the pain is in her hip and will shoot down her thigh. Pt reports associated numbness/tinglings that will sometimes go to her toes and worsens with weightbearing. Pt states it feels like she is "dragging her leg behind her". Pt denies fever or chills, chest pain, SOB, loss of sensation, dysuria, hematuria, bowel or bladder incontinence or saddle anesthesia.   Home Medications Prior to Admission medications   Medication Sig Start Date End Date Taking? Authorizing Provider  albuterol (VENTOLIN HFA) 108 (90 Base) MCG/ACT inhaler Inhale 1 puff into the lungs every 6 (six) hours as needed for wheezing or shortness of breath. 06/14/21   Chase Picket, MD  DESCOVY 200-25 MG tablet TAKE ONE TABLET BY MOUTH DAILY 05/30/22   Carlyle Basques, MD  dicyclomine (BENTYL) 20 MG tablet Take 1 tablet (20 mg total) by mouth 2 (two) times daily for 5 days. 04/09/22 04/14/22  Jeanell Sparrow, DO  doxycycline (VIBRA-TABS) 100 MG tablet Take 1 tablet (100 mg total) by mouth 2 (two) times daily. 05/22/22   Carlyle Basques, MD  ferrous sulfate 325 (65 FE) MG tablet Take 1 tablet (325 mg total) by mouth daily. 09/04/20   Petrucelli, Samantha R, PA-C  FLUoxetine (PROZAC) 20 MG capsule Take 1 capsule (20 mg total) by mouth daily. 04/17/22   Carlyle Basques, MD  hydrochlorothiazide (HYDRODIURIL) 25 MG tablet Take 0.5 tablets (12.5 mg total) by mouth daily. 06/22/22   Carlyle Basques, MD  hydrOXYzine (ATARAX) 25 MG tablet Take 1 tablet (25 mg  total) by mouth every 8 (eight) hours as needed. 04/09/22   Jeanell Sparrow, DO  megestrol (MEGACE) 40 MG tablet Take 1 tablet (40 mg total) by mouth 2 (two) times daily. Can increase to two tablets twice a day in the event of heavy bleeding 03/17/21   Constant, Peggy, MD  ondansetron (ZOFRAN ODT) 4 MG disintegrating tablet Take 1 tablet (4 mg total) by mouth every 8 (eight) hours as needed for nausea or vomiting. 07/06/21   Raspet, Derry Skill, PA-C  potassium chloride SA (KLOR-CON) 20 MEQ tablet Take 1 tablet (20 mEq total) by mouth daily. 09/04/20   Petrucelli, Samantha R, PA-C  PREZCOBIX 800-150 MG tablet TAKE ONE TABLET BY MOUTH DAILY WITH BREAKFAST *SWALLOW WHOLE. DO NOT CRUSH, BREAK OR CHEW TABLETS* 05/30/22   Carlyle Basques, MD  promethazine-dextromethorphan (PROMETHAZINE-DM) 6.25-15 MG/5ML syrup Take 5 mLs by mouth 4 (four) times daily as needed for cough. 08/29/20   Caccavale, Sophia, PA-C  sucralfate (CARAFATE) 1 g tablet Take 1 tablet (1 g total) by mouth 4 (four) times daily -  with meals and at bedtime for 7 days. 04/09/22 04/16/22  Jeanell Sparrow, DO  valACYclovir (VALTREX) 1000 MG tablet TAKE ONE TABLET BY MOUTH DAILY 02/08/22   Carlyle Basques, MD  diphenhydrAMINE (BENADRYL) 25 MG tablet Take 1 tablet (25 mg total) by mouth every 6 (six) hours as needed for up to 3 days. 06/17/20 09/04/20  Vallarie Mare M, PA-C  famotidine (PEPCID) 20 MG tablet Take 1 tablet (20 mg total) by mouth 2 (two) times daily for 5 days. 06/17/20 09/04/20  Lannie Fields, PA-C      Allergies    Chocolate and Tomato    Review of Systems   Review of Systems  All other systems reviewed and are negative.   Physical Exam Updated Vital Signs BP 131/79 (BP Location: Right Arm)   Pulse 95   Temp 98.7 F (37.1 C) (Oral)   Resp 16   LMP 11/10/2021 (Approximate)   SpO2 99%  Physical Exam Vitals and nursing note reviewed.  Constitutional:      General: She is not in acute distress.    Appearance: She is well-developed.   HENT:     Head: Atraumatic.  Eyes:     Conjunctiva/sclera: Conjunctivae normal.  Pulmonary:     Effort: Pulmonary effort is normal.  Abdominal:     Palpations: Abdomen is soft.     Tenderness: There is no abdominal tenderness.  Musculoskeletal:        General: Tenderness (Right hip is seen in a flexed position, right knees in flexed position.  Tenderness to hip and knees on palpation with increasing pain with range of motion.  Mild tenderness to lumbar spine and right lumbosacral region.) present.     Cervical back: Neck supple.  Skin:    Findings: No rash.  Neurological:     Mental Status: She is alert.  Psychiatric:        Mood and Affect: Mood normal.     ED Results / Procedures / Treatments   Labs (all labs ordered are listed, but only abnormal results are displayed) Labs Reviewed  CBC WITH DIFFERENTIAL/PLATELET - Abnormal; Notable for the following components:      Result Value   RBC 3.46 (*)    Hemoglobin 10.3 (*)    HCT 31.7 (*)    All other components within normal limits  BASIC METABOLIC PANEL - Abnormal; Notable for the following components:   Potassium 3.4 (*)    All other components within normal limits  SEDIMENTATION RATE - Abnormal; Notable for the following components:   Sed Rate 48 (*)    All other components within normal limits  C-REACTIVE PROTEIN - Abnormal; Notable for the following components:   CRP 2.2 (*)    All other components within normal limits  CK  I-STAT BETA HCG BLOOD, ED ([facility], [facility], [facility] ONLY)    EKG None  Radiology CT HIP RIGHT W CONTRAST  Result Date: 06/28/2022 CLINICAL DATA:  Hip pain, stress fracture suspected, neg xray hx of HIV, atraumatic R hip/thigh pain with swelling. possible myositis EXAM: CT OF THE LOWER RIGHT EXTREMITY WITH CONTRAST TECHNIQUE: Multidetector CT imaging of the lower right extremity was performed according to the standard protocol following intravenous contrast administration. RADIATION DOSE REDUCTION: This  exam was performed according to the departmental dose-optimization program which includes automated exposure control, adjustment of the mA and/or kV according to patient size and/or use of iterative reconstruction technique. CONTRAST:  57m OMNIPAQUE IOHEXOL 350 MG/ML SOLN COMPARISON:  None Available. FINDINGS: Bones/Joint/Cartilage Normal alignment. No acute fracture or dislocation. Right hip joint space has been preserved. There is focal periosteal reaction involving the posterolateral right proximal femoral diaphysis, best seen on axial image # 55/3. No associated cortical erosion. No lytic or blastic bone lesion. Ligaments Suboptimally assessed by CT. Muscles and Tendons Gluteal, hamstring, and iliopsoas tendons are intact. Normal  muscle bulk. Soft tissues There is an infiltrative soft tissue mass demonstrating punctate calcification measuring roughly 11 x 16 x 18 mm on axial image # 57/5 and coronal image # 60/8 adjacent to the area of periosteal reaction which is nonspecific. This may represent a primary periosteal mass, a primary soft tissue sarcoma with adjacent superficial periosteal reaction, or a posttraumatic conditions such as myositis ossific cans. There is infiltrative change within the surrounding soft tissues which may reflect surrounding edema. The mass appears to abut the adjacent gluteus maximus and vastus lateralis musculature. Fibroid uterus noted. Small free fluid within the pelvis may be physiologic in female patient of this age. IMPRESSION: 1. Infiltrative soft tissue mass demonstrating punctate calcification adjacent to the area of periosteal reaction involving the posterolateral right proximal femoral diaphysis. This may represent a primary periosteal mass, a primary soft tissue sarcoma with adjacent superficial periosteal reaction, or a posttraumatic conditions such as myositis ossific cans. There is infiltrative change within the surrounding soft tissues which may reflect surrounding  edema. MRI examination with contrast may be helpful for further evaluation. 2. No acute fracture or dislocation Electronically Signed   By: Fidela Salisbury M.D.   On: 06/28/2022 22:59   DG Femur Min 2 Views Right  Result Date: 06/28/2022 CLINICAL DATA:  Pain EXAM: RIGHT FEMUR 2 VIEWS COMPARISON:  None Available. FINDINGS: There is no evidence of fracture or other focal bone lesions. There some increased soft tissue density adjacent to the proximal lateral femoral diaphysis which may be related to normal musculature. There is no underlying osseous abnormality. IMPRESSION: Increased soft tissue density adjacent to the proximal lateral femoral diaphysis which may be related to normal musculature. Please correlate for point tenderness to exclude other etiologies. No underlying osseous abnormality. Electronically Signed   By: Ronney Asters M.D.   On: 06/28/2022 17:50   VAS [place] LOWER EXTREMITY VENOUS (DVT) (7a-7p)  Result Date: 06/28/2022  Lower Venous DVT Study Patient Name:  ALEEA HENDRY  Date of Exam:   06/28/2022 Medical Rec #: [id number]       Accession #:    [id number] Date of Birth: October 26, 1970       Patient Gender: F Patient Age:   89 years Exam Location:  [facility] Procedure:      VAS [place] LOWER EXTREMITY VENOUS (DVT) Referring Phys: Genevive Bi --------------------------------------------------------------------------------  Indications: Shooting pain to right thigh, patient endorses back pain and difficulty with ambulation due to pain x2 days.  Comparison Study: No prior studies. Performing Technologist: Darlin Coco RDMS, RVT  Examination Guidelines: A complete evaluation includes B-mode imaging, spectral Doppler, color Doppler, and power Doppler as needed of all accessible portions of each vessel. Bilateral testing is considered an integral part of a complete examination. Limited examinations for reoccurring indications may be performed as noted. The reflux portion of the exam is performed  with the patient in reverse Trendelenburg.  +---------+---------------+---------+-----------+----------+--------------+ RIGHT    CompressibilityPhasicitySpontaneityPropertiesThrombus Aging +---------+---------------+---------+-----------+----------+--------------+ CFV      Full           Yes      Yes                                 +---------+---------------+---------+-----------+----------+--------------+ SFJ      Full                                                        +---------+---------------+---------+-----------+----------+--------------+  FV Prox  Full                                                        +---------+---------------+---------+-----------+----------+--------------+ FV Mid   Full                                                        +---------+---------------+---------+-----------+----------+--------------+ FV DistalFull                                                        +---------+---------------+---------+-----------+----------+--------------+ PFV      Full                                                        +---------+---------------+---------+-----------+----------+--------------+ POP      Full           Yes      Yes                                 +---------+---------------+---------+-----------+----------+--------------+ PTV      Full                                                        +---------+---------------+---------+-----------+----------+--------------+ PERO     Full                                                        +---------+---------------+---------+-----------+----------+--------------+ Gastroc  Full                                                        +---------+---------------+---------+-----------+----------+--------------+   +----+---------------+---------+-----------+----------+--------------+ LEFTCompressibilityPhasicitySpontaneityPropertiesThrombus Aging  +----+---------------+---------+-----------+----------+--------------+ CFV Full           Yes      Yes                                 +----+---------------+---------+-----------+----------+--------------+    Summary: RIGHT: - There is no evidence of deep vein thrombosis in the lower extremity.  - No cystic structure found in the popliteal fossa.  LEFT: - No evidence of common femoral vein obstruction.  *See table(s) above for measurements and observations. Electronically signed by Monica Martinez MD on 06/28/2022 at 4:25:44 PM.  Final     Procedures Procedures    Medications Ordered in ED Medications  ketorolac (TORADOL) 15 MG/ML injection 15 mg (15 mg Intramuscular Given 06/28/22 1916)  predniSONE (DELTASONE) tablet 60 mg (60 mg Oral Given 06/28/22 1917)  morphine (PF) 4 MG/ML injection 4 mg (4 mg Intravenous Given 06/28/22 2026)  iohexol (OMNIPAQUE) 350 MG/ML injection 60 mL (60 mLs Intravenous Contrast Given 06/28/22 2212)    ED Course/ Medical Decision Making/ A&P                           Medical Decision Making Amount and/or Complexity of Data Reviewed Labs: ordered. Radiology: ordered.  Risk Prescription drug management.   BP 131/79 (BP Location: Right Arm)   Pulse 95   Temp 98.7 F (37.1 C) (Oral)   Resp 16   LMP 11/10/2021 (Approximate)   SpO2 99%   23:17 PM 51 year old female seen in history of HIV currently on antiviral medication, history of hypertension, uterine fibroid, anxiety, brought here via EMS from home with complaints of right leg pain.  Patient report for the past 2 days she has had sharp shooting pain to her right lower back/hip which shoots down to her thigh.  Pain is moderate in severity with tightness sensation about the leg.  Pain worsening with ambulation.  No fever or chills no abdominal pain no dysuria or hematuria no bowel bladder incontinence or saddle anesthesia.  No history of IV drug use.  She has been taking Tylenol and Motrin at home  without adequate relief.  She states she has a directed leg when walking but attributed to pain and not to numbness or weakness.  On exam, patient is holding her right leg flexed at the hip and flex at the knee.  She has difficulty straighten her leg as a cause pain.  She has intact passive range of motion with discomfort.  Right positive straight leg raise.  She has intact pedal pulses.  Her leg compartment is soft.  No overlying skin changes.  Her leg is not shortened or externally rotated.  She has mild lumbar spine tenderness without crepitus or step-off.  Her vital signs is normal, she is afebrile and no hypoxia.  7:16 PM X-ray of the right femur demonstrate increased soft tissue density to the adjacent proximal lateral femoral diaphysis.  Patient is tender to this affected area without any erythema or warmth appreciated.  No fluctuant.  Given her history of HIV, and having this point tenderness area, will obtain CT scan of the right hip with contrast for further assessment.  Possible myositis considered.  Septic joint is less likely.  HPI -Labs ordered, independently viewed and interpreted by me.  Labs remarkable for normal CK, doubt rhabdo, normal electrolytes panel, preg test neg. -EKG interpreted by me showing NSR -imaging independently viewed and interpreted by me and I agree with radiologist's interpretation.  Result remarkable for increased soft tissue density adjacent to the pro -DDx: contusion, myositis, sciatica, abscess, cellulitis, fx -treatment includes prednisone, morphine, ketorolac -PCP office notes or outside notes reviewed -Discussion with orthopedist Dr. Percell Miller who recommend MRI w/wout CM on R hip to help differentiate between abscess vs malignancy.  If there's a collection of fluid, then IR should be consult for drainage.  Pt made aware of plan.  MRI ordered -Escalation to admission/observation considered, pt sign out to oncoming team -Social Determinant of Health considered  which includes food insecurity, and lack of transportation.  11:29 PM Pt sign out to oncoming team who will f/u on MRI result and determine disposition.          Final Clinical Impression(s) / ED Diagnoses Final diagnoses:  None    Rx / DC Orders ED Discharge Orders     None         Domenic Moras, PA-C 06/28/22 2332    Ezequiel Essex, MD 06/28/22 2342

## 2022-06-28 NOTE — ED Notes (Signed)
Pt returned from scans.  

## 2022-06-28 NOTE — ED Triage Notes (Signed)
Patient BIB [facility] from home for evaluation of aching right thigh pain that started two days ago. Patient states pain makes ambulation difficult, pain has not been improved by tylenol and motrin. Patient denies recent injury, is alert, oriented, and in no apparent distress at this time.

## 2022-06-28 NOTE — ED Notes (Signed)
Pt transported to CT ?

## 2022-06-28 NOTE — ED Notes (Signed)
Pt returned from CT °

## 2022-06-29 ENCOUNTER — Other Ambulatory Visit (HOSPITAL_COMMUNITY): Payer: Commercial Managed Care - HMO

## 2022-06-29 ENCOUNTER — Emergency Department (HOSPITAL_COMMUNITY): Payer: Commercial Managed Care - HMO

## 2022-06-29 DIAGNOSIS — R2241 Localized swelling, mass and lump, right lower limb: Secondary | ICD-10-CM | POA: Diagnosis not present

## 2022-06-29 DIAGNOSIS — D259 Leiomyoma of uterus, unspecified: Secondary | ICD-10-CM | POA: Diagnosis not present

## 2022-06-29 DIAGNOSIS — M79604 Pain in right leg: Secondary | ICD-10-CM | POA: Diagnosis not present

## 2022-06-29 MED ORDER — LORAZEPAM 2 MG/ML IJ SOLN
1.0000 mg | Freq: Once | INTRAMUSCULAR | Status: AC | PRN
  Administered 2022-06-29: 1 mg via INTRAVENOUS
  Filled 2022-06-29: qty 1

## 2022-06-29 MED ORDER — GADOBUTROL 1 MMOL/ML IV SOLN
7.0000 mL | Freq: Once | INTRAVENOUS | Status: AC | PRN
  Administered 2022-06-29: 7 mL via INTRAVENOUS

## 2022-06-29 NOTE — Discharge Instructions (Signed)
Please take Tylenol or Motrin for pain.  Use the brace and crutches when walking.  Please follow-up with the orthopedic doctor listed.  You will need to call their office.

## 2022-06-29 NOTE — ED Notes (Signed)
Ortho tech called 

## 2022-06-29 NOTE — ED Notes (Signed)
Pt transported to MRI 

## 2022-06-29 NOTE — ED Notes (Signed)
Ortho tech at bedside 

## 2022-06-29 NOTE — ED Provider Notes (Signed)
MR prelim read shows obturator externus muscle tear or strain with surrounding edema.  Inbox message sent to Dr. Percell Miller, (previous team consulted earlier tonight).    Patient given immobilizer and crutches.     Montine Circle, PA-C 06/29/22 0426    Palumbo, April, MD 06/29/22 [phone]

## 2022-06-29 NOTE — ED Notes (Signed)
RN assumed care, report that pt is at MRI

## 2022-06-29 NOTE — ED Notes (Signed)
Patient verbalizes understanding of discharge instructions. Opportunity for questioning and answers were provided. Armband removed by staff, pt discharged from ED via wheelchair.  

## 2022-06-29 NOTE — Progress Notes (Signed)
Orthopedic Tech Progress Note Patient Details:  Dawn [name] 1970-09-13 [id number]  Ortho Devices Type of Ortho Device: Knee Immobilizer, Crutches Ortho Device/Splint Location: RLE Ortho Device/Splint Interventions: Ordered, Application, Adjustment   Post Interventions Patient Tolerated: Well Instructions Provided: Adjustment of device, Care of device, Poper ambulation with device  [name] 06/29/2022, 5:19 AM

## 2022-07-04 ENCOUNTER — Ambulatory Visit: Payer: Commercial Managed Care - HMO | Admitting: Emergency Medicine

## 2022-07-10 ENCOUNTER — Other Ambulatory Visit: Payer: Self-pay | Admitting: Internal Medicine

## 2022-07-10 DIAGNOSIS — B009 Herpesviral infection, unspecified: Secondary | ICD-10-CM

## 2022-07-11 NOTE — Congregational Nurse Program (Signed)
  Dept: [phone]   Congregational Nurse Program Note  Date of Encounter: 07/11/2022  Clinic visit for blood pressure check and follow-up on scheduled new PCP appointment.  BP 123/67, pulse 84 and regular, O2 Sat 99%.    Had ER visit on 06/28/22 for pain Right leg with numbness of toes, denies any pain today nor any numbness or tingling in right foot.  Has an appointment with an orthopedist on 08/04/22. Past Medical History: Past Medical History:  Diagnosis Date   Anemia    Anxiety    Fibroid    Headache(784.0)    HIV (human immunodeficiency virus infection) ([place])    Infection    UTI    Encounter Details:  CNP Questionnaire - 07/11/22 1357       Questionnaire   Ask client: Do you give verbal consent for me to treat you today? Yes    Student Assistance N/A    Location Patient [place]    Visit Setting with Client Organization    Patient Status Unknown   Has apartment at [facility]    Insurance/Financial Assistance Referral N/A    Medication N/A    Medical Provider No    Screening Referrals Made N/A    Medical Referrals Made N/A    Medical Appointment Made N/A    Recently w/o PCP, now 1st time PCP visit completed due to CNs referral or appointment made N/A    Food Have Food Insecurities    Transportation Need transportation assistance    Housing/Utilities N/A    Interpersonal Safety N/A    Interventions Spiritual Care;Counsel;Advocate/Support;Navigate Healthcare System    Abnormal to Normal Screening Since Last CN Visit N/A    Screenings CN Performed Blood Pressure;Pulse Ox    Sent Client to Lab for: N/A    Did client attend any of the following based off CNs referral or appointments made? N/A    ED Visit Averted N/A    Life-Saving Intervention Made N/A

## 2022-08-08 ENCOUNTER — Ambulatory Visit (HOSPITAL_COMMUNITY)
Admission: RE | Admit: 2022-08-08 | Discharge: 2022-08-08 | Disposition: A | Discharge status: Home / Self Care | Payer: Medicaid Other | Source: Ambulatory Visit | Attending: Internal Medicine | Admitting: Internal Medicine

## 2022-08-08 DIAGNOSIS — E041 Nontoxic single thyroid nodule: Secondary | ICD-10-CM | POA: Diagnosis not present

## 2022-08-08 DIAGNOSIS — E010 Iodine-deficiency related diffuse (endemic) goiter: Secondary | ICD-10-CM | POA: Diagnosis not present

## 2022-08-08 DIAGNOSIS — Z7689 Persons encountering health services in other specified circumstances: Secondary | ICD-10-CM | POA: Diagnosis not present

## 2022-08-10 ENCOUNTER — Ambulatory Visit: Payer: Commercial Managed Care - HMO | Admitting: Family Medicine

## 2022-08-14 ENCOUNTER — Telehealth: Payer: Self-pay

## 2022-08-14 ENCOUNTER — Ambulatory Visit (INDEPENDENT_AMBULATORY_CARE_PROVIDER_SITE_OTHER): Payer: Medicaid Other | Admitting: Infectious Disease

## 2022-08-14 ENCOUNTER — Other Ambulatory Visit: Payer: Self-pay

## 2022-08-14 ENCOUNTER — Other Ambulatory Visit (HOSPITAL_COMMUNITY)
Admission: RE | Admit: 2022-08-14 | Discharge: 2022-08-14 | Disposition: A | Discharge status: Home / Self Care | Payer: Medicaid Other | Source: Ambulatory Visit | Attending: Infectious Disease | Admitting: Infectious Disease

## 2022-08-14 ENCOUNTER — Encounter: Payer: Self-pay | Admitting: Infectious Disease

## 2022-08-14 VITALS — Wt 152.0 lb

## 2022-08-14 DIAGNOSIS — B20 Human immunodeficiency virus [HIV] disease: Secondary | ICD-10-CM | POA: Diagnosis not present

## 2022-08-14 DIAGNOSIS — N898 Other specified noninflammatory disorders of vagina: Secondary | ICD-10-CM | POA: Diagnosis not present

## 2022-08-14 DIAGNOSIS — M6520 Calcific tendinitis, unspecified site: Secondary | ICD-10-CM | POA: Diagnosis not present

## 2022-08-14 DIAGNOSIS — Z113 Encounter for screening for infections with a predominantly sexual mode of transmission: Secondary | ICD-10-CM | POA: Insufficient documentation

## 2022-08-14 HISTORY — DX: Other specified noninflammatory disorders of vagina: N89.8

## 2022-08-14 HISTORY — DX: Calcific tendinitis, unspecified site: M65.20

## 2022-08-14 MED ORDER — DOXYCYCLINE HYCLATE 100 MG PO TABS: ORAL_TABLET | ORAL | 2 refills | Status: DC

## 2022-08-14 MED ORDER — METRONIDAZOLE 500 MG PO TABS: 500.0000 mg | ORAL_TABLET | Freq: Two times a day (BID) | ORAL | 0 refills | Status: DC

## 2022-08-14 NOTE — Telephone Encounter (Signed)
Patient complains of vaginal itching, watery yellow discharge after unprotected sexual encounter about 1 week ago. Patient denies any pain while urinating. Patient requesting STI testing and urinalysis. Patient scheduled with Dr. Tommy Medal today and will follow up with Dr. Schrack Flattery at next scheduled visit.   Eugenia Mcalpine, LPN

## 2022-08-14 NOTE — Progress Notes (Addendum)
Subjective:  Chief complaint vaginal discharge and continued right-sided hip pain  Patient ID: Dawn [name], female    DOB: Apr 03, 1971, 51 y.o.   MRN: [id number]  HPI  Dawn [name] is a very 76 black woman ligament HIV with fairly resistant virus who is on a regimen of press but Cobaxine TIVICAY with a recent undetectable viral load and healthy CD4 count.  She presents to clinic today to clinic today due to concerns about vaginal discharge that occurred about a week after unprotected sex with another HIV-positive person who reportedly is taking his medications.  She did endorse having oral sex and also penile vaginal sex.  There were initially using condoms but then were not using condoms after he convince her that they would not build to give HIV to 1 another.  She also thought that there were an exclusive relationship but it turns out he has apparently been seeing prostitutes as well.  Very worried about her hip where she has had pain.  Workup so far included CT scan which would raise the suspicion of a sarcoma.  MRI of the hip however shows an area of either tendinitis or hydroxyapatite acute presentation she is made an appointment with Dawn [name] for evaluation of this.    Past Medical History:  Diagnosis Date   Anemia    Anxiety    Fibroid    Headache(784.0)    HIV (human immunodeficiency virus infection) ([place])    Infection    UTI    Past Surgical History:  Procedure Laterality Date   CESAREAN SECTION     DILATION AND CURETTAGE OF UTERUS     HAND TENDON SURGERY     right thumb   INDUCED ABORTION     TUBAL LIGATION      Family History  Problem Relation Age of Onset   Breast cancer Neg Hx       Social History   Socioeconomic History   Marital status: Single    Spouse name: Not on file   Number of children: Not on file   Years of education: Not on file   Highest education level: Not on file  Occupational History   Not on file  Tobacco Use   Smoking  status: Former    Types: Cigarettes   Smokeless tobacco: Never  Vaping Use   Vaping Use: Never used  Substance and Sexual Activity   Alcohol use: Yes    Comment: hx of alcohol abuse- social drinker now   Drug use: Not Currently    Types: Marijuana    Comment: none in 8 yrs   Sexual activity: Yes    Birth control/protection: Surgical, Condom    Comment: accepted condoms  Other Topics Concern   Not on file  Social History Narrative   Not on file   Social Determinants of Health   Financial Resource Strain: Not on file  Food Insecurity: Food Insecurity Present (11/21/2021)   Hunger Vital Sign    Worried About Running Out of Food in the Last Year: Sometimes true    Ran Out of Food in the Last Year: Often true  Transportation Needs: Unmet Transportation Needs (11/21/2021)   PRAPARE - Hydrologist (Medical): Yes    Lack of Transportation (Non-Medical): Yes  Physical Activity: Not on file  Stress: Not on file  Social Connections: Not on file    Allergies  Allergen Reactions   Chocolate Hives   Tomato Hives     Current Outpatient  Medications:    albuterol (VENTOLIN HFA) 108 (90 Base) MCG/ACT inhaler, Inhale 1 puff into the lungs every 6 (six) hours as needed for wheezing or shortness of breath., Disp: 18 g, Rfl: 0   DESCOVY 200-25 MG tablet, TAKE ONE TABLET BY MOUTH DAILY, Disp: 30 tablet, Rfl: 4   dicyclomine (BENTYL) 20 MG tablet, Take 1 tablet (20 mg total) by mouth 2 (two) times daily for 5 days., Disp: 10 tablet, Rfl: 0   doxycycline (VIBRA-TABS) 100 MG tablet, Take 1 tablet (100 mg total) by mouth 2 (two) times daily., Disp: 20 tablet, Rfl: 0   ferrous sulfate 325 (65 FE) MG tablet, Take 1 tablet (325 mg total) by mouth daily., Disp: 30 tablet, Rfl: 0   FLUoxetine (PROZAC) 20 MG capsule, Take 1 capsule (20 mg total) by mouth daily., Disp: 30 capsule, Rfl: 11   hydrochlorothiazide (HYDRODIURIL) 25 MG tablet, Take 0.5 tablets (12.5 mg total) by  mouth daily., Disp: 30 tablet, Rfl: 11   hydrOXYzine (ATARAX) 25 MG tablet, Take 1 tablet (25 mg total) by mouth every 8 (eight) hours as needed., Disp: 12 tablet, Rfl: 0   megestrol (MEGACE) 40 MG tablet, Take 1 tablet (40 mg total) by mouth 2 (two) times daily. Can increase to two tablets twice a day in the event of heavy bleeding, Disp: 60 tablet, Rfl: 5   ondansetron (ZOFRAN ODT) 4 MG disintegrating tablet, Take 1 tablet (4 mg total) by mouth every 8 (eight) hours as needed for nausea or vomiting., Disp: 20 tablet, Rfl: 0   potassium chloride SA (KLOR-CON) 20 MEQ tablet, Take 1 tablet (20 mEq total) by mouth daily., Disp: 5 tablet, Rfl: 0   PREZCOBIX 800-150 MG tablet, TAKE ONE TABLET BY MOUTH DAILY WITH BREAKFAST *SWALLOW WHOLE. DO NOT CRUSH, BREAK OR CHEW TABLETS*, Disp: 30 tablet, Rfl: 4   promethazine-dextromethorphan (PROMETHAZINE-DM) 6.25-15 MG/5ML syrup, Take 5 mLs by mouth 4 (four) times daily as needed for cough., Disp: 118 mL, Rfl: 0   sucralfate (CARAFATE) 1 g tablet, Take 1 tablet (1 g total) by mouth 4 (four) times daily -  with meals and at bedtime for 7 days., Disp: 28 tablet, Rfl: 0   valACYclovir (VALTREX) 1000 MG tablet, TAKE ONE TABLET BY MOUTH DAILY, Disp: 30 tablet, Rfl: 3   Review of Systems  Constitutional:  Negative for activity change, appetite change, chills, diaphoresis, fatigue, fever and unexpected weight change.  HENT:  Negative for congestion, rhinorrhea, sinus pressure, sneezing, sore throat and trouble swallowing.   Eyes:  Negative for photophobia and visual disturbance.  Respiratory:  Negative for cough, chest tightness, shortness of breath, wheezing and stridor.   Cardiovascular:  Negative for chest pain, palpitations and leg swelling.  Gastrointestinal:  Negative for abdominal distention, abdominal pain, anal bleeding, blood in stool, constipation, diarrhea, nausea and vomiting.  Genitourinary:  Positive for vaginal discharge. Negative for difficulty  urinating, dysuria, flank pain and hematuria.  Musculoskeletal:  Positive for arthralgias. Negative for back pain, gait problem, joint swelling and myalgias.  Skin:  Negative for color change, pallor, rash and wound.  Neurological:  Negative for dizziness, tremors, weakness and light-headedness.  Hematological:  Negative for adenopathy. Does not bruise/bleed easily.  Psychiatric/Behavioral:  Negative for agitation, behavioral problems, confusion, decreased concentration, dysphoric mood and sleep disturbance.        Objective:   Physical Exam Constitutional:      General: She is not in acute distress.    Appearance: Normal appearance. She is well-developed. She  is not ill-appearing or diaphoretic.  HENT:     Head: Normocephalic and atraumatic.     Right Ear: Hearing and external ear normal.     Left Ear: Hearing and external ear normal.     Nose: No nasal deformity or rhinorrhea.  Eyes:     General: No scleral icterus.    Extraocular Movements: Extraocular movements intact.     Conjunctiva/sclera: Conjunctivae normal.     Right eye: Right conjunctiva is not injected.     Left eye: Left conjunctiva is not injected.     Pupils: Pupils are equal, round, and reactive to light.  Neck:     Vascular: No JVD.  Cardiovascular:     Rate and Rhythm: Normal rate and regular rhythm.     Heart sounds: S1 normal and S2 normal.  Abdominal:     General: There is no distension.     Palpations: Abdomen is soft.  Musculoskeletal:        General: Normal range of motion.     Right shoulder: Normal.     Left shoulder: Normal.     Cervical back: Normal range of motion and neck supple.     Right hip: Normal.     Left hip: Normal.     Right knee: Normal.     Left knee: Normal.  Lymphadenopathy:     Head:     Right side of head: No submandibular, preauricular or posterior auricular adenopathy.     Left side of head: No submandibular, preauricular or posterior auricular adenopathy.     Cervical: No  cervical adenopathy.     Right cervical: No superficial or deep cervical adenopathy.    Left cervical: No superficial or deep cervical adenopathy.  Skin:    General: Skin is warm and dry.     Coloration: Skin is not pale.     Findings: No abrasion, bruising, ecchymosis, erythema, lesion or rash.     Nails: There is no clubbing.  Neurological:     Mental Status: She is alert and oriented to person, place, and time.     Sensory: No sensory deficit.     Coordination: Coordination normal.     Gait: Gait normal.  Psychiatric:        Attention and Perception: Attention normal. She is attentive.        Mood and Affect: Mood is anxious.        Speech: Speech normal.        Behavior: Behavior normal. Behavior is cooperative.        Thought Content: Thought content normal.        Judgment: Judgment normal.           Assessment & Plan:   Vaginal discharge: watery. Sounds like either Monus or bacterial vaginosis we will screen urine for gonorrhea chlamydia and trichomonas and I have sent a prescription for Flagyl 500 mg twice daily for 7 days.  We also will screen her for gonorrhea chlamydia oropharynx check a syphilis lab and check her HIV RNA.  I talked her about doxycycline postexposure prophylaxis and have sent in doxycycline that she can take 2 tablets after unprotected sex.  HIV disease we will recheck her viral load today.    Right hip pain with possible tendinitis versus hydroxyapatite acutely being deposited she is going to see Dawn [name] for this.   ADDENDUM: I did not originally include the following details in my note, in part because how to handle this information  required further thought. I wish with retrospect I had asked further questions re this matter with Dawn [name]  The person with whom she was having unprotected sex described above is someone that Dawn [name] whom Dawn [name] said told her that he worked for [employer]. He had told her that because they both  already had HIV (I dont know if he mentioned U=U). She then told me "he should have known better he works for [employer]."  I have learned that Dawn [name] is a client with [employer]. I do NOT know if she has been a client of this particular employee at [facility]. That being said IF what she said is true that an employee with [employer] an organization with whom she is a client had sex with her, I feel that this raises significant ethical questions, esp if she was his client. She showed me his contact number with a first name only--"he wont tell me his last name." I believe I can recall the first name but I would like to get corroboration from Dawn [name].  We will endeavor to get more information from Dawn [name]. I think we will need to involve risk management for advice, potentially legal. We need to understand if we need to or are obliged to notify [employer].

## 2022-08-15 ENCOUNTER — Ambulatory Visit: Payer: Commercial Managed Care - HMO | Admitting: Internal Medicine

## 2022-08-15 ENCOUNTER — Telehealth: Payer: Self-pay

## 2022-08-15 LAB — URINE CYTOLOGY ANCILLARY ONLY
Chlamydia: NEGATIVE
Comment: NEGATIVE
Comment: NEGATIVE
Comment: NORMAL
Neisseria Gonorrhea: NEGATIVE
Trichomonas: POSITIVE — AB

## 2022-08-15 LAB — CYTOLOGY, (ORAL, ANAL, URETHRAL) ANCILLARY ONLY
Chlamydia: NEGATIVE
Comment: NEGATIVE
Comment: NORMAL
Neisseria Gonorrhea: NEGATIVE

## 2022-08-15 NOTE — Telephone Encounter (Signed)
I attempted to contact the patient to discuss lab results. Call could not be completed.  \Hyder Deman T Brooks Sailors

## 2022-08-15 NOTE — Telephone Encounter (Signed)
-----   Message from Truman Hayward, MD sent at 08/15/2022  4:04 PM EST ----- Pt with trichomonas. I started her empirically on flagyl which will cover this ----- Message ----- From: Interface, [facility] Sent: 08/15/2022   1:54 PM EST To: Truman Hayward, MD

## 2022-08-16 LAB — HIV-1 RNA QUANT-NO REFLEX-BLD
HIV 1 RNA Quant: NOT DETECTED Copies/mL
HIV-1 RNA Quant, Log: NOT DETECTED Log cps/mL

## 2022-08-16 LAB — RPR: RPR Ser Ql: NONREACTIVE

## 2022-08-16 NOTE — Telephone Encounter (Signed)
Patient returned call, relayed positive trichomonas results. Patient confirms she is taking metronidazole. Advised this is a 7 day course.   She would like to know when she needs to take the doxycycline, we discussed that two pills are to be taken after unprotected sex. Patient verbalized understanding and has no further questions.   Beryle Flock, RN

## 2022-08-16 NOTE — Telephone Encounter (Signed)
Received voicemail from patient, forwarded from front desk requesting call back from the nurse to discuss abdominal pain.   Called patient back, she states that shortly after we hung up she developed severe pain in her back and abdomen. Describes it as a 10/10 throbbing/shooting pain.   She tried taking some ibuprofen and acid reflux medication, but is in a lot of pain and laying on her bed "balled up in a knot."  We discussed that this kind of complaint can't be diagnosed over the phone and RN recommended in person evaluation at either urgent care or emergency department. Patient is agreeable to this plan.   Beryle Flock, RN

## 2022-08-17 ENCOUNTER — Ambulatory Visit (INDEPENDENT_AMBULATORY_CARE_PROVIDER_SITE_OTHER): Payer: Medicaid Other

## 2022-08-17 ENCOUNTER — Ambulatory Visit (HOSPITAL_COMMUNITY)
Admission: EM | Admit: 2022-08-17 | Discharge: 2022-08-17 | Disposition: A | Discharge status: Home / Self Care | Payer: Medicaid Other | Attending: Physician Assistant | Admitting: Physician Assistant

## 2022-08-17 ENCOUNTER — Encounter (HOSPITAL_COMMUNITY): Payer: Self-pay

## 2022-08-17 DIAGNOSIS — R109 Unspecified abdominal pain: Secondary | ICD-10-CM | POA: Diagnosis not present

## 2022-08-17 LAB — POCT URINALYSIS DIPSTICK, ED / UC
Bilirubin Urine: NEGATIVE
Glucose, UA: NEGATIVE mg/dL
Ketones, ur: NEGATIVE mg/dL
Leukocytes,Ua: NEGATIVE
Nitrite: NEGATIVE
Protein, ur: NEGATIVE mg/dL
Specific Gravity, Urine: 1.02 (ref 1.005–1.030)
Urobilinogen, UA: 0.2 mg/dL (ref 0.0–1.0)
pH: 5.5 (ref 5.0–8.0)

## 2022-08-17 MED ORDER — DICYCLOMINE HCL 20 MG PO TABS: 20.0000 mg | ORAL_TABLET | Freq: Two times a day (BID) | ORAL | 0 refills | Status: DC

## 2022-08-17 NOTE — Discharge Instructions (Signed)
Start dicyclomine twice daily.  Eat a bland diet and drink plenty of fluid.  Make sure that you are resting and drinking plenty of fluid.  As we discussed, if your symptoms are not improving within 24 hours or if at any point anything worsens you must go to the emergency room immediately.

## 2022-08-17 NOTE — ED Triage Notes (Signed)
Pt reports it hurts when she has a bowel movement. Pt reports her whole body hurts.  Pt would like an MRI scheduled.

## 2022-08-17 NOTE — ED Provider Notes (Signed)
[facility]    CSN: [id number] Arrival date & time: 08/17/22  1241      History   Chief Complaint Chief Complaint  Patient presents with   Abdominal Cramping   Abdominal Pain   Constipation    HPI Dawn [name] is a 51 y.o. female.   Patient presents today with a 2-day history of left-sided abdominal pain.  She reports pain is rated 10 on a 0-10 pain scale, described as sharp, worse when attempting to have a bowel movement, no alleviating factors identified.  She denies history of gastrointestinal disorder including diverticulitis, ulcerative colitis, Crohn's disease.  Reports that her bowel movements have been smaller and more difficult to pass.  Denies any melena, hematochezia, fever, nausea, vomiting.  She denies any urinary symptoms or pelvic pain.  She has no concern for STI; was recently tested and treated.  She does have a history of HIV but reports that she is undetectable and doing well regarding compliance with this medication.  She has had a C-section in the past but denies additional abdominal surgery and still has gallbladder and appendix.  Denies history of nephrolithiasis.    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   Vaginal discharge 08/14/2022   Enlarged thyroid gland 01/30/2022   Fibroids 07/12/2021   Need for prophylactic vaccination and inoculation against influenza 07/12/2021   Viral illness 01/08/2019   HIV disease ([place]) 03/05/2017   HTN (hypertension) 03/05/2017   Anemia due to chronic blood loss 03/13/2016   Acquired immunodeficiency syndrome ([place]) 12/07/2015   Acne vulgaris 12/07/2015   Episodic mood disorder ([place]) 12/07/2015    Past Surgical History:   Procedure Laterality Date   CESAREAN SECTION     DILATION AND CURETTAGE OF UTERUS     HAND TENDON SURGERY     right thumb   INDUCED ABORTION     TUBAL LIGATION      OB History     Gravida  6   Para  4   Term  4   Preterm      AB  2   Living  4      SAB  1   IAB  1   Ectopic      Multiple      Live Births  4            Home Medications    Prior to Admission medications   Medication Sig Start Date End Date Taking? Authorizing Provider  dicyclomine (BENTYL) 20 MG tablet Take 1 tablet (20 mg total) by mouth 2 (two) times daily. 08/17/22  Yes Alletta Mattos K, PA-C  albuterol (VENTOLIN HFA) 108 (90 Base) MCG/ACT inhaler Inhale 1 puff into the lungs every 6 (six) hours as needed for wheezing or shortness of breath. 06/14/21   Chase Picket, MD  DESCOVY 200-25 MG tablet TAKE ONE TABLET BY MOUTH DAILY 05/30/22   Carlyle Basques, MD  doxycycline (VIBRA-TABS) 100 MG tablet Take 2 tablets after unprotected sex to prevent STI 08/14/22   Tommy Medal, Lavell Islam, MD  ferrous sulfate 325 (65 FE) MG tablet Take 1 tablet (325 mg total) by mouth daily. 09/04/20  Petrucelli, Samantha R, PA-C  FLUoxetine (PROZAC) 20 MG capsule Take 1 capsule (20 mg total) by mouth daily. 04/17/22   Carlyle Basques, MD  hydrochlorothiazide (HYDRODIURIL) 25 MG tablet Take 0.5 tablets (12.5 mg total) by mouth daily. 06/22/22   Carlyle Basques, MD  hydrOXYzine (ATARAX) 25 MG tablet Take 1 tablet (25 mg total) by mouth every 8 (eight) hours as needed. 04/09/22   Jeanell Sparrow, DO  megestrol (MEGACE) 40 MG tablet Take 1 tablet (40 mg total) by mouth 2 (two) times daily. Can increase to two tablets twice a day in the event of heavy bleeding 03/17/21   Constant, Peggy, MD  metroNIDAZOLE (FLAGYL) 500 MG tablet Take 1 tablet (500 mg total) by mouth 2 (two) times daily. 08/14/22   Truman Hayward, MD  ondansetron (ZOFRAN ODT) 4 MG disintegrating tablet Take 1 tablet (4 mg total) by mouth every 8 (eight)  hours as needed for nausea or vomiting. 07/06/21   Deion Swift, Derry Skill, PA-C  potassium chloride SA (KLOR-CON) 20 MEQ tablet Take 1 tablet (20 mEq total) by mouth daily. 09/04/20   Petrucelli, Samantha R, PA-C  PREZCOBIX 800-150 MG tablet TAKE ONE TABLET BY MOUTH DAILY WITH BREAKFAST *SWALLOW WHOLE. DO NOT CRUSH, BREAK OR CHEW TABLETS* 05/30/22   Carlyle Basques, MD  promethazine-dextromethorphan (PROMETHAZINE-DM) 6.25-15 MG/5ML syrup Take 5 mLs by mouth 4 (four) times daily as needed for cough. 08/29/20   Caccavale, Sophia, PA-C  sucralfate (CARAFATE) 1 g tablet Take 1 tablet (1 g total) by mouth 4 (four) times daily -  with meals and at bedtime for 7 days. 04/09/22 04/16/22  Jeanell Sparrow, DO  valACYclovir (VALTREX) 1000 MG tablet TAKE ONE TABLET BY MOUTH DAILY 07/10/22   Carlyle Basques, MD  diphenhydrAMINE (BENADRYL) 25 MG tablet Take 1 tablet (25 mg total) by mouth every 6 (six) hours as needed for up to 3 days. 06/17/20 09/04/20  Lannie Fields, PA-C  famotidine (PEPCID) 20 MG tablet Take 1 tablet (20 mg total) by mouth 2 (two) times daily for 5 days. 06/17/20 09/04/20  Lannie Fields, PA-C    Family History Family History  Problem Relation Age of Onset   Breast cancer Neg Hx     Social History Social History   Tobacco Use   Smoking status: Former    Types: Cigarettes   Smokeless tobacco: Never  Vaping Use   Vaping Use: Never used  Substance Use Topics   Alcohol use: Yes    Comment: hx of alcohol abuse- social drinker now   Drug use: Not Currently    Types: Marijuana    Comment: none in 8 yrs     Allergies   Chocolate and Tomato   Review of Systems Review of Systems  Constitutional:  Positive for activity change. Negative for appetite change, fatigue and fever.  Gastrointestinal:  Positive for abdominal pain and constipation. Negative for diarrhea, nausea and vomiting.  Genitourinary:  Negative for dysuria, frequency, urgency, vaginal bleeding, vaginal discharge and vaginal pain.   Musculoskeletal:  Positive for back pain. Negative for arthralgias and myalgias.     Physical Exam Triage Vital Signs ED Triage Vitals  Enc Vitals Group     BP 08/17/22 1508 (!) 154/79     Pulse Rate 08/17/22 1508 97     Resp 08/17/22 1508 18     Temp 08/17/22 1508 98.4 F (36.9 C)     Temp Source 08/17/22 1508 Oral     SpO2 08/17/22 1508 99 %  Weight --      Height --      Head Circumference --      Peak Flow --      Pain Score 08/17/22 1509 5     Pain Loc --      Pain Edu? --      Excl. in [place]? --    No data found.  Updated Vital Signs BP (!) 154/79 (BP Location: Left Arm)   Pulse 97   Temp 98.4 F (36.9 C) (Oral)   Resp 18   SpO2 99%   Visual Acuity Right Eye Distance:   Left Eye Distance:   Bilateral Distance:    Right Eye Near:   Left Eye Near:    Bilateral Near:     Physical Exam Vitals reviewed.  Constitutional:      General: She is awake. She is not in acute distress.    Appearance: Normal appearance. She is well-developed. She is not ill-appearing.     Comments: Very pleasant female appears stated age in no acute distress sitting comfortably in exam room   HENT:     Head: Normocephalic and atraumatic.  Cardiovascular:     Rate and Rhythm: Normal rate and regular rhythm.     Heart sounds: Normal heart sounds, S1 normal and S2 normal. No murmur heard. Pulmonary:     Effort: Pulmonary effort is normal.     Breath sounds: Normal breath sounds. No wheezing, rhonchi or rales.     Comments: clear to auscultation bilaterally Abdominal:     General: Bowel sounds are normal.     Palpations: Abdomen is soft.     Tenderness: There is abdominal tenderness in the left upper quadrant and left lower quadrant. There is left CVA tenderness. There is no right CVA tenderness, guarding or rebound.     Comments: Tenderness palpation and left abdomen without evidence of acute abdomen.  Psychiatric:        Behavior: Behavior is cooperative.      UC Treatments  / Results  Labs (all labs ordered are listed, but only abnormal results are displayed) Labs Reviewed  POCT URINALYSIS DIPSTICK, ED / UC - Abnormal; Notable for the following components:      Result Value   Hgb urine dipstick LARGE (*)    All other components within normal limits    EKG   Radiology DG Abd 2 Views  Result Date: 08/17/2022 CLINICAL DATA:  Abdominal pain.  Pain with bowel movements. EXAM: ABDOMEN - 2 VIEW COMPARISON:  CT chest 03/26/2022 FINDINGS: Air is seen within nondistended loops of small and large bowel. Mild stool within the cecum and distal sigmoid colon/rectum. No portal venous gas or pneumatosis. Neither of the 2 images is marked as upright. Within this limitation, no definite free air is identified. On the second image, air is seen within a tubular structure overlying the right upper quadrant, possibly the common bile duct. On 03/26/2022 chest CT, mild central pneumobilia was seen, presumably related to prior surgery in this region. Recommend clinical correlation. The lung bases are clear. Numerous vascular phleboliths overlie the left-greater-than-right portions of the pelvis. Severe pubic symphysis joint space narrowing and subchondral sclerosis. IMPRESSION: 1. Nonobstructive bowel gas pattern. 2. Mild stool within the cecum and distal sigmoid colon/rectum. 3. Air within a tubular structure within the right upper quadrant compatible with the common bile duct. On 03/26/2022 chest CT, mild central pneumobilia was seen, presumably related to prior surgery in this region. Recommend clinical correlation with  surgical history. Electronically Signed   By: Yvonne Kendall M.D.   On: 08/17/2022 16:14    Procedures Procedures (including critical care time)  Medications Ordered in UC Medications - No data to display  Initial Impression / Assessment and Plan / UC Course  I have reviewed the triage vital signs and the nursing notes.  Pertinent labs & imaging results that were  available during my care of the patient were reviewed by me and considered in my medical decision making (see chart for details).     Patient is well-appearing, afebrile, nontoxic, nontachycardic.  Her vital signs and physical exam are reassuring today, however, given the severity of her pain we discussed the potential utility of going to the emergency room.  Patient declined this as she has previously been to the ER and had a very long wait.  She is agreeable to go if her symptoms worsen but repeatedly declined going to the ER during her visit today.  Discussed that we do not have any advanced imaging but x-ray was obtained.  This did not show any evidence of obstruction or acute findings.  She was started on dicyclomine to help manage her symptoms with instruction to eat a bland diet and drink plenty of fluid.  Recommended that she follow-up with gastroenterologist and was given contact information for local provider with instruction to call to schedule an appointment.  We discussed that if she is not feeling much better with medication or she develops any worsening symptoms including severe focal abdominal pain, melena, hematochezia, hematemesis, fever, weakness she needs to go to the emergency room immediately to which she expressed understanding.  Strict return precautions given.  Patient declined work excuse note.  Final Clinical Impressions(s) / UC Diagnoses   Final diagnoses:  Left sided abdominal pain  Abdominal cramping     Discharge Instructions      Start dicyclomine twice daily.  Eat a bland diet and drink plenty of fluid.  Make sure that you are resting and drinking plenty of fluid.  As we discussed, if your symptoms are not improving within 24 hours or if at any point anything worsens you must go to the emergency room immediately.     ED Prescriptions     Medication Sig Dispense Auth. Provider   dicyclomine (BENTYL) 20 MG tablet Take 1 tablet (20 mg total) by mouth 2 (two)  times daily. 10 tablet Adelyn Roscher, Derry Skill, PA-C      PDMP not reviewed this encounter.   Terrilee Croak, PA-C 08/17/22 1625

## 2022-08-18 ENCOUNTER — Telehealth (HOSPITAL_COMMUNITY): Payer: Self-pay | Admitting: Emergency Medicine

## 2022-08-18 NOTE — Telephone Encounter (Signed)
Received message that patient is wanting to review her recent visit and meds Attempted to reach patient x 1, VM is full

## 2022-09-04 ENCOUNTER — Ambulatory Visit: Payer: Commercial Managed Care - HMO | Admitting: Family Medicine

## 2022-09-05 ENCOUNTER — Ambulatory Visit: Payer: Medicaid Other | Admitting: Internal Medicine

## 2022-09-05 ENCOUNTER — Other Ambulatory Visit (HOSPITAL_COMMUNITY): Payer: Self-pay

## 2022-09-13 ENCOUNTER — Telehealth: Payer: Self-pay

## 2022-09-13 NOTE — Telephone Encounter (Signed)
Patient left voicemail with front desk requesting earlier appointment with Dr. Gilkeson Flattery regarding throat pain. Requested call back.  Attempted to call patient but not able to reach her at this time. Left voicemail requesting call back for more information.  Will need to ask patient how long she has had throat pain. If she has followed up with ENT or contacted their office. If she is having any other symptoms.  Leatrice Jewels, RMA

## 2022-09-18 DIAGNOSIS — S76011A Strain of muscle, fascia and tendon of right hip, initial encounter: Secondary | ICD-10-CM | POA: Diagnosis not present

## 2022-09-19 ENCOUNTER — Other Ambulatory Visit (HOSPITAL_COMMUNITY): Payer: Self-pay

## 2022-09-19 ENCOUNTER — Telehealth: Payer: Self-pay | Admitting: Pharmacist

## 2022-09-19 NOTE — Telephone Encounter (Signed)
Patient's insurance plan has updated this year and prefers Truvada to Descovy. Will refrain from sending new script for Truvada to replace Descovy as patient has not filled Prezcobix or Descovy since September 2023. Patient has follow-up with Dr. Ohnemus Flattery on 09/25/22.  Additionally, unclear if patient is supposed to be on Tivicay - a few notes from Dr. Rimel Flattery and most recent from Dr. Tommy Medal indicate continuing Tivicay though I do not see a prescription or fill history for this.   Alfonse Spruce, PharmD, CPP, BCIDP, [place] Clinical Pharmacist Practitioner [facility]

## 2022-09-25 ENCOUNTER — Ambulatory Visit: Payer: Medicaid Other | Admitting: Internal Medicine

## 2022-09-25 ENCOUNTER — Telehealth: Payer: Self-pay

## 2022-09-25 NOTE — Telephone Encounter (Signed)
Called patient regarding missed appointment. Left voicemail requesting call back. Leatrice Jewels, RMA

## 2022-09-29 ENCOUNTER — Encounter: Payer: Self-pay | Admitting: Family Medicine

## 2022-09-29 ENCOUNTER — Other Ambulatory Visit (HOSPITAL_COMMUNITY)
Admission: RE | Admit: 2022-09-29 | Discharge: 2022-09-29 | Disposition: A | Discharge status: Home / Self Care | Payer: Medicaid Other | Source: Ambulatory Visit | Attending: Family Medicine | Admitting: Family Medicine

## 2022-09-29 ENCOUNTER — Ambulatory Visit (INDEPENDENT_AMBULATORY_CARE_PROVIDER_SITE_OTHER): Payer: Medicaid Other | Admitting: Family Medicine

## 2022-09-29 VITALS — BP 122/76 | HR 101 | Ht 64.0 in | Wt 152.2 lb

## 2022-09-29 DIAGNOSIS — Z0000 Encounter for general adult medical examination without abnormal findings: Secondary | ICD-10-CM

## 2022-09-29 DIAGNOSIS — R442 Other hallucinations: Secondary | ICD-10-CM | POA: Diagnosis not present
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
## 2022-09-29 NOTE — Patient Instructions (Signed)
It was wonderful to see you today.  Please bring ALL of your medications with you to every visit.   Today we talked about:  Crawling sensation - I put a referral in for psychiatry. Please expect a phone call in 2 weeks.   We also did your pap smear today.   Please follow up in 2 months   Thank you for choosing [facility].   Please call [phone] with any questions about today's appointment.  Please be sure to schedule follow up at the front desk before you leave today.   Lowry Ram, MD  Family Medicine    Therapy and Counseling Resources Most providers on this list will take Medicaid. Patients with commercial insurance or Medicare should contact their insurance company to get a list of in network providers.  [employer] (takes children) Location 1: [address] [place], [place] [address] Location 2: [place], [place] [address] [place] ([place] speaking therapist available)(habla espanol)(take medicare and medicaid)  [place], [place], [place] [address], [place] [email or web address] [phone]  [facility]ing [address] [place], [place] [address] [place], [place], [place] [address]      [phone]  [place] (spanish available) [place], [place] [address] [place] (take [facility] and medicare) [address] [place], [place] [address]       [phone]     [place] (virtual only) [phone]  [facility] [address] [place], [place]  [facility]:  [place]. [place] [phone]  [facility]:  [address] [place] [phone]  [facility] (under & uninsured) [address] [place] [phone]    [email or web address]    [place] [address]  [place]    [phone]  [facility] [place]     Phone:  [phone]     [place]  [place] [address]      [place], [place] ext [place]: [place], [place], [place]   [place] ([place] therapist) [email or web address]  [place] [address]   [place] [address]    [phone]    [facility]   [address],  [place], [place]   [place]  [place]  [facility]  [address] [place], [place]        [phone]  Open Access/Walk In Clinic under & uninsured  [facility]  [address] [place], [place] Crisis [phone]  [facility] [place],  ([place])   [place], [place]: [phone]) 8:30 - 12; 1 - 2:30  [facility] of the [place],  [place], [place]    [phone]):8:30 - 12; 2 - 3PM  [facility] [employer],  [address],  [place]; [phone]):   Mon - Fri 8 AM - 5 PM  [facility] [place]   MWF 12:30 to 3:00 or call to  schedule an appointment  [phone]  Specific Provider options Psychology Today  [email or web address] click on find a therapist  enter your zip code left side and select or tailor a therapist for your specific need.   [facility] Provider Directory [email or web address]  (Medicaid)   Follow all drop down to find a provider  [place] or [email or web address] [address], [place], [place] Recovery support  and educational   24- Hour Availability:   [facility]  [address] [place], [place] Crisis [phone]  [facility] the [employer] [phone]  [place]  [phone]   [place]  [phone] (after hours)  Therapeutic Alternative/Mobile Crisis   [phone]  [place] National Suicide Hotline  [phone] [address] Monks)  Call 911 or go to emergency room  [facility]  [phone]);  [place] and [employer]  [phone]); [place], [place], [place], [place], [place], [place], [place]

## 2022-10-01 DIAGNOSIS — R442 Other hallucinations: Secondary | ICD-10-CM | POA: Insufficient documentation

## 2022-10-01 NOTE — Assessment & Plan Note (Signed)
Tactile hallucinations without any evidence of rash or otherwise. Patient declines any recent cocaine use in the last 10 years; thus, unlikely due to substance use. With tactile hallucinations combined with pressured speech, and patient's possible personal and family history of schizophrenia possible primary psychiatric illness.  - Psychiatry referral  - given resources for therapy  - F/u in 2 months

## 2022-10-05 ENCOUNTER — Encounter: Payer: Self-pay | Admitting: Internal Medicine

## 2022-10-05 ENCOUNTER — Other Ambulatory Visit: Payer: Self-pay

## 2022-10-05 ENCOUNTER — Ambulatory Visit (INDEPENDENT_AMBULATORY_CARE_PROVIDER_SITE_OTHER): Payer: 59 | Admitting: Internal Medicine

## 2022-10-05 ENCOUNTER — Other Ambulatory Visit (HOSPITAL_COMMUNITY)
Admission: RE | Admit: 2022-10-05 | Discharge: 2022-10-05 | Disposition: A | Discharge status: Home / Self Care | Payer: BLUE CROSS/BLUE SHIELD | Source: Ambulatory Visit | Attending: Internal Medicine | Admitting: Internal Medicine

## 2022-10-05 VITALS — BP 135/77 | HR 85 | Temp 98.7°F | Wt 152.0 lb

## 2022-10-05 DIAGNOSIS — Z79899 Other long term (current) drug therapy: Secondary | ICD-10-CM | POA: Insufficient documentation

## 2022-10-05 DIAGNOSIS — Z113 Encounter for screening for infections with a predominantly sexual mode of transmission: Secondary | ICD-10-CM | POA: Diagnosis not present

## 2022-10-05 DIAGNOSIS — B20 Human immunodeficiency virus [HIV] disease: Secondary | ICD-10-CM | POA: Diagnosis not present

## 2022-10-05 LAB — CYTOLOGY - PAP
Comment: NEGATIVE
Diagnosis: UNDETERMINED — AB
High risk HPV: NEGATIVE

## 2022-10-05 MED ORDER — ROSUVASTATIN CALCIUM 10 MG PO TABS: 10.0000 mg | ORAL_TABLET | Freq: Every day | ORAL | 11 refills | Status: DC

## 2022-10-05 MED ORDER — DESCOVY 200-25 MG PO TABS: 1.0000 | ORAL_TABLET | Freq: Every day | ORAL | 4 refills | Status: DC

## 2022-10-05 MED ORDER — PREZCOBIX 800-150 MG PO TABS: ORAL_TABLET | ORAL | 4 refills | Status: DC

## 2022-10-05 NOTE — Progress Notes (Signed)
Patient ID: Dawn [name], female   DOB: Nov 28, 1970, 52 y.o.   MRN: [id number]  HPI 52yo F with HIV disease, currently on descovy-prezcobix. Planning to move to [place]. Closer to family, and step daughter.  Doing well overall.   Outpatient Encounter Medications as of 10/05/2022  Medication Sig   albuterol (VENTOLIN HFA) 108 (90 Base) MCG/ACT inhaler Inhale 1 puff into the lungs every 6 (six) hours as needed for wheezing or shortness of breath.   DESCOVY 200-25 MG tablet TAKE ONE TABLET BY MOUTH DAILY   dicyclomine (BENTYL) 20 MG tablet Take 1 tablet (20 mg total) by mouth 2 (two) times daily.   ferrous sulfate 325 (65 FE) MG tablet Take 1 tablet (325 mg total) by mouth daily.   FLUoxetine (PROZAC) 20 MG capsule Take 1 capsule (20 mg total) by mouth daily.   hydrochlorothiazide (HYDRODIURIL) 25 MG tablet Take 0.5 tablets (12.5 mg total) by mouth daily.   megestrol (MEGACE) 40 MG tablet Take 1 tablet (40 mg total) by mouth 2 (two) times daily. Can increase to two tablets twice a day in the event of heavy bleeding   meloxicam (MOBIC) 7.5 MG tablet Take 7.5 mg by mouth daily.   PREZCOBIX 800-150 MG tablet TAKE ONE TABLET BY MOUTH DAILY WITH BREAKFAST *SWALLOW WHOLE. DO NOT CRUSH, BREAK OR CHEW TABLETS*   valACYclovir (VALTREX) 1000 MG tablet TAKE ONE TABLET BY MOUTH DAILY   sucralfate (CARAFATE) 1 g tablet Take 1 tablet (1 g total) by mouth 4 (four) times daily -  with meals and at bedtime for 7 days.   [DISCONTINUED] diphenhydrAMINE (BENADRYL) 25 MG tablet Take 1 tablet (25 mg total) by mouth every 6 (six) hours as needed for up to 3 days.   [DISCONTINUED] famotidine (PEPCID) 20 MG tablet Take 1 tablet (20 mg total) by mouth 2 (two) times daily for 5 days.   No facility-administered encounter medications on file as of 10/05/2022.     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   Vaginal discharge 08/14/2022    Enlarged thyroid gland 01/30/2022   Fibroids 07/12/2021   Need for prophylactic vaccination and inoculation against influenza 07/12/2021   Viral illness 01/08/2019   HIV disease ([place]) 03/05/2017   HTN (hypertension) 03/05/2017   Anemia due to chronic blood loss 03/13/2016   Acquired immunodeficiency syndrome ([place]) 12/07/2015   Acne vulgaris 12/07/2015   Episodic mood disorder ([place]) 12/07/2015     Health Maintenance Due  Topic Date Due   COLONOSCOPY (Pts 45-47yr Insurance coverage will need to be confirmed)  Never done   COVID-19 Vaccine (3 - Pfizer risk series) 07/20/2022     Review of Systems Review of Systems  Constitutional: Negative for fever, chills, diaphoresis, activity change, appetite change, fatigue and unexpected weight change.  HENT: Negative for congestion, sore throat, rhinorrhea, sneezing, trouble swallowing and sinus pressure.  Eyes: Negative for photophobia and visual disturbance.  Respiratory: Negative for cough, chest tightness, shortness of breath, wheezing and stridor.  Cardiovascular: Negative for chest pain, palpitations and leg swelling.  Gastrointestinal: Negative for nausea, vomiting, abdominal pain, diarrhea, constipation, blood in stool, abdominal distention and anal bleeding.  Genitourinary: Negative for dysuria, hematuria, flank pain and difficulty urinating.  Musculoskeletal: Negative for myalgias, back pain, joint swelling, arthralgias and gait problem.  Skin: Negative for color change, pallor, rash and wound.  Neurological: Negative for dizziness, tremors, weakness and light-headedness.  Hematological: Negative for  adenopathy. Does not bruise/bleed easily.  Psychiatric/Behavioral: Negative for behavioral problems, confusion, sleep disturbance, dysphoric mood, decreased concentration and agitation.   Physical Exam   BP 135/77   Pulse 85   Temp 98.7 F (37.1 C) (Oral)   Wt 152 lb (68.9 kg)   LMP 09/15/2022 (Exact Date)   SpO2 100%   BMI 26.09  kg/m   Physical Exam  Constitutional:  oriented to person, place, and time. appears well-developed and well-nourished. No distress.  HENT: [place]/AT, PERRLA, no scleral icterus Mouth/Throat: Oropharynx is clear and moist. No oropharyngeal exudate.  Cardiovascular: Normal rate, regular rhythm and normal heart sounds. Exam reveals no gallop and no friction rub.  No murmur heard.  Pulmonary/Chest: Effort normal and breath sounds normal. No respiratory distress.  has no wheezes.  Neck = supple, no nuchal rigidity Abdominal: Soft. Bowel sounds are normal.  exhibits no distension. There is no tenderness.  Lymphadenopathy: no cervical adenopathy. No axillary adenopathy Neurological: alert and oriented to person, place, and time.  Skin: Skin is warm and dry. No rash noted. No erythema.  Psychiatric: a normal mood and affect.  behavior is normal.   Lab Results  Component Value Date   CD4TCELL 44 04/17/2022   Lab Results  Component Value Date   CD4TABS 565 04/17/2022   CD4TABS 746 07/12/2021   CD4TABS 537 02/02/2021   Lab Results  Component Value Date   HIV1RNAQUANT Not Detected 08/14/2022   Lab Results  Component Value Date   HEPBSAB POS (A) 12/09/2015   Lab Results  Component Value Date   LABRPR NON-REACTIVE 08/14/2022    CBC Lab Results  Component Value Date   WBC 4.6 06/28/2022   RBC 3.46 (L) 06/28/2022   HGB 10.3 (L) 06/28/2022   HCT 31.7 (L) 06/28/2022   PLT 305 06/28/2022   MCV 91.6 06/28/2022   MCH 29.8 06/28/2022   MCHC 32.5 06/28/2022   RDW 13.7 06/28/2022   LYMPHSABS 1.0 06/28/2022   MONOABS 0.4 06/28/2022   EOSABS 0.0 06/28/2022    BMET Lab Results  Component Value Date   NA 138 06/28/2022   K 3.4 (L) 06/28/2022   CL 106 06/28/2022   CO2 24 06/28/2022   GLUCOSE 90 06/28/2022   BUN 8 06/28/2022   CREATININE 0.98 06/28/2022   CALCIUM 9.0 06/28/2022   GFRNONAA >60 06/28/2022   GFRAA 87 02/02/2021      Assessment and Plan  Hiv disease= will check  labs and give refills Long term medication management maintenance = will check cr is stable  Hx of ASCUS abn pap = will need follow up with ob/gyn. Will reach out dr Gwendolyn Lima.  Health maintenance = will start rosuvastatin for reprieve trial results/heart disease prevention. Also check lipid profile

## 2022-10-06 LAB — URINE CYTOLOGY ANCILLARY ONLY
Chlamydia: NEGATIVE
Comment: NEGATIVE
Comment: NORMAL
Neisseria Gonorrhea: NEGATIVE

## 2022-10-06 LAB — T-HELPER CELL (CD4) - (RCID CLINIC ONLY)
CD4 % Helper T Cell: 49 % (ref 33–65)
CD4 T Cell Abs: 488 /uL (ref 400–1790)

## 2022-10-06 NOTE — Progress Notes (Signed)
ASCUS HPV negative per [facility] 3 year repeat Pap with HPV co testing

## 2022-10-09 LAB — COMPLETE METABOLIC PANEL WITH GFR
AG Ratio: 1.2 (calc) (ref 1.0–2.5)
ALT: 8 U/L (ref 6–29)
AST: 11 U/L (ref 10–35)
Albumin: 4.1 g/dL (ref 3.6–5.1)
Alkaline phosphatase (APISO): 48 U/L (ref 31–125)
BUN/Creatinine Ratio: 13 (calc) (ref 6–22)
BUN: 14 mg/dL (ref 7–25)
CO2: 26 mmol/L (ref 20–32)
Calcium: 9.4 mg/dL (ref 8.6–10.2)
Chloride: 105 mmol/L (ref 98–110)
Creat: 1.1 mg/dL — ABNORMAL HIGH (ref 0.50–0.99)
Globulin: 3.5 g/dL (calc) (ref 1.9–3.7)
Glucose, Bld: 83 mg/dL (ref 65–99)
Potassium: 3.6 mmol/L (ref 3.5–5.3)
Sodium: 141 mmol/L (ref 135–146)
Total Bilirubin: 0.4 mg/dL (ref 0.2–1.2)
Total Protein: 7.6 g/dL (ref 6.1–8.1)
eGFR: 62 mL/min/{1.73_m2} (ref >=60)

## 2022-10-09 LAB — CBC WITH DIFFERENTIAL/PLATELET
Absolute Monocytes: 320 cells/uL (ref 200–950)
Basophils Absolute: 20 cells/uL (ref 0–200)
Basophils Relative: 0.6 %
Eosinophils Absolute: 41 cells/uL (ref 15–500)
Eosinophils Relative: 1.2 %
HCT: 32 % — ABNORMAL LOW (ref 35.0–45.0)
Hemoglobin: 10 g/dL — ABNORMAL LOW (ref 11.7–15.5)
Lymphs Abs: 1251 cells/uL (ref 850–3900)
MCH: 25.6 pg — ABNORMAL LOW (ref 27.0–33.0)
MCHC: 31.3 g/dL — ABNORMAL LOW (ref 32.0–36.0)
MCV: 82.1 fL (ref 80.0–100.0)
MPV: 10.7 fL (ref 7.5–12.5)
Monocytes Relative: 9.4 %
Neutro Abs: 1768 cells/uL (ref 1500–7800)
Neutrophils Relative %: 52 %
Platelets: 316 10*3/uL (ref 140–400)
RBC: 3.9 10*6/uL (ref 3.80–5.10)
RDW: 14.4 % (ref 11.0–15.0)
Total Lymphocyte: 36.8 %
WBC: 3.4 10*3/uL — ABNORMAL LOW (ref 3.8–10.8)

## 2022-10-09 LAB — HIV-1 RNA QUANT-NO REFLEX-BLD
HIV 1 RNA Quant: <20 Copies/mL — ABNORMAL HIGH
HIV-1 RNA Quant, Log: <1.3 Log cps/mL — ABNORMAL HIGH

## 2022-10-09 LAB — LIPID PANEL
Cholesterol: 208 mg/dL — ABNORMAL HIGH (ref <200)
HDL: 50 mg/dL (ref >=50)
LDL Cholesterol (Calc): 135 mg/dL (calc) — ABNORMAL HIGH
Non-HDL Cholesterol (Calc): 158 mg/dL (calc) — ABNORMAL HIGH (ref <130)
Total CHOL/HDL Ratio: 4.2 (calc) (ref <5.0)
Triglycerides: 121 mg/dL (ref <150)

## 2022-10-09 LAB — RPR: RPR Ser Ql: NONREACTIVE

## 2022-10-17 NOTE — Congregational Nurse Program (Signed)
  Dept: [phone]   Congregational Nurse Program Note  Date of Encounter: 10/17/2022  Clinic visit to check blood pressure, BP 135/80, Pulse 84 and regular, O2 Sat 99%. Educated regarding normal blood pressure levels and strategies to lower blood pressure including dietary choices. 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 Yes    Student Assistance N/A    Location Patient [place] [name]    Visit Setting with Client Organization    Patient Status Unknown   Has apartment at [facility]    Insurance/Financial Assistance Referral N/A    Medication N/A    Medical Provider No    Screening Referrals Made N/A    Medical Referrals Made N/A    Medical Appointment Made N/A    Recently w/o PCP, now 1st time PCP visit completed due to CNs referral or appointment made N/A    Food Have Food Insecurities    Transportation Need transportation assistance    Housing/Utilities N/A    Interpersonal Safety N/A    Interventions Spiritual Care;Counsel;Advocate/Support;Educate    Abnormal to Normal Screening Since Last CN Visit N/A    Screenings CN Performed Blood Pressure;Pulse Ox    Sent Client to Lab for: N/A    Did client attend any of the following based off CNs referral or appointments made? N/A    ED Visit Averted N/A    Life-Saving Intervention Made N/A

## 2022-10-18 ENCOUNTER — Telehealth: Payer: Self-pay

## 2022-10-18 NOTE — Telephone Encounter (Signed)
Patient called complaining of a herpes outbreak. Patient advised she has valtrex on file at the pharmacy and per Dr. Dias Flattery patient will need to take Valtrex 1000 mg BID x 7 days. Patient advised on how to take the Valtrex and I have confirmed with the pharmacy that patient has the medication on file. [facility] will have medication ready for the patient today and patient informed. Feven Alderfer T Brooks Sailors

## 2022-10-19 ENCOUNTER — Other Ambulatory Visit: Payer: Self-pay

## 2022-10-19 ENCOUNTER — Telehealth: Payer: Self-pay

## 2022-10-19 NOTE — Telephone Encounter (Signed)
Patient called stating that she received all of her medications except prezcobix.  Spoke to pharmacist at [facility]. All medications was delivered to the patient. The pharmacist stated that he would call patient to go over medications.   [place], CMA

## 2022-10-23 NOTE — Telephone Encounter (Signed)
Received voicemail from patient stating she has not received her Prezcobix or valacyclovir from [facility]. Called patient back, conferenced in pharmacy to discuss missing medication.   [facility] pharmacist reports all medications were mailed to patient in two blister packs, one for the morning and one for the evening.   Pharmacy reports the following medications were sent: Fluoxetine Meloxicam Prezcobix [place]  Valacyclovir  Rosuvastatin  Hydrochlorothiazide   Beryle Flock, RN

## 2022-10-31 ENCOUNTER — Other Ambulatory Visit: Payer: Self-pay | Admitting: Internal Medicine

## 2022-10-31 DIAGNOSIS — B009 Herpesviral infection, unspecified: Secondary | ICD-10-CM

## 2022-11-15 DIAGNOSIS — S76011A Strain of muscle, fascia and tendon of right hip, initial encounter: Secondary | ICD-10-CM | POA: Diagnosis not present

## 2022-11-16 ENCOUNTER — Telehealth: Payer: Self-pay

## 2022-11-16 ENCOUNTER — Ambulatory Visit (HOSPITAL_COMMUNITY)
Admission: EM | Admit: 2022-11-16 | Discharge: 2022-11-16 | Disposition: A | Discharge status: Home / Self Care | Payer: Medicaid Other | Attending: Emergency Medicine | Admitting: Emergency Medicine

## 2022-11-16 ENCOUNTER — Encounter (HOSPITAL_COMMUNITY): Payer: Self-pay

## 2022-11-16 DIAGNOSIS — H10021 Other mucopurulent conjunctivitis, right eye: Secondary | ICD-10-CM | POA: Diagnosis not present

## 2022-11-16 DIAGNOSIS — J3089 Other allergic rhinitis: Secondary | ICD-10-CM | POA: Diagnosis not present

## 2022-11-16 MED ORDER — ERYTHROMYCIN 5 MG/GM OP OINT: TOPICAL_OINTMENT | OPHTHALMIC | 0 refills | Status: DC

## 2022-11-16 MED ORDER — GUAIFENESIN ER 600 MG PO TB12: 1200.0000 mg | ORAL_TABLET | Freq: Two times a day (BID) | ORAL | 0 refills | Status: AC

## 2022-11-16 MED ORDER — FLUTICASONE PROPIONATE 50 MCG/ACT NA SUSP: 1.0000 | Freq: Every day | NASAL | 2 refills | Status: DC

## 2022-11-16 MED ORDER — OLOPATADINE HCL 0.1 % OP SOLN: 1.0000 [drp] | Freq: Two times a day (BID) | OPHTHALMIC | 12 refills | Status: AC

## 2022-11-16 NOTE — Telephone Encounter (Signed)
Patient called wanting to ask Dr.Snider for a prescription for possible pink eye. I advised patient a provider would need to examine her eye before a prescription is sent in. Advised patient to contact PCP for an appointment to be evaluated.    [place], CMA

## 2022-11-16 NOTE — ED Triage Notes (Signed)
Pt reports headache and sinus pressure x 2 days. Pt states her eye are pink and itchy.

## 2022-11-16 NOTE — ED Provider Notes (Signed)
[facility]    CSN: [id number] Arrival date & time: 11/16/22  1240      History   Chief Complaint Chief Complaint  Patient presents with   Facial Pain   Headache   Conjunctivitis    HPI Dawn [name] is a 52 y.o. female.   Patient reports sinus pressure and headache, nasal congestion, and watery eye discharge for the past 2 days.  Last night she woke up to her right eye with increased discharge, conjunctival erythema, and itching.  Reports right eye is causing her discomfort today and it is sensitive to light.  She went to bed and her eye was okay except for its watery discharge.  She does not wear contacts or glasses. Reports vision intact, but does report she needs an eye exam for glasses.   She denies fever, cough, shortness of breath.  She does have overall anxiety about her health care.  History of HIV, reports medication compliance.   The history is provided by the patient and medical records.  Headache Associated symptoms: drainage and photophobia   Associated symptoms: no abdominal pain, no back pain, no cough, no ear pain, no eye pain, no fever, no seizures, no sore throat and no vomiting   Conjunctivitis Associated symptoms include headaches. Pertinent negatives include no chest pain, no abdominal pain and no shortness of breath.    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   Vaginal discharge 08/14/2022   Enlarged thyroid gland 01/30/2022   Fibroids 07/12/2021   Need for prophylactic vaccination and inoculation against influenza 07/12/2021   Viral illness 01/08/2019   HIV disease ([place]) 03/05/2017   HTN (hypertension) 03/05/2017   Anemia  due to chronic blood loss 03/13/2016   Acquired immunodeficiency syndrome ([place]) 12/07/2015   Acne vulgaris 12/07/2015   Episodic mood disorder ([place]) 12/07/2015    Past Surgical History:  Procedure Laterality Date   CESAREAN SECTION     DILATION AND CURETTAGE OF UTERUS     HAND TENDON SURGERY     right thumb   INDUCED ABORTION     TUBAL LIGATION      OB History     Gravida  6   Para  4   Term  4   Preterm      AB  2   Living  4      SAB  1   IAB  1   Ectopic      Multiple      Live Births  4            Home Medications    Prior to Admission medications   Medication Sig Start Date End Date Taking? Authorizing Provider  albuterol (VENTOLIN HFA) 108 (90 Base) MCG/ACT inhaler Inhale 1 puff into the lungs every 6 (six) hours as needed for wheezing or shortness of breath. 06/14/21  Yes Lamptey, Myrene Galas, MD  darunavir-cobicistat (PREZCOBIX) 800-150 MG tablet TAKE ONE TABLET BY MOUTH DAILY WITH BREAKFAST *SWALLOW WHOLE. DO NOT CRUSH, BREAK OR CHEW TABLETS* 10/05/22  Yes Carlyle Basques, MD  dicyclomine (BENTYL) 20 MG tablet Take 1 tablet (20 mg total)  by mouth 2 (two) times daily. 08/17/22  Yes Raspet, Erin K, PA-C  emtricitabine-tenofovir AF (DESCOVY) 200-25 MG tablet Take 1 tablet by mouth daily. 10/05/22  Yes Carlyle Basques, MD  erythromycin ophthalmic ointment Place a 1/2 inch ribbon of ointment into the lower eyelid. 11/16/22  Yes Louretta Shorten, [place] N, FNP  ferrous sulfate 325 (65 FE) MG tablet Take 1 tablet (325 mg total) by mouth daily. 09/04/20  Yes Petrucelli, Samantha R, PA-C  FLUoxetine (PROZAC) 20 MG capsule Take 1 capsule (20 mg total) by mouth daily. 04/17/22  Yes Carlyle Basques, MD  fluticasone [facility]) 50 MCG/ACT nasal spray Place 1 spray into both nostrils daily for 5 days. 11/16/22 11/21/22 Yes Louretta Shorten, [place] N, FNP  guaiFENesin (MUCINEX) 600 MG 12 hr tablet Take 2 tablets (1,200 mg total) by mouth 2 (two) times daily for 10 days. 11/16/22 11/26/22 Yes  Louretta Shorten, [place] N, FNP  hydrochlorothiazide (HYDRODIURIL) 25 MG tablet Take 0.5 tablets (12.5 mg total) by mouth daily. 06/22/22  Yes Carlyle Basques, MD  meloxicam (MOBIC) 7.5 MG tablet Take 7.5 mg by mouth daily. 09/19/22  Yes [provider]  olopatadine (PATADAY) 0.1 % ophthalmic solution Place 1 drop into the right eye 2 (two) times daily for 10 days. 11/16/22 11/26/22 Yes Louretta Shorten, [place] N, FNP  rosuvastatin (CRESTOR) 10 MG tablet Take 1 tablet (10 mg total) by mouth daily. 10/05/22  Yes Carlyle Basques, MD  valACYclovir (VALTREX) 1000 MG tablet TAKE ONE TABLET BY MOUTH DAILY 10/31/22  Yes Carlyle Basques, MD  diphenhydrAMINE (BENADRYL) 25 MG tablet Take 1 tablet (25 mg total) by mouth every 6 (six) hours as needed for up to 3 days. 06/17/20 09/04/20  Lannie Fields, PA-C  famotidine (PEPCID) 20 MG tablet Take 1 tablet (20 mg total) by mouth 2 (two) times daily for 5 days. 06/17/20 09/04/20  Lannie Fields, PA-C    Family History Family History  Problem Relation Age of Onset   Breast cancer Neg Hx     Social History Social History   Tobacco Use   Smoking status: Former    Types: Cigarettes   Smokeless tobacco: Never  Vaping Use   Vaping Use: Never used  Substance Use Topics   Alcohol use: Yes    Comment: hx of alcohol abuse- social drinker now   Drug use: Not Currently    Types: Marijuana    Comment: none in 8 yrs     Allergies   Chocolate and Tomato   Review of Systems Review of Systems  Constitutional:  Negative for chills and fever.  HENT:  Positive for postnasal drip and rhinorrhea. Negative for ear pain and sore throat.   Eyes:  Positive for photophobia, discharge, redness and itching. Negative for pain.  Respiratory:  Negative for cough and shortness of breath.   Cardiovascular:  Negative for chest pain and palpitations.  Gastrointestinal:  Negative for abdominal pain and vomiting.  Genitourinary:  Negative for dysuria and hematuria.  Musculoskeletal:   Negative for arthralgias and back pain.  Skin:  Negative for color change and rash.  Neurological:  Positive for headaches. Negative for seizures and syncope.  All other systems reviewed and are negative.    Physical Exam Triage Vital Signs ED Triage Vitals  Enc Vitals Group     BP 11/16/22 1402 (!) 154/82     Pulse Rate 11/16/22 1404 82     Resp 11/16/22 1404 18     Temp 11/16/22 1402 98.1 F (36.7 C)  Temp Source 11/16/22 1402 Oral     SpO2 --      Weight --      Height --      Head Circumference --      Peak Flow --      Pain Score 11/16/22 1404 3     Pain Loc --      Pain Edu? --      Excl. in [place]? --    No data found.  Updated Vital Signs BP (!) 154/82   Pulse 82   Temp 98.1 F (36.7 C) (Oral)   Resp 18   Visual Acuity Right Eye Distance:   Left Eye Distance:   Bilateral Distance:    Right Eye Near:   Left Eye Near:    Bilateral Near:     Physical Exam Vitals and nursing note reviewed.  Constitutional:      General: She is not in acute distress.    Appearance: She is well-developed.  HENT:     Head: Normocephalic and atraumatic.     Nose: Congestion and rhinorrhea present.     Mouth/Throat:     Mouth: Mucous membranes are moist.     Pharynx: Posterior oropharyngeal erythema present.  Eyes:     General: Allergic shiner present. No scleral icterus.       Right eye: Discharge present. No foreign body.        Left eye: No foreign body.     Extraocular Movements: Extraocular movements intact.     Conjunctiva/sclera:     Right eye: Right conjunctiva is injected.     Pupils: Pupils are equal, round, and reactive to light.  Cardiovascular:     Rate and Rhythm: Normal rate and regular rhythm.     Heart sounds: Normal heart sounds. No murmur heard. Pulmonary:     Effort: Pulmonary effort is normal. No respiratory distress.     Breath sounds: Normal breath sounds.  Musculoskeletal:        General: No swelling. Normal range of motion.     Cervical  back: Normal range of motion and neck supple.  Lymphadenopathy:     Cervical: Cervical adenopathy present.  Skin:    General: Skin is warm and dry.     Capillary Refill: Capillary refill takes less than 2 seconds.  Neurological:     Mental Status: She is alert.     GCS: GCS eye subscore is 4. GCS verbal subscore is 5. GCS motor subscore is 6.  Psychiatric:        Mood and Affect: Mood normal.        Behavior: Behavior is cooperative.      UC Treatments / Results  Labs (all labs ordered are listed, but only abnormal results are displayed) Labs Reviewed - No data to display  EKG   Radiology No results found.  Procedures Procedures (including critical care time)  Medications Ordered in UC Medications - No data to display  Initial Impression / Assessment and Plan / UC Course  I have reviewed the triage vital signs and the nursing notes.  Pertinent labs & imaging results that were available during my care of the patient were reviewed by me and considered in my medical decision making (see chart for details).  Vitals in triage reviewed, patient is hemodynamically stable. Reports BP elevated d/t anxiety around healthcare providers.  Due to her right eye conjunctival erythema and HIV status, will cover with erythromycin for potential bacterial etiology.  Will also cover  for allergic etiology with Pataday.  Nasal turbinates enlarged, rhinorrhea and postnasal drip noted with mild cervical LAD.  Afebrile, without tachycardia, lungs vesicular, low concern for systemic bacterial infection at this point.  Advised symptomatic relief for nasal congestion and allergic rhinitis.  Advised strict follow-up with ophthalmology if vision changes, or no improvement.  Patient verbalized understanding of treatment plan, no questions at this time.    Final Clinical Impressions(s) / UC Diagnoses   Final diagnoses:  Other mucopurulent conjunctivitis of right eye  Allergic rhinitis due to other  allergic trigger, unspecified seasonality     Discharge Instructions      It was a pleasure taking care of you today.  Overall your nasal congestion and drainage appear to be allergic in nature, please take the Mucinex twice daily and you can use the Flonase spray for the next 5 days to help with your nasal congestion.  I am covering you for bacterial conjunctivitis with erythromycin ointment, please also alternate with the Pataday solution for allergies.  You can start taking a daily antihistamine like cetirizine or Zyrtec, this is available over-the-counter. Please follow-up with ophthalmology if no improvement, and for vision evaluation for potential glasses.  Please seek immediate care if you develop loss of vision, extreme pain in your eye, or worsening of symptoms.      ED Prescriptions     Medication Sig Dispense Auth. Provider   erythromycin ophthalmic ointment Place a 1/2 inch ribbon of ointment into the lower eyelid. 3.5 g Louretta Shorten, [place], FNP   olopatadine (PATADAY) 0.1 % ophthalmic solution Place 1 drop into the right eye 2 (two) times daily for 10 days. 5 mL Louretta Shorten, [place], FNP   guaiFENesin (MUCINEX) 600 MG 12 hr tablet Take 2 tablets (1,200 mg total) by mouth 2 (two) times daily for 10 days. 40 tablet Louretta Shorten, [place] N, [place]   fluticasone [facility]) 50 MCG/ACT nasal spray Place 1 spray into both nostrils daily for 5 days. 9.9 mL Lendell Gallick, [place] N, FNP      PDMP not reviewed this encounter.   Erlean Mealor, [place] N, [place] 11/16/22 [phone]

## 2022-11-16 NOTE — Discharge Instructions (Addendum)
It was a pleasure taking care of you today.  Overall your nasal congestion and drainage appear to be allergic in nature, please take the Mucinex twice daily and you can use the Flonase spray for the next 5 days to help with your nasal congestion.  I am covering you for bacterial conjunctivitis with erythromycin ointment, please also alternate with the Pataday solution for allergies.  You can start taking a daily antihistamine like cetirizine or Zyrtec, this is available over-the-counter. Please follow-up with ophthalmology if no improvement, and for vision evaluation for potential glasses.  Please seek immediate care if you develop loss of vision, extreme pain in your eye, or worsening of symptoms.

## 2022-11-22 ENCOUNTER — Ambulatory Visit (HOSPITAL_COMMUNITY): Payer: 59 | Admitting: Student

## 2022-11-28 NOTE — Congregational Nurse Program (Signed)
  Dept: [phone]   Congregational Nurse Program Note  Date of Encounter: 11/28/2022  Clinic visit to have blood pressure checked, BP 115/62, pulse 94 and regular, O2 Sat 98%.  Began taking HCTZ 25 mg daily last of October with BP now within normal limits.  Educated regarding need to continue medication as prescribed to maintain normal blood pressure.  Educated on complications of hypertension that are preventable. 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 Yes    Student Assistance N/A    Location Patient [place]    Visit Setting with Client Organization    Patient Status Unknown   Has apartment at [facility]    Insurance/Financial Assistance Referral N/A    Medication N/A    Medical Provider No    Screening Referrals Made N/A    Medical Referrals Made N/A    Medical Appointment Made N/A    Recently w/o PCP, now 1st time PCP visit completed due to CNs referral or appointment made N/A    Food Have Food Insecurities    Transportation Need transportation assistance    Housing/Utilities N/A    Interpersonal Safety N/A    Interventions Spiritual Care;Counsel;Advocate/Support;Educate    Abnormal to Normal Screening Since Last CN Visit --    Screenings CN Performed Blood Pressure;Pulse Ox    Sent Client to Lab for: N/A    Did client attend any of the following based off CNs referral or appointments made? N/A    ED Visit Averted N/A    Life-Saving Intervention Made N/A

## 2022-11-30 ENCOUNTER — Ambulatory Visit (INDEPENDENT_AMBULATORY_CARE_PROVIDER_SITE_OTHER): Payer: 59 | Admitting: Family Medicine

## 2022-11-30 ENCOUNTER — Other Ambulatory Visit: Payer: Self-pay

## 2022-11-30 ENCOUNTER — Encounter: Payer: Self-pay | Admitting: Family Medicine

## 2022-11-30 VITALS — BP 129/84 | HR 96 | Ht 64.0 in | Wt 160.0 lb

## 2022-11-30 DIAGNOSIS — Z30013 Encounter for initial prescription of injectable contraceptive: Secondary | ICD-10-CM

## 2022-11-30 DIAGNOSIS — F209 Schizophrenia, unspecified: Secondary | ICD-10-CM

## 2022-11-30 DIAGNOSIS — L700 Acne vulgaris: Secondary | ICD-10-CM | POA: Diagnosis not present

## 2022-11-30 LAB — POCT URINE PREGNANCY: Preg Test, Ur: NEGATIVE

## 2022-11-30 MED ORDER — TRETINOIN 0.05 % EX CREA: TOPICAL_CREAM | Freq: Every day | CUTANEOUS | 0 refills | Status: DC

## 2022-11-30 MED ORDER — MEDROXYPROGESTERONE ACETATE 150 MG/ML IM SUSY
150.0000 mg | PREFILLED_SYRINGE | Freq: Once | INTRAMUSCULAR | Status: AC
  Administered 2022-11-30: 150 mg via INTRAMUSCULAR

## 2022-11-30 NOTE — Progress Notes (Signed)
F/u

## 2022-11-30 NOTE — Progress Notes (Signed)
    SUBJECTIVE:   CHIEF COMPLAINT / HPI:   Skin Concerns Patient says that she struggles with acne and hyperpigmentation thereafter.  Says that she has struggled with this for many years and has tried "everything".  Says that she has tried many options from the drugstore years ago.  He is interested in procedures to help hyperpigmentation such as microdermabrasion, but also has financial barriers.  Psychiatric Concerns  Patient says that she has been anxious.  She says that she is not sure why she did not go to the psychiatry appointment that was made after her last referral.  She says that she must of missed it due to forgetting or misplacing the time of the appointment.  She is still interested in going to this appointment as she feels like her schizophrenia symptoms are not controlled.  She also says that she has not reached out to a therapist though she would still be interested.  Patient denies current SI or plan, HI.   PERTINENT  PMH / PSH: Schizophrenia (patient reported), HIV, Acne vulgaris   OBJECTIVE:   BP 129/84   Pulse 96   Ht 5\' 4"  (1.626 m)   Wt 160 lb (72.6 kg)   SpO2 100%   BMI 27.46 kg/m   General: well appearing, in no acute distress CV: RRR, radial pulses equal and palpable Resp: Normal work of breathing on room air Abd: Soft, non tender, non distended  Neuro: Alert & Oriented  Psyc: Thoughts disorganized, flight of ideas, pressured speech  Derm: some cystic pustules on the face with pitted scars and hyperpigmentation   ASSESSMENT/PLAN:   Acne vulgaris Patient was amenable to trying skin care plus pharmacologic treatment before dermatology referral. - Gentle cleanser with sunscreen in the morning, at night cleanse with gentle cleanser apply unscented moisturizer and tretinoin - Pregnancy test today and starting Depo and prescribing 0.05% tretinoin - Follow-up in 2 months  Schizophrenia Patient reported history of schizophrenia.  She does have disorganized  thought and flight of ideas and has reported tactile hallucinations last visit.  Though PHQ-9 had positive question 9 she does not have active plan or thoughts today of SI.  Psychiatry referral already made last visit and patient is amenable to visit.  Believe that psychiatry would be very beneficial for this patient to gain more organization in her life. - Offered phone number to call psychiatry office to reschedule appointment - Offered counseling services covered by Medicaid -Follow-up in 2 months     Lockie Mola, MD [facility]

## 2022-11-30 NOTE — Patient Instructions (Addendum)
It was wonderful to see you today.  Please bring ALL of your medications with you to every visit.   Today we talked about:  Acne and skin concerns - Start using tretinoin. You MUST wear a sunscreen during the day time.   Psychiatry. Make sure you call psychiatry and set up an appointment with them for schizophrenia medications.  [facility]  [address] [place], [place] [address]  Please follow up in 2 months   Thank you for choosing [facility].   Please call [phone] with any questions about today's appointment.  Please be sure to schedule follow up at the front desk before you leave today.   Lowry Ram, MD  Family Medicine    Therapy and Counseling Resources Most providers on this list will take Medicaid. Patients with commercial insurance or Medicare should contact their insurance company to get a list of in network providers.  [employer] (takes children) Location 1: [address] [place], [place] [address] Location 2: [place], [place] [address] [place] ([place] speaking therapist available)(habla espanol)(take medicare and medicaid)  [place], [place], [place] [address], [place] [email or web address] [phone]  [facility]iatry and Counseling [address] [facility] [place], [place] [address] [place], [place], [place] [address]      [phone]  [place] (spanish available) [place],  [address] [place] (take [facility] and medicare) [address], [place],  [address]       [phone]     [place] (virtual only) [phone]  [facility] [address], [place], [place]  [facility]:  [place].  [place] [phone]  [facility]:  [place] [address] [phone]  [facility] (under & uninsured) [address] [place] [phone]    [email or web address]    [place] [phone] [address]  [place]    [phone]  [facility] [place]     Phone:  [phone]     [place]  [place] [address]      [place], [place] ext [place]: [place], [place], [place]   [place] ([place] therapist) [email or web address]  [place] [address]   [place] [address]    [phone]    [facility]   [address],  [place], [place]   [place]  [place]  [facility]  [address] [place], [place]        [phone]  Open Access/Walk In Clinic under & uninsured  [facility]  [address] [place], [place] [facility] [phone]  [facility] the [place],  ([place])   [place], [place]: [phone]) 8:30 - 12; 1 - 2:30  [facility] of the [place],  [place], [place]    [phone]):8:30 - 12; 2 - 3PM  [facility] [employer],  [address]  [place]; [phone]):  Mon - Fri 8 AM - 5 PM  [facility] [place]  MWF 12:30 to 3:00 or call to schedule an appointment  [phone]  Specific Provider options Psychology Today  [email or web address] click on find a therapist  enter your zip code left side and select or tailor a therapist for your specific need.   [facility] Provider Directory [email or web address]   (Medicaid)   Follow all drop down to find a provider  [place] or [email or web address] [address], [place], [place] Recovery support and educational   24- Hour Availability:   [facility]  [address] [place], [place] Crisis [phone]  [facility] the [employer] [phone]  [place]  [phone]   [place]  [phone] (after hours)  Therapeutic Alternative/Mobile Crisis   [phone]  [place] National Suicide Hotline  [phone] Diamantina Monks)  Call 911 or go to emergency room  [facility]  [phone]);  [place] and [employer]  [phone]); [place], [place], [place], [place], [place], [place], [place]

## 2022-12-04 DIAGNOSIS — F209 Schizophrenia, unspecified: Secondary | ICD-10-CM | POA: Insufficient documentation

## 2022-12-04 NOTE — Assessment & Plan Note (Signed)
Patient reported history of schizophrenia.  She does have disorganized thought and flight of ideas and has reported tactile hallucinations last visit.  Though PHQ-9 had positive question 9 she does not have active plan or thoughts today of SI.  Psychiatry referral already made last visit and patient is amenable to visit.  Believe that psychiatry would be very beneficial for this patient to gain more organization in her life. - Offered phone number to call psychiatry office to reschedule appointment - Offered counseling services covered by Medicaid -Follow-up in 2 months

## 2022-12-04 NOTE — Assessment & Plan Note (Addendum)
Patient was amenable to trying skin care plus pharmacologic treatment before dermatology referral. - Gentle cleanser with sunscreen in the morning, at night cleanse with gentle cleanser apply unscented moisturizer and tretinoin - Pregnancy test today and starting Depo and prescribing 0.05% tretinoin - Follow-up in 2 months

## 2022-12-05 ENCOUNTER — Encounter: Payer: Self-pay | Admitting: Internal Medicine

## 2022-12-05 ENCOUNTER — Ambulatory Visit (INDEPENDENT_AMBULATORY_CARE_PROVIDER_SITE_OTHER): Payer: BLUE CROSS/BLUE SHIELD | Admitting: Internal Medicine

## 2022-12-05 ENCOUNTER — Other Ambulatory Visit: Payer: Self-pay

## 2022-12-05 VITALS — BP 117/77 | HR 83 | Resp 16 | Ht 64.0 in | Wt 155.6 lb

## 2022-12-05 DIAGNOSIS — L0232 Furuncle of buttock: Secondary | ICD-10-CM

## 2022-12-05 DIAGNOSIS — Z79899 Other long term (current) drug therapy: Secondary | ICD-10-CM | POA: Diagnosis not present

## 2022-12-05 DIAGNOSIS — S31829D Unspecified open wound of left buttock, subsequent encounter: Secondary | ICD-10-CM

## 2022-12-05 DIAGNOSIS — B20 Human immunodeficiency virus [HIV] disease: Secondary | ICD-10-CM | POA: Diagnosis not present

## 2022-12-05 NOTE — Progress Notes (Signed)
RFV: follow up for hiv disease  Patient ID: Dawn CashingLatisha Haroon, female   DOB: 12/02/1970, 52 y.o.   MRN: [id number]  HPI Debbe OdeaLatisha is a 52yo F with wel controlled hiv disease, descovy-DRVc.who reports good adherence to hiv meds.  Continues to get depo injection and pap smear. Getting into care.  Excited to be a godmother.  Outpatient Encounter Medications as of 12/05/2022  Medication Sig   albuterol (VENTOLIN HFA) 108 (90 Base) MCG/ACT inhaler Inhale 1 puff into the lungs every 6 (six) hours as needed for wheezing or shortness of breath.   darunavir-cobicistat (PREZCOBIX) 800-150 MG tablet TAKE ONE TABLET BY MOUTH DAILY WITH BREAKFAST *SWALLOW WHOLE. DO NOT CRUSH, BREAK OR CHEW TABLETS*   dicyclomine (BENTYL) 20 MG tablet Take 1 tablet (20 mg total) by mouth 2 (two) times daily.   emtricitabine-tenofovir AF (DESCOVY) 200-25 MG tablet Take 1 tablet by mouth daily.   erythromycin ophthalmic ointment Place a 1/2 inch ribbon of ointment into the lower eyelid.   ferrous sulfate 325 (65 FE) MG tablet Take 1 tablet (325 mg total) by mouth daily.   FLUoxetine (PROZAC) 20 MG capsule Take 1 capsule (20 mg total) by mouth daily.   fluticasone (FLONASE) 50 MCG/ACT nasal spray Place 1 spray into both nostrils daily for 5 days.   hydrochlorothiazide (HYDRODIURIL) 25 MG tablet Take 0.5 tablets (12.5 mg total) by mouth daily.   meloxicam (MOBIC) 7.5 MG tablet Take 7.5 mg by mouth daily.   rosuvastatin (CRESTOR) 10 MG tablet Take 1 tablet (10 mg total) by mouth daily.   tretinoin (RETIN-A) 0.05 % cream Apply topically at bedtime.   valACYclovir (VALTREX) 1000 MG tablet TAKE ONE TABLET BY MOUTH DAILY   [DISCONTINUED] diphenhydrAMINE (BENADRYL) 25 MG tablet Take 1 tablet (25 mg total) by mouth every 6 (six) hours as needed for up to 3 days.   [DISCONTINUED] famotidine (PEPCID) 20 MG tablet Take 1 tablet (20 mg total) by mouth 2 (two) times daily for 5 days.   No facility-administered encounter medications on  file as of 12/05/2022.     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   Vaginal discharge 08/14/2022   Enlarged thyroid gland 01/30/2022   Fibroids 07/12/2021   Need for prophylactic vaccination and inoculation against influenza 07/12/2021   Viral illness 01/08/2019   HIV disease 03/05/2017   HTN (hypertension) 03/05/2017   Anemia due to chronic blood loss 03/13/2016   Acquired immunodeficiency syndrome 12/07/2015   Acne vulgaris 12/07/2015   Episodic mood disorder 12/07/2015     Health Maintenance Due  Topic Date Due   COLONOSCOPY (Pts [phone] Insurance coverage will need to be confirmed)  Never done   COVID-19 Vaccine (3 - Pfizer risk series) 07/20/2022     Review of Systems Review of Systems  Constitutional: Negative for fever, chills, diaphoresis, activity change, appetite change, fatigue and unexpected weight change.  HENT: Negative for congestion, sore throat, rhinorrhea, sneezing, trouble swallowing and sinus pressure.  Eyes: Negative for photophobia and visual disturbance.  Respiratory: Negative for cough, chest tightness, shortness of breath, wheezing and stridor.  Cardiovascular: Negative for chest pain, palpitations and leg swelling.  Gastrointestinal: Negative for nausea, vomiting, abdominal pain, diarrhea, constipation, blood in stool, abdominal distention and anal bleeding.  Genitourinary: Negative for dysuria, hematuria, flank pain and difficulty urinating.  Musculoskeletal: Negative for myalgias, back pain, joint swelling, arthralgias and gait problem.  Skin: Negative for  color change, pallor, rash and wound.  Neurological: Negative for dizziness, tremors, weakness and light-headedness.  Hematological: Negative for adenopathy. Does not bruise/bleed easily.  Psychiatric/Behavioral: Negative for behavioral problems, confusion, sleep disturbance, dysphoric mood,  decreased concentration and agitation.   Physical Exam   BP 117/77   Pulse 83   Resp 16   Ht 5\' 4"  (1.626 m)   Wt 155 lb 9.6 oz (70.6 kg)   LMP 11/08/2022 Comment: depo  SpO2 100%   BMI 26.71 kg/m   Physical Exam  Constitutional:  oriented to person, place, and time. appears well-developed and well-nourished. No distress.  HENT: Andrew/AT, PERRLA, no scleral icterus Mouth/Throat: Oropharynx is clear and moist. No oropharyngeal exudate.  Cardiovascular: Normal rate, regular rhythm and normal heart sounds. Exam reveals no gallop and no friction rub.  No murmur heard.  Pulmonary/Chest: Effort normal and breath sounds normal. No respiratory distress.  has no wheezes.  Neck = supple, no nuchal rigidity Abdominal: Soft. Bowel sounds are normal.  exhibits no distension. There is no tenderness.  Lymphadenopathy: no cervical adenopathy. No axillary adenopathy Neurological: alert and oriented to person, place, and time.  Skin: Skin is warm and dry. No rash noted. No erythema.  Psychiatric: a normal mood and affect.  behavior is normal.   Lab Results  Component Value Date   CD4TCELL 49 10/05/2022   Lab Results  Component Value Date   CD4TABS 488 10/05/2022   CD4TABS 565 04/17/2022   CD4TABS 746 07/12/2021   Lab Results  Component Value Date   HIV1RNAQUANT <20 (H) 10/05/2022   Lab Results  Component Value Date   HEPBSAB POS (A) 12/09/2015   Lab Results  Component Value Date   LABRPR NON-REACTIVE 10/05/2022    CBC Lab Results  Component Value Date   WBC 3.4 (L) 10/05/2022   RBC 3.90 10/05/2022   HGB 10.0 (L) 10/05/2022   HCT 32.0 (L) 10/05/2022   PLT 316 10/05/2022   MCV 82.1 10/05/2022   MCH 25.6 (L) 10/05/2022   MCHC 31.3 (L) 10/05/2022   RDW 14.4 10/05/2022   LYMPHSABS 1,251 10/05/2022   MONOABS 0.4 06/28/2022   EOSABS 41 10/05/2022    BMET Lab Results  Component Value Date   NA 141 10/05/2022   K 3.6 10/05/2022   CL 105 10/05/2022   CO2 26 10/05/2022    GLUCOSE 83 10/05/2022   BUN 14 10/05/2022   CREATININE 1.10 (H) 10/05/2022   CALCIUM 9.4 10/05/2022   GFRNONAA >60 06/28/2022   GFRAA 87 02/02/2021      Assessment and Plan  Hiv disease = will plan to check CD 4 count and VL in Feb 2024 was well controlled  Long term medication management = cr is at baseline  Health prevention = started on rosuvastatin for primary prevention/reprieve trial at last visit; she is tolerating  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 No need for abtx or I x D at this time.  Hx of hsv = continue on daily proph with  valtrex 1000mg  daily

## 2022-12-06 LAB — T-HELPER CELL (CD4) - (RCID CLINIC ONLY)
CD4 % Helper T Cell: 46 % (ref 33–65)
CD4 T Cell Abs: 515 /uL (ref 400–1790)

## 2022-12-07 ENCOUNTER — Telehealth: Payer: Self-pay

## 2022-12-07 NOTE — Telephone Encounter (Signed)
A Prior Authorization was initiated for this patients TRETINOIN 0.05% CREAM through CoverMyMeds.   Key: [facility]

## 2022-12-08 NOTE — Telephone Encounter (Signed)
Prior Auth for patients medication TRETINOIN CREAM approved by [employer] from 12/07/22 to 12/07/23.  CoverMyMeds Key: [facility] Case ID #: [id number]

## 2022-12-14 ENCOUNTER — Other Ambulatory Visit (HOSPITAL_BASED_OUTPATIENT_CLINIC_OR_DEPARTMENT_OTHER): Payer: Self-pay

## 2022-12-14 ENCOUNTER — Emergency Department (HOSPITAL_BASED_OUTPATIENT_CLINIC_OR_DEPARTMENT_OTHER)
Admission: EM | Admit: 2022-12-14 | Discharge: 2022-12-14 | Disposition: A | Discharge status: Home / Self Care | Payer: BLUE CROSS/BLUE SHIELD | Attending: Emergency Medicine | Admitting: Emergency Medicine

## 2022-12-14 ENCOUNTER — Other Ambulatory Visit: Payer: Self-pay

## 2022-12-14 ENCOUNTER — Encounter (HOSPITAL_BASED_OUTPATIENT_CLINIC_OR_DEPARTMENT_OTHER): Payer: Self-pay | Admitting: Emergency Medicine

## 2022-12-14 DIAGNOSIS — Z21 Asymptomatic human immunodeficiency virus [HIV] infection status: Secondary | ICD-10-CM | POA: Diagnosis not present

## 2022-12-14 DIAGNOSIS — R109 Unspecified abdominal pain: Secondary | ICD-10-CM | POA: Diagnosis not present

## 2022-12-14 DIAGNOSIS — R112 Nausea with vomiting, unspecified: Secondary | ICD-10-CM | POA: Diagnosis not present

## 2022-12-14 DIAGNOSIS — I10 Essential (primary) hypertension: Secondary | ICD-10-CM | POA: Insufficient documentation

## 2022-12-14 DIAGNOSIS — Z79899 Other long term (current) drug therapy: Secondary | ICD-10-CM | POA: Insufficient documentation

## 2022-12-14 DIAGNOSIS — R197 Diarrhea, unspecified: Secondary | ICD-10-CM | POA: Diagnosis not present

## 2022-12-14 LAB — CBC WITH DIFFERENTIAL/PLATELET
Abs Immature Granulocytes: 0.01 10*3/uL (ref 0.00–0.07)
Basophils Absolute: 0 10*3/uL (ref 0.0–0.1)
Basophils Relative: 1 %
Eosinophils Absolute: 0 10*3/uL (ref 0.0–0.5)
Eosinophils Relative: 1 %
HCT: 33.5 % — ABNORMAL LOW (ref 36.0–46.0)
Hemoglobin: 10.7 g/dL — ABNORMAL LOW (ref 12.0–15.0)
Immature Granulocytes: 0 %
Lymphocytes Relative: 43 %
Lymphs Abs: 1.4 10*3/uL (ref 0.7–4.0)
MCH: 26.1 pg (ref 26.0–34.0)
MCHC: 31.9 g/dL (ref 30.0–36.0)
MCV: 81.7 fL (ref 80.0–100.0)
Monocytes Absolute: 0.3 10*3/uL (ref 0.1–1.0)
Monocytes Relative: 8 %
Neutro Abs: 1.5 10*3/uL — ABNORMAL LOW (ref 1.7–7.7)
Neutrophils Relative %: 47 %
Platelets: 289 10*3/uL (ref 150–400)
RBC: 4.1 MIL/uL (ref 3.87–5.11)
RDW: 18.1 % — ABNORMAL HIGH (ref 11.5–15.5)
WBC: 3.2 10*3/uL — ABNORMAL LOW (ref 4.0–10.5)
nRBC: 0 % (ref 0.0–0.2)

## 2022-12-14 LAB — PREGNANCY, URINE: Preg Test, Ur: NEGATIVE

## 2022-12-14 LAB — URINALYSIS, ROUTINE W REFLEX MICROSCOPIC
Bacteria, UA: NONE SEEN
Bilirubin Urine: NEGATIVE
Glucose, UA: NEGATIVE mg/dL
Ketones, ur: NEGATIVE mg/dL
Leukocytes,Ua: NEGATIVE
Nitrite: NEGATIVE
Protein, ur: NEGATIVE mg/dL
Specific Gravity, Urine: 1.012 (ref 1.005–1.030)
pH: 7.5 (ref 5.0–8.0)

## 2022-12-14 LAB — COMPREHENSIVE METABOLIC PANEL
ALT: 10 U/L (ref 0–44)
AST: 16 U/L (ref 15–41)
Albumin: 4.2 g/dL (ref 3.5–5.0)
Alkaline Phosphatase: 47 U/L (ref 38–126)
Anion gap: 8 (ref 5–15)
BUN: 14 mg/dL (ref 6–20)
CO2: 27 mmol/L (ref 22–32)
Calcium: 9.4 mg/dL (ref 8.9–10.3)
Chloride: 103 mmol/L (ref 98–111)
Creatinine, Ser: 0.93 mg/dL (ref 0.44–1.00)
GFR, Estimated: >60 mL/min (ref >60)
Glucose, Bld: 87 mg/dL (ref 70–99)
Potassium: 3.6 mmol/L (ref 3.5–5.1)
Sodium: 138 mmol/L (ref 135–145)
Total Bilirubin: 0.5 mg/dL (ref 0.3–1.2)
Total Protein: 8 g/dL (ref 6.5–8.1)

## 2022-12-14 LAB — LIPASE, BLOOD: Lipase: 48 U/L (ref 11–51)

## 2022-12-14 LAB — LACTIC ACID, PLASMA: Lactic Acid, Venous: 0.5 mmol/L (ref 0.5–1.9)

## 2022-12-14 MED ORDER — ONDANSETRON HCL 4 MG/2ML IJ SOLN
4.0000 mg | Freq: Once | INTRAMUSCULAR | Status: AC
  Administered 2022-12-14: 4 mg via INTRAVENOUS
  Filled 2022-12-14: qty 2

## 2022-12-14 MED ORDER — ONDANSETRON 4 MG PO TBDP
4.0000 mg | ORAL_TABLET | Freq: Three times a day (TID) | ORAL | 0 refills | Status: DC | PRN
  Filled 2022-12-14: qty 20, 7d supply, fill #0

## 2022-12-14 MED ORDER — SODIUM CHLORIDE 0.9 % IV BOLUS
1000.0000 mL | Freq: Once | INTRAVENOUS | Status: AC
  Administered 2022-12-14: 1000 mL via INTRAVENOUS

## 2022-12-14 NOTE — ED Notes (Signed)
Dc instructions reviewed with patient. Patient voiced understanding. Dc with belongings.  °

## 2022-12-14 NOTE — ED Provider Notes (Signed)
[place] EMERGENCY DEPARTMENT AT [facility] Provider Note   CSN: [id number] Arrival date & time: 12/14/22  1227     History  Chief Complaint  Patient presents with   Diarrhea    Dawn [name] is a 52 y.o. female.  The history is provided by the patient and medical records. No language interpreter was used.  Diarrhea Quality:  Watery Severity:  Moderate Onset quality:  Gradual Duration:  2 days Timing:  Intermittent Progression:  Unchanged Relieved by:  Nothing Worsened by:  Nothing Ineffective treatments:  None tried Associated symptoms: vomiting   Associated symptoms: no abdominal pain, no chills, no recent cough, no diaphoresis, no fever, no headaches and no URI   Vomiting:    Quality:  Stomach contents Risk factors: suspect food intake        Home Medications Prior to Admission medications   Medication Sig Start Date End Date Taking? Authorizing Provider  albuterol (VENTOLIN HFA) 108 (90 Base) MCG/ACT inhaler Inhale 1 puff into the lungs every 6 (six) hours as needed for wheezing or shortness of breath. 06/14/21   Lamptey, Britta Mccreedy, MD  darunavir-cobicistat (PREZCOBIX) 800-150 MG tablet TAKE ONE TABLET BY MOUTH DAILY WITH BREAKFAST *SWALLOW WHOLE. DO NOT CRUSH, BREAK OR CHEW TABLETS* 10/05/22   Judyann Munson, MD  dicyclomine (BENTYL) 20 MG tablet Take 1 tablet (20 mg total) by mouth 2 (two) times daily. Patient not taking: Reported on 12/05/2022 08/17/22   Raspet, Noberto Retort, PA-C  emtricitabine-tenofovir AF (DESCOVY) 200-25 MG tablet Take 1 tablet by mouth daily. 10/05/22   Judyann Munson, MD  erythromycin ophthalmic ointment Place a 1/2 inch ribbon of ointment into the lower eyelid. 11/16/22   Garrison, [place] N, FNP  ferrous sulfate 325 (65 FE) MG tablet Take 1 tablet (325 mg total) by mouth daily. 09/04/20   Petrucelli, Samantha R, PA-C  FLUoxetine (PROZAC) 20 MG capsule Take 1 capsule (20 mg total) by mouth daily. 04/17/22   Judyann Munson, MD  fluticasone  (FLONASE) 50 MCG/ACT nasal spray Place 1 spray into both nostrils daily for 5 days. 11/16/22 12/05/22  Garrison, [place] N, FNP  hydrochlorothiazide (HYDRODIURIL) 25 MG tablet Take 0.5 tablets (12.5 mg total) by mouth daily. 06/22/22   Judyann Munson, MD  meloxicam (MOBIC) 7.5 MG tablet Take 7.5 mg by mouth daily. 09/19/22   [provider]  rosuvastatin (CRESTOR) 10 MG tablet Take 1 tablet (10 mg total) by mouth daily. 10/05/22   Judyann Munson, MD  tretinoin (RETIN-A) 0.05 % cream Apply topically at bedtime. 11/30/22   Lockie Mola, MD  valACYclovir (VALTREX) 1000 MG tablet TAKE ONE TABLET BY MOUTH DAILY 10/31/22   Judyann Munson, MD  diphenhydrAMINE (BENADRYL) 25 MG tablet Take 1 tablet (25 mg total) by mouth every 6 (six) hours as needed for up to 3 days. 06/17/20 09/04/20  Orvil Feil, PA-C  famotidine (PEPCID) 20 MG tablet Take 1 tablet (20 mg total) by mouth 2 (two) times daily for 5 days. 06/17/20 09/04/20  Orvil Feil, PA-C      Allergies    Chocolate and Tomato    Review of Systems   Review of Systems  Constitutional:  Negative for chills, diaphoresis, fatigue and fever.  HENT:  Negative for congestion.   Respiratory:  Negative for cough, chest tightness, shortness of breath and wheezing.   Gastrointestinal:  Positive for diarrhea, nausea and vomiting. Negative for abdominal pain.  Genitourinary:  Negative for dysuria, flank pain and frequency.  Musculoskeletal:  Negative for  back pain, neck pain and neck stiffness.  Skin:  Negative for rash and wound.  Neurological:  Positive for light-headedness. Negative for dizziness, seizures, syncope, weakness, numbness and headaches.  Psychiatric/Behavioral:  Negative for agitation and confusion.   All other systems reviewed and are negative.   Physical Exam Updated Vital Signs BP (!) 154/98 (BP Location: Right Arm)   Pulse 93   Temp 99 F (37.2 C) (Oral)   Resp 20   Ht 5\' 4"  (1.626 m)   Wt 73.5 kg   LMP 11/08/2022  Comment: depo  SpO2 100%   BMI 27.81 kg/m  Physical Exam Vitals and nursing note reviewed.  Constitutional:      General: She is not in acute distress.    Appearance: She is well-developed. She is not ill-appearing, toxic-appearing or diaphoretic.  HENT:     Head: Normocephalic and atraumatic.     Nose: No congestion or rhinorrhea.     Mouth/Throat:     Mouth: Mucous membranes are dry.     Pharynx: No oropharyngeal exudate or posterior oropharyngeal erythema.  Eyes:     Extraocular Movements: Extraocular movements intact.     Conjunctiva/sclera: Conjunctivae normal.     Pupils: Pupils are equal, round, and reactive to light.  Cardiovascular:     Rate and Rhythm: Normal rate and regular rhythm.     Heart sounds: No murmur heard. Pulmonary:     Effort: Pulmonary effort is normal. No respiratory distress.     Breath sounds: Normal breath sounds. No wheezing, rhonchi or rales.  Chest:     Chest wall: No tenderness.  Abdominal:     General: Abdomen is flat.     Palpations: Abdomen is soft.     Tenderness: There is no abdominal tenderness. There is no guarding or rebound.  Musculoskeletal:        General: No swelling or tenderness.     Cervical back: Neck supple. No tenderness.     Right lower leg: No edema.     Left lower leg: No edema.  Skin:    General: Skin is warm and dry.     Capillary Refill: Capillary refill takes less than 2 seconds.     Findings: No erythema or rash.  Neurological:     General: No focal deficit present.     Mental Status: She is alert.  Psychiatric:        Mood and Affect: Mood normal.     ED Results / Procedures / Treatments   Labs (all labs ordered are listed, but only abnormal results are displayed) Labs Reviewed  CBC WITH DIFFERENTIAL/PLATELET - Abnormal; Notable for the following components:      Result Value   WBC 3.2 (*)    Hemoglobin 10.7 (*)    HCT 33.5 (*)    RDW 18.1 (*)    Neutro Abs 1.5 (*)    All other components within  normal limits  URINALYSIS, ROUTINE W REFLEX MICROSCOPIC - Abnormal; Notable for the following components:   Color, Urine COLORLESS (*)    Hgb urine dipstick MODERATE (*)    All other components within normal limits  COMPREHENSIVE METABOLIC PANEL  LIPASE, BLOOD  LACTIC ACID, PLASMA  PREGNANCY, URINE  LACTIC ACID, PLASMA    EKG None  Radiology No results found.  Procedures Procedures    Medications Ordered in ED Medications  sodium chloride 0.9 % bolus 1,000 mL (1,000 mLs Intravenous New Bag/Given 12/14/22 1402)  ondansetron (ZOFRAN) injection 4 mg (4  mg Intravenous Given 12/14/22 1410)    ED Course/ Medical Decision Making/ A&P                             Medical Decision Making Amount and/or Complexity of Data Reviewed Labs: ordered.  Risk Prescription drug management.    Dawn [name] is a 52 y.o. female with a past medical history significant for HIV, hypertension, fibroids, and chronic anemia who presents with diarrhea.  Patient reports that since last night she has had nausea, vomiting, and diarrhea.  She reports that she was unsure if she was around anybody with recent GI bug but after having a salad and a shake she started having nausea and vomiting.  She reports just abdominal cramping with the diarrhea that has been loose.  She denies any blood in her stool or emesis.  She reports abdominal cramping has improved but she still having the loose stools.  She denies any fevers, chills, congestion, or cough.  Denies any urinary changes.  She reports feeling lightheaded and fatigued but otherwise has no complaints.  On exam, lungs were clear.  Chest was nontender.  Abdomen nontender on my exam.  Good pulses in extremities.  Mucous membranes are dry.  Patient otherwise well-appearing.  Clinically I suspect she has a viral gastroenteritis given the nausea vomiting, diarrhea symptoms.  We will check some labs and give her some fluids and nausea medicine.  Anticipate  reassessment.  Given the lack of abdominal tenderness, she agrees to hold on imaging at this time.  After fluid she is feeling much better.  Nausea has improved after the medication and she is able to eat and drink.  We reassessed and as she is feeling much better, we will hold on CT imaging or further workup at this time.  Labs overall reassuring.  Patient is feeling better so we will discharge with plans to follow-up with PCP and she was given prescription for nausea medication.  She understands return precautions and follow-up instructions and was discharged in good condition after rehydration.         Final Clinical Impression(s) / ED Diagnoses Final diagnoses:  Nausea vomiting and diarrhea  Abdominal cramping    Rx / DC Orders ED Discharge Orders          Ordered    ondansetron (ZOFRAN-ODT) 4 MG disintegrating tablet  Every 8 hours PRN        12/14/22 1520           Clinical Impression: 1. Nausea vomiting and diarrhea   2. Abdominal cramping     Disposition: Discharge  Condition: Good  I have discussed the results, Dx and Tx plan with the pt(& family if present). He/she/they expressed understanding and agree(s) with the plan. Discharge instructions discussed at great length. Strict return precautions discussed and pt &/or family have verbalized understanding of the instructions. No further questions at time of discharge.    Discharge Medication List as of 12/14/2022  3:21 PM     START taking these medications   Details  ondansetron (ZOFRAN-ODT) 4 MG disintegrating tablet Take 1 tablet (4 mg total) by mouth every 8 (eight) hours as needed for nausea or vomiting., Starting Thu 12/14/2022, Print        Follow Up: [facility] [address] [place] [address] [phone]     [facility] Emergency Department at [facility] [address] [place] [address] [phone]  Alphonza Tramell, Canary Brim,  MD 12/14/22 [phone]

## 2022-12-14 NOTE — ED Notes (Signed)
Cab voucher given to patient.

## 2022-12-14 NOTE — ED Notes (Signed)
Pt awaiting prescription

## 2022-12-14 NOTE — Discharge Instructions (Signed)
Your history, exam, evaluation today are suggestive of a viral gastroenteritis or GI bug causing nausea, vomiting, diarrhea, and abdominal cramping.  Your labs were overall reassuring and you appeared better with fluids and nausea medicine.  We together agree to hold on more extensive imaging or workup today given your well appearance and your reassuring vital signs on reassessment.  We feel you are safe for discharge home with prescription for nausea medicine and instructions to rest and follow-up with your primary doctor.  Please stay hydrated.  If any symptoms change or worsen acutely, please return to the nearest emergency department.

## 2022-12-14 NOTE — ED Triage Notes (Signed)
Pt arrived from [facility]. Pt does have HIV, stomach cramping last night after strawberry/blueberry shake and salad. Pt started diarrhea this am, pt able to drink. Pt does have some nausea, more tired than usual

## 2022-12-18 ENCOUNTER — Other Ambulatory Visit (HOSPITAL_COMMUNITY): Payer: Self-pay

## 2022-12-18 ENCOUNTER — Telehealth: Payer: Self-pay

## 2022-12-18 ENCOUNTER — Other Ambulatory Visit: Payer: Self-pay

## 2022-12-18 ENCOUNTER — Other Ambulatory Visit: Payer: Self-pay | Admitting: Internal Medicine

## 2022-12-18 DIAGNOSIS — B009 Herpesviral infection, unspecified: Secondary | ICD-10-CM

## 2022-12-18 NOTE — Telephone Encounter (Signed)
Patient called office today stating she did not receive all her medication from [facility]. Is waiting on Valtrex refill. States she is not going to take her medication until she receives this because it is not a complete regimen. Explained to patient that she is on Prezcobix and Descovy which is her complete HIV regimen. Explained Valtrex is for herpes outbreaks. Advised she go ahead and take her medicine  today and avoid missing doses.  Patient verbalized understanding. Is requesting medication be switched to a [facility] pharmacy. Will have Carylon Perches tech check if this can be done. Juanita Laster, RMA

## 2023-01-23 ENCOUNTER — Encounter (HOSPITAL_COMMUNITY): Payer: Self-pay | Admitting: *Deleted

## 2023-01-23 ENCOUNTER — Ambulatory Visit (HOSPITAL_COMMUNITY)
Admission: EM | Admit: 2023-01-23 | Discharge: 2023-01-23 | Disposition: A | Discharge status: Home / Self Care | Payer: 59

## 2023-01-23 DIAGNOSIS — S1096XA Insect bite of unspecified part of neck, initial encounter: Secondary | ICD-10-CM | POA: Diagnosis not present

## 2023-01-23 DIAGNOSIS — R109 Unspecified abdominal pain: Secondary | ICD-10-CM | POA: Diagnosis not present

## 2023-01-23 DIAGNOSIS — L089 Local infection of the skin and subcutaneous tissue, unspecified: Secondary | ICD-10-CM

## 2023-01-23 DIAGNOSIS — W57XXXA Bitten or stung by nonvenomous insect and other nonvenomous arthropods, initial encounter: Secondary | ICD-10-CM | POA: Diagnosis not present

## 2023-01-23 LAB — POCT URINALYSIS DIP (MANUAL ENTRY)
Bilirubin, UA: NEGATIVE
Blood, UA: NEGATIVE
Glucose, UA: NEGATIVE mg/dL
Ketones, POC UA: NEGATIVE mg/dL
Leukocytes, UA: NEGATIVE
Nitrite, UA: NEGATIVE
Protein Ur, POC: NEGATIVE mg/dL
Spec Grav, UA: 1.025 (ref 1.010–1.025)
Urobilinogen, UA: 0.2 E.U./dL
pH, UA: 6.5 (ref 5.0–8.0)

## 2023-01-23 LAB — POCT URINE PREGNANCY: Preg Test, Ur: NEGATIVE

## 2023-01-23 MED ORDER — TRIAMCINOLONE ACETONIDE 0.025 % EX OINT: 1.0000 | TOPICAL_OINTMENT | Freq: Two times a day (BID) | CUTANEOUS | 0 refills | Status: DC | PRN

## 2023-01-23 MED ORDER — CEPHALEXIN 500 MG PO CAPS: 500.0000 mg | ORAL_CAPSULE | Freq: Two times a day (BID) | ORAL | 0 refills | Status: AC

## 2023-01-23 NOTE — ED Provider Notes (Signed)
[facility]    CSN: [id number] Arrival date & time: 01/23/23  1406      History   Chief Complaint Chief Complaint  Patient presents with   Insect Bite   Flank Pain    HPI Dawn [name] is a 52 y.o. female.   HPI Patient since today with a low-grade temp and a concern for an infected insect bite on the right side of her neck.  She reports insect bite occurred around 01/20/2023.  It is itchy and it burns.  It has not drained any drainage.  She reports there is multiple insects in her apartment and she has reported to her landlord and also to treat on her home with foggers. Uncertain of  what type of insect bit her.  Along with experiencing flank pain x 3 days. She is immunocompromise as she has not HIV. Flank pain is present on the right side x one day. 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   Vaginal discharge 08/14/2022   Enlarged thyroid gland 01/30/2022   Fibroids 07/12/2021   Need for prophylactic vaccination and inoculation against influenza 07/12/2021   Viral illness 01/08/2019   HIV disease (HCC) 03/05/2017   HTN (hypertension) 03/05/2017   Anemia 03/13/2016   Anemia due to chronic blood loss 03/13/2016   Acquired immune deficiency syndrome (HCC) 12/07/2015   Acne vulgaris 12/07/2015   Mood disorder (HCC) 12/07/2015    Past Surgical History:  Procedure Laterality Date   CESAREAN SECTION     DILATION AND CURETTAGE OF UTERUS     HAND TENDON SURGERY     right thumb   INDUCED ABORTION     TUBAL LIGATION      OB History     Gravida  6   Para  4   Term  4   Preterm      AB  2   Living  4       SAB  1   IAB  1   Ectopic      Multiple      Live Births  4            Home Medications    Prior to Admission medications   Medication Sig Start Date End Date Taking? Authorizing Provider  BIKTARVY 50-200-25 MG TABS tablet Take 1 tablet by mouth daily. 12/18/22  Yes [provider]  cephALEXin (KEFLEX) 500 MG capsule Take 1 capsule (500 mg total) by mouth 2 (two) times daily for 5 days. 01/23/23 01/28/23 Yes Bing Neighbors, NP  darunavir-cobicistat (PREZCOBIX) 800-150 MG tablet TAKE ONE TABLET BY MOUTH DAILY WITH BREAKFAST *SWALLOW WHOLE. DO NOT CRUSH, BREAK OR CHEW TABLETS* 10/05/22  Yes Judyann Munson, MD  emtricitabine-tenofovir AF (DESCOVY) 200-25 MG tablet Take 1 tablet by mouth daily. 10/05/22  Yes Judyann Munson, MD  hydrochlorothiazide (HYDRODIURIL) 25 MG tablet Take 0.5 tablets (12.5 mg total) by mouth daily. 06/22/22  Yes Judyann Munson, MD  triamcinolone (KENALOG) 0.025 % ointment Apply 1 Application topically 2 (two) times daily as needed (apply to affected area). 01/23/23  Yes Bing Neighbors, NP  albuterol (VENTOLIN HFA) 108 (90 Base) MCG/ACT inhaler Inhale 1 puff into the lungs every 6 (six) hours as needed for wheezing or shortness of breath. 06/14/21   Lamptey, Britta Mccreedy, MD  dicyclomine (BENTYL) 20 MG tablet Take 1 tablet (20 mg total) by mouth 2 (two) times daily. Patient not taking: Reported on 12/05/2022 08/17/22   Raspet, Noberto Retort, PA-C  diphenhydrAMINE-zinc acetate (BENADRYL EXTRA STRENGTH) cream Apply 1 Application topically 3 (three) times daily as needed for itching. 01/25/23   Dartha Lodge, PA-C  erythromycin ophthalmic ointment Place a 1/2 inch ribbon of ointment into the lower eyelid. 11/16/22   Garrison, [place] N, FNP  ferrous sulfate 325 (65 FE) MG tablet Take 1 tablet (325 mg total) by mouth daily. 09/04/20   Petrucelli, Samantha R, PA-C  FLUoxetine (PROZAC) 20 MG capsule Take 1 capsule (20 mg total) by mouth daily. 04/17/22   Judyann Munson,  MD  fluticasone (FLONASE) 50 MCG/ACT nasal spray Place 1 spray into both nostrils daily for 5 days. 11/16/22 12/05/22  Garrison, [place] N, FNP  meloxicam (MOBIC) 7.5 MG tablet Take 7.5 mg by mouth daily. 09/19/22   [provider]  ondansetron (ZOFRAN-ODT) 4 MG disintegrating tablet Dissolve 1 tablet under the tongue every 8 (eight) hours as needed for nausea or vomiting. 12/14/22   Tegeler, Canary Brim, MD  rosuvastatin (CRESTOR) 10 MG tablet Take 1 tablet (10 mg total) by mouth daily. 10/05/22   Judyann Munson, MD  tretinoin (RETIN-A) 0.05 % cream Apply topically at bedtime. 11/30/22   Lockie Mola, MD  valACYclovir (VALTREX) 1000 MG tablet TAKE ONE TABLET BY MOUTH DAILY 12/18/22   Judyann Munson, MD  diphenhydrAMINE (BENADRYL) 25 MG tablet Take 1 tablet (25 mg total) by mouth every 6 (six) hours as needed for up to 3 days. 06/17/20 09/04/20  Orvil Feil, PA-C  famotidine (PEPCID) 20 MG tablet Take 1 tablet (20 mg total) by mouth 2 (two) times daily for 5 days. 06/17/20 09/04/20  Orvil Feil, PA-C    Family History Family History  Problem Relation Age of Onset   Breast cancer Neg Hx     Social History Social History   Tobacco Use   Smoking status: Former    Types: Cigarettes    Passive exposure: Never   Smokeless tobacco: Never  Vaping Use   Vaping Use: Never used  Substance Use Topics   Alcohol use: Yes    Comment: hx of alcohol abuse- social drinker now   Drug use: Not Currently    Types: Marijuana    Comment: none in 8 yrs     Allergies   Chocolate and Tomato   Review of Systems Review of Systems Pertinent negatives listed in HPI  Physical Exam Triage Vital Signs ED Triage Vitals  Enc Vitals Group     BP 01/23/23 1456 (!) 140/88     Pulse Rate 01/23/23 1456 87     Resp 01/23/23 1456 18     Temp 01/23/23 1456 99 F (37.2 C)     Temp Source 01/23/23 1456 Oral     SpO2 01/23/23 1456 98 %     Weight --      Height --      Head Circumference --       Peak Flow --      Pain Score 01/23/23 1453 8     Pain Loc --      Pain Edu? --      Excl. in GC? --  No data found.  Updated Vital Signs BP (!) 140/88 (BP Location: Right Arm)   Pulse 87   Temp 99 F (37.2 C) (Oral)   Resp 18   LMP 01/19/2023 (Exact Date)   SpO2 98%   Visual Acuity Right Eye Distance:   Left Eye Distance:   Bilateral Distance:    Right Eye Near:   Left Eye Near:    Bilateral Near:     Physical Exam Vitals reviewed.  Constitutional:      Appearance: Normal appearance.  HENT:     Head: Normocephalic and atraumatic.  Eyes:     Extraocular Movements: Extraocular movements intact.     Pupils: Pupils are equal, round, and reactive to light.  Cardiovascular:     Rate and Rhythm: Normal rate and regular rhythm.  Pulmonary:     Effort: Pulmonary effort is normal.     Breath sounds: Normal breath sounds.  Abdominal:     Tenderness: There is right CVA tenderness.  Skin:    Capillary Refill: Capillary refill takes less than 2 seconds.     Findings: Erythema present.  Neurological:     General: No focal deficit present.     Mental Status: She is alert.      UC Treatments / Results  Labs (all labs ordered are listed, but only abnormal results are displayed) Labs Reviewed  POCT URINALYSIS DIP (MANUAL ENTRY)  POCT URINE PREGNANCY    EKG   Radiology No results found.  Procedures Procedures (including critical care time)  Medications Ordered in UC Medications - No data to display  Initial Impression / Assessment and Plan / UC Course o  I have reviewed the triage vital signs and the nursing notes.  Pertinent labs & imaging results that were available during my care of the patient were reviewed by me and considered in my medical decision making (see chart for details).  Advised to monitor for signs of infection. Complete entire course of medication  Return as needed if symptoms do not improve. Final Clinical Impressions(s) / UC Diagnoses    Final diagnoses:  Infected insect bite of neck, initial encounter  Abdominal cramping in right flank   Discharge Instructions   None    ED Prescriptions     Medication Sig Dispense Auth. Provider   cephALEXin (KEFLEX) 500 MG capsule Take 1 capsule (500 mg total) by mouth 2 (two) times daily for 5 days. 10 capsule Bing Neighbors, NP   triamcinolone (KENALOG) 0.025 % ointment Apply 1 Application topically 2 (two) times daily as needed (apply to affected area). 160 g Bing Neighbors, NP      PDMP not reviewed this encounter.   Bing Neighbors, NP 01/26/23 1204

## 2023-01-23 NOTE — ED Triage Notes (Signed)
Pt states she was bite by an insect in her apartment on 5/25 and its no better. She did use warm compress without help.    She is also having right flank pain started last night, she took an advil but no relief.

## 2023-01-24 ENCOUNTER — Ambulatory Visit (HOSPITAL_COMMUNITY): Payer: 59 | Admitting: Student

## 2023-01-25 ENCOUNTER — Emergency Department (HOSPITAL_COMMUNITY)
Admission: EM | Admit: 2023-01-25 | Discharge: 2023-01-25 | Disposition: A | Discharge status: Home / Self Care | Payer: 59 | Attending: Emergency Medicine | Admitting: Emergency Medicine

## 2023-01-25 ENCOUNTER — Other Ambulatory Visit: Payer: Self-pay

## 2023-01-25 DIAGNOSIS — S70361A Insect bite (nonvenomous), right thigh, initial encounter: Secondary | ICD-10-CM | POA: Diagnosis present

## 2023-01-25 DIAGNOSIS — W57XXXA Bitten or stung by nonvenomous insect and other nonvenomous arthropods, initial encounter: Secondary | ICD-10-CM | POA: Diagnosis not present

## 2023-01-25 DIAGNOSIS — Z21 Asymptomatic human immunodeficiency virus [HIV] infection status: Secondary | ICD-10-CM | POA: Insufficient documentation

## 2023-01-25 MED ORDER — DIPHENHYDRAMINE-ZINC ACETATE 2-0.1 % EX CREA: 1.0000 | TOPICAL_CREAM | Freq: Three times a day (TID) | CUTANEOUS | 0 refills | Status: DC | PRN

## 2023-01-25 NOTE — Discharge Instructions (Signed)
Insect bite does not appear to have signs of worsening infection.  Use prescribed Benadryl cream to help with itching and try and avoid scratching the area.  Complete antibiotics that were prescribed at urgent care 2 days ago.

## 2023-01-25 NOTE — ED Triage Notes (Signed)
Pt. Stated, I've been bitten by a bug and they were suppose to spray in the apartments I live in. I dont think they have. Its not bed bugs. The bite in on the right thigh

## 2023-01-28 NOTE — ED Provider Notes (Signed)
[place] EMERGENCY DEPARTMENT AT [facility] Provider Note   CSN: [id number] Arrival date & time: 01/25/23  1051     History  Chief Complaint  Patient presents with   Insect Bite    Dawn [name] is a 52 y.o. female.  Dawn [name] is a 52 y.o. female with hx of HIV, and anxiety, who presents for evaluation of a insect bite.  Patient reports that a few days ago she was bit on the back of her right thigh.  She reports it is mildly painful but continues to be itchy.  She is very concerned that there are falls at her apartment and that her landlord is supposed to have the area sprayed but has not.  She does not know what kind of bug bit her but has seen some sort of while using her apartment.  She reports that she was seen in urgent care for the insect bite 2 days ago and was prescribed an antibiotic that she has been taking.  Denies any fevers, no new bug bites that she is aware of.  No other aggravating or alleviating factors.  The history is provided by the patient.       Home Medications Prior to Admission medications   Medication Sig Start Date End Date Taking? Authorizing Provider  diphenhydrAMINE-zinc acetate (BENADRYL EXTRA STRENGTH) cream Apply 1 Application topically 3 (three) times daily as needed for itching. 01/25/23  Yes Dartha Lodge, PA-C  albuterol (VENTOLIN HFA) 108 (90 Base) MCG/ACT inhaler Inhale 1 puff into the lungs every 6 (six) hours as needed for wheezing or shortness of breath. 06/14/21   Lamptey, Britta Mccreedy, MD  BIKTARVY 50-200-25 MG TABS tablet Take 1 tablet by mouth daily. 12/18/22   [provider]  cephALEXin (KEFLEX) 500 MG capsule Take 1 capsule (500 mg total) by mouth 2 (two) times daily for 5 days. 01/23/23 01/28/23  Bing Neighbors, NP  darunavir-cobicistat (PREZCOBIX) 800-150 MG tablet TAKE ONE TABLET BY MOUTH DAILY WITH BREAKFAST *SWALLOW WHOLE. DO NOT CRUSH, BREAK OR CHEW TABLETS* 10/05/22   Judyann Munson, MD  dicyclomine  (BENTYL) 20 MG tablet Take 1 tablet (20 mg total) by mouth 2 (two) times daily. Patient not taking: Reported on 12/05/2022 08/17/22   Raspet, Noberto Retort, PA-C  emtricitabine-tenofovir AF (DESCOVY) 200-25 MG tablet Take 1 tablet by mouth daily. 10/05/22   Judyann Munson, MD  erythromycin ophthalmic ointment Place a 1/2 inch ribbon of ointment into the lower eyelid. 11/16/22   Garrison, [place] N, FNP  ferrous sulfate 325 (65 FE) MG tablet Take 1 tablet (325 mg total) by mouth daily. 09/04/20   Petrucelli, Samantha R, PA-C  FLUoxetine (PROZAC) 20 MG capsule Take 1 capsule (20 mg total) by mouth daily. 04/17/22   Judyann Munson, MD  fluticasone (FLONASE) 50 MCG/ACT nasal spray Place 1 spray into both nostrils daily for 5 days. 11/16/22 12/05/22  Garrison, [place] N, FNP  hydrochlorothiazide (HYDRODIURIL) 25 MG tablet Take 0.5 tablets (12.5 mg total) by mouth daily. 06/22/22   Judyann Munson, MD  meloxicam (MOBIC) 7.5 MG tablet Take 7.5 mg by mouth daily. 09/19/22   [provider]  ondansetron (ZOFRAN-ODT) 4 MG disintegrating tablet Dissolve 1 tablet under the tongue every 8 (eight) hours as needed for nausea or vomiting. 12/14/22   Tegeler, Canary Brim, MD  rosuvastatin (CRESTOR) 10 MG tablet Take 1 tablet (10 mg total) by mouth daily. 10/05/22   Judyann Munson, MD  tretinoin (RETIN-A) 0.05 % cream Apply topically at  bedtime. 11/30/22   Lockie Mola, MD  triamcinolone (KENALOG) 0.025 % ointment Apply 1 Application topically 2 (two) times daily as needed (apply to affected area). 01/23/23   Bing Neighbors, NP  valACYclovir (VALTREX) 1000 MG tablet TAKE ONE TABLET BY MOUTH DAILY 12/18/22   Judyann Munson, MD  diphenhydrAMINE (BENADRYL) 25 MG tablet Take 1 tablet (25 mg total) by mouth every 6 (six) hours as needed for up to 3 days. 06/17/20 09/04/20  Orvil Feil, PA-C  famotidine (PEPCID) 20 MG tablet Take 1 tablet (20 mg total) by mouth 2 (two) times daily for 5 days. 06/17/20 09/04/20  Orvil Feil,  PA-C      Allergies    Chocolate and Tomato    Review of Systems   Review of Systems  Constitutional:  Negative for chills and fever.  Skin:  Positive for color change.       Insect bite    Physical Exam Updated Vital Signs BP (!) 143/80 (BP Location: Right Arm)   Pulse 93   Temp 98.5 F (36.9 C) (Oral)   Resp 16   Ht 5\' 4"  (1.626 m)   Wt 68 kg   LMP 01/19/2023 (Exact Date)   SpO2 100%   BMI 25.75 kg/m  Physical Exam Vitals and nursing note reviewed.  Constitutional:      General: She is not in acute distress.    Appearance: Normal appearance. She is well-developed. She is not ill-appearing or diaphoretic.  HENT:     Head: Normocephalic and atraumatic.  Eyes:     General:        Right eye: No discharge.        Left eye: No discharge.  Pulmonary:     Effort: Pulmonary effort is normal. No respiratory distress.  Musculoskeletal:     Comments: Small welts in the back of the right thigh without surrounding erythema, no palpable induration or fluctuance, no necrotic center noted  Neurological:     Mental Status: She is alert and oriented to person, place, and time.     Coordination: Coordination normal.  Psychiatric:        Mood and Affect: Mood normal.        Behavior: Behavior normal.     ED Results / Procedures / Treatments   Labs (all labs ordered are listed, but only abnormal results are displayed) Labs Reviewed - No data to display  EKG None  Radiology No results found.  Procedures Procedures    Medications Ordered in ED Medications - No data to display  ED Course/ Medical Decision Making/ A&P                             Medical Decision Making Risk OTC drugs.   Patient with a small red welts on the back of the right thigh that does appear most consistent with a insect bite, no other white lesions noted elsewhere, no signs of surrounding infection or abscess.  Encourage patient to complete course of antibiotics as prescribed by urgent  care.  Given prescription for Benadryl cream to help with itching and encouraged to avoid scratching the area.  At this time there does not appear to be any evidence of an acute emergency medical condition requiring further emergent evaluation and the patient appears stable for discharge with appropriate outpatient follow up. Diagnosis and return precautions discussed with patient who verbalizes understanding and is agreeable to discharge.  Final Clinical Impression(s) / ED Diagnoses Final diagnoses:  Insect bite of right thigh, initial encounter    Rx / DC Orders ED Discharge Orders          Ordered    diphenhydrAMINE-zinc acetate (BENADRYL EXTRA STRENGTH) cream  3 times daily PRN        01/25/23 1241              Dartha Lodge, PA-C 01/28/23 1935    Elayne Snare K, DO 01/30/23 0700

## 2023-01-31 DIAGNOSIS — F99 Mental disorder, not otherwise specified: Secondary | ICD-10-CM | POA: Diagnosis not present

## 2023-02-06 ENCOUNTER — Telehealth: Payer: Self-pay

## 2023-02-06 NOTE — Telephone Encounter (Signed)
Patient called office stating she has a boil on her right buttocks that appeared a few days ago. Would like to know if provider can call in antibiotics to pharmacy.  Is taking Sitz bath and using warm compress, but does not notice any changes in boil. Juanita Laster, RMA

## 2023-02-12 ENCOUNTER — Other Ambulatory Visit: Payer: Self-pay

## 2023-02-12 ENCOUNTER — Other Ambulatory Visit (HOSPITAL_COMMUNITY): Payer: Self-pay

## 2023-02-12 ENCOUNTER — Telehealth: Payer: Self-pay

## 2023-02-12 DIAGNOSIS — B20 Human immunodeficiency virus [HIV] disease: Secondary | ICD-10-CM

## 2023-02-12 DIAGNOSIS — B009 Herpesviral infection, unspecified: Secondary | ICD-10-CM

## 2023-02-12 MED ORDER — ROSUVASTATIN CALCIUM 10 MG PO TABS
10.0000 mg | ORAL_TABLET | Freq: Every day | ORAL | 11 refills | Status: DC
  Filled 2023-02-12 – 2023-04-23 (×2): qty 30, 30d supply, fill #0
  Filled 2023-05-25 – 2023-06-20 (×2): qty 30, 30d supply, fill #1
  Filled 2023-07-31: qty 30, 30d supply, fill #2
  Filled 2023-09-25: qty 30, 30d supply, fill #3
  Filled 2023-11-30: qty 30, 30d supply, fill #4
  Filled 2023-12-24 (×2): qty 30, 30d supply, fill #5
  Filled 2024-01-22: qty 30, 30d supply, fill #6

## 2023-02-12 MED ORDER — DESCOVY 200-25 MG PO TABS
1.0000 | ORAL_TABLET | Freq: Every day | ORAL | 4 refills | Status: DC
Start: 2023-02-12 — End: 2023-08-02
  Filled 2023-02-12 – 2023-03-06 (×2): qty 30, 30d supply, fill #0
  Filled 2023-04-23: qty 30, 30d supply, fill #1
  Filled 2023-06-20: qty 30, 30d supply, fill #2
  Filled 2023-07-31: qty 30, 30d supply, fill #3

## 2023-02-12 MED ORDER — VALACYCLOVIR HCL 1 G PO TABS
1000.0000 mg | ORAL_TABLET | Freq: Every day | ORAL | 5 refills | Status: DC
Start: 2023-02-12 — End: 2024-01-22
  Filled 2023-02-12 – 2023-04-11 (×2): qty 30, 30d supply, fill #0
  Filled 2023-04-23 – 2023-05-28 (×2): qty 30, 30d supply, fill #1
  Filled 2023-07-31: qty 30, 30d supply, fill #2
  Filled 2023-09-25: qty 30, 30d supply, fill #3
  Filled 2023-11-30: qty 30, 30d supply, fill #4
  Filled 2023-12-24 (×2): qty 30, 30d supply, fill #5

## 2023-02-12 MED ORDER — PREZCOBIX 800-150 MG PO TABS
ORAL_TABLET | ORAL | 4 refills | Status: DC
Start: 2023-02-12 — End: 2023-08-02
  Filled 2023-02-12: qty 30, fill #0
  Filled 2023-03-06: qty 30, 30d supply, fill #0
  Filled 2023-04-23: qty 30, 30d supply, fill #1
  Filled 2023-06-20: qty 30, 30d supply, fill #2
  Filled 2023-07-31: qty 30, 30d supply, fill #3

## 2023-02-12 NOTE — Telephone Encounter (Signed)
She cannot take Flonase while on Prezcobix. Looks like that rx is expired, and she never filled it. Unfortunately, she could still have gotten it over the counter. Please let her know to avoid Flonase or other steroid nasal sprays. If she needs to use them, we will need to prescribe an alternative because the risk for adrenal suppression is very high with Prezcobix. Marchelle Folks

## 2023-02-12 NOTE — Telephone Encounter (Signed)
Patient aware. Patient stated that she stopped taking Flonase a while ago. Medication removed from med list.

## 2023-02-12 NOTE — Telephone Encounter (Signed)
Patient called office stating pharmacy did not mail her Valtrex. Is requesting all her medication be sent to [facility], and have meds mailed. Juanita Laster, RMA

## 2023-02-12 NOTE — Addendum Note (Signed)
Addended by: Marcell Anger on: 02/12/2023 03:50 PM   Modules accepted: Orders

## 2023-02-12 NOTE — Telephone Encounter (Signed)
Trying to refill prezcobix and descovy, please advise if okay to refill.      Attention prescriber:  Drug-Drug interaction risk level D   Protease inhibitor or Cobicistat and inhaled corticosteroid     This patient has orders for interacting protease inhibitor or cobicistat and inhaled corticosteroid.  This drug interaction, including both orally and nasally inhaled corticosteroids, may result in hypothalamus-pituitary adrenal axis suppression.  Please discontinue or modify inhaled corticosteroid to one of the safer options listed below.   Nasal ICS Oral ICS  Beclomethasone (Qnasl) Beclomethasone (Qvar) + PRN Albuterol   Flunisolide (Nasalide) Formoterol / Mometasone (Dulera) only if combination product required, otherwise use separate products  3rd line:  Mometasone (Nasonex)

## 2023-02-13 ENCOUNTER — Other Ambulatory Visit: Payer: Self-pay

## 2023-02-26 DIAGNOSIS — F99 Mental disorder, not otherwise specified: Secondary | ICD-10-CM | POA: Diagnosis not present

## 2023-02-27 ENCOUNTER — Other Ambulatory Visit: Payer: Self-pay

## 2023-03-06 ENCOUNTER — Other Ambulatory Visit: Payer: Self-pay

## 2023-03-06 ENCOUNTER — Other Ambulatory Visit (HOSPITAL_COMMUNITY): Payer: Self-pay

## 2023-03-07 ENCOUNTER — Other Ambulatory Visit: Payer: Self-pay | Admitting: Internal Medicine

## 2023-03-07 DIAGNOSIS — B20 Human immunodeficiency virus [HIV] disease: Secondary | ICD-10-CM

## 2023-03-07 DIAGNOSIS — B009 Herpesviral infection, unspecified: Secondary | ICD-10-CM

## 2023-03-08 ENCOUNTER — Other Ambulatory Visit: Payer: Self-pay

## 2023-03-14 ENCOUNTER — Other Ambulatory Visit: Payer: Self-pay

## 2023-03-14 ENCOUNTER — Other Ambulatory Visit (HOSPITAL_COMMUNITY): Payer: Self-pay

## 2023-03-15 ENCOUNTER — Other Ambulatory Visit (HOSPITAL_COMMUNITY): Payer: Self-pay

## 2023-03-15 ENCOUNTER — Telehealth: Payer: Self-pay

## 2023-03-15 ENCOUNTER — Other Ambulatory Visit: Payer: Self-pay

## 2023-03-15 DIAGNOSIS — R030 Elevated blood-pressure reading, without diagnosis of hypertension: Secondary | ICD-10-CM

## 2023-03-15 DIAGNOSIS — F329 Major depressive disorder, single episode, unspecified: Secondary | ICD-10-CM

## 2023-03-15 MED ORDER — FLUOXETINE HCL 20 MG PO CAPS
20.0000 mg | ORAL_CAPSULE | Freq: Every day | ORAL | 3 refills | Status: AC
Start: 2023-03-15 — End: ?
  Filled 2023-03-15 (×2): qty 30, 30d supply, fill #0
  Filled 2023-04-23: qty 30, 30d supply, fill #1
  Filled 2023-05-25 – 2023-06-20 (×2): qty 30, 30d supply, fill #2
  Filled 2023-07-31: qty 30, 30d supply, fill #3

## 2023-03-15 MED ORDER — HYDROCHLOROTHIAZIDE 25 MG PO TABS
12.5000 mg | ORAL_TABLET | Freq: Every day | ORAL | 3 refills | Status: DC
Start: 2023-03-15 — End: 2023-05-14
  Filled 2023-03-15 (×2): qty 30, 60d supply, fill #0
  Filled 2023-04-23: qty 30, 60d supply, fill #1

## 2023-03-15 NOTE — Telephone Encounter (Signed)
-----   Message from Augusta H sent at 03/15/2023  9:50 AM EDT ----- Regarding: FW: Medications  ----- Message ----- From: Merleen Nicely Sent: 03/14/2023   3:48 PM EDT To: [facility] Triage Nurse Pool Subject: Medications                                    Patient left a voicemail needing someone to call her concerning her medications.  Thank you!

## 2023-03-15 NOTE — Telephone Encounter (Addendum)
Spoke with Debbe Odea, notified her that Descovy, Prezcobix, rosuvastatin, and valacyclovir were sent to [facility].   Per Dr. Drue Second, okay to refill fluoxetine and hydrochlorothiazide. Refills sent to [facility].   DDI meloxicam and fluoxetine, spoke with [facility], patient has not filled meloxicam since 09/2022.   Sandie Ano, RN

## 2023-03-15 NOTE — Telephone Encounter (Signed)
I would worry about her taking meloxicam and fluoxetine concomitantly for a long period of time given the increased risk for bleeding is well documented/studied. However, if she were to use it in the short term, I would not be as concerned about it. Some references recommend prescribing prophylaxis with antiulcer drugs though I do not know how supported that is. The main thing would be for her to know to look out for increased signs of gastrointestinal or intracranial bleeding. Hope this helps!

## 2023-03-15 NOTE — Telephone Encounter (Signed)
Spoke with [place] and discussed the interaction of meloxicam (and other NSAIDs like ibuprofen) with fluoxetine. She hasn't been filling the meloxicam and states she only takes ibuprofen on an as needed basis. Discussed signs of GI bleed such as blood in stool or dark/black stools. Patient verbalized understanding and has no further questions.   Sandie Ano, RN

## 2023-03-21 ENCOUNTER — Other Ambulatory Visit (HOSPITAL_COMMUNITY): Payer: Self-pay

## 2023-03-26 ENCOUNTER — Other Ambulatory Visit: Payer: Self-pay

## 2023-03-29 ENCOUNTER — Other Ambulatory Visit: Payer: Self-pay

## 2023-04-02 DIAGNOSIS — F99 Mental disorder, not otherwise specified: Secondary | ICD-10-CM | POA: Diagnosis not present

## 2023-04-06 ENCOUNTER — Other Ambulatory Visit: Payer: Self-pay

## 2023-04-11 ENCOUNTER — Other Ambulatory Visit (HOSPITAL_COMMUNITY): Payer: Self-pay

## 2023-04-17 NOTE — Congregational Nurse Program (Signed)
  Dept: [phone]   Congregational Nurse Program Note  Date of Encounter: 04/17/2023  Clinic visit to check blood pressure, BP 128/1, pulse 74 and regular, O2 Sat 99%.  Educated regarding normal blood pressure levels and strategies to prevent hypertension. 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 Yes    Student Assistance N/A    Location Patient [place]    Visit Setting with Client Organization    Patient Status Unknown   Has apartment at [facility]    Insurance/Financial Assistance Referral N/A    Medication N/A    Medical Provider Yes    Screening Referrals Made N/A    Medical Referrals Made N/A    Medical Appointment Made N/A    Recently w/o PCP, now 1st time PCP visit completed due to CNs referral or appointment made N/A    Food N/A    Transportation Need transportation assistance    Housing/Utilities N/A    Interpersonal Safety N/A    Interventions Spiritual Care;Counsel;Advocate/Support;Educate;Reviewed Medications    Abnormal to Normal Screening Since Last CN Visit N/A    Screenings CN Performed Blood Pressure;Pulse Ox    Sent Client to Lab for: N/A    Did client attend any of the following based off CNs referral or appointments made? N/A    ED Visit Averted N/A    Life-Saving Intervention Made N/A

## 2023-04-23 ENCOUNTER — Other Ambulatory Visit: Payer: Self-pay

## 2023-04-23 ENCOUNTER — Other Ambulatory Visit (HOSPITAL_COMMUNITY): Payer: Self-pay

## 2023-04-24 ENCOUNTER — Other Ambulatory Visit: Payer: Self-pay

## 2023-04-27 ENCOUNTER — Other Ambulatory Visit (HOSPITAL_COMMUNITY): Payer: Self-pay

## 2023-04-29 IMAGING — DX DG CHEST 2V
2 series · 2 of 2 positions shown · non-contrast
Comparison: Chest x-ray 09/03/2020

CLINICAL DATA: Cough x3 days, HIV

EXAM:
CHEST - 2 VIEW

[chest pa]
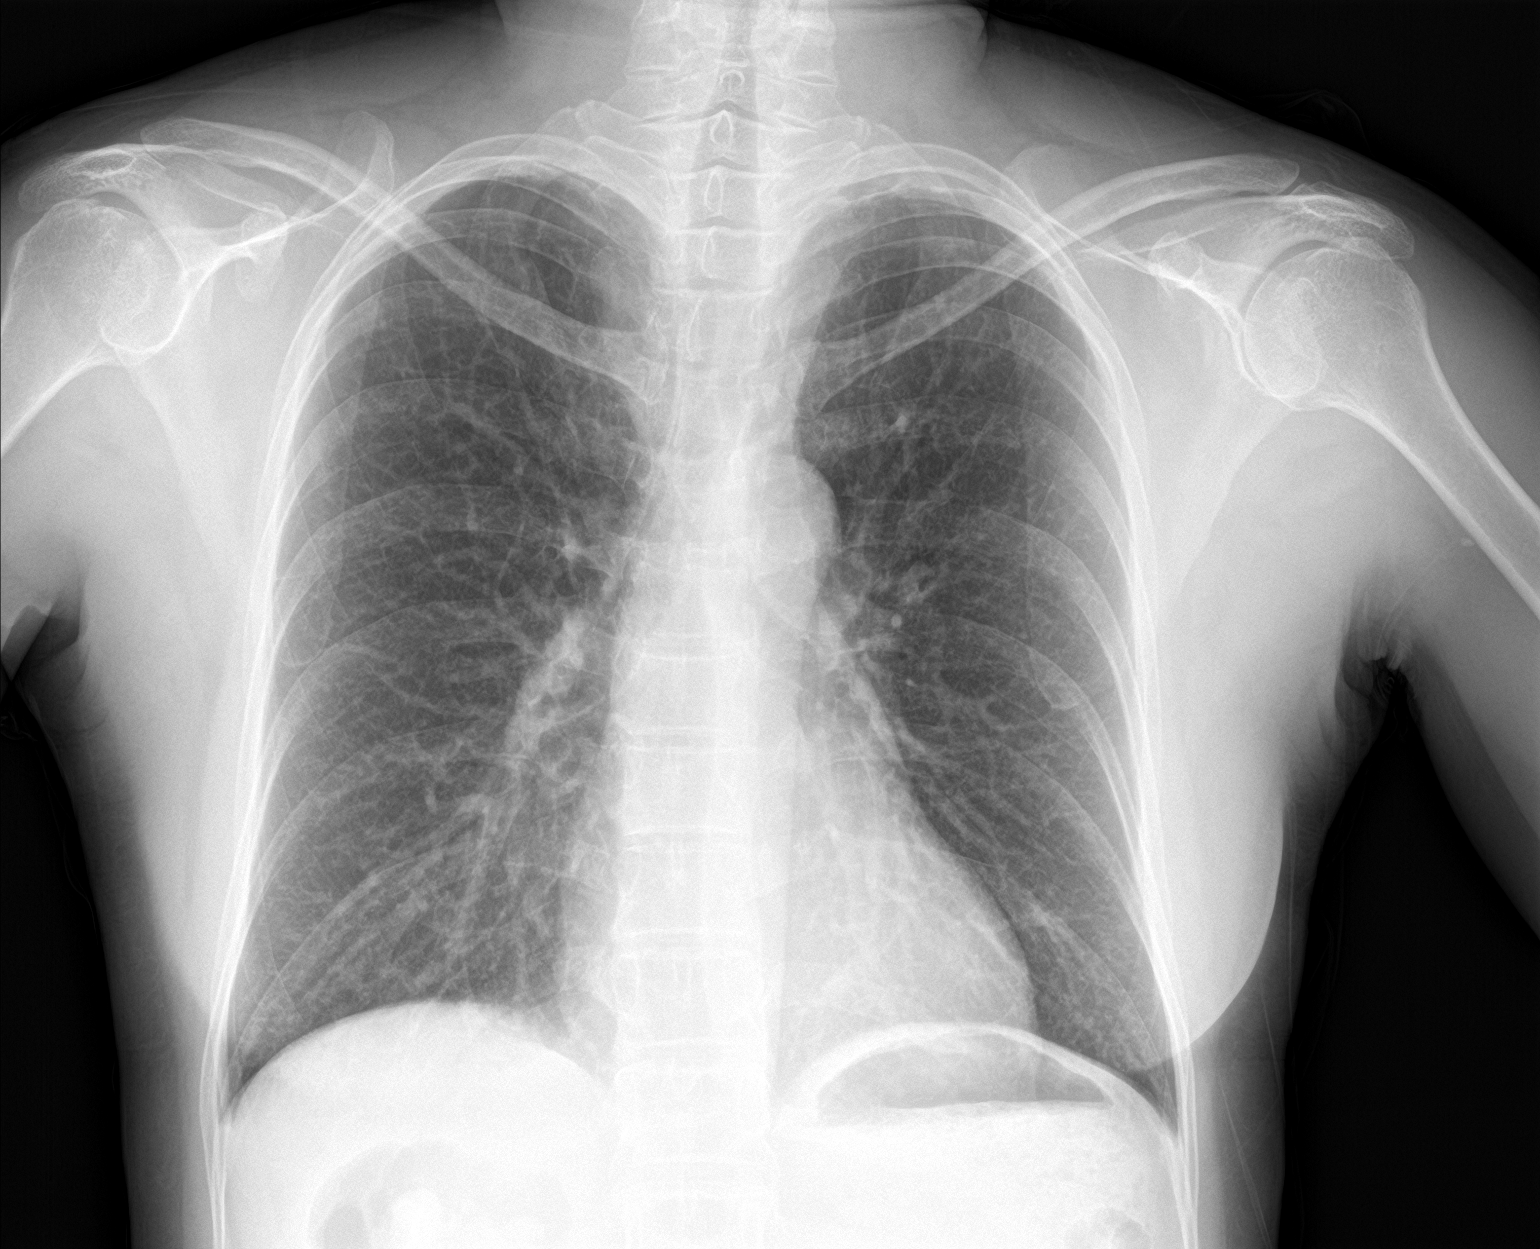

[chest lat]
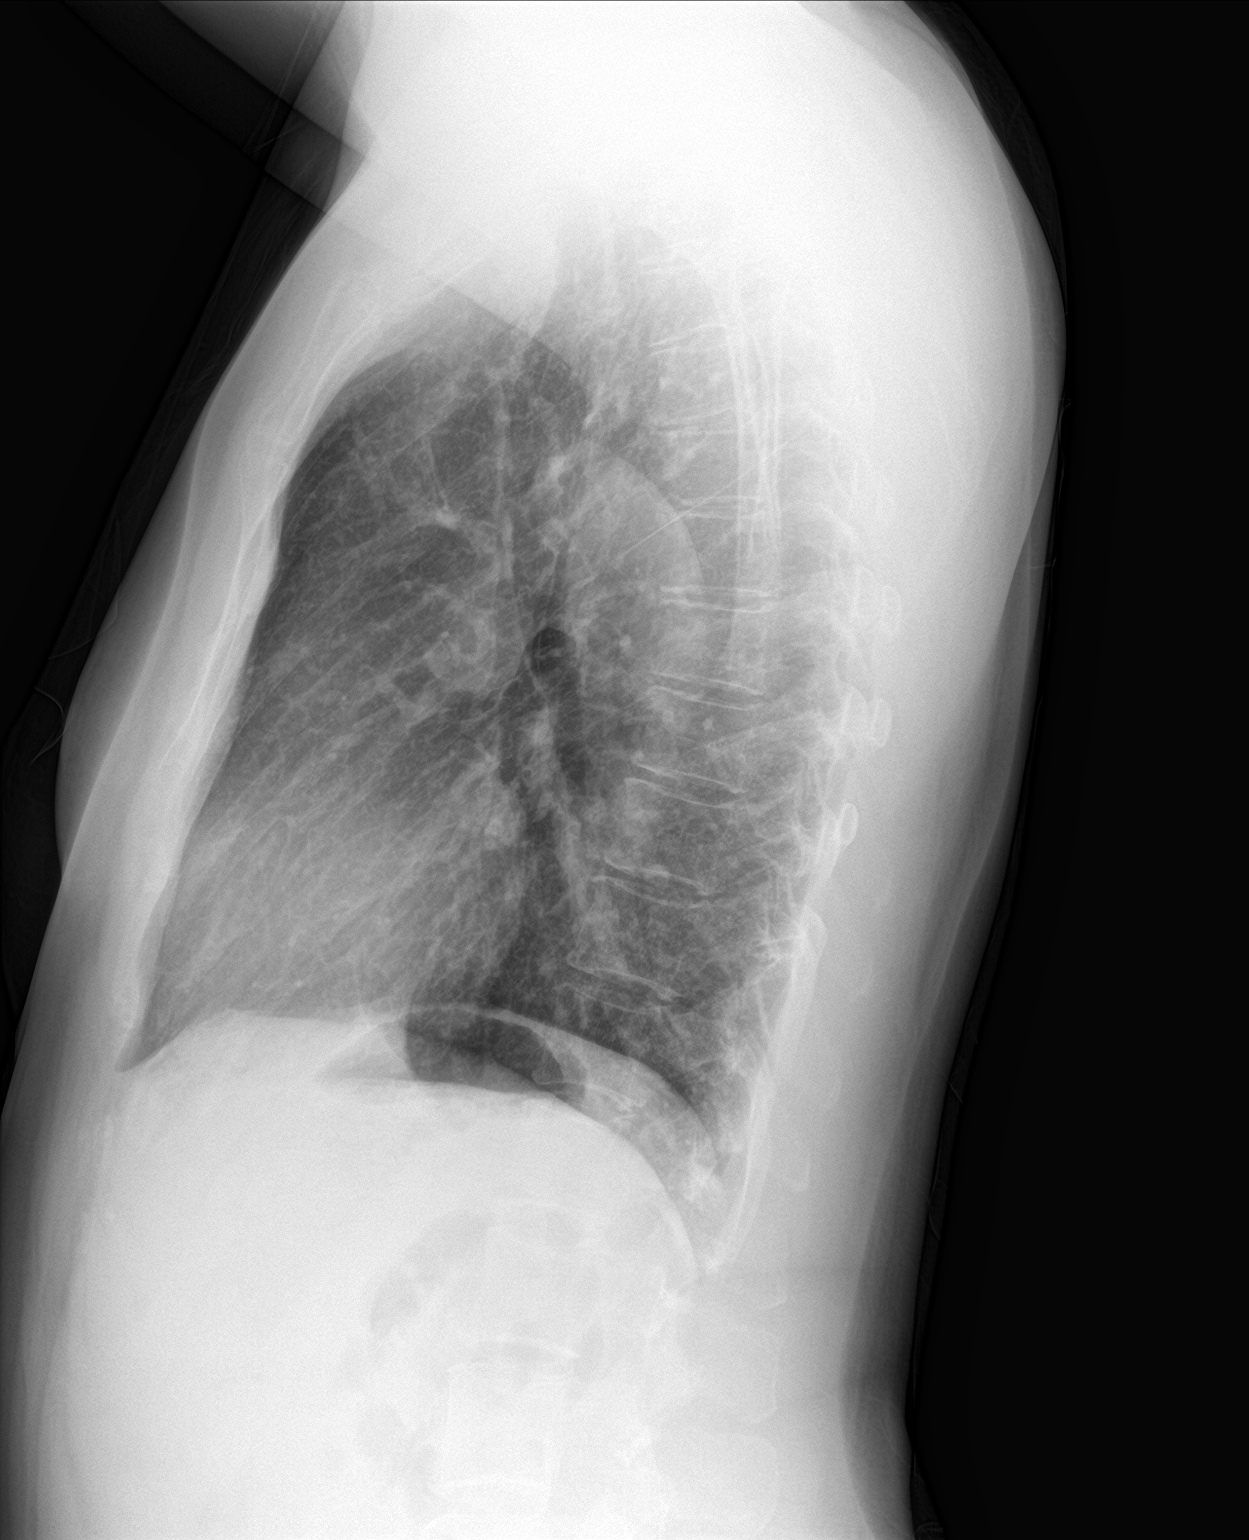

[2 of 2 positions shown; findings below may reference images not displayed]

FINDINGS: The heart and mediastinal contours are unchanged.

Slightly less conspicuous left lower lobe nodular-like opacity.
Biapical pleural/pulmonary scarring no focal consolidation. No
pulmonary edema. No pleural effusion. No pneumothorax.

No acute osseous abnormality.
IMPRESSION: 1. No active cardiopulmonary disease.
2. Slightly less conspicuous left lower lobe nodular-like opacity.
Given persistence, consider nonemergent CT chest with intravenous
contrast for further evaluation.

## 2023-04-30 NOTE — Congregational Nurse Program (Signed)
  Dept: [phone]   Congregational Nurse Program Note  Date of Encounter: 04/24/2023  Clinic visit to check blood pressure, BP 141/86.  Had not take BP medicines today, educated regarding taking prescribed medications on a regular schedule. Discussed dietary requirements to help manage blood pressure and need to manage stress. 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 Yes    Student Assistance N/A    Location Patient [place]    Visit Setting with Client Organization    Patient Status Unknown   Has apartment at [facility]    Insurance/Financial Assistance Referral N/A    Medication N/A    Medical Provider Yes    Screening Referrals Made N/A    Medical Referrals Made N/A    Medical Appointment Made N/A    Recently w/o PCP, now 1st time PCP visit completed due to CNs referral or appointment made N/A    Food N/A    Transportation Need transportation assistance    Housing/Utilities N/A    Interpersonal Safety N/A    Interventions Spiritual Care;Counsel;Advocate/Support;Educate;Reviewed Medications    Abnormal to Normal Screening Since Last CN Visit N/A    Screenings CN Performed Blood Pressure;Pulse Ox    Sent Client to Lab for: N/A    Did client attend any of the following based off CNs referral or appointments made? N/A    ED Visit Averted N/A    Life-Saving Intervention Made N/A

## 2023-05-04 IMAGING — CT CT CHEST W/O CM
2 of 4 series · 15 of 36 positions shown, 18 images · non-contrast
Comparison: Chest x-ray [DATE]

CLINICAL DATA: Follow-up pulmonary nodule.

EXAM:
CT CHEST WITHOUT CONTRAST
TECHNIQUE: Multidetector CT imaging of the chest was performed following the
standard protocol without IV contrast.

[Series 2: thorax · axial · 0.59mm/px · z∈[-309,-83]mm · 12 of 135 slices shown, 15 images]
[im 11/135  mediastinal]
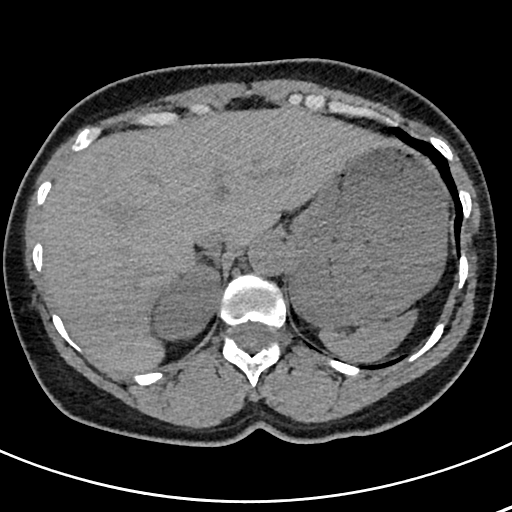
[im 11/135  lung]
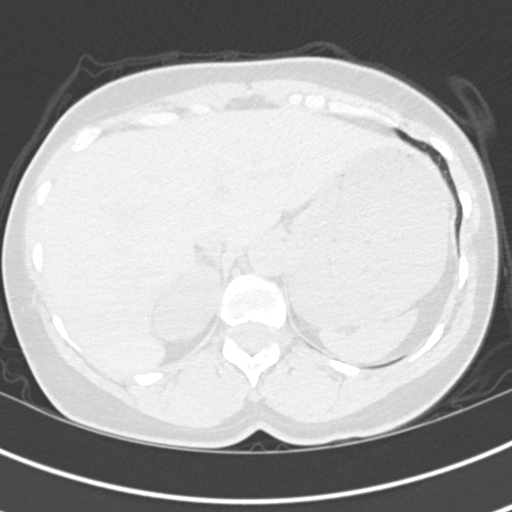
[im 21/135  lung]
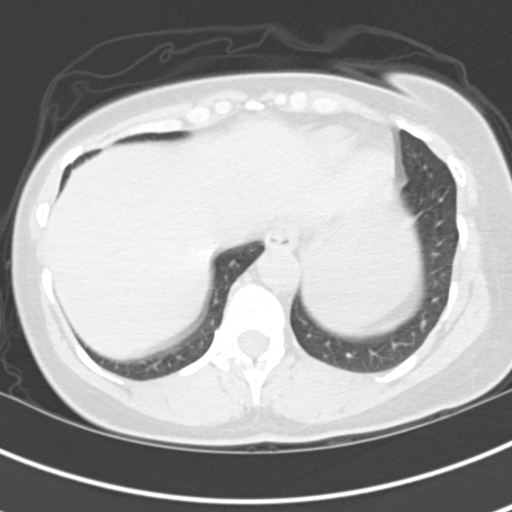
[im 31/135  lung]
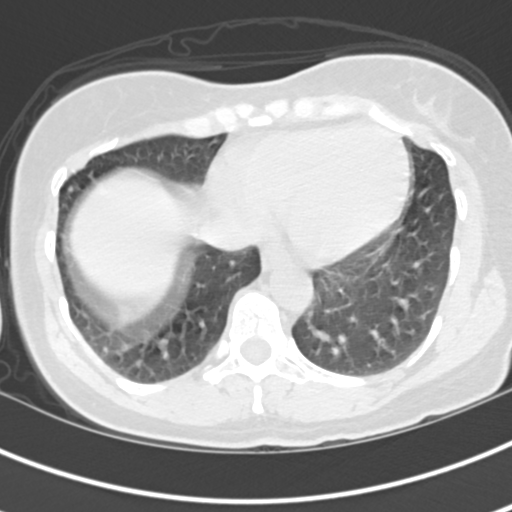
[im 42/135  lung]
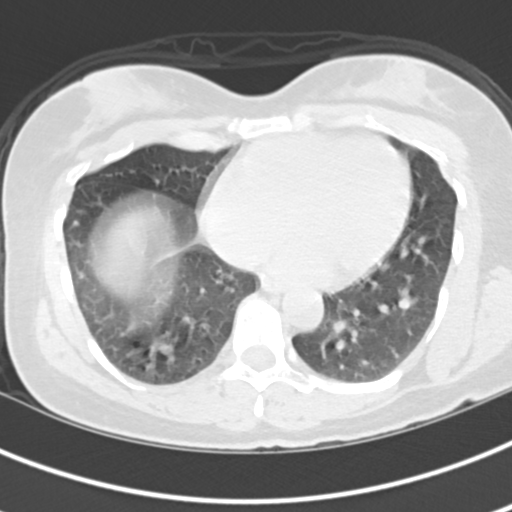
[im 52/135  mediastinal]
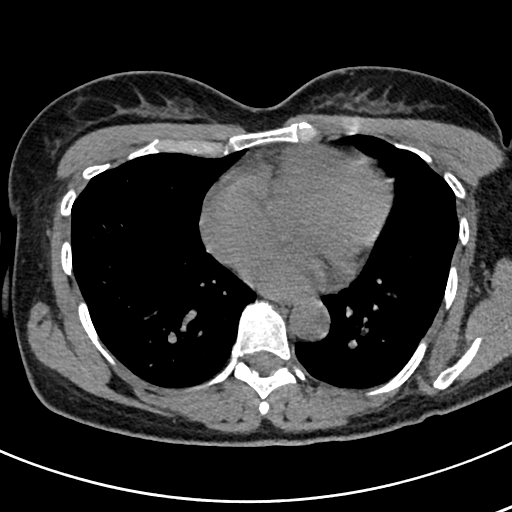
[im 52/135  lung]
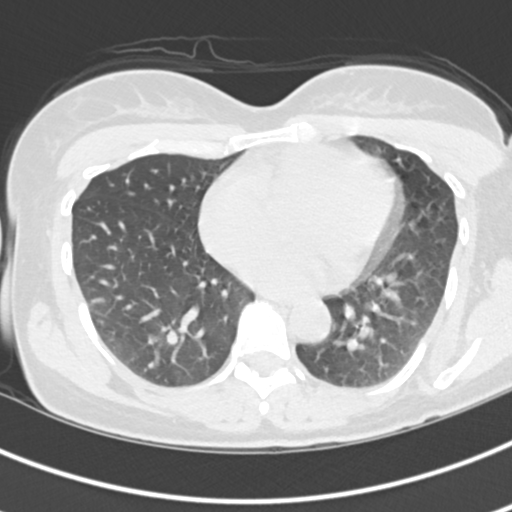
[im 62/135  lung]
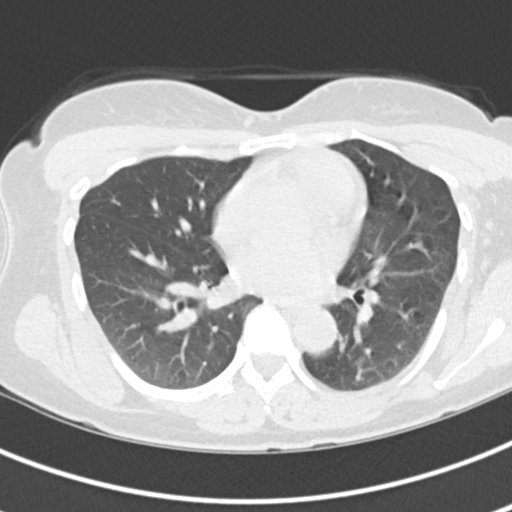
[im 73/135  lung]
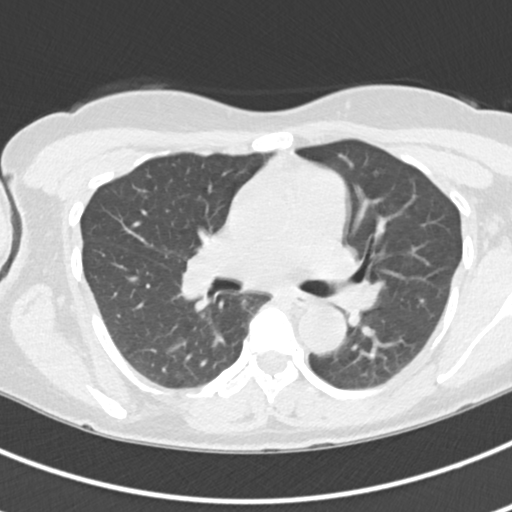
[im 83/135  lung]
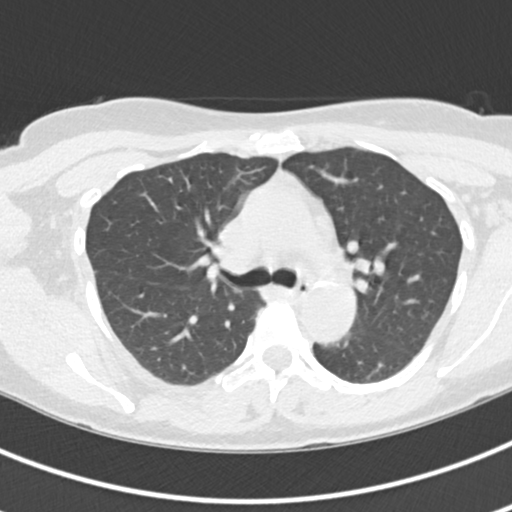
[im 93/135  mediastinal]
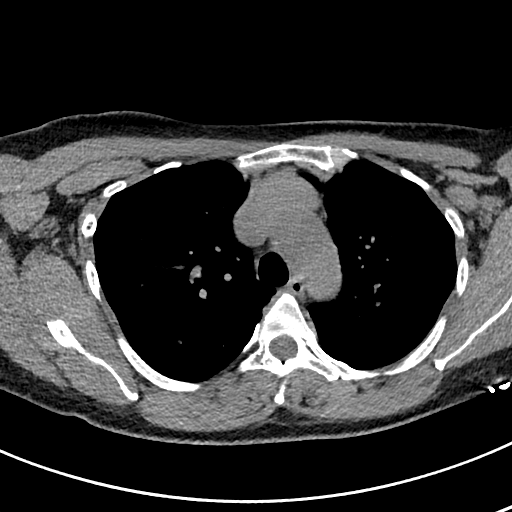
[im 93/135  lung]
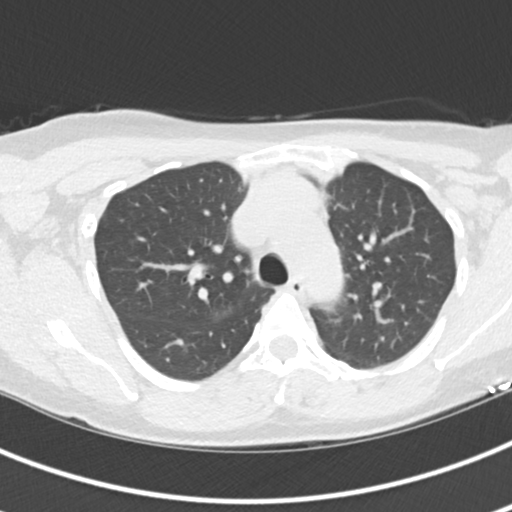
[im 104/135  lung]
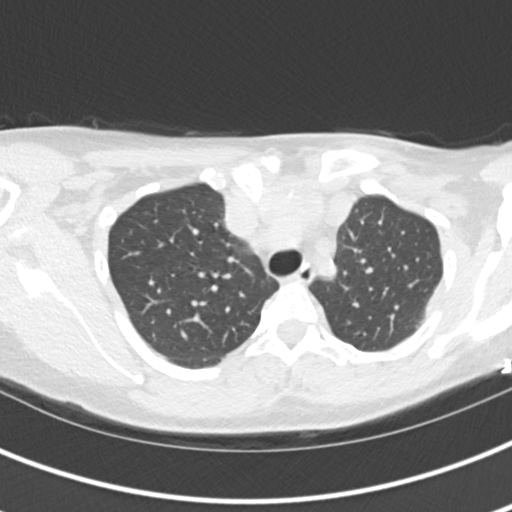
[im 114/135  lung]
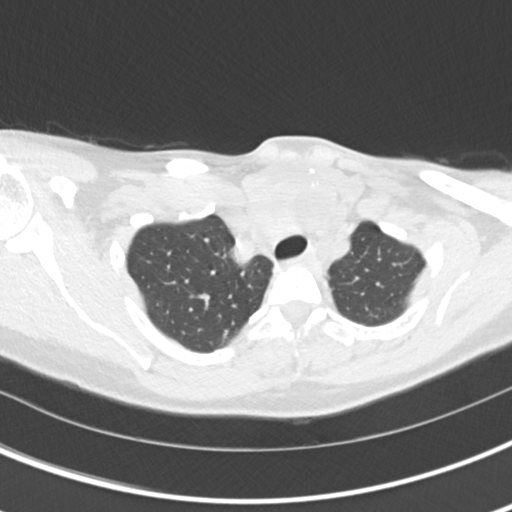
[im 124/135  lung]
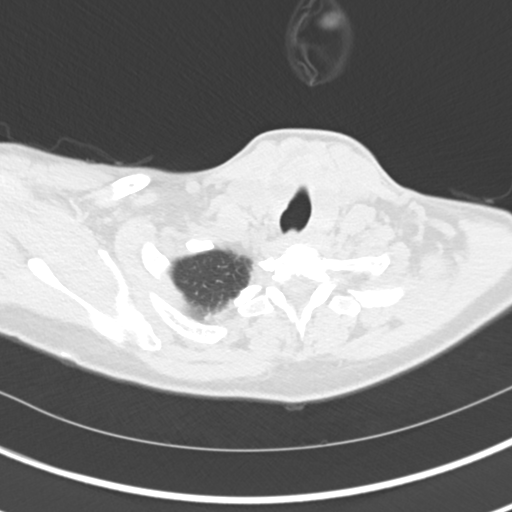

[Series 6: coronal · coronal · 0.54mm/px · 3 of 100 slices shown]
[im 20/100  lung]
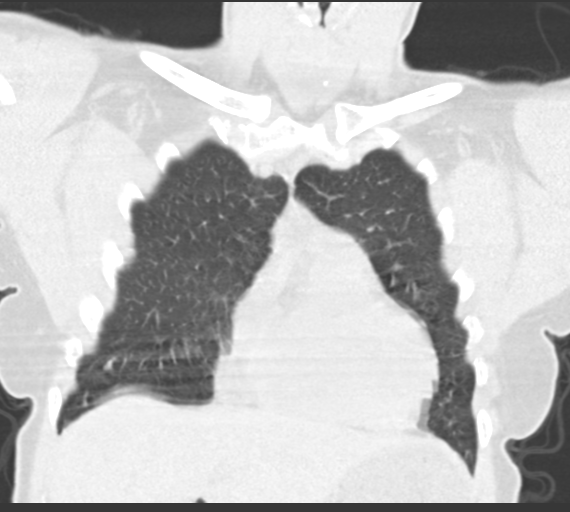
[im 40/100  lung]
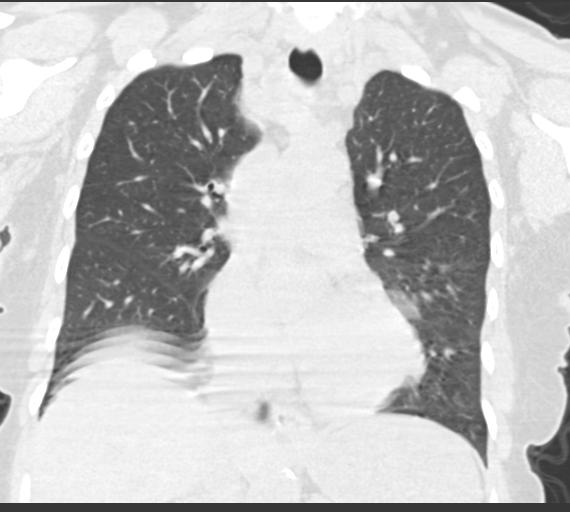
[im 60/100  lung]
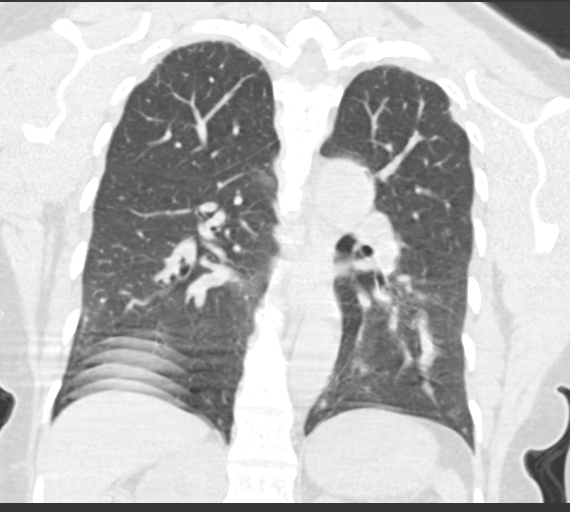

[15 of 36 positions shown; findings below may reference images not displayed]

FINDINGS: Cardiovascular: The heart is normal in size. No pericardial
effusion. The aorta is normal in caliber. No aneurysm. Age advanced
atherosclerotic calcifications are noted.

Mediastinum/Nodes: No obvious mediastinal or hilar adenopathy. The
esophagus is grossly. There is a large complex cystic and solid
appearing thyroid mass. This has been evaluated on previous imaging.
Including thyroid ultrasound and biopsy (Ref: [HOSPITAL]. [DATE]): 143-50).

Lungs/Pleura: Breathing motion artifact but no acute pulmonary
findings. Mild biapical pleural and parenchymal scarring changes. No
infiltrates edema effusions. No pulmonary lesions are identified. No
pulmonary nodules. No pleural effusions or pleural nodules.

Upper Abdomen: No significant upper abdominal findings. Aortic
calcifications are noted.

Musculoskeletal: No significant bony findings.
IMPRESSION: 1. Large complex cystic and solid appearing thyroid mass. This has
been evaluated on previous imaging. Including thyroid ultrasound and
biopsy.
2. No acute pulmonary findings or pulmonary lesions.
3. Age advanced atherosclerotic calcifications involving the aorta
and coronary arteries.
4. Aortic atherosclerosis.

Aortic Atherosclerosis ([id number]).

## 2023-05-08 DIAGNOSIS — F99 Mental disorder, not otherwise specified: Secondary | ICD-10-CM | POA: Diagnosis not present

## 2023-05-09 ENCOUNTER — Other Ambulatory Visit (HOSPITAL_COMMUNITY): Payer: Self-pay

## 2023-05-11 ENCOUNTER — Other Ambulatory Visit (HOSPITAL_COMMUNITY): Payer: Self-pay

## 2023-05-14 ENCOUNTER — Other Ambulatory Visit (HOSPITAL_COMMUNITY)
Admission: RE | Admit: 2023-05-14 | Discharge: 2023-05-14 | Disposition: A | Discharge status: Home / Self Care | Payer: 59 | Source: Ambulatory Visit | Attending: Internal Medicine | Admitting: Internal Medicine

## 2023-05-14 ENCOUNTER — Other Ambulatory Visit: Payer: Self-pay

## 2023-05-14 ENCOUNTER — Other Ambulatory Visit (HOSPITAL_COMMUNITY): Payer: Self-pay

## 2023-05-14 ENCOUNTER — Encounter (HOSPITAL_COMMUNITY): Payer: Self-pay

## 2023-05-14 ENCOUNTER — Encounter: Payer: Self-pay | Admitting: Internal Medicine

## 2023-05-14 ENCOUNTER — Ambulatory Visit (INDEPENDENT_AMBULATORY_CARE_PROVIDER_SITE_OTHER): Payer: MEDICAID | Admitting: Internal Medicine

## 2023-05-14 VITALS — BP 128/89 | HR 99 | Temp 98.0°F | Resp 16 | Wt 158.0 lb

## 2023-05-14 DIAGNOSIS — Z113 Encounter for screening for infections with a predominantly sexual mode of transmission: Secondary | ICD-10-CM | POA: Insufficient documentation

## 2023-05-14 DIAGNOSIS — L700 Acne vulgaris: Secondary | ICD-10-CM

## 2023-05-14 DIAGNOSIS — B20 Human immunodeficiency virus [HIV] disease: Secondary | ICD-10-CM | POA: Diagnosis present

## 2023-05-14 DIAGNOSIS — Z23 Encounter for immunization: Secondary | ICD-10-CM

## 2023-05-14 DIAGNOSIS — R030 Elevated blood-pressure reading, without diagnosis of hypertension: Secondary | ICD-10-CM

## 2023-05-14 MED ORDER — HYDROCHLOROTHIAZIDE 25 MG PO TABS
12.5000 mg | ORAL_TABLET | Freq: Every day | ORAL | 6 refills | Status: DC
Start: 2023-05-14 — End: 2024-06-11
  Filled 2023-05-14: qty 30, 60d supply, fill #0
  Filled 2023-06-20 – 2023-07-02 (×2): qty 30, 60d supply, fill #1
  Filled 2023-07-31: qty 30, 60d supply, fill #2
  Filled 2023-09-25: qty 15, 30d supply, fill #3
  Filled 2023-11-30: qty 15, 30d supply, fill #4
  Filled 2023-12-24 (×2): qty 15, 30d supply, fill #5
  Filled 2024-01-22: qty 15, 30d supply, fill #6
  Filled 2024-02-18: qty 15, 30d supply, fill #7
  Filled 2024-04-29: qty 15, 30d supply, fill #8

## 2023-05-14 MED ORDER — DOXYCYCLINE HYCLATE 100 MG PO TABS
100.0000 mg | ORAL_TABLET | Freq: Two times a day (BID) | ORAL | 0 refills | Status: DC
  Filled 2023-05-14 (×2): qty 10, 5d supply, fill #0

## 2023-05-14 NOTE — Progress Notes (Unsigned)
Patient ID: Dawn [name], female   DOB: [birth year], 52 y.o.   MRN: [id number]  HPI Mayona is 52yo F with well controlled hiv disease, on descovy and prescobix. She is doing well with medication adherence. She reports that she is doing well overall with her health. She would like referral to dermatology. She is concerned about her facial acne. Has had acne scarring in the past for pustular acne/acne vulgaris  Outpatient Encounter Medications as of 05/14/2023  Medication Sig   albuterol (VENTOLIN HFA) 108 (90 Base) MCG/ACT inhaler Inhale 1 puff into the lungs every 6 (six) hours as needed for wheezing or shortness of breath.   darunavir-cobicistat (PREZCOBIX) 800-150 MG tablet TAKE ONE TABLET BY MOUTH DAILY WITH BREAKFAST *SWALLOW WHOLE. DO NOT CRUSH, BREAK OR CHEW TABLETS*   diphenhydrAMINE-zinc acetate (BENADRYL EXTRA STRENGTH) cream Apply 1 Application topically 3 (three) times daily as needed for itching.   emtricitabine-tenofovir AF (DESCOVY) 200-25 MG tablet Take 1 tablet by mouth daily.   ferrous sulfate 325 (65 FE) MG tablet Take 1 tablet (325 mg total) by mouth daily.   FLUoxetine (PROZAC) 20 MG capsule Take 1 capsule (20 mg total) by mouth daily.   hydrochlorothiazide (HYDRODIURIL) 25 MG tablet Take 0.5 tablets (12.5 mg total) by mouth daily.   valACYclovir (VALTREX) 1000 MG tablet Take 1 tablet (1,000 mg total) by mouth daily.   dicyclomine (BENTYL) 20 MG tablet Take 1 tablet (20 mg total) by mouth 2 (two) times daily. (Patient not taking: Reported on 12/05/2022)   erythromycin ophthalmic ointment Place a 1/2 inch ribbon of ointment into the lower eyelid. (Patient not taking: Reported on 05/14/2023)   meloxicam (MOBIC) 7.5 MG tablet Take 7.5 mg by mouth daily. (Patient not taking: Reported on 05/14/2023)   ondansetron (ZOFRAN-ODT) 4 MG disintegrating tablet Dissolve 1 tablet under the tongue every 8 (eight) hours as needed for nausea or vomiting. (Patient not taking: Reported on  05/14/2023)   rosuvastatin (CRESTOR) 10 MG tablet Take 1 tablet (10 mg total) by mouth daily. (Patient not taking: Reported on 05/14/2023)   tretinoin (RETIN-A) 0.05 % cream Apply topically at bedtime. (Patient not taking: Reported on 05/14/2023)   triamcinolone (KENALOG) 0.025 % ointment Apply 1 Application topically 2 (two) times daily as needed (apply to affected area). (Patient not taking: Reported on 05/14/2023)   [DISCONTINUED] diphenhydrAMINE (BENADRYL) 25 MG tablet Take 1 tablet (25 mg total) by mouth every 6 (six) hours as needed for up to 3 days.   [DISCONTINUED] famotidine (PEPCID) 20 MG tablet Take 1 tablet (20 mg total) by mouth 2 (two) times daily for 5 days.   No facility-administered encounter medications on file as of 05/14/2023.     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   Vaginal discharge 08/14/2022   Enlarged thyroid gland 01/30/2022   Fibroids 07/12/2021   Need for prophylactic vaccination and inoculation against influenza 07/12/2021   Viral illness 01/08/2019   HIV disease (HCC) 03/05/2017   HTN (hypertension) 03/05/2017   Anemia 03/13/2016   Anemia due to chronic blood loss 03/13/2016   Acquired immune deficiency syndrome (HCC) 12/07/2015   Acne vulgaris 12/07/2015   Mood disorder (HCC) 12/07/2015     Health Maintenance Due  Topic Date Due   Zoster Vaccines- Shingrix (1 of 2) Never done   Colonoscopy  Never done   COVID-19 Vaccine (3 - Pfizer risk series) 07/20/2022  INFLUENZA VACCINE  03/29/2023   MAMMOGRAM  04/14/2023     Review of Systems 12 point ros is negative except what is mentioned above Physical Exam   Pulse 99   Temp 98 F (36.7 C) (Temporal)   Resp 16   Wt 158 lb (71.7 kg)   SpO2 97%   BMI 27.12 kg/m   Physical Exam  Constitutional:  oriented to person, place, and time. appears well-developed and well-nourished. No  distress.  HENT: [place]/AT, PERRLA, no scleral icterus Mouth/Throat: Oropharynx is clear and moist. No oropharyngeal exudate.  Cardiovascular: Normal rate, regular rhythm and normal heart sounds. Exam reveals no gallop and no friction rub.  No murmur heard.  Pulmonary/Chest: Effort normal and breath sounds normal. No respiratory distress.  has no wheezes.  Neck = supple, no nuchal rigidity Abdominal: Soft. Bowel sounds are normal.  exhibits no distension. There is no tenderness.  Lymphadenopathy: no cervical adenopathy. No axillary adenopathy Neurological: alert and oriented to person, place, and time.  Skin: Skin is warm and dry. No rash noted. No erythema. Scattered small acne but appears to have scarring from acne as well Psychiatric: a normal mood and affect.  behavior is normal.   Lab Results  Component Value Date   CD4TCELL 46 12/05/2022   Lab Results  Component Value Date   CD4TABS 515 12/05/2022   CD4TABS 488 10/05/2022   CD4TABS 565 04/17/2022   Lab Results  Component Value Date   HIV1RNAQUANT <20 (H) 10/05/2022   Lab Results  Component Value Date   HEPBSAB POS (A) 12/09/2015   Lab Results  Component Value Date   LABRPR NON-REACTIVE 10/05/2022    CBC Lab Results  Component Value Date   WBC 3.2 (L) 12/14/2022   RBC 4.10 12/14/2022   HGB 10.7 (L) 12/14/2022   HCT 33.5 (L) 12/14/2022   PLT 289 12/14/2022   MCV 81.7 12/14/2022   MCH 26.1 12/14/2022   MCHC 31.9 12/14/2022   RDW 18.1 (H) 12/14/2022   LYMPHSABS 1.4 12/14/2022   MONOABS 0.3 12/14/2022   EOSABS 0.0 12/14/2022    BMET Lab Results  Component Value Date   NA 138 12/14/2022   K 3.6 12/14/2022   CL 103 12/14/2022   CO2 27 12/14/2022   GLUCOSE 87 12/14/2022   BUN 14 12/14/2022   CREATININE 0.93 12/14/2022   CALCIUM 9.4 12/14/2022   GFRNONAA >60 12/14/2022   GFRAA 87 02/02/2021      Assessment and Plan  Acne = Derm referral for acne to dr hall  Hiv disease= will get labs today. Continue  on current meds  Long term medication management = will check cr to see that she is still stable  Pre-hypertension = on repeat bp check, she is now WNL  Health maintenance = will get flu shot today

## 2023-05-15 ENCOUNTER — Other Ambulatory Visit: Payer: Self-pay

## 2023-05-15 LAB — T-HELPER CELL (CD4) - (RCID CLINIC ONLY)
CD4 % Helper T Cell: 52 % (ref 33–65)
CD4 T Cell Abs: 705 /uL (ref 400–1790)

## 2023-05-15 LAB — URINE CYTOLOGY ANCILLARY ONLY
Chlamydia: NEGATIVE
Comment: NEGATIVE
Comment: NORMAL
Neisseria Gonorrhea: NEGATIVE

## 2023-05-16 LAB — CBC WITH DIFFERENTIAL/PLATELET
Absolute Monocytes: 402 {cells}/uL (ref 200–950)
Basophils Absolute: 31 {cells}/uL (ref 0–200)
Basophils Relative: 0.8 %
Eosinophils Absolute: 82 {cells}/uL (ref 15–500)
Eosinophils Relative: 2.1 %
HCT: 34.6 % — ABNORMAL LOW (ref 35.0–45.0)
Hemoglobin: 10.9 g/dL — ABNORMAL LOW (ref 11.7–15.5)
Lymphs Abs: 1708 {cells}/uL (ref 850–3900)
MCH: 27.8 pg (ref 27.0–33.0)
MCHC: 31.5 g/dL — ABNORMAL LOW (ref 32.0–36.0)
MCV: 88.3 fL (ref 80.0–100.0)
MPV: 11.5 fL (ref 7.5–12.5)
Monocytes Relative: 10.3 %
Neutro Abs: 1677 {cells}/uL (ref 1500–7800)
Neutrophils Relative %: 43 %
Platelets: 235 10*3/uL (ref 140–400)
RBC: 3.92 10*6/uL (ref 3.80–5.10)
RDW: 14.1 % (ref 11.0–15.0)
Total Lymphocyte: 43.8 %
WBC: 3.9 10*3/uL (ref 3.8–10.8)

## 2023-05-16 LAB — COMPLETE METABOLIC PANEL WITH GFR
AG Ratio: 1.3 (calc) (ref 1.0–2.5)
ALT: 14 U/L (ref 6–29)
AST: 16 U/L (ref 10–35)
Albumin: 3.9 g/dL (ref 3.6–5.1)
Alkaline phosphatase (APISO): 50 U/L (ref 37–153)
BUN: 11 mg/dL (ref 7–25)
CO2: 29 mmol/L (ref 20–32)
Calcium: 8.8 mg/dL (ref 8.6–10.4)
Chloride: 105 mmol/L (ref 98–110)
Creat: 0.98 mg/dL (ref 0.50–1.03)
Globulin: 3 g/dL (ref 1.9–3.7)
Glucose, Bld: 91 mg/dL (ref 65–99)
Potassium: 3.5 mmol/L (ref 3.5–5.3)
Sodium: 140 mmol/L (ref 135–146)
Total Bilirubin: 0.4 mg/dL (ref 0.2–1.2)
Total Protein: 6.9 g/dL (ref 6.1–8.1)
eGFR: 70 mL/min/{1.73_m2} (ref >=60)

## 2023-05-16 LAB — RPR: RPR Ser Ql: NONREACTIVE

## 2023-05-25 ENCOUNTER — Encounter: Payer: Self-pay | Admitting: Pharmacist

## 2023-05-25 ENCOUNTER — Other Ambulatory Visit (HOSPITAL_COMMUNITY): Payer: Self-pay

## 2023-05-25 ENCOUNTER — Other Ambulatory Visit: Payer: Self-pay

## 2023-05-28 ENCOUNTER — Other Ambulatory Visit (HOSPITAL_COMMUNITY): Payer: Self-pay

## 2023-05-29 ENCOUNTER — Other Ambulatory Visit: Payer: Self-pay

## 2023-05-31 ENCOUNTER — Other Ambulatory Visit: Payer: Self-pay

## 2023-06-05 DIAGNOSIS — F99 Mental disorder, not otherwise specified: Secondary | ICD-10-CM | POA: Diagnosis not present

## 2023-06-07 ENCOUNTER — Other Ambulatory Visit: Payer: Self-pay

## 2023-06-07 ENCOUNTER — Emergency Department (HOSPITAL_BASED_OUTPATIENT_CLINIC_OR_DEPARTMENT_OTHER): Payer: 59 | Admitting: Radiology

## 2023-06-07 ENCOUNTER — Emergency Department (HOSPITAL_BASED_OUTPATIENT_CLINIC_OR_DEPARTMENT_OTHER)
Admission: EM | Admit: 2023-06-07 | Discharge: 2023-06-07 | Disposition: A | Discharge status: Home / Self Care | Payer: 59 | Attending: Emergency Medicine | Admitting: Emergency Medicine

## 2023-06-07 ENCOUNTER — Emergency Department (HOSPITAL_BASED_OUTPATIENT_CLINIC_OR_DEPARTMENT_OTHER): Payer: 59

## 2023-06-07 ENCOUNTER — Other Ambulatory Visit (HOSPITAL_BASED_OUTPATIENT_CLINIC_OR_DEPARTMENT_OTHER): Payer: Self-pay

## 2023-06-07 ENCOUNTER — Encounter (HOSPITAL_BASED_OUTPATIENT_CLINIC_OR_DEPARTMENT_OTHER): Payer: Self-pay | Admitting: Emergency Medicine

## 2023-06-07 DIAGNOSIS — M109 Gout, unspecified: Secondary | ICD-10-CM | POA: Diagnosis not present

## 2023-06-07 DIAGNOSIS — M25551 Pain in right hip: Secondary | ICD-10-CM | POA: Diagnosis present

## 2023-06-07 DIAGNOSIS — Z21 Asymptomatic human immunodeficiency virus [HIV] infection status: Secondary | ICD-10-CM | POA: Diagnosis not present

## 2023-06-07 DIAGNOSIS — Z79899 Other long term (current) drug therapy: Secondary | ICD-10-CM | POA: Insufficient documentation

## 2023-06-07 DIAGNOSIS — I10 Essential (primary) hypertension: Secondary | ICD-10-CM | POA: Diagnosis not present

## 2023-06-07 MED ORDER — KETOROLAC TROMETHAMINE 30 MG/ML IJ SOLN
30.0000 mg | Freq: Once | INTRAMUSCULAR | Status: AC
  Administered 2023-06-07: 30 mg via INTRAMUSCULAR
  Filled 2023-06-07: qty 1

## 2023-06-07 MED ORDER — CYCLOBENZAPRINE HCL 10 MG PO TABS
10.0000 mg | ORAL_TABLET | Freq: Two times a day (BID) | ORAL | 0 refills | Status: DC | PRN
  Filled 2023-06-07: qty 20, 10d supply, fill #0

## 2023-06-07 MED ORDER — PREDNISONE 20 MG PO TABS
40.0000 mg | ORAL_TABLET | Freq: Every day | ORAL | 0 refills | Status: AC
  Filled 2023-06-07: qty 12, 6d supply, fill #0

## 2023-06-07 MED ORDER — IBUPROFEN 600 MG PO TABS
600.0000 mg | ORAL_TABLET | Freq: Four times a day (QID) | ORAL | 0 refills | Status: DC | PRN
  Filled 2023-06-07: qty 30, 8d supply, fill #0

## 2023-06-07 NOTE — ED Triage Notes (Signed)
Pt with right foot/outside right toe red/swollen for 2 days. And now her right hip hurts. Pt also has cyst on her bottom and wants that examined

## 2023-06-07 NOTE — ED Provider Notes (Signed)
[place] EMERGENCY DEPARTMENT AT [facility] Provider Note   CSN: [id number] Arrival date & time: 06/07/23  8657     History  No chief complaint on file.   Dawn [name] is a 52 y.o. female.  HPI   52 year old female presents emergency department with complaints of right-sided hip pain as well as left foot pain.  Right-sided hip pain has been present intermittently for the past couple of years.  Patient reports flareup over the past several days.  Reports pain with movement of her right hip.  Denies any known trauma.  States that she has seen orthopedics in the outpatient setting and has upcoming appointment.  Has taken no medication for symptoms.  Also reports pain as well as redness and swelling at the base of her great toe on her left foot.  States that she has had intermittent "flareups" similar in the past without known diagnosis.  States that symptoms typically resolve on their own.  Left foot began to bother a couple days ago.  Patient also desiring examination of a cyst that she has had drain at the urgent care in the outpatient setting.  States that she is concerned because the "skin is not normal."  Denies any fever, chills, night sweats, weakness/sensory deficit lower extremities, saddle anesthesia, bowel/bladder dysfunction.  Past medical history significant for HIV with most recent CD4 count of 75 and HIV RNA quantitative less than 20 on 05/14/2023, hypertension, anemia  Home Medications Prior to Admission medications   Medication Sig Start Date End Date Taking? Authorizing Provider  predniSONE (DELTASONE) 20 MG tablet Take 2 tablets (40 mg total) by mouth daily with breakfast for 6 days. 06/07/23 06/13/23 Yes Sherian Maroon A, PA  albuterol (VENTOLIN HFA) 108 (90 Base) MCG/ACT inhaler Inhale 1 puff into the lungs every 6 (six) hours as needed for wheezing or shortness of breath. 06/14/21   Lamptey, Britta Mccreedy, MD  darunavir-cobicistat (PREZCOBIX) 800-150 MG tablet  TAKE ONE TABLET BY MOUTH DAILY WITH BREAKFAST *SWALLOW WHOLE. DO NOT CRUSH, BREAK OR CHEW TABLETS* 02/12/23   Judyann Munson, MD  dicyclomine (BENTYL) 20 MG tablet Take 1 tablet (20 mg total) by mouth 2 (two) times daily. Patient not taking: Reported on 12/05/2022 08/17/22   Raspet, Noberto Retort, PA-C  diphenhydrAMINE-zinc acetate (BENADRYL EXTRA STRENGTH) cream Apply 1 Application topically 3 (three) times daily as needed for itching. 01/25/23   Dartha Lodge, PA-C  doxycycline (VIBRA-TABS) 100 MG tablet Take 1 tablet (100 mg total) by mouth 2 (two) times daily. Take on full stomach 05/14/23   Judyann Munson, MD  emtricitabine-tenofovir AF (DESCOVY) 200-25 MG tablet Take 1 tablet by mouth daily. 02/12/23   Judyann Munson, MD  erythromycin ophthalmic ointment Place a 1/2 inch ribbon of ointment into the lower eyelid. Patient not taking: Reported on 05/14/2023 11/16/22   Garrison, [place] N, FNP  ferrous sulfate 325 (65 FE) MG tablet Take 1 tablet (325 mg total) by mouth daily. 09/04/20   Petrucelli, Samantha R, PA-C  FLUoxetine (PROZAC) 20 MG capsule Take 1 capsule (20 mg total) by mouth daily. 03/15/23   Judyann Munson, MD  hydrochlorothiazide (HYDRODIURIL) 25 MG tablet Take 0.5 tablets (12.5 mg total) by mouth daily. 05/14/23   Judyann Munson, MD  ondansetron (ZOFRAN-ODT) 4 MG disintegrating tablet Dissolve 1 tablet under the tongue every 8 (eight) hours as needed for nausea or vomiting. Patient not taking: Reported on 05/14/2023 12/14/22   Tegeler, Canary Brim, MD  rosuvastatin (CRESTOR) 10 MG tablet Take 1 tablet (  10 mg total) by mouth daily. Patient not taking: Reported on 05/14/2023 02/12/23   Judyann Munson, MD  tretinoin (RETIN-A) 0.05 % cream Apply topically at bedtime. Patient not taking: Reported on 05/14/2023 11/30/22   Lockie Mola, MD  triamcinolone (KENALOG) 0.025 % ointment Apply 1 Application topically 2 (two) times daily as needed (apply to affected area). Patient not taking: Reported on  05/14/2023 01/23/23   Bing Neighbors, NP  valACYclovir (VALTREX) 1000 MG tablet Take 1 tablet (1,000 mg total) by mouth daily. 02/12/23   Judyann Munson, MD  diphenhydrAMINE (BENADRYL) 25 MG tablet Take 1 tablet (25 mg total) by mouth every 6 (six) hours as needed for up to 3 days. 06/17/20 09/04/20  Orvil Feil, PA-C  famotidine (PEPCID) 20 MG tablet Take 1 tablet (20 mg total) by mouth 2 (two) times daily for 5 days. 06/17/20 09/04/20  Orvil Feil, PA-C      Allergies    Chocolate and Tomato    Review of Systems   Review of Systems  All other systems reviewed and are negative.   Physical Exam Updated Vital Signs BP (!) 131/90   Pulse 92   Temp 98.5 F (36.9 C) (Oral)   Resp 16   SpO2 99%  Physical Exam Vitals and nursing note reviewed.  Constitutional:      General: She is not in acute distress.    Appearance: She is well-developed.  HENT:     Head: Normocephalic and atraumatic.  Eyes:     Conjunctiva/sclera: Conjunctivae normal.  Cardiovascular:     Rate and Rhythm: Normal rate and regular rhythm.     Heart sounds: No murmur heard. Pulmonary:     Effort: Pulmonary effort is normal. No respiratory distress.     Breath sounds: Normal breath sounds.  Abdominal:     Palpations: Abdomen is soft.     Tenderness: There is no abdominal tenderness.  Musculoskeletal:        General: No swelling.     Cervical back: Neck supple.     Comments: No midline tenderness of cervical, thoracic, lumbar spine without step-off or deformity noted.  Paraspinal tenderness in the right lower lumbar region as well as tenderness of right buttock.  Tender to palpation of right proximal hip.  Full range of motion of bilateral hips, knees, ankles, digits.  Muscular strength 5 out of 5 for lower extremities.  Straight leg raise negative bilaterally.  Pedal pulses 2+ bilaterally.  Patient with erythematous, swollen MTP joint of left first digit.  Pain with ranging left great toe.  Skin:     General: Skin is warm and dry.     Capillary Refill: Capillary refill takes less than 2 seconds.  Neurological:     Mental Status: She is alert.  Psychiatric:        Mood and Affect: Mood normal.     ED Results / Procedures / Treatments   Labs (all labs ordered are listed, but only abnormal results are displayed) Labs Reviewed - No data to display  EKG None  Radiology DG Foot Complete Left  Result Date: 06/07/2023 CLINICAL DATA:  Two day history of right foot/toe swelling and redness EXAM: LEFT FOOT - COMPLETE 3 VIEW COMPARISON:  None Available. FINDINGS: There is no evidence of fracture or dislocation. Ill-defined calcification projecting medial to the first metatarsal phalangeal joint. Soft tissue swelling of the right foot at the lateral aspect of the fifth metatarsophalangeal joint. IMPRESSION: 1. Soft tissue swelling of the  right foot at the lateral aspect of the fifth metatarsophalangeal joint. No acute fracture or dislocation. 2. Ill-defined calcification projecting medial to the first metatarsophalangeal joint, which may represent acute calcific periarthritis. Electronically Signed   By: Agustin Cree M.D.   On: 06/07/2023 11:12   DG Hip Unilat W or Wo Pelvis 2-3 Views Right  Result Date: 06/07/2023 CLINICAL DATA:  Right hip pain EXAM: DG HIP (WITH OR WITHOUT PELVIS) 3V RIGHT COMPARISON:  Right hip radiographs dated 06/28/2022 FINDINGS: There is no evidence of hip fracture or dislocation. Mild degenerative changes of the bilateral hips. IMPRESSION: Mild degenerative changes of the bilateral hips. Electronically Signed   By: Agustin Cree M.D.   On: 06/07/2023 11:07    Procedures Procedures    Medications Ordered in ED Medications  ketorolac (TORADOL) 30 MG/ML injection 30 mg (30 mg Intramuscular Given 06/07/23 0940)    ED Course/ Medical Decision Making/ A&P                                 Medical Decision Making Amount and/or Complexity of Data Reviewed Radiology:  ordered.  Risk Prescription drug management.   This patient presents to the ED for concern of toe pain, right hip pain, this involves an extensive number of treatment options, and is a complaint that carries with it a high risk of complications and morbidity.  The differential diagnosis includes fracture, strain/pain, dislocation, OA, septic arthritis, ischemic limb, DVT, gout, cellulitis, erysipelas, other   Co morbidities that complicate the patient evaluation  See HPI   Additional history obtained:  Additional history obtained from EMR External records from outside source obtained and reviewed including hospital records   Lab Tests:  N/a   Imaging Studies ordered:  I ordered imaging studies including pelvis with right hip x-ray, left foot x-ray I independently visualized and interpreted imaging which showed  Pelvis with right hip x-ray: Mild degenerative changes of bilateral hips Left foot x-ray: Soft swelling.  No acute osseous abnormality.  Calcific periarthritis. I agree with the radiologist interpretation   Cardiac Monitoring: / EKG:  The patient was maintained on a cardiac monitor.  I personally viewed and interpreted the cardiac monitored which showed an underlying rhythm of: Sinus rhythm   Consultations Obtained:  N/a   Problem List / ED Course / Critical interventions / Medication management  Right hip pain, podagra I ordered medication including Toradol   Reevaluation of the patient after these medicines showed that the patient improved I have reviewed the patients home medicines and have made adjustments as needed   Social Determinants of Health:  Former cigarette use.  Denies illicit drug use.   Test / Admission - Considered:  Right hip pain, podagra Vitals signs significant for hypertension blood pressure 160/92. Otherwise within normal range and stable throughout visit. Imaging studies significant for: See above 52 year old female presents  emergency department with complaints of right-sided hip pain as well as left foot pain.  On exam, patient with tenderness in right paraspinal muscles in lumbar region as well as right buttock and right proximal femur.  No definite signs for back pain on HPI/physical exam.  Pain seems to be more isolated to right buttock/hip more so than low back.  X-rays that were obtained which were negative for any acute osseous abnormality but with OA appreciated bilateral hips.  Patient without pulse deficits concerning for ischemic limb.  No lower extremity swelling concerning for  DVT.  No overlying skin changes of right hip concerning for secondary infectious process.  Regarding left foot, patient with erythematous and swollen left MTP joint of first digit.  Given clinical presentation and history of similar recurrent symptoms in the past that have resolved spontaneously, most consistent with gout.  Low suspicion for septic arthritis, cellulitis.  X-ray obtained which was negative for any acute osseous abnormality.  Given patient's history of HIV with current use of prezcobix/Prozac, will refrain from use of  colchicine or other NSAIDs.  Will recommend use of prednisone in the outpatient setting, dietary changes and follow-up with primary care in the outpatient setting.  Treatment plan discussed at length with patient and she acknowledged understanding was agreeable to said plan.  Patient overall well-appearing, afebrile in no acute distress. Worrisome signs and symptoms were discussed with the patient, and the patient acknowledged understanding to return to the ED if noticed. Patient was stable upon discharge.          Final Clinical Impression(s) / ED Diagnoses Final diagnoses:  Right hip pain  Podagra    Rx / DC Orders ED Discharge Orders          Ordered    ibuprofen (ADVIL) 600 MG tablet  Every 6 hours PRN,   Status:  Discontinued        06/07/23 1113    predniSONE (DELTASONE) 20 MG tablet  Daily  with breakfast        06/07/23 1114              Peter Garter, [place] 06/07/23 1116    Eber Hong, MD 06/15/23 [phone]

## 2023-06-07 NOTE — Discharge Instructions (Addendum)
As discussed, workup today overall reassuring.  Right hip did not show signs of fracture or dislocation.  I suspect your symptoms are likely secondary to muscular injury.  Will treat with prednisone as well as muscle laxer to use as needed.  Regarding left foot, symptoms most consistent with gout.  Will attach information for low purine diet.  The medications we are treating your right hip before will also treat pain/inflammation associated with gout.  Recommend follow-up with primary care for reassessment of your symptoms.  Please do not hesitate to return to the emergency department for worrisome signs and symptoms we discussed become apparent.

## 2023-06-12 ENCOUNTER — Other Ambulatory Visit: Payer: Self-pay

## 2023-06-20 ENCOUNTER — Other Ambulatory Visit (HOSPITAL_COMMUNITY): Payer: Self-pay

## 2023-06-20 ENCOUNTER — Other Ambulatory Visit (HOSPITAL_COMMUNITY): Payer: Self-pay | Admitting: Pharmacy Technician

## 2023-06-20 ENCOUNTER — Other Ambulatory Visit: Payer: Self-pay

## 2023-06-20 NOTE — Progress Notes (Signed)
Specialty Pharmacy Refill Coordination Note  Dawn [name] is a 52 y.o. female contacted today regarding refills of specialty medication(s) Darunavir-Cobicistat; Emtricitabine-Tenofovir Af   Patient requested Delivery   Delivery date: 06/22/23   Verified address: [address]  [name]   Medication will be filled on 06/20/23 or 06/21/23.

## 2023-07-02 ENCOUNTER — Other Ambulatory Visit (HOSPITAL_COMMUNITY): Payer: Self-pay

## 2023-07-02 ENCOUNTER — Other Ambulatory Visit: Payer: Self-pay

## 2023-07-04 DIAGNOSIS — F99 Mental disorder, not otherwise specified: Secondary | ICD-10-CM | POA: Diagnosis not present

## 2023-07-09 ENCOUNTER — Other Ambulatory Visit (HOSPITAL_COMMUNITY): Payer: Self-pay

## 2023-07-09 MED ORDER — MELOXICAM 15 MG PO TABS
15.0000 mg | ORAL_TABLET | Freq: Every day | ORAL | 2 refills | Status: DC | PRN
  Filled 2023-07-09: qty 30, 30d supply, fill #0
  Filled 2023-07-31: qty 30, 30d supply, fill #1

## 2023-07-10 ENCOUNTER — Other Ambulatory Visit: Payer: Self-pay

## 2023-07-13 ENCOUNTER — Other Ambulatory Visit: Payer: Self-pay

## 2023-07-16 ENCOUNTER — Other Ambulatory Visit: Payer: Self-pay

## 2023-07-31 ENCOUNTER — Other Ambulatory Visit (HOSPITAL_COMMUNITY): Payer: Self-pay | Admitting: Emergency Medicine

## 2023-07-31 ENCOUNTER — Other Ambulatory Visit (HOSPITAL_COMMUNITY): Payer: Self-pay

## 2023-07-31 ENCOUNTER — Other Ambulatory Visit: Payer: Self-pay

## 2023-07-31 ENCOUNTER — Telehealth: Payer: Self-pay

## 2023-07-31 NOTE — Progress Notes (Signed)
Specialty Pharmacy Ongoing Clinical Assessment Note  Dawn [name] is a 52 y.o. female who is being followed by the specialty pharmacy service for RxSp HIV   Patient's specialty medication(s) reviewed today: Darunavir-Cobicistat; Emtricitabine-Tenofovir Af   Missed doses in the last 4 weeks: 9 (pt is unsure of how many she missed)   Patient/Caregiver did not have any additional questions or concerns.   Therapeutic benefit summary: Patient is achieving benefit   Adverse events/side effects summary: Experienced adverse events/side effects (nausea, tolerable)   Patient's therapy is appropriate to: Continue    Goals Addressed             This Visit's Progress    Achieve Undetectable HIV Viral Load < 20       Patient is on track. Patient will maintain adherence. Last viral load was <20 copies/mL (05/14/23).          Follow up:  6 months  Servando Snare Specialty Pharmacist

## 2023-07-31 NOTE — Progress Notes (Signed)
Specialty Pharmacy Refill Coordination Note  Dawn [name] is a 52 y.o. female contacted today regarding refills of specialty medication(s) Darunavir-Cobicistat; Emtricitabine-Tenofovir Af   Patient requested Delivery   Delivery date: 08/02/23   Verified address: [address] [place] [address]   Medication will be filled on 08/01/23. *Pending Prior Authorization*

## 2023-07-31 NOTE — Telephone Encounter (Signed)
[facility] Patient Advocate Encounter   Received notification from [facility] Jari Favre) that prior authorization for Descovy is required.   PA submitted on 07/31/23 Key [id number] Status is pending    [facility] will continue to follow.   Clearance Coots, CPhT Specialty Pharmacy Patient [facility] Phone: [phone] Fax:  [phone]

## 2023-08-01 ENCOUNTER — Other Ambulatory Visit: Payer: Self-pay

## 2023-08-01 ENCOUNTER — Other Ambulatory Visit (HOSPITAL_COMMUNITY): Payer: Self-pay

## 2023-08-01 DIAGNOSIS — F99 Mental disorder, not otherwise specified: Secondary | ICD-10-CM | POA: Diagnosis not present

## 2023-08-02 ENCOUNTER — Other Ambulatory Visit: Payer: Self-pay

## 2023-08-02 ENCOUNTER — Other Ambulatory Visit (HOSPITAL_COMMUNITY): Payer: Self-pay

## 2023-08-02 DIAGNOSIS — B20 Human immunodeficiency virus [HIV] disease: Secondary | ICD-10-CM

## 2023-08-02 MED ORDER — DESCOVY 200-25 MG PO TABS
1.0000 | ORAL_TABLET | Freq: Every day | ORAL | 4 refills | Status: DC
  Filled 2023-08-02 – 2023-08-03 (×4): qty 30, 30d supply, fill #0

## 2023-08-02 MED ORDER — PREZCOBIX 800-150 MG PO TABS
ORAL_TABLET | ORAL | 4 refills | Status: DC
  Filled 2023-08-02 (×3): qty 30, 30d supply, fill #0

## 2023-08-02 NOTE — Progress Notes (Signed)
Specialty Pharmacy Refill Coordination Note  Dawn [name] is a 52 y.o. female contacted today regarding refills of specialty medication(s) Darunavir-Cobicistat; Emtricitabine-Tenofovir Af   Patient requested Delivery   Delivery date: 08/06/23   Verified address: [address] [place] [address]   Medication will be filled on 08/03/23.

## 2023-08-03 ENCOUNTER — Other Ambulatory Visit: Payer: Self-pay

## 2023-08-03 ENCOUNTER — Other Ambulatory Visit (HOSPITAL_COMMUNITY): Payer: Self-pay

## 2023-08-03 NOTE — Telephone Encounter (Signed)
Pharmacy Patient Advocate Encounter  Received notification from [facility] Jari Favre) that Prior Authorization for DESCOVY has been APPROVED from 08/02/23 to 08/01/24. Ran test claim, Copay is $0. This test claim was processed through [facility]- copay amounts may vary at other pharmacies due to pharmacy/plan contracts, or as the patient moves through the different stages of their insurance plan.   PA #/Case ID/Reference #: [id number]

## 2023-08-03 NOTE — Progress Notes (Signed)
08/03/23  *Same Day Courier - STAT*  Confirmation Number: [id number]

## 2023-08-04 NOTE — Telephone Encounter (Signed)
Prescription was for when patient had GI symptoms 7 months ago and was evaluated by me in the emergency department.  Patient can follow-up with her PCP or get reevaluated before getting a new prescription for medication.

## 2023-08-06 ENCOUNTER — Other Ambulatory Visit: Payer: Self-pay

## 2023-08-09 ENCOUNTER — Telehealth: Payer: Self-pay

## 2023-08-09 ENCOUNTER — Other Ambulatory Visit (HOSPITAL_COMMUNITY): Payer: Self-pay

## 2023-08-09 NOTE — Telephone Encounter (Signed)
[facility] Patient Advocate Encounter  Prior Authorization for Descovy has been approved.    PA# [id number] Effective dates: 08/03/23 through 07/31/24  Patients co-pay is $0.00.   [facility] will continue to follow.  Clearance Coots, CPhT Specialty Pharmacy Patient [facility] Phone: [phone] Fax:  [phone]

## 2023-08-15 ENCOUNTER — Emergency Department (HOSPITAL_COMMUNITY)
Admission: EM | Admit: 2023-08-15 | Discharge: 2023-08-15 | Discharge status: Against Medical Advice | Payer: 59 | Attending: Obstetrics & Gynecology | Admitting: Obstetrics & Gynecology

## 2023-08-15 ENCOUNTER — Encounter (HOSPITAL_COMMUNITY): Payer: Self-pay

## 2023-08-15 ENCOUNTER — Other Ambulatory Visit: Payer: Self-pay

## 2023-08-15 ENCOUNTER — Inpatient Hospital Stay (HOSPITAL_COMMUNITY)
Admission: AD | Admit: 2023-08-15 | Discharge: 2023-08-15 | Disposition: A | Discharge status: Home / Self Care | Payer: 59 | Source: Home / Self Care | Attending: Obstetrics & Gynecology | Admitting: Obstetrics & Gynecology

## 2023-08-15 DIAGNOSIS — M549 Dorsalgia, unspecified: Secondary | ICD-10-CM | POA: Insufficient documentation

## 2023-08-15 DIAGNOSIS — Z5982 Transportation insecurity: Secondary | ICD-10-CM | POA: Insufficient documentation

## 2023-08-15 DIAGNOSIS — Z5321 Procedure and treatment not carried out due to patient leaving prior to being seen by health care provider: Secondary | ICD-10-CM | POA: Diagnosis not present

## 2023-08-15 DIAGNOSIS — Z789 Other specified health status: Secondary | ICD-10-CM

## 2023-08-15 DIAGNOSIS — R109 Unspecified abdominal pain: Secondary | ICD-10-CM | POA: Diagnosis present

## 2023-08-15 DIAGNOSIS — Z3202 Encounter for pregnancy test, result negative: Secondary | ICD-10-CM | POA: Insufficient documentation

## 2023-08-15 DIAGNOSIS — G8929 Other chronic pain: Secondary | ICD-10-CM | POA: Insufficient documentation

## 2023-08-15 DIAGNOSIS — Z21 Asymptomatic human immunodeficiency virus [HIV] infection status: Secondary | ICD-10-CM | POA: Diagnosis not present

## 2023-08-15 DIAGNOSIS — R112 Nausea with vomiting, unspecified: Secondary | ICD-10-CM | POA: Diagnosis not present

## 2023-08-15 LAB — T-HELPER CELLS (CD4) COUNT (NOT AT ARMC)
CD4 % Helper T Cell: 44 % (ref 33–65)
CD4 T Cell Abs: 541 /uL (ref 400–1790)

## 2023-08-15 LAB — CBC WITH DIFFERENTIAL/PLATELET
Abs Immature Granulocytes: 0 10*3/uL (ref 0.00–0.07)
Basophils Absolute: 0 10*3/uL (ref 0.0–0.1)
Basophils Relative: 1 %
Eosinophils Absolute: 0.1 10*3/uL (ref 0.0–0.5)
Eosinophils Relative: 1 %
HCT: 34.6 % — ABNORMAL LOW (ref 36.0–46.0)
Hemoglobin: 11 g/dL — ABNORMAL LOW (ref 12.0–15.0)
Immature Granulocytes: 0 %
Lymphocytes Relative: 36 %
Lymphs Abs: 1.4 10*3/uL (ref 0.7–4.0)
MCH: 27.1 pg (ref 26.0–34.0)
MCHC: 31.8 g/dL (ref 30.0–36.0)
MCV: 85.2 fL (ref 80.0–100.0)
Monocytes Absolute: 0.4 10*3/uL (ref 0.1–1.0)
Monocytes Relative: 11 %
Neutro Abs: 2 10*3/uL (ref 1.7–7.7)
Neutrophils Relative %: 51 %
Platelets: 276 10*3/uL (ref 150–400)
RBC: 4.06 MIL/uL (ref 3.87–5.11)
RDW: 15.4 % (ref 11.5–15.5)
WBC: 3.8 10*3/uL — ABNORMAL LOW (ref 4.0–10.5)
nRBC: 0 % (ref 0.0–0.2)

## 2023-08-15 LAB — URINALYSIS, ROUTINE W REFLEX MICROSCOPIC
Bilirubin Urine: NEGATIVE
Glucose, UA: NEGATIVE mg/dL
Hgb urine dipstick: NEGATIVE
Ketones, ur: NEGATIVE mg/dL
Leukocytes,Ua: NEGATIVE
Nitrite: NEGATIVE
Protein, ur: NEGATIVE mg/dL
Specific Gravity, Urine: 1.015 (ref 1.005–1.030)
pH: 6 (ref 5.0–8.0)

## 2023-08-15 LAB — COMPREHENSIVE METABOLIC PANEL
ALT: 18 U/L (ref 0–44)
AST: 21 U/L (ref 15–41)
Albumin: 3.8 g/dL (ref 3.5–5.0)
Alkaline Phosphatase: 52 U/L (ref 38–126)
Anion gap: 9 (ref 5–15)
BUN: 8 mg/dL (ref 6–20)
CO2: 25 mmol/L (ref 22–32)
Calcium: 9.2 mg/dL (ref 8.9–10.3)
Chloride: 102 mmol/L (ref 98–111)
Creatinine, Ser: 1.05 mg/dL — ABNORMAL HIGH (ref 0.44–1.00)
GFR, Estimated: >60 mL/min (ref >60)
Glucose, Bld: 97 mg/dL (ref 70–99)
Potassium: 3.1 mmol/L — ABNORMAL LOW (ref 3.5–5.1)
Sodium: 136 mmol/L (ref 135–145)
Total Bilirubin: 0.7 mg/dL (ref <1.2)
Total Protein: 7.4 g/dL (ref 6.5–8.1)

## 2023-08-15 LAB — LIPASE, BLOOD: Lipase: 47 U/L (ref 11–51)

## 2023-08-15 LAB — POCT PREGNANCY, URINE: Preg Test, Ur: NEGATIVE

## 2023-08-15 NOTE — ED Notes (Signed)
Pt decided to leave due to the wait and will come back tomorrow.

## 2023-08-15 NOTE — ED Triage Notes (Signed)
Pt came to ED for "feeling sick all over body", n/v/d. Pt states she can not walk/ chills. Compliant with HIV meds.

## 2023-08-15 NOTE — MAU Provider Note (Signed)
Chief Complaint: Back Pain and Possible Pregnancy   Event Date/Time   First Provider Initiated Contact with Patient 08/15/23 0950      SUBJECTIVE HPI: Dawn [name] is a 52 y.o. [id number] presents for concern for possible pregnancy. Multiple concerns.  States she currently feels overall well. Does desire pregnancy. Is not currently on any contraception and is having cycles. Has PCP appointment Thursday.  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   Fibroid    Headache(784.0)    HIV (human immunodeficiency virus infection) (HCC)    Infection    UTI   Vaginal discharge 08/14/2022   Past Surgical History:  Procedure Laterality Date   CESAREAN SECTION     DILATION AND CURETTAGE OF UTERUS     HAND TENDON SURGERY     right thumb   INDUCED ABORTION     TUBAL LIGATION     Social History   Socioeconomic History   Marital status: Single    Spouse name: Not on file   Number of children: Not on file   Years of education: Not on file   Highest education level: Not on file  Occupational History   Not on file  Tobacco Use   Smoking status: Former    Types: Cigarettes    Passive exposure: Never   Smokeless tobacco: Never  Vaping Use   Vaping status: Never Used  Substance and Sexual Activity   Alcohol use: Yes    Comment: hx of alcohol abuse- social drinker now   Drug use: Not Currently    Types: Marijuana    Comment: none in 8 yrs   Sexual activity: Yes    Birth control/protection: Surgical, Condom    Comment: accepted condoms  Other Topics Concern   Not on file  Social History Narrative   Not on file   Social Drivers of Health   Financial Resource Strain: Not on file  Food Insecurity: Food Insecurity Present (11/21/2021)   Hunger Vital Sign    Worried About Running Out of Food in the Last Year: Sometimes true    Ran Out of Food in the Last Year: Often true  Transportation Needs: Unmet Transportation Needs  (11/21/2021)   PRAPARE - Administrator, Civil Service (Medical): Yes    Lack of Transportation (Non-Medical): Yes  Physical Activity: Not on file  Stress: Not on file  Social Connections: Not on file  Intimate Partner Violence: Not on file   No current facility-administered medications on file prior to encounter.   Current Outpatient Medications on File Prior to Encounter  Medication Sig Dispense Refill   albuterol (VENTOLIN HFA) 108 (90 Base) MCG/ACT inhaler Inhale 1 puff into the lungs every 6 (six) hours as needed for wheezing or shortness of breath. 18 g 0   cyclobenzaprine (FLEXERIL) 10 MG tablet Take 1 tablet (10 mg total) by mouth 2 (two) times daily as needed for muscle spasms. 20 tablet 0   darunavir-cobicistat (PREZCOBIX) 800-150 MG tablet TAKE ONE TABLET BY MOUTH DAILY WITH BREAKFAST *SWALLOW WHOLE. DO NOT CRUSH, BREAK OR CHEW TABLETS* 30 tablet 4   dicyclomine (BENTYL) 20 MG tablet Take 1 tablet (20 mg total) by mouth 2 (two) times daily. (Patient not taking: Reported on 12/05/2022) 10 tablet 0   diphenhydrAMINE-zinc acetate (BENADRYL EXTRA STRENGTH) cream Apply 1 Application topically 3 (three) times daily as needed for itching. 28.4 g 0   doxycycline (VIBRA-TABS)  100 MG tablet Take 1 tablet (100 mg total) by mouth 2 (two) times daily. Take on full stomach 10 tablet 0   emtricitabine-tenofovir AF (DESCOVY) 200-25 MG tablet Take 1 tablet by mouth daily. 30 tablet 4   erythromycin ophthalmic ointment Place a 1/2 inch ribbon of ointment into the lower eyelid. (Patient not taking: Reported on 05/14/2023) 3.5 g 0   ferrous sulfate 325 (65 FE) MG tablet Take 1 tablet (325 mg total) by mouth daily. 30 tablet 0   FLUoxetine (PROZAC) 20 MG capsule Take 1 capsule (20 mg total) by mouth daily. 30 capsule 3   hydrochlorothiazide (HYDRODIURIL) 25 MG tablet Take 0.5 tablets (12.5 mg total) by mouth daily. 30 tablet 6   meloxicam (MOBIC) 15 MG tablet Take 1 tablet (15 mg) by mouth once  a day as needed for pain and swelling. DO NOT TAKE AT THE SAME TIME AS IBUPROFEN OR CELEBREX 30 tablet 2   ondansetron (ZOFRAN-ODT) 4 MG disintegrating tablet Dissolve 1 tablet under the tongue every 8 (eight) hours as needed for nausea or vomiting. (Patient not taking: Reported on 05/14/2023) 20 tablet 0   rosuvastatin (CRESTOR) 10 MG tablet Take 1 tablet (10 mg total) by mouth daily. (Patient not taking: Reported on 05/14/2023) 30 tablet 11   tretinoin (RETIN-A) 0.05 % cream Apply topically at bedtime. (Patient not taking: Reported on 05/14/2023) 45 each 0   triamcinolone (KENALOG) 0.025 % ointment Apply 1 Application topically 2 (two) times daily as needed (apply to affected area). (Patient not taking: Reported on 05/14/2023) 160 g 0   valACYclovir (VALTREX) 1000 MG tablet Take 1 tablet (1,000 mg total) by mouth daily. 30 tablet 5   [DISCONTINUED] diphenhydrAMINE (BENADRYL) 25 MG tablet Take 1 tablet (25 mg total) by mouth every 6 (six) hours as needed for up to 3 days. 12 tablet 0   [DISCONTINUED] famotidine (PEPCID) 20 MG tablet Take 1 tablet (20 mg total) by mouth 2 (two) times daily for 5 days. 30 tablet 0   Allergies  Allergen Reactions   Chocolate Hives   Tomato Hives    ROS:  Pertinent positives/negatives listed above.  I have reviewed patient's Past Medical Hx, Surgical Hx, Family Hx, Social Hx, medications and allergies.   Physical Exam  Patient Vitals for the past 24 hrs:  BP Temp Temp src Pulse Resp SpO2 Height Weight  08/15/23 0936 (!) 145/88 98.1 F (36.7 C) Oral 83 17 100 % 5\' 4"  (1.626 m) 73.7 kg   Constitutional: Well-developed, well-nourished female in no acute distress.  Cardiovascular: normal rate Respiratory: normal effort GI: Abd soft, non-tender. Pos BS x 4 MS: Extremities nontender, no edema, normal ROM Neurologic: Alert and oriented x 4.   LAB RESULTS Results for orders placed or performed during the hospital encounter of 08/15/23 (from the past 24 hours)   Pregnancy, urine POC     Status: None   Collection Time: 08/15/23  9:42 AM  Result Value Ref Range   Preg Test, Ur NEGATIVE NEGATIVE       IMAGING No results found.  MAU Management/MDM: Orders Placed This Encounter  Procedures   Pregnancy, urine POC   Discharge patient    No orders of the defined types were placed in this encounter.   Patient with multiple concerns today. Not pregnant. Has a primary care appointment Thursday. No emergent obstetric concerns today. I encouraged her to keep PCP appointment and bring a list of concerns.  ASSESSMENT 1. Not currently pregnant  PLAN Discharge home with strict return precautions. Allergies as of 08/15/2023       Reactions   Chocolate Hives   Tomato Hives        Medication List     TAKE these medications    albuterol 108 (90 Base) MCG/ACT inhaler Commonly known as: VENTOLIN HFA Inhale 1 puff into the lungs every 6 (six) hours as needed for wheezing or shortness of breath.   cyclobenzaprine 10 MG tablet Commonly known as: FLEXERIL Take 1 tablet (10 mg total) by mouth 2 (two) times daily as needed for muscle spasms.   Descovy 200-25 MG tablet Generic drug: emtricitabine-tenofovir AF Take 1 tablet by mouth daily.   dicyclomine 20 MG tablet Commonly known as: BENTYL Take 1 tablet (20 mg total) by mouth 2 (two) times daily.   diphenhydrAMINE-zinc acetate cream Commonly known as: Benadryl Extra Strength Apply 1 Application topically 3 (three) times daily as needed for itching.   doxycycline 100 MG tablet Commonly known as: VIBRA-TABS Take 1 tablet (100 mg total) by mouth 2 (two) times daily. Take on full stomach   erythromycin ophthalmic ointment Place a 1/2 inch ribbon of ointment into the lower eyelid.   ferrous sulfate 325 (65 FE) MG tablet Take 1 tablet (325 mg total) by mouth daily.   FLUoxetine 20 MG capsule Commonly known as: PROZAC Take 1 capsule (20 mg total) by mouth daily.    hydrochlorothiazide 25 MG tablet Commonly known as: HYDRODIURIL Take 0.5 tablets (12.5 mg total) by mouth daily.   meloxicam 15 MG tablet Commonly known as: MOBIC Take 1 tablet (15 mg) by mouth once a day as needed for pain and swelling. DO NOT TAKE AT THE SAME TIME AS IBUPROFEN OR CELEBREX   ondansetron 4 MG disintegrating tablet Commonly known as: ZOFRAN-ODT Dissolve 1 tablet under the tongue every 8 (eight) hours as needed for nausea or vomiting.   Prezcobix 800-150 MG tablet Generic drug: darunavir-cobicistat TAKE ONE TABLET BY MOUTH DAILY WITH BREAKFAST *SWALLOW WHOLE. DO NOT CRUSH, BREAK OR CHEW TABLETS*   rosuvastatin 10 MG tablet Commonly known as: Crestor Take 1 tablet (10 mg total) by mouth daily.   tretinoin 0.05 % cream Commonly known as: RETIN-A Apply topically at bedtime.   triamcinolone 0.025 % ointment Commonly known as: KENALOG Apply 1 Application topically 2 (two) times daily as needed (apply to affected area).   valACYclovir 1000 MG tablet Commonly known as: VALTREX Take 1 tablet (1,000 mg total) by mouth daily.         Wylene Simmer, MD OB Fellow 08/15/2023  10:08 AM

## 2023-08-15 NOTE — MAU Note (Addendum)
Dawn [name] is a 52 y.o. at Unknown here in MAU reporting: back been hurting, can't walk too many places because she is wobbly.  Has been peeing a lot.  ? Exposure in Aug, condom broke.  No bleeding, no d/c or irritation LMP: first week of November, lasted 7 days. Says she got a depo shot in June she thinks, reports neg preg test then, though she thought she was already preg. Last preg test in chart was in May.  Onset of complaint: ongoing Pain score: 7 Vitals:   08/15/23 0936  BP: (!) 145/88  Pulse: 83  Resp: 17  Temp: 98.1 F (36.7 C)  SpO2: 100%      Lab orders placed from triage:  UPT   Has appt on Thur

## 2023-08-15 NOTE — ED Provider Triage Note (Signed)
Emergency Medicine Provider Triage Evaluation Note  Dawn [name] , a 52 y.o. female  was evaluated in triage.  Pt complains of chronic abdominal pain, nausea, vomiting, bloating.  This been ongoing for 5 months.  Patient also states that she has been having an outbreak of her herpes.  She also states she has been taking her HIV medicine.  Review of Systems  Positive:  Negative: See above   Physical Exam  LMP 07/01/2023 (Approximate)  Gen:   Awake, no distress   Resp:  Normal effort  MSK:   Moves extremities without difficulty  Other:    Medical Decision Making  Medically screening exam initiated at 12:31 PM.  Appropriate orders placed.  Dawn [name] was informed that the remainder of the evaluation will be completed by another provider, this initial triage assessment does not replace that evaluation, and the importance of remaining in the ED until their evaluation is complete.     Honor Loh [place], [place] 08/15/23 1233

## 2023-08-16 ENCOUNTER — Other Ambulatory Visit (HOSPITAL_COMMUNITY): Payer: Self-pay

## 2023-08-16 ENCOUNTER — Other Ambulatory Visit: Payer: Self-pay

## 2023-08-16 ENCOUNTER — Encounter: Payer: Self-pay | Admitting: Internal Medicine

## 2023-08-16 ENCOUNTER — Ambulatory Visit (INDEPENDENT_AMBULATORY_CARE_PROVIDER_SITE_OTHER): Payer: 59 | Admitting: Internal Medicine

## 2023-08-16 VITALS — BP 127/83 | HR 82 | Temp 97.3°F | Ht 64.0 in | Wt 169.0 lb

## 2023-08-16 DIAGNOSIS — K219 Gastro-esophageal reflux disease without esophagitis: Secondary | ICD-10-CM | POA: Diagnosis not present

## 2023-08-16 DIAGNOSIS — B20 Human immunodeficiency virus [HIV] disease: Secondary | ICD-10-CM

## 2023-08-16 MED ORDER — DESCOVY 200-25 MG PO TABS
1.0000 | ORAL_TABLET | Freq: Every day | ORAL | 4 refills | Status: DC
  Filled 2023-08-16 – 2023-08-21 (×2): qty 30, 30d supply, fill #0
  Filled 2023-09-25: qty 30, 30d supply, fill #1
  Filled 2023-10-31: qty 30, 30d supply, fill #2
  Filled 2023-11-30: qty 30, 30d supply, fill #3
  Filled 2023-12-24 (×2): qty 30, 30d supply, fill #4

## 2023-08-16 MED ORDER — PREZCOBIX 800-150 MG PO TABS
ORAL_TABLET | ORAL | 4 refills | Status: DC
  Filled 2023-08-16: qty 30, fill #0
  Filled 2023-08-21: qty 30, 30d supply, fill #0
  Filled 2023-09-25: qty 30, 30d supply, fill #1
  Filled 2023-10-31: qty 30, 30d supply, fill #2
  Filled 2023-11-30: qty 30, 30d supply, fill #3
  Filled 2023-12-24 (×2): qty 30, 30d supply, fill #4

## 2023-08-16 NOTE — Progress Notes (Signed)
Patient ID: Dawn [name], female   DOB: Jul 17, 1971, 52 y.o.   MRN: [id number]  HPI The patient, diagnosed with HIV, reports non-adherence to her antiretroviral therapy, missing approximately four consecutive doses within the past week. She has since resumed her medication regimen. The patient's most recent CD4 count was 541, consistent with previous results. The patient's viral load was undetectable as of her last check in September.  The patient has been experiencing intermittent fevers, chills, and night sweats, which she attributes to PMS and potential menopause. She also reports hot flashes, which she manages by bathing and wiping herself off. The patient has an upcoming appointment with a gynecologist to discuss these symptoms and potential menopause.  The patient has been vaccinated against the flu. She is currently on Descovy and Prezcobix for her HIV management. The patient also mentions a desire to have a child, despite acknowledging potential fertility issues due to her age and irregular periods. She plans to discuss this further with her gynecologist.  Outpatient Encounter Medications as of 08/16/2023  Medication Sig   darunavir-cobicistat (PREZCOBIX) 800-150 MG tablet TAKE ONE TABLET BY MOUTH DAILY WITH BREAKFAST *SWALLOW WHOLE. DO NOT CRUSH, BREAK OR CHEW TABLETS*   emtricitabine-tenofovir AF (DESCOVY) 200-25 MG tablet Take 1 tablet by mouth daily.   FLUoxetine (PROZAC) 20 MG capsule Take 1 capsule (20 mg total) by mouth daily.   hydrochlorothiazide (HYDRODIURIL) 25 MG tablet Take 0.5 tablets (12.5 mg total) by mouth daily.   rosuvastatin (CRESTOR) 10 MG tablet Take 1 tablet (10 mg total) by mouth daily.   valACYclovir (VALTREX) 1000 MG tablet Take 1 tablet (1,000 mg total) by mouth daily.   albuterol (VENTOLIN HFA) 108 (90 Base) MCG/ACT inhaler Inhale 1 puff into the lungs every 6 (six) hours as needed for wheezing or shortness of breath. (Patient not taking: Reported on  08/16/2023)   cyclobenzaprine (FLEXERIL) 10 MG tablet Take 1 tablet (10 mg total) by mouth 2 (two) times daily as needed for muscle spasms. (Patient not taking: Reported on 08/16/2023)   dicyclomine (BENTYL) 20 MG tablet Take 1 tablet (20 mg total) by mouth 2 (two) times daily. (Patient not taking: Reported on 08/16/2023)   diphenhydrAMINE-zinc acetate (BENADRYL EXTRA STRENGTH) cream Apply 1 Application topically 3 (three) times daily as needed for itching. (Patient not taking: Reported on 08/16/2023)   doxycycline (VIBRA-TABS) 100 MG tablet Take 1 tablet (100 mg total) by mouth 2 (two) times daily. Take on full stomach (Patient not taking: Reported on 08/16/2023)   erythromycin ophthalmic ointment Place a 1/2 inch ribbon of ointment into the lower eyelid. (Patient not taking: Reported on 08/16/2023)   ferrous sulfate 325 (65 FE) MG tablet Take 1 tablet (325 mg total) by mouth daily. (Patient not taking: Reported on 08/16/2023)   meloxicam (MOBIC) 15 MG tablet Take 1 tablet (15 mg) by mouth once a day as needed for pain and swelling. DO NOT TAKE AT THE SAME TIME AS IBUPROFEN OR CELEBREX (Patient not taking: Reported on 08/16/2023)   ondansetron (ZOFRAN-ODT) 4 MG disintegrating tablet Dissolve 1 tablet under the tongue every 8 (eight) hours as needed for nausea or vomiting. (Patient not taking: Reported on 08/16/2023)   tretinoin (RETIN-A) 0.05 % cream Apply topically at bedtime. (Patient not taking: Reported on 08/16/2023)   triamcinolone (KENALOG) 0.025 % ointment Apply 1 Application topically 2 (two) times daily as needed (apply to affected area). (Patient not taking: Reported on 08/16/2023)   [DISCONTINUED] diphenhydrAMINE (BENADRYL) 25 MG tablet Take 1 tablet (  25 mg total) by mouth every 6 (six) hours as needed for up to 3 days.   [DISCONTINUED] famotidine (PEPCID) 20 MG tablet Take 1 tablet (20 mg total) by mouth 2 (two) times daily for 5 days.   No facility-administered encounter medications on  file as of 08/16/2023.     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   Vaginal discharge 08/14/2022   Enlarged thyroid gland 01/30/2022   Fibroids 07/12/2021   Need for prophylactic vaccination and inoculation against influenza 07/12/2021   Viral illness 01/08/2019   HIV disease (HCC) 03/05/2017   HTN (hypertension) 03/05/2017   Anemia 03/13/2016   Anemia due to chronic blood loss 03/13/2016   Acquired immune deficiency syndrome (HCC) 12/07/2015   Acne vulgaris 12/07/2015   Mood disorder (HCC) 12/07/2015     Health Maintenance Due  Topic Date Due   Zoster Vaccines- Shingrix (1 of 2) Never done   Colonoscopy  Never done   COVID-19 Vaccine (3 - Pfizer risk series) 07/20/2022   MAMMOGRAM  04/14/2023     Review of Systems 12 point ros is negative except what is mentioned in hpi Physical Exam   BP 127/83   Pulse 82   Temp (!) 97.3 F (36.3 C) (Temporal)   Ht 5\' 4"  (1.626 m)   Wt 169 lb (76.7 kg)   LMP 07/01/2023 (Approximate)   SpO2 99%   BMI 29.01 kg/m   Physical Exam  Constitutional:  oriented to person, place, and time. appears well-developed and well-nourished. No distress.  HENT: [place]/AT, PERRLA, no scleral icterus Mouth/Throat: Oropharynx is clear and moist. No oropharyngeal exudate.  Cardiovascular: Normal rate, regular rhythm and normal heart sounds. Exam reveals no gallop and no friction rub.  No murmur heard.  Pulmonary/Chest: Effort normal and breath sounds normal. No respiratory distress.  has no wheezes.  Neck = supple, no nuchal rigidity Abdominal: Soft. Bowel sounds are normal.  exhibits no distension. There is no tenderness.  Lymphadenopathy: no cervical adenopathy. No axillary adenopathy Neurological: alert and oriented to person, place, and time.  Skin: Skin is warm and dry. No rash noted. No erythema.  Psychiatric: a normal mood and  affect.  behavior is normal.   Lab Results  Component Value Date   CD4TCELL 44 08/15/2023   Lab Results  Component Value Date   CD4TABS 541 08/15/2023   CD4TABS 705 05/14/2023   CD4TABS 515 12/05/2022   Lab Results  Component Value Date   HIV1RNAQUANT <20 (H) 05/14/2023   Lab Results  Component Value Date   HEPBSAB POS (A) 12/09/2015   Lab Results  Component Value Date   LABRPR NON-REACTIVE 05/14/2023    CBC Lab Results  Component Value Date   WBC 3.8 (L) 08/15/2023   RBC 4.06 08/15/2023   HGB 11.0 (L) 08/15/2023   HCT 34.6 (L) 08/15/2023   PLT 276 08/15/2023   MCV 85.2 08/15/2023   MCH 27.1 08/15/2023   MCHC 31.8 08/15/2023   RDW 15.4 08/15/2023   LYMPHSABS 1.4 08/15/2023   MONOABS 0.4 08/15/2023   EOSABS 0.1 08/15/2023    BMET Lab Results  Component Value Date   NA 136 08/15/2023   K 3.1 (L) 08/15/2023   CL 102 08/15/2023   CO2 25 08/15/2023   GLUCOSE 97 08/15/2023   BUN 8 08/15/2023   CREATININE 1.05 (H) 08/15/2023   CALCIUM 9.2 08/15/2023   GFRNONAA >60  08/15/2023   GFRAA 87 02/02/2021      Assessment and Plan  HIV Management Non-adherent to HIV medication, missing four consecutive doses. CD4 count at 541, viral load undetectable in September. Reports intermittent fevers, chills, and night sweats, attributed to PMS and hot flashes. Emphasized importance of medication adherence to maintain viral suppression and prevent resistance. - Refill Descovy and Prezcobix - Reinforce importance of medication adherence  Menopausal Symptoms Experiencing hot flashes and irregular periods, suggesting onset of menopause. At 52 years old, pregnancy is unlikely. Symptoms manageable and not severe. Discussed need for gynecological evaluation for further assessment of menopausal symptoms and fertility. - Follow up with gynecology for further evaluation of menopausal symptoms and fertility assessment  General Health Maintenance Received flu vaccine.  Long term  medication management = will check cr to see at baseline Follow-up - Schedule follow-up appointment in three months.

## 2023-08-20 ENCOUNTER — Encounter: Payer: 59 | Admitting: Student

## 2023-08-20 NOTE — Progress Notes (Deleted)
    SUBJECTIVE:   Chief compliant/HPI: annual examination  Dawn [name] is a 52 y.o. who presents today for an annual exam.    History tabs reviewed and updated ***. History of HIV/AIDS followed by infectious disease, schizophrenia, and hypertension.   Review of systems form reviewed and notable for ***.   OBJECTIVE:   LMP 07/01/2023 (Approximate)   ***  ASSESSMENT/PLAN:   No problem-specific Assessment & Plan notes found for this encounter.    Annual Examination  See AVS for age appropriate recommendations  PHQ score ***, reviewed and discussed.  BP reviewed and at goal ***.  Asked about intimate partner violence and resources given as appropriate  Advance directives discussion ***  Considered the following items based upon USPSTF recommendations: Diabetes screening: {discussed/ordered:14545} Screening for elevated cholesterol: {discussed/ordered:14545} HIV testing: {discussed/ordered:14545} Hepatitis C: {discussed/ordered:14545} Hepatitis B: {discussed/ordered:14545} Syphilis if at high risk: {discussed/ordered:14545} GC/CT {GC/CT screening :23818} Osteoporosis screening considered based upon risk of fracture from [facility] calculator. Major osteoporotic fracture risk is ***%. DEXA {ordered not order:23822}.  Reviewed risk factors for latent tuberculosis and {not indicated/requested/declined:14582}   Discussed family history, BRCA testing {not indicated/requested/declined:14582}. Tool used to risk stratify was ***.  Cervical cancer screening: {PAPTYPE:23819} Breast cancer screening: {mammoscreen:23820} Colorectal cancer screening: {crcscreen:23821::"discussed, colonoscopy ordered"} Lung cancer screening: {discussed/declined/written info:19698}. See documentation below regarding indications/risks/benefits.  Vaccinations ***.   Follow up in 1 *** year or sooner if indicated.    Eliezer Mccoy, MD [facility]

## 2023-08-21 ENCOUNTER — Other Ambulatory Visit: Payer: Self-pay

## 2023-08-21 NOTE — Progress Notes (Signed)
Specialty Pharmacy Refill Coordination Note  Zariana Charo is a 52 y.o. female contacted today regarding refills of specialty medication(s) Darunavir-Cobicistat (Prezcobix); Emtricitabine-Tenofovir AF (Descovy)   Patient requested Delivery   Delivery date: 09/04/23   Verified address: [address]   [place] [address]   Medication will be filled on 09/03/23.

## 2023-08-30 ENCOUNTER — Other Ambulatory Visit: Payer: 59

## 2023-09-03 ENCOUNTER — Emergency Department (HOSPITAL_BASED_OUTPATIENT_CLINIC_OR_DEPARTMENT_OTHER): Payer: MEDICAID | Admitting: Radiology

## 2023-09-03 ENCOUNTER — Other Ambulatory Visit: Payer: Self-pay

## 2023-09-03 ENCOUNTER — Encounter (HOSPITAL_BASED_OUTPATIENT_CLINIC_OR_DEPARTMENT_OTHER): Payer: Self-pay | Admitting: Emergency Medicine

## 2023-09-03 ENCOUNTER — Emergency Department (HOSPITAL_BASED_OUTPATIENT_CLINIC_OR_DEPARTMENT_OTHER)
Admission: EM | Admit: 2023-09-03 | Discharge: 2023-09-03 | Discharge status: Against Medical Advice | Payer: MEDICAID | Attending: Emergency Medicine | Admitting: Emergency Medicine

## 2023-09-03 DIAGNOSIS — R0989 Other specified symptoms and signs involving the circulatory and respiratory systems: Secondary | ICD-10-CM | POA: Diagnosis not present

## 2023-09-03 DIAGNOSIS — Z5321 Procedure and treatment not carried out due to patient leaving prior to being seen by health care provider: Secondary | ICD-10-CM | POA: Diagnosis not present

## 2023-09-03 DIAGNOSIS — R109 Unspecified abdominal pain: Secondary | ICD-10-CM | POA: Diagnosis not present

## 2023-09-03 DIAGNOSIS — R0981 Nasal congestion: Secondary | ICD-10-CM | POA: Insufficient documentation

## 2023-09-03 DIAGNOSIS — R059 Cough, unspecified: Secondary | ICD-10-CM | POA: Insufficient documentation

## 2023-09-03 DIAGNOSIS — Z20822 Contact with and (suspected) exposure to covid-19: Secondary | ICD-10-CM | POA: Diagnosis not present

## 2023-09-03 LAB — COMPREHENSIVE METABOLIC PANEL
ALT: 15 U/L (ref 0–44)
AST: 20 U/L (ref 15–41)
Albumin: 4.3 g/dL (ref 3.5–5.0)
Alkaline Phosphatase: 46 U/L (ref 38–126)
Anion gap: 8 (ref 5–15)
BUN: 11 mg/dL (ref 6–20)
CO2: 26 mmol/L (ref 22–32)
Calcium: 8.9 mg/dL (ref 8.9–10.3)
Chloride: 104 mmol/L (ref 98–111)
Creatinine, Ser: 0.8 mg/dL (ref 0.44–1.00)
GFR, Estimated: >60 mL/min (ref >60)
Glucose, Bld: 88 mg/dL (ref 70–99)
Potassium: 3.2 mmol/L — ABNORMAL LOW (ref 3.5–5.1)
Sodium: 138 mmol/L (ref 135–145)
Total Bilirubin: 0.7 mg/dL (ref 0.0–1.2)
Total Protein: 7.4 g/dL (ref 6.5–8.1)

## 2023-09-03 LAB — CBC
HCT: 33.7 % — ABNORMAL LOW (ref 36.0–46.0)
Hemoglobin: 10.9 g/dL — ABNORMAL LOW (ref 12.0–15.0)
MCH: 27.3 pg (ref 26.0–34.0)
MCHC: 32.3 g/dL (ref 30.0–36.0)
MCV: 84.3 fL (ref 80.0–100.0)
Platelets: 253 10*3/uL (ref 150–400)
RBC: 4 MIL/uL (ref 3.87–5.11)
RDW: 15.1 % (ref 11.5–15.5)
WBC: 2.5 10*3/uL — ABNORMAL LOW (ref 4.0–10.5)
nRBC: 0 % (ref 0.0–0.2)

## 2023-09-03 LAB — RESP PANEL BY RT-PCR (RSV, FLU A&B, COVID)  RVPGX2
Influenza A by PCR: NEGATIVE
Influenza B by PCR: NEGATIVE
Resp Syncytial Virus by PCR: NEGATIVE
SARS Coronavirus 2 by RT PCR: NEGATIVE

## 2023-09-03 LAB — URINALYSIS, ROUTINE W REFLEX MICROSCOPIC
Bacteria, UA: NONE SEEN
Bilirubin Urine: NEGATIVE
Glucose, UA: NEGATIVE mg/dL
Leukocytes,Ua: NEGATIVE
Nitrite: NEGATIVE
RBC / HPF: >50 RBC/hpf (ref 0–5)
Specific Gravity, Urine: 1.027 (ref 1.005–1.030)
pH: 6.5 (ref 5.0–8.0)

## 2023-09-03 LAB — LIPASE, BLOOD: Lipase: 26 U/L (ref 11–51)

## 2023-09-03 LAB — PREGNANCY, URINE: Preg Test, Ur: NEGATIVE

## 2023-09-03 MED ORDER — DICYCLOMINE HCL 10 MG PO CAPS: 10.0000 mg | ORAL_CAPSULE | Freq: Once | ORAL | Status: DC

## 2023-09-03 MED ORDER — ALUM & MAG HYDROXIDE-SIMETH 200-200-20 MG/5ML PO SUSP
30.0000 mL | Freq: Once | ORAL | Status: DC
Start: 2023-09-03 — End: 2023-09-03

## 2023-09-03 NOTE — ED Provider Triage Note (Signed)
 Emergency Medicine Provider Triage Evaluation Note  Dawn [name] , a 53 y.o. female  was evaluated in triage.  Pt complains of productive cough, congestion, abd pain over the past few days. No vomiting or diarrhea.   Review of Systems  Positive:  Negative:   Physical Exam  BP (!) 152/95 (BP Location: Right Arm)   Pulse 92   Temp 98.6 F (37 C)   Resp 18   LMP 09/01/2023 (Approximate)   SpO2 100%  Gen:   Awake, no distress   Resp:  Normal effort  MSK:   Moves extremities without difficulty  Other:  Mild generalized tenderness  Medical Decision Making  Medically screening exam initiated at 4:50 PM.  Appropriate orders placed.  Dawn [name] was informed that the remainder of the evaluation will be completed by another provider, this initial triage assessment does not replace that evaluation, and the importance of remaining in the ED until their evaluation is complete.     Donnajean Lynwood DEL, PA-C 09/03/23 1656

## 2023-09-03 NOTE — ED Triage Notes (Signed)
  Congestion, cough, runny nose started Thursday Not relieved OTC meds

## 2023-09-11 DIAGNOSIS — F99 Mental disorder, not otherwise specified: Secondary | ICD-10-CM | POA: Diagnosis not present

## 2023-09-25 ENCOUNTER — Other Ambulatory Visit: Payer: Self-pay

## 2023-09-25 NOTE — Progress Notes (Signed)
Specialty Pharmacy Refill Coordination Note  Dawn [name] is a 53 y.o. female contacted today regarding refills of specialty medication(s) Darunavir-Cobicistat (Prezcobix); Emtricitabine-Tenofovir AF (Descovy)   Patient requested Delivery   Delivery date: 10/01/23   Verified address: [address]   [place] [id number]   Medication will be filled on 01.31.25.

## 2023-09-28 ENCOUNTER — Other Ambulatory Visit: Payer: Self-pay

## 2023-10-16 DIAGNOSIS — F99 Mental disorder, not otherwise specified: Secondary | ICD-10-CM | POA: Diagnosis not present

## 2023-10-18 ENCOUNTER — Other Ambulatory Visit (HOSPITAL_COMMUNITY): Payer: Self-pay

## 2023-10-19 ENCOUNTER — Other Ambulatory Visit: Payer: Self-pay

## 2023-10-19 ENCOUNTER — Emergency Department (HOSPITAL_BASED_OUTPATIENT_CLINIC_OR_DEPARTMENT_OTHER)
Admission: EM | Admit: 2023-10-19 | Discharge: 2023-10-19 | Disposition: A | Discharge status: Home / Self Care | Payer: MEDICAID | Attending: Emergency Medicine | Admitting: Emergency Medicine

## 2023-10-19 ENCOUNTER — Encounter (HOSPITAL_BASED_OUTPATIENT_CLINIC_OR_DEPARTMENT_OTHER): Payer: Self-pay | Admitting: Emergency Medicine

## 2023-10-19 DIAGNOSIS — R112 Nausea with vomiting, unspecified: Secondary | ICD-10-CM | POA: Diagnosis present

## 2023-10-19 DIAGNOSIS — K529 Noninfective gastroenteritis and colitis, unspecified: Secondary | ICD-10-CM | POA: Diagnosis not present

## 2023-10-19 DIAGNOSIS — Z21 Asymptomatic human immunodeficiency virus [HIV] infection status: Secondary | ICD-10-CM | POA: Diagnosis not present

## 2023-10-19 LAB — LIPASE, BLOOD: Lipase: 25 U/L (ref 11–51)

## 2023-10-19 LAB — CBC
HCT: 35.4 % — ABNORMAL LOW (ref 36.0–46.0)
Hemoglobin: 11.4 g/dL — ABNORMAL LOW (ref 12.0–15.0)
MCH: 26.5 pg (ref 26.0–34.0)
MCHC: 32.2 g/dL (ref 30.0–36.0)
MCV: 82.1 fL (ref 80.0–100.0)
Platelets: 302 10*3/uL (ref 150–400)
RBC: 4.31 MIL/uL (ref 3.87–5.11)
RDW: 14.3 % (ref 11.5–15.5)
WBC: 4 10*3/uL (ref 4.0–10.5)
nRBC: 0 % (ref 0.0–0.2)

## 2023-10-19 LAB — URINALYSIS, ROUTINE W REFLEX MICROSCOPIC
Bacteria, UA: NONE SEEN
Bilirubin Urine: NEGATIVE
Glucose, UA: NEGATIVE mg/dL
Ketones, ur: NEGATIVE mg/dL
Leukocytes,Ua: NEGATIVE
Nitrite: NEGATIVE
Protein, ur: 30 mg/dL — AB
Specific Gravity, Urine: 1.022 (ref 1.005–1.030)
pH: 8.5 — ABNORMAL HIGH (ref 5.0–8.0)

## 2023-10-19 LAB — COMPREHENSIVE METABOLIC PANEL
ALT: 15 U/L (ref 0–44)
AST: 18 U/L (ref 15–41)
Albumin: 4.3 g/dL (ref 3.5–5.0)
Alkaline Phosphatase: 49 U/L (ref 38–126)
Anion gap: 7 (ref 5–15)
BUN: 7 mg/dL (ref 6–20)
CO2: 28 mmol/L (ref 22–32)
Calcium: 9.3 mg/dL (ref 8.9–10.3)
Chloride: 103 mmol/L (ref 98–111)
Creatinine, Ser: 0.87 mg/dL (ref 0.44–1.00)
GFR, Estimated: >60 mL/min (ref >60)
Glucose, Bld: 96 mg/dL (ref 70–99)
Potassium: 3.9 mmol/L (ref 3.5–5.1)
Sodium: 138 mmol/L (ref 135–145)
Total Bilirubin: 0.5 mg/dL (ref 0.0–1.2)
Total Protein: 7.9 g/dL (ref 6.5–8.1)

## 2023-10-19 LAB — RESP PANEL BY RT-PCR (RSV, FLU A&B, COVID)  RVPGX2
Influenza A by PCR: NEGATIVE
Influenza B by PCR: NEGATIVE
Resp Syncytial Virus by PCR: NEGATIVE
SARS Coronavirus 2 by RT PCR: NEGATIVE

## 2023-10-19 LAB — PREGNANCY, URINE: Preg Test, Ur: NEGATIVE

## 2023-10-19 MED ORDER — ONDANSETRON 4 MG PO TBDP
4.0000 mg | ORAL_TABLET | Freq: Three times a day (TID) | ORAL | 0 refills | Status: DC | PRN
  Filled 2023-10-19: qty 18, 6d supply, fill #0

## 2023-10-19 MED ORDER — LOPERAMIDE HCL 2 MG PO CAPS
2.0000 mg | ORAL_CAPSULE | Freq: Four times a day (QID) | ORAL | 0 refills | Status: AC | PRN
  Filled 2023-10-19: qty 12, 3d supply, fill #0

## 2023-10-19 MED ORDER — ONDANSETRON HCL 4 MG/2ML IJ SOLN
4.0000 mg | Freq: Once | INTRAMUSCULAR | Status: AC | PRN
  Administered 2023-10-19: 4 mg via INTRAVENOUS
  Filled 2023-10-19: qty 2

## 2023-10-19 MED ORDER — LOPERAMIDE HCL 2 MG PO CAPS
4.0000 mg | ORAL_CAPSULE | Freq: Once | ORAL | Status: AC
  Administered 2023-10-19: 4 mg via ORAL
  Filled 2023-10-19: qty 2

## 2023-10-19 NOTE — ED Notes (Signed)
 ED Provider at bedside.

## 2023-10-19 NOTE — ED Triage Notes (Signed)
Pt arrived via [facility] from home c/o flu-like s/s x2 days.

## 2023-10-19 NOTE — ED Provider Notes (Signed)
[place] EMERGENCY DEPARTMENT AT [facility] Provider Note   CSN: [id number] Arrival date & time: 10/19/23  1736     History  Chief Complaint  Patient presents with   Emesis    Dawn [name] is a 53 y.o. female.  HPI     This is a 53 year old female who presents with nausea, vomiting, diarrhea.  Patient reports 2 to 3-day history of symptoms.  No significant abdominal pain.  Denies chest pain, shortness of breath.  No noted fevers.  Generally states that she has not felt well.  Reports that she may have had sick contacts but is unsure.  Home Medications Prior to Admission medications   Medication Sig Start Date End Date Taking? Authorizing Provider  loperamide (IMODIUM) 2 MG capsule Take 1 capsule (2 mg total) by mouth 4 (four) times daily as needed for diarrhea or loose stools. 10/19/23  Yes Darryll Raju, Mayer Masker, MD  ondansetron (ZOFRAN-ODT) 4 MG disintegrating tablet Take 1 tablet (4 mg total) by mouth every 8 (eight) hours as needed. 10/19/23  Yes Johnell Bas, Mayer Masker, MD  albuterol (VENTOLIN HFA) 108 (90 Base) MCG/ACT inhaler Inhale 1 puff into the lungs every 6 (six) hours as needed for wheezing or shortness of breath. Patient not taking: Reported on 08/16/2023 06/14/21   Merrilee Jansky, MD  cyclobenzaprine (FLEXERIL) 10 MG tablet Take 1 tablet (10 mg total) by mouth 2 (two) times daily as needed for muscle spasms. Patient not taking: Reported on 08/16/2023 06/07/23   Sherian Maroon A, PA  darunavir-cobicistat (PREZCOBIX) 800-150 MG tablet TAKE ONE TABLET BY MOUTH DAILY WITH BREAKFAST *SWALLOW WHOLE. DO NOT CRUSH, BREAK OR CHEW TABLETS* 08/16/23   Judyann Munson, MD  dicyclomine (BENTYL) 20 MG tablet Take 1 tablet (20 mg total) by mouth 2 (two) times daily. Patient not taking: Reported on 08/16/2023 08/17/22   Raspet, Noberto Retort, PA-C  diphenhydrAMINE-zinc acetate (BENADRYL EXTRA STRENGTH) cream Apply 1 Application topically 3 (three) times daily as needed for  itching. Patient not taking: Reported on 08/16/2023 01/25/23   Rosezella Rumpf, PA-C  doxycycline (VIBRA-TABS) 100 MG tablet Take 1 tablet (100 mg total) by mouth 2 (two) times daily. Take on full stomach Patient not taking: Reported on 08/16/2023 05/14/23   Judyann Munson, MD  emtricitabine-tenofovir AF (DESCOVY) 200-25 MG tablet Take 1 tablet by mouth daily. 08/16/23   Judyann Munson, MD  erythromycin ophthalmic ointment Place a 1/2 inch ribbon of ointment into the lower eyelid. Patient not taking: Reported on 08/16/2023 11/16/22   Garrison, [place] N, FNP  ferrous sulfate 325 (65 FE) MG tablet Take 1 tablet (325 mg total) by mouth daily. Patient not taking: Reported on 08/16/2023 09/04/20   Petrucelli, Pleas Koch, PA-C  FLUoxetine (PROZAC) 20 MG capsule Take 1 capsule (20 mg total) by mouth daily. 03/15/23   Judyann Munson, MD  hydrochlorothiazide (HYDRODIURIL) 25 MG tablet Take 0.5 tablets (12.5 mg total) by mouth daily. 05/14/23   Judyann Munson, MD  meloxicam (MOBIC) 15 MG tablet Take 1 tablet (15 mg) by mouth once a day as needed for pain and swelling. DO NOT TAKE AT THE SAME TIME AS IBUPROFEN OR CELEBREX Patient not taking: Reported on 08/16/2023 07/09/23     ondansetron (ZOFRAN-ODT) 4 MG disintegrating tablet Dissolve 1 tablet under the tongue every 8 (eight) hours as needed for nausea or vomiting. Patient not taking: Reported on 08/16/2023 12/14/22   Tegeler, Canary Brim, MD  rosuvastatin (CRESTOR) 10 MG tablet Take 1 tablet (10 mg total) by  mouth daily. 02/12/23   Judyann Munson, MD  tretinoin (RETIN-A) 0.05 % cream Apply topically at bedtime. Patient not taking: Reported on 08/16/2023 11/30/22   Lockie Mola, MD  triamcinolone (KENALOG) 0.025 % ointment Apply 1 Application topically 2 (two) times daily as needed (apply to affected area). Patient not taking: Reported on 08/16/2023 01/23/23   Bing Neighbors, NP  valACYclovir (VALTREX) 1000 MG tablet Take 1 tablet (1,000 mg total) by  mouth daily. 02/12/23   Judyann Munson, MD  diphenhydrAMINE (BENADRYL) 25 MG tablet Take 1 tablet (25 mg total) by mouth every 6 (six) hours as needed for up to 3 days. 06/17/20 09/04/20  Orvil Feil, PA-C  famotidine (PEPCID) 20 MG tablet Take 1 tablet (20 mg total) by mouth 2 (two) times daily for 5 days. 06/17/20 09/04/20  Orvil Feil, PA-C      Allergies    Chocolate and Tomato    Review of Systems   Review of Systems  Constitutional:  Negative for fever.  Respiratory:  Negative for shortness of breath.   Cardiovascular:  Negative for chest pain.  Gastrointestinal:  Positive for diarrhea, nausea and vomiting. Negative for abdominal pain.  All other systems reviewed and are negative.   Physical Exam Updated Vital Signs BP (!) 155/93 (BP Location: Right Arm)   Pulse 82   Temp 98 F (36.7 C) (Oral)   Resp 20   Ht 1.626 m (5\' 4" )   Wt 72.6 kg   LMP 10/18/2023 (Exact Date)   SpO2 100%   BMI 27.46 kg/m  Physical Exam Vitals and nursing note reviewed.  Constitutional:      Appearance: She is well-developed. She is not ill-appearing.  HENT:     Head: Normocephalic and atraumatic.  Eyes:     Pupils: Pupils are equal, round, and reactive to light.  Cardiovascular:     Rate and Rhythm: Normal rate and regular rhythm.     Heart sounds: Normal heart sounds.  Pulmonary:     Effort: Pulmonary effort is normal. No respiratory distress.     Breath sounds: No wheezing.  Abdominal:     Palpations: Abdomen is soft.     Tenderness: There is no abdominal tenderness. There is no guarding or rebound.  Musculoskeletal:     Cervical back: Neck supple.  Skin:    General: Skin is warm and dry.  Neurological:     Mental Status: She is alert and oriented to person, place, and time.  Psychiatric:        Mood and Affect: Mood normal.     ED Results / Procedures / Treatments   Labs (all labs ordered are listed, but only abnormal results are displayed) Labs Reviewed  CBC -  Abnormal; Notable for the following components:      Result Value   Hemoglobin 11.4 (*)    HCT 35.4 (*)    All other components within normal limits  URINALYSIS, ROUTINE W REFLEX MICROSCOPIC - Abnormal; Notable for the following components:   pH 8.5 (*)    Hgb urine dipstick MODERATE (*)    Protein, ur 30 (*)    All other components within normal limits  RESP PANEL BY RT-PCR (RSV, FLU A&B, COVID)  RVPGX2  LIPASE, BLOOD  COMPREHENSIVE METABOLIC PANEL  PREGNANCY, URINE    EKG None  Radiology No results found.  Procedures Procedures    Medications Ordered in ED Medications  ondansetron (ZOFRAN) injection 4 mg (has no administration in time range)  loperamide (IMODIUM) capsule 4 mg (has no administration in time range)    ED Course/ Medical Decision Making/ A&P                                 Medical Decision Making Amount and/or Complexity of Data Reviewed Labs: ordered.  Risk Prescription drug management.   This patient presents to the ED for concern of nausea, vomiting, diarrhea, this involves an extensive number of treatment options, and is a complaint that carries with it a high risk of complications and morbidity.  I considered the following differential and admission for this acute, potentially life threatening condition.  The differential diagnosis includes viral illness such as COVID, influenza, norovirus, less likely SBO or intra-abdominal pathology  MDM:    This is a 53 year old female who presents with nausea, vomiting, diarrhea.  Nontoxic.  Vital signs notable for blood pressure 155/93.  Physical exam is benign.  Labs obtained and reviewed.  Normal white count.  Normal metabolic panel and liver function test.  Lipase normal.  Urinalysis reassuring.  Patient was given Zofran and Imodium.  Clinically improved.  Will treat symptomatically.  COVID and influenza testing are negative.  Highly suspect other viral etiology.  (Labs, imaging, consults)  Labs: I  Ordered, and personally interpreted labs.  The pertinent results include: CBC, CMP, lipase, urinalysis, COVID, influenza  Imaging Studies ordered: I ordered imaging studies including none I independently visualized and interpreted imaging. I agree with the radiologist interpretation  Additional history obtained from chart review.  External records from outside source obtained and reviewed including prior evaluations  Cardiac Monitoring: The patient was maintained on a cardiac monitor.  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    Dispense:  20 tablet    Refill:  0   loperamide (IMODIUM) 2 MG capsule    Sig: Take 1 capsule (2 mg total) by mouth 4 (four) times daily as needed for diarrhea or loose stools.    Dispense:  12 capsule    Refill:  0    I have reviewed the patients home medicines and have made adjustments as needed  Problem List / ED Course: Problem List Items Addressed This Visit   None Visit Diagnoses       Gastroenteritis    -  Primary                   Final Clinical Impression(s) / ED Diagnoses Final diagnoses:  Gastroenteritis     Rx / DC Orders ED Discharge Orders  Ordered    ondansetron (ZOFRAN-ODT) 4 MG disintegrating tablet  Every 8 hours PRN        10/19/23 2321    loperamide (IMODIUM) 2 MG capsule  4 times daily PRN        10/19/23 2321              Shon Baton, MD 10/19/23 2324

## 2023-10-19 NOTE — Discharge Instructions (Addendum)
You were seen today for vomiting and diarrhea.  This is likely viral in nature.  Your COVID and flu testing is negative.  Take Zofran for nausea and Imodium for diarrhea.  Make sure that you are staying hydrated.

## 2023-10-19 NOTE — ED Triage Notes (Signed)
Pt via ems from home with n/v/d and feeling "terrible." Pt has not been taking any medications; A&O x 4; nad noted.

## 2023-10-19 NOTE — ED Notes (Signed)
 RN reviewed discharge instructions with pt. Pt verbalized understanding and had no further questions. VSS upon discharge.

## 2023-10-20 ENCOUNTER — Other Ambulatory Visit (HOSPITAL_COMMUNITY): Payer: Self-pay

## 2023-10-22 ENCOUNTER — Other Ambulatory Visit: Payer: Self-pay

## 2023-10-23 ENCOUNTER — Other Ambulatory Visit (HOSPITAL_COMMUNITY): Payer: Self-pay

## 2023-10-25 ENCOUNTER — Other Ambulatory Visit: Payer: Self-pay

## 2023-10-29 ENCOUNTER — Other Ambulatory Visit (HOSPITAL_COMMUNITY): Payer: Self-pay

## 2023-10-30 ENCOUNTER — Other Ambulatory Visit (HOSPITAL_COMMUNITY): Payer: Self-pay

## 2023-10-30 ENCOUNTER — Other Ambulatory Visit: Payer: Self-pay

## 2023-10-31 ENCOUNTER — Other Ambulatory Visit: Payer: Self-pay

## 2023-10-31 ENCOUNTER — Other Ambulatory Visit: Payer: Self-pay | Admitting: Pharmacy Technician

## 2023-10-31 NOTE — Progress Notes (Signed)
 Specialty Pharmacy Refill Coordination Note  Dawn [name] is a 53 y.o. female contacted today regarding refills of specialty medication(s) Darunavir-Cobicistat (Prezcobix); Emtricitabine-Tenofovir AF (Descovy)   Patient requested Delivery   Delivery date: 11/07/23   Verified address: [address]  [place]   Medication will be filled on 11/06/23.

## 2023-11-06 ENCOUNTER — Other Ambulatory Visit: Payer: Self-pay

## 2023-11-06 ENCOUNTER — Other Ambulatory Visit (HOSPITAL_COMMUNITY): Payer: Self-pay

## 2023-11-06 ENCOUNTER — Telehealth: Payer: Self-pay

## 2023-11-06 NOTE — Telephone Encounter (Signed)
 Received notification from  [facility]  regarding a prior authorization for DESCOVY. Authorization has been APPROVED from 11/06/23 to 11/05/24.   Per test claim, copay for 30 days supply is $0.00  Patient can fill through [facility]: [phone]   Authorization # [id number] Phone # [phone]

## 2023-11-07 DIAGNOSIS — F99 Mental disorder, not otherwise specified: Secondary | ICD-10-CM | POA: Diagnosis not present

## 2023-11-15 ENCOUNTER — Ambulatory Visit: Payer: MEDICAID | Admitting: Internal Medicine

## 2023-11-27 ENCOUNTER — Other Ambulatory Visit: Payer: Self-pay

## 2023-11-28 ENCOUNTER — Ambulatory Visit: Payer: MEDICAID | Admitting: Internal Medicine

## 2023-11-28 ENCOUNTER — Other Ambulatory Visit: Payer: Self-pay

## 2023-11-28 ENCOUNTER — Encounter: Payer: Self-pay | Admitting: Internal Medicine

## 2023-11-28 VITALS — BP 129/87 | HR 97 | Temp 97.7°F | Ht 64.0 in | Wt 158.0 lb

## 2023-11-28 DIAGNOSIS — G8929 Other chronic pain: Secondary | ICD-10-CM

## 2023-11-28 DIAGNOSIS — Z79899 Other long term (current) drug therapy: Secondary | ICD-10-CM

## 2023-11-28 DIAGNOSIS — B20 Human immunodeficiency virus [HIV] disease: Secondary | ICD-10-CM

## 2023-11-28 DIAGNOSIS — Z113 Encounter for screening for infections with a predominantly sexual mode of transmission: Secondary | ICD-10-CM

## 2023-11-28 NOTE — Progress Notes (Signed)
 RFV: follow up for hiv disease  Patient ID: Dawn [name], female   DOB: [birth year], 53 y.o.   MRN: [id number]  HPI Dawn [name] is a 53yo F with hiv disease taking descovy , prezcobix . She reports that she  Missed 3 days in 10/16/23 due to death in the family. Overall she is doing well. No health concerns  Outpatient Encounter Medications as of 11/28/2023  Medication Sig   darunavir -cobicistat  (PREZCOBIX ) 800-150 MG tablet TAKE ONE TABLET BY MOUTH DAILY WITH BREAKFAST *SWALLOW WHOLE. DO NOT CRUSH, BREAK OR CHEW TABLETS*   emtricitabine -tenofovir  AF (DESCOVY ) 200-25 MG tablet Take 1 tablet by mouth daily.   hydrochlorothiazide  (HYDRODIURIL ) 25 MG tablet Take 0.5 tablets (12.5 mg total) by mouth daily.   rosuvastatin  (CRESTOR ) 10 MG tablet Take 1 tablet (10 mg total) by mouth daily.   valACYclovir  (VALTREX ) 1000 MG tablet Take 1 tablet (1,000 mg total) by mouth daily.   albuterol  (VENTOLIN  HFA) 108 (90 Base) MCG/ACT inhaler Inhale 1 puff into the lungs every 6 (six) hours as needed for wheezing or shortness of breath. (Patient not taking: Reported on 08/16/2023)   cyclobenzaprine  (FLEXERIL ) 10 MG tablet Take 1 tablet (10 mg total) by mouth 2 (two) times daily as needed for muscle spasms. (Patient not taking: Reported on 08/16/2023)   dicyclomine  (BENTYL ) 20 MG tablet Take 1 tablet (20 mg total) by mouth 2 (two) times daily. (Patient not taking: Reported on 08/16/2023)   diphenhydrAMINE -zinc  acetate (BENADRYL  EXTRA STRENGTH) cream Apply 1 Application topically 3 (three) times daily as needed for itching. (Patient not taking: Reported on 08/16/2023)   doxycycline  (VIBRA -TABS) 100 MG tablet Take 1 tablet (100 mg total) by mouth 2 (two) times daily. Take on full stomach (Patient not taking: Reported on 08/16/2023)   erythromycin  ophthalmic ointment Place a 1/2 inch ribbon of ointment into the lower eyelid. (Patient not taking: Reported on 08/16/2023)   ferrous sulfate  325 (65 FE) MG tablet Take 1 tablet  (325 mg total) by mouth daily. (Patient not taking: Reported on 08/16/2023)   FLUoxetine  (PROZAC ) 20 MG capsule Take 1 capsule (20 mg total) by mouth daily.   loperamide  (IMODIUM ) 2 MG capsule Take 1 capsule (2 mg total) by mouth 4 (four) times daily as needed for diarrhea or loose stools.   meloxicam  (MOBIC ) 15 MG tablet Take 1 tablet (15 mg) by mouth once a day as needed for pain and swelling. DO NOT TAKE AT THE SAME TIME AS IBUPROFEN  OR CELEBREX (Patient not taking: Reported on 08/16/2023)   ondansetron  (ZOFRAN -ODT) 4 MG disintegrating tablet Dissolve 1 tablet under the tongue every 8 (eight) hours as needed for nausea or vomiting. (Patient not taking: Reported on 08/16/2023)   ondansetron  (ZOFRAN -ODT) 4 MG disintegrating tablet Take 1 tablet (4 mg total) by mouth every 8 (eight) hours as needed.   tretinoin  (RETIN-A ) 0.05 % cream Apply topically at bedtime. (Patient not taking: Reported on 08/16/2023)   triamcinolone  (KENALOG ) 0.025 % ointment Apply 1 Application topically 2 (two) times daily as needed (apply to affected area). (Patient not taking: Reported on 08/16/2023)   [DISCONTINUED] diphenhydrAMINE  (BENADRYL ) 25 MG tablet Take 1 tablet (25 mg total) by mouth every 6 (six) hours as needed for up to 3 days.   [DISCONTINUED] famotidine  (PEPCID ) 20 MG tablet Take 1 tablet (20 mg total) by mouth 2 (two) times daily for 5 days.   No facility-administered encounter medications on file as of 11/28/2023.     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   Vaginal discharge 08/14/2022   Enlarged thyroid  gland 01/30/2022   Fibroids 07/12/2021   Need for prophylactic vaccination and inoculation against influenza 07/12/2021   Viral illness 01/08/2019   HIV disease (HCC) 03/05/2017   HTN (hypertension) 03/05/2017   Anemia 03/13/2016   Anemia due to chronic blood loss 03/13/2016   Acquired  immune deficiency syndrome (HCC) 12/07/2015   Acne vulgaris 12/07/2015   Mood disorder (HCC) 12/07/2015     Health Maintenance Due  Topic Date Due   Zoster Vaccines- Shingrix (1 of 2) Never done   Colonoscopy  Never done   Pneumococcal Vaccine 51-47 Years old (3 of 3 - PPSV23 or PCV20) 11/09/2021   COVID-19 Vaccine (3 - Pfizer risk series) 07/20/2022   MAMMOGRAM  04/14/2023     Review of Systems +tooth discomfort to right lower molar. 12 point ros is negative Physical Exam   BP 129/87   Pulse 97   Temp 97.7 F (36.5 C) (Temporal)   Ht 5\' 4"  (1.626 m)   Wt 158 lb (71.7 kg)   SpO2 99%   BMI 27.12 kg/m   Physical Exam  Constitutional:  oriented to person, place, and time. appears well-developed and well-nourished. No distress.  HENT: [place]/AT, PERRLA, no scleral icterus Mouth/Throat: Oropharynx is clear and moist. No oropharyngeal exudate.  Cardiovascular: Normal rate, regular rhythm and normal heart sounds. Exam reveals no gallop and no friction rub.  No murmur heard.  Pulmonary/Chest: Effort normal and breath sounds normal. No respiratory distress.  has no wheezes.  Neck = supple, no nuchal rigidity Abdominal: Soft. Bowel sounds are normal.  exhibits no distension. There is no tenderness.  Lymphadenopathy: no cervical adenopathy. No axillary adenopathy Neurological: alert and oriented to person, place, and time.  Skin: Skin is warm and dry. No rash noted. No erythema.  Psychiatric: a normal mood and affect.  behavior is normal.   Lab Results  Component Value Date   CD4TCELL 44 08/15/2023   Lab Results  Component Value Date   CD4TABS 541 08/15/2023   CD4TABS 705 05/14/2023   CD4TABS 515 12/05/2022   Lab Results  Component Value Date   HIV1RNAQUANT <20 (H) 05/14/2023   Lab Results  Component Value Date   HEPBSAB POS (A) 12/09/2015   Lab Results  Component Value Date   LABRPR NON-REACTIVE 05/14/2023    CBC Lab Results  Component Value Date   WBC 4.0  10/19/2023   RBC 4.31 10/19/2023   HGB 11.4 (L) 10/19/2023   HCT 35.4 (L) 10/19/2023   PLT 302 10/19/2023   MCV 82.1 10/19/2023   MCH 26.5 10/19/2023   MCHC 32.2 10/19/2023   RDW 14.3 10/19/2023   LYMPHSABS 1.4 08/15/2023   MONOABS 0.4 08/15/2023   EOSABS 0.1 08/15/2023    BMET Lab Results  Component Value Date   NA 138 10/19/2023   K 3.9 10/19/2023   CL 103 10/19/2023   CO2 28 10/19/2023   GLUCOSE 96 10/19/2023   BUN 7 10/19/2023   CREATININE 0.87 10/19/2023   CALCIUM  9.3 10/19/2023   GFRNONAA >60 10/19/2023   GFRAA 87 02/02/2021      Assessment and Plan  HIV disease= continue with current regimen, we will plan to check Hiv labs  Long term monitoring = will check cr  Adherence counseling provided in regards to hiv regimen  Dental pain = don't necessarily thing it is dental infection but still requires  regular cleaning Dentist referral  I have personally spent 40 minutes involved in face-to-face and non-face-to-face activities for this patient on the day of the visit. Professional time spent includes the following activities: Preparing to see the patient (review of tests), Obtaining and/or reviewing separately obtained history (admission/discharge record), Performing a medically appropriate examination and/or evaluation , Ordering medications/tests/procedures, referring and communicating with other health care professionals, Documenting clinical information in the EMR, Independently interpreting results (not separately reported), Communicating results to the patient/family/caregiver, Counseling and educating the patient/family/caregiver and Care coordination (not separately reported).

## 2023-11-29 ENCOUNTER — Other Ambulatory Visit: Payer: Self-pay

## 2023-11-29 LAB — T-HELPER CELL (CD4) - (RCID CLINIC ONLY)
CD4 % Helper T Cell: 46 % (ref 33–65)
CD4 T Cell Abs: 412 /uL (ref 400–1790)

## 2023-11-30 ENCOUNTER — Other Ambulatory Visit: Payer: Self-pay | Admitting: Pharmacy Technician

## 2023-11-30 ENCOUNTER — Other Ambulatory Visit: Payer: Self-pay

## 2023-11-30 ENCOUNTER — Other Ambulatory Visit (HOSPITAL_COMMUNITY): Payer: Self-pay

## 2023-11-30 LAB — COMPLETE METABOLIC PANEL WITHOUT GFR
AG Ratio: 1.4 (calc) (ref 1.0–2.5)
ALT: 9 U/L (ref 6–29)
AST: 15 U/L (ref 10–35)
Albumin: 4.1 g/dL (ref 3.6–5.1)
Alkaline phosphatase (APISO): 46 U/L (ref 37–153)
BUN: 14 mg/dL (ref 7–25)
CO2: 25 mmol/L (ref 20–32)
Calcium: 9.1 mg/dL (ref 8.6–10.4)
Chloride: 107 mmol/L (ref 98–110)
Creat: 1.03 mg/dL (ref 0.50–1.03)
Globulin: 2.9 g/dL (ref 1.9–3.7)
Glucose, Bld: 86 mg/dL (ref 65–99)
Potassium: 3.8 mmol/L (ref 3.5–5.3)
Sodium: 141 mmol/L (ref 135–146)
Total Bilirubin: 0.4 mg/dL (ref 0.2–1.2)
Total Protein: 7 g/dL (ref 6.1–8.1)

## 2023-11-30 LAB — CBC WITH DIFFERENTIAL/PLATELET
Absolute Lymphocytes: 1112 {cells}/uL (ref 850–3900)
Absolute Monocytes: 313 {cells}/uL (ref 200–950)
Basophils Absolute: 22 {cells}/uL (ref 0–200)
Basophils Relative: 0.6 %
Eosinophils Absolute: 72 {cells}/uL (ref 15–500)
Eosinophils Relative: 2 %
HCT: 32.8 % — ABNORMAL LOW (ref 35.0–45.0)
Hemoglobin: 9.9 g/dL — ABNORMAL LOW (ref 11.7–15.5)
MCH: 25.3 pg — ABNORMAL LOW (ref 27.0–33.0)
MCHC: 30.2 g/dL — ABNORMAL LOW (ref 32.0–36.0)
MCV: 83.7 fL (ref 80.0–100.0)
MPV: 10.7 fL (ref 7.5–12.5)
Monocytes Relative: 8.7 %
Neutro Abs: 2081 {cells}/uL (ref 1500–7800)
Neutrophils Relative %: 57.8 %
Platelets: 314 10*3/uL (ref 140–400)
RBC: 3.92 10*6/uL (ref 3.80–5.10)
RDW: 13.4 % (ref 11.0–15.0)
Total Lymphocyte: 30.9 %
WBC: 3.6 10*3/uL — ABNORMAL LOW (ref 3.8–10.8)

## 2023-11-30 LAB — HIV-1 RNA QUANT-NO REFLEX-BLD
HIV 1 RNA Quant: <20 {copies}/mL — AB
HIV-1 RNA Quant, Log: <1.3 {Log_copies}/mL — AB

## 2023-11-30 LAB — RPR: RPR Ser Ql: NONREACTIVE

## 2023-11-30 NOTE — Progress Notes (Signed)
 Specialty Pharmacy Refill Coordination Note  Dawn [name] is a 53 y.o. female contacted today regarding refills of specialty medication(s) Darunavir-Cobicistat (Prezcobix); Emtricitabine-Tenofovir AF (Descovy)   Patient requested Delivery   Delivery date: 12/03/23   Verified address: [address] [place]   Medication will be filled on 11/30/23.  AR Rx patient request 5 rx total

## 2023-12-11 DIAGNOSIS — F99 Mental disorder, not otherwise specified: Secondary | ICD-10-CM | POA: Diagnosis not present

## 2023-12-18 ENCOUNTER — Other Ambulatory Visit: Payer: Self-pay

## 2023-12-19 ENCOUNTER — Other Ambulatory Visit: Payer: Self-pay

## 2023-12-24 ENCOUNTER — Other Ambulatory Visit: Payer: Self-pay

## 2023-12-24 ENCOUNTER — Other Ambulatory Visit (HOSPITAL_COMMUNITY): Payer: Self-pay

## 2023-12-24 NOTE — Progress Notes (Signed)
 Specialty Pharmacy Refill Coordination Note  Dawn [name] is a 53 y.o. female contacted today regarding refills of specialty medication(s) Darunavir -Cobicistat  (Prezcobix ); Emtricitabine -Tenofovir  AF (Descovy )   Patient requested Delivery   Delivery date: 12/26/23   Verified address: [address]    [place] [id number]   Medication will be filled on 12/25/23.

## 2023-12-25 ENCOUNTER — Other Ambulatory Visit: Payer: Self-pay

## 2024-01-15 NOTE — Progress Notes (Signed)
 The 10-year ASCVD risk score (Arnett DK, et al., 2019) is: 4.2%   Values used to calculate the score:     Age: 53 years     Sex: Female     Is Non-Hispanic African American: Yes     Diabetic: No     Tobacco smoker: No     Systolic Blood Pressure: 129 mmHg     Is BP treated: Yes     HDL Cholesterol: 50 mg/dL     Total Cholesterol: 208 mg/dL  Currently prescribed rosuvastatin  10 mg.  Hailea Eaglin, BSN, RN

## 2024-01-22 ENCOUNTER — Other Ambulatory Visit (HOSPITAL_COMMUNITY): Payer: Self-pay

## 2024-01-22 ENCOUNTER — Other Ambulatory Visit: Payer: Self-pay | Admitting: Internal Medicine

## 2024-01-22 ENCOUNTER — Other Ambulatory Visit: Payer: Self-pay

## 2024-01-22 DIAGNOSIS — B009 Herpesviral infection, unspecified: Secondary | ICD-10-CM

## 2024-01-23 ENCOUNTER — Other Ambulatory Visit (HOSPITAL_COMMUNITY): Payer: Self-pay

## 2024-01-23 ENCOUNTER — Other Ambulatory Visit: Payer: Self-pay

## 2024-01-23 MED ORDER — VALACYCLOVIR HCL 1 G PO TABS
1000.0000 mg | ORAL_TABLET | Freq: Every day | ORAL | 1 refills | Status: DC
  Filled 2024-01-23: qty 30, 30d supply, fill #0
  Filled 2024-02-18: qty 30, 30d supply, fill #1

## 2024-01-24 ENCOUNTER — Other Ambulatory Visit (HOSPITAL_COMMUNITY): Payer: Self-pay

## 2024-01-25 ENCOUNTER — Other Ambulatory Visit: Payer: Self-pay

## 2024-01-30 ENCOUNTER — Emergency Department (HOSPITAL_COMMUNITY)
Admission: EM | Admit: 2024-01-30 | Discharge: 2024-01-30 | Disposition: A | Discharge status: Home / Self Care | Payer: MEDICAID | Attending: Emergency Medicine | Admitting: Emergency Medicine

## 2024-01-30 ENCOUNTER — Other Ambulatory Visit: Payer: Self-pay

## 2024-01-30 ENCOUNTER — Ambulatory Visit: Payer: MEDICAID

## 2024-01-30 DIAGNOSIS — N939 Abnormal uterine and vaginal bleeding, unspecified: Secondary | ICD-10-CM | POA: Insufficient documentation

## 2024-01-30 DIAGNOSIS — Z79899 Other long term (current) drug therapy: Secondary | ICD-10-CM | POA: Diagnosis not present

## 2024-01-30 DIAGNOSIS — I10 Essential (primary) hypertension: Secondary | ICD-10-CM | POA: Insufficient documentation

## 2024-01-30 DIAGNOSIS — E876 Hypokalemia: Secondary | ICD-10-CM | POA: Insufficient documentation

## 2024-01-30 DIAGNOSIS — Z21 Asymptomatic human immunodeficiency virus [HIV] infection status: Secondary | ICD-10-CM | POA: Diagnosis not present

## 2024-01-30 DIAGNOSIS — D649 Anemia, unspecified: Secondary | ICD-10-CM | POA: Insufficient documentation

## 2024-01-30 LAB — URINALYSIS, ROUTINE W REFLEX MICROSCOPIC
Bilirubin Urine: NEGATIVE
Glucose, UA: NEGATIVE mg/dL
Ketones, ur: NEGATIVE mg/dL
Leukocytes,Ua: NEGATIVE
Nitrite: NEGATIVE
Protein, ur: NEGATIVE mg/dL
Specific Gravity, Urine: 1.025 (ref 1.005–1.030)
pH: 6 (ref 5.0–8.0)

## 2024-01-30 LAB — CBC WITH DIFFERENTIAL/PLATELET
Abs Immature Granulocytes: 0.01 10*3/uL (ref 0.00–0.07)
Basophils Absolute: 0 10*3/uL (ref 0.0–0.1)
Basophils Relative: 1 %
Eosinophils Absolute: 0.1 10*3/uL (ref 0.0–0.5)
Eosinophils Relative: 1 %
HCT: 25.5 % — ABNORMAL LOW (ref 36.0–46.0)
Hemoglobin: 7.9 g/dL — ABNORMAL LOW (ref 12.0–15.0)
Immature Granulocytes: 0 %
Lymphocytes Relative: 35 %
Lymphs Abs: 1.3 10*3/uL (ref 0.7–4.0)
MCH: 23.9 pg — ABNORMAL LOW (ref 26.0–34.0)
MCHC: 31 g/dL (ref 30.0–36.0)
MCV: 77 fL — ABNORMAL LOW (ref 80.0–100.0)
Monocytes Absolute: 0.4 10*3/uL (ref 0.1–1.0)
Monocytes Relative: 11 %
Neutro Abs: 1.8 10*3/uL (ref 1.7–7.7)
Neutrophils Relative %: 52 %
Platelets: 304 10*3/uL (ref 150–400)
RBC: 3.31 MIL/uL — ABNORMAL LOW (ref 3.87–5.11)
RDW: 16.9 % — ABNORMAL HIGH (ref 11.5–15.5)
WBC: 3.6 10*3/uL — ABNORMAL LOW (ref 4.0–10.5)
nRBC: 0 % (ref 0.0–0.2)

## 2024-01-30 LAB — HEMOGLOBIN AND HEMATOCRIT, BLOOD
HCT: 25.2 % — ABNORMAL LOW (ref 36.0–46.0)
Hemoglobin: 7.9 g/dL — ABNORMAL LOW (ref 12.0–15.0)

## 2024-01-30 LAB — COMPREHENSIVE METABOLIC PANEL WITH GFR
ALT: 19 U/L (ref 0–44)
AST: 26 U/L (ref 15–41)
Albumin: 3.4 g/dL — ABNORMAL LOW (ref 3.5–5.0)
Alkaline Phosphatase: 42 U/L (ref 38–126)
Anion gap: 7 (ref 5–15)
BUN: 11 mg/dL (ref 6–20)
CO2: 23 mmol/L (ref 22–32)
Calcium: 8.3 mg/dL — ABNORMAL LOW (ref 8.9–10.3)
Chloride: 107 mmol/L (ref 98–111)
Creatinine, Ser: 0.88 mg/dL (ref 0.44–1.00)
GFR, Estimated: >60 mL/min (ref >60)
Glucose, Bld: 96 mg/dL (ref 70–99)
Potassium: 3 mmol/L — ABNORMAL LOW (ref 3.5–5.1)
Sodium: 137 mmol/L (ref 135–145)
Total Bilirubin: 0.6 mg/dL (ref 0.0–1.2)
Total Protein: 6.7 g/dL (ref 6.5–8.1)

## 2024-01-30 LAB — WET PREP, GENITAL
Clue Cells Wet Prep HPF POC: NONE SEEN
Sperm: NONE SEEN
Trich, Wet Prep: NONE SEEN
WBC, Wet Prep HPF POC: <10 (ref <10)
Yeast Wet Prep HPF POC: NONE SEEN

## 2024-01-30 LAB — URINALYSIS, MICROSCOPIC (REFLEX)
Bacteria, UA: NONE SEEN
RBC / HPF: >50 RBC/hpf (ref 0–5)

## 2024-01-30 LAB — TYPE AND SCREEN
ABO/RH(D): B POS
Antibody Screen: NEGATIVE

## 2024-01-30 LAB — PREGNANCY, URINE: Preg Test, Ur: NEGATIVE

## 2024-01-30 MED ORDER — MEGESTROL ACETATE 40 MG PO TABS: 40.0000 mg | ORAL_TABLET | Freq: Two times a day (BID) | ORAL | 0 refills | Status: AC

## 2024-01-30 MED ORDER — FERROUS SULFATE 325 (65 FE) MG PO TABS: 325.0000 mg | ORAL_TABLET | Freq: Every day | ORAL | 0 refills | Status: DC

## 2024-01-30 MED ORDER — POTASSIUM CHLORIDE CRYS ER 20 MEQ PO TBCR
40.0000 meq | EXTENDED_RELEASE_TABLET | Freq: Once | ORAL | Status: AC
  Administered 2024-01-30: 40 meq via ORAL
  Filled 2024-01-30: qty 2

## 2024-01-30 NOTE — ED Notes (Signed)
Patient ambulated to bathroom with urine cup.

## 2024-01-30 NOTE — ED Notes (Signed)
 Patient discharged by RN. Patient verbalizes understanding of instructions without additional questions.

## 2024-01-30 NOTE — Discharge Instructions (Addendum)
 As we discussed, your hemoglobin has dropped since it was last checked and is quite low.  We discussed your case with our OB/GYN on-call who has recommended that we start you on Megace  which is a medication that should stop your bleeding.  Please start by taking 40 mg of Megace  twice a day.  If in a day or so your bleeding has not stopped, then you should increase to 80 mg of Megace  twice a day.   Additionally, low your hemoglobin is, you need to restart iron  supplementation.  I have written you a prescription for this.  Please fill and take as prescribed in its entirety.  It is also extremely important that you follow-up with OB/GYN as soon as possible.  Please call to schedule an appointment at your earliest convenience.  If you are not able to get your bleeding under control with the above interventions and are not able to get into see OB/GYN, you start to feel lightheaded, short of breath, or profoundly weak then you may need to return for additional evaluation.  Return if development of any new or worsening symptoms.

## 2024-01-30 NOTE — ED Notes (Signed)
Patient given urine cup to provide sample.

## 2024-01-30 NOTE — ED Provider Notes (Signed)
 [place] EMERGENCY DEPARTMENT AT [facility] Provider Note   CSN: [id number] Arrival date & time: 01/30/24  7829     History  Chief Complaint  Patient presents with   Vaginal Bleeding    Dawn [name] is a 53 y.o. female.  Patient with history of HIV, uterine fibroids, anemia, hypertension, schizophrenia presents today with complaints of vaginal bleeding. She states that same has been ongoing for the past week and she is continuing to pass large blood clots. States that she has a history of heavy menstrual cycles attributed to her known uterine fibroids, does not think she has required blood transfusion before. Has been on iron  supplementation previously, however is not currently taking this.  She denies any abdominal pain. She has been taking her Descovy  for HIV.   The history is provided by the patient. No language interpreter was used.  Vaginal Bleeding      Home Medications Prior to Admission medications   Medication Sig Start Date End Date Taking? Authorizing Provider  albuterol  (VENTOLIN  HFA) 108 (90 Base) MCG/ACT inhaler Inhale 1 puff into the lungs every 6 (six) hours as needed for wheezing or shortness of breath. Patient not taking: Reported on 08/16/2023 06/14/21   Corine Dice, MD  cyclobenzaprine  (FLEXERIL ) 10 MG tablet Take 1 tablet (10 mg total) by mouth 2 (two) times daily as needed for muscle spasms. Patient not taking: Reported on 08/16/2023 06/07/23   Neil Balls A, PA  darunavir -cobicistat  (PREZCOBIX ) 800-150 MG tablet TAKE ONE TABLET BY MOUTH DAILY WITH BREAKFAST *SWALLOW WHOLE. DO NOT CRUSH, BREAK OR CHEW TABLETS* 08/16/23   Liane Redman, MD  dicyclomine  (BENTYL ) 20 MG tablet Take 1 tablet (20 mg total) by mouth 2 (two) times daily. Patient not taking: Reported on 08/16/2023 08/17/22   Raspet, Erin K, PA-C  diphenhydrAMINE -zinc  acetate (BENADRYL  EXTRA STRENGTH) cream Apply 1 Application topically 3 (three) times daily as needed for  itching. Patient not taking: Reported on 08/16/2023 01/25/23   Kehrli, Kelsey F, PA-C  doxycycline  (VIBRA -TABS) 100 MG tablet Take 1 tablet (100 mg total) by mouth 2 (two) times daily. Take on full stomach Patient not taking: Reported on 08/16/2023 05/14/23   Liane Redman, MD  emtricitabine -tenofovir  AF (DESCOVY ) 200-25 MG tablet Take 1 tablet by mouth daily. 08/16/23   Liane Redman, MD  erythromycin  ophthalmic ointment Place a 1/2 inch ribbon of ointment into the lower eyelid. Patient not taking: Reported on 08/16/2023 11/16/22   Harlow Lighter, [place]  N, FNP  ferrous sulfate  325 (65 FE) MG tablet Take 1 tablet (325 mg total) by mouth daily. Patient not taking: Reported on 08/16/2023 09/04/20   Petrucelli, Samantha R, PA-C  FLUoxetine  (PROZAC ) 20 MG capsule Take 1 capsule (20 mg total) by mouth daily. 03/15/23   Liane Redman, MD  hydrochlorothiazide  (HYDRODIURIL ) 25 MG tablet Take 0.5 tablets (12.5 mg total) by mouth daily. 05/14/23   Liane Redman, MD  loperamide  (IMODIUM ) 2 MG capsule Take 1 capsule (2 mg total) by mouth 4 (four) times daily as needed for diarrhea or loose stools. 10/19/23   Horton, Vonzella Guernsey, MD  meloxicam  (MOBIC ) 15 MG tablet Take 1 tablet (15 mg) by mouth once a day as needed for pain and swelling. DO NOT TAKE AT THE SAME TIME AS IBUPROFEN  OR CELEBREX Patient not taking: Reported on 08/16/2023 07/09/23     ondansetron  (ZOFRAN -ODT) 4 MG disintegrating tablet Dissolve 1 tablet under the tongue every 8 (eight) hours as needed for nausea or vomiting. Patient not taking: Reported on  08/16/2023 12/14/22   Tegeler, Marine Sia, MD  ondansetron  (ZOFRAN -ODT) 4 MG disintegrating tablet Take 1 tablet (4 mg total) by mouth every 8 (eight) hours as needed. 10/19/23   Horton, Vonzella Guernsey, MD  rosuvastatin  (CRESTOR ) 10 MG tablet Take 1 tablet (10 mg total) by mouth daily. 02/12/23   Liane Redman, MD  tretinoin  (RETIN-A ) 0.05 % cream Apply topically at bedtime. Patient not taking: Reported  on 08/16/2023 11/30/22   Santa [place], MD  triamcinolone  (KENALOG ) 0.025 % ointment Apply 1 Application topically 2 (two) times daily as needed (apply to affected area). Patient not taking: Reported on 08/16/2023 01/23/23   Buena Carmine, NP  valACYclovir  (VALTREX ) 1000 MG tablet Take 1 tablet (1,000 mg total) by mouth daily. 01/23/24   Liane Redman, MD  diphenhydrAMINE  (BENADRYL ) 25 MG tablet Take 1 tablet (25 mg total) by mouth every 6 (six) hours as needed for up to 3 days. 06/17/20 09/04/20  Woods, Jaclyn M, PA-C  famotidine  (PEPCID ) 20 MG tablet Take 1 tablet (20 mg total) by mouth 2 (two) times daily for 5 days. 06/17/20 09/04/20  Woods, Jaclyn M, PA-C      Allergies    Chocolate and Tomato    Review of Systems   Review of Systems  Genitourinary:  Positive for vaginal bleeding.  All other systems reviewed and are negative.   Physical Exam Updated Vital Signs BP 135/77   Pulse 82   Temp 99.1 F (37.3 C) (Oral)   Resp 20   Ht 5\' 4"  (1.626 m)   Wt 71.7 kg   SpO2 100%   BMI 27.13 kg/m  Physical Exam Vitals and nursing note reviewed. Exam conducted with a chaperone present.  Constitutional:      General: She is not in acute distress.    Appearance: Normal appearance. She is normal weight. She is not ill-appearing, toxic-appearing or diaphoretic.  HENT:     Head: Normocephalic and atraumatic.  Cardiovascular:     Rate and Rhythm: Normal rate and regular rhythm.     Heart sounds: Normal heart sounds.  Pulmonary:     Effort: Pulmonary effort is normal. No respiratory distress.  Abdominal:     General: Abdomen is flat.     Palpations: Abdomen is soft.     Tenderness: There is no abdominal tenderness.  Genitourinary:    Comments: Some bleeding with a few large clots present within the vagina. No signs of hemorrhage. No CMT or visualized cervical abnormalities. No adnexal tenderness Musculoskeletal:        General: Normal range of motion.     Cervical back: Normal range  of motion.  Skin:    General: Skin is warm and dry.  Neurological:     General: No focal deficit present.     Mental Status: She is alert.  Psychiatric:        Mood and Affect: Mood normal.        Behavior: Behavior normal.     ED Results / Procedures / Treatments   Labs (all labs ordered are listed, but only abnormal results are displayed) Labs Reviewed  CBC WITH DIFFERENTIAL/PLATELET - Abnormal; Notable for the following components:      Result Value   WBC 3.6 (*)    RBC 3.31 (*)    Hemoglobin 7.9 (*)    HCT 25.5 (*)    MCV 77.0 (*)    MCH 23.9 (*)    RDW 16.9 (*)    All other components within normal  limits  COMPREHENSIVE METABOLIC PANEL WITH GFR - Abnormal; Notable for the following components:   Potassium 3.0 (*)    Calcium  8.3 (*)    Albumin 3.4 (*)    All other components within normal limits  URINALYSIS, ROUTINE W REFLEX MICROSCOPIC - Abnormal; Notable for the following components:   Hgb urine dipstick LARGE (*)    All other components within normal limits  HEMOGLOBIN AND HEMATOCRIT, BLOOD - Abnormal; Notable for the following components:   Hemoglobin 7.9 (*)    HCT 25.2 (*)    All other components within normal limits  WET PREP, GENITAL  PREGNANCY, URINE  URINALYSIS, MICROSCOPIC (REFLEX)  TYPE AND SCREEN  GC/CHLAMYDIA PROBE AMP ([place]) NOT AT [facility]    EKG None  Radiology No results found.  Procedures Procedures    Medications Ordered in ED Medications - No data to display  ED Course/ Medical Decision Making/ A&P                                 Medical Decision Making Amount and/or Complexity of Data Reviewed Labs: ordered.   This patient is a 53 y.o. female who presents to the ED for concern of vaginal bleeding, this involves an extensive number of treatment options, and is a complaint that carries with it a high risk of complications and morbidity. The emergent differential diagnosis prior to evaluation includes, but is not limited to,   Abnormal uterine bleeding, vaginal/cervical trauma, STD/PID,  threatened miscarriage, incomplete miscarriage, normal bleeding in early trimester pregnancy, ectopic pregnancy   This is not an exhaustive differential.   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  Additional history: Chart reviewed.  Physical Exam: Physical exam performed. The pertinent findings include: well appearing, abdomen soft and nontender.  GU exam shows clots and some bleeding without signs of hemorrhage or cervical abnormalities.  Lab Tests: I ordered, and personally interpreted labs.  The pertinent results include: Hgb 7.9 --> 7.9 (down from 9.9 2 months ago). Upreg negative, K 3.0. UA noninfectious   Medications: I ordered medication including oral potassium  for hypokalemia. Reevaluation of the patient after these medicines showed that the patient stayed the same. I have reviewed the patients home medicines and have made adjustments as needed.  Consultations Obtained: I requested consultation with the OB/GYN  on call Dr. Racheal Buddle,  and discussed lab and imaging findings as well as pertinent plan - they recommend: d/c, with megace  prescription, start with 40 mg BID and if bleeding does not resolve with this then increase to 80 mg BID. Also recommend restarting patient on iron  with close outpatient OB/GYN follow-up   Disposition: After consideration of the diagnostic results and the patients response to treatment, I feel that emergency department workup does not suggest an emergent condition requiring admission or immediate intervention beyond what has been performed at this time. The plan is: Discharge with Megace , iron  supplements, and close OB/GYN follow-up with return precautions per OB/GYN on-call  recommendations.  Discussed same with patient who is understanding and in agreement with this.  Close return precautions provided as well. Evaluation and diagnostic testing in the emergency department does not suggest an emergent condition requiring admission or immediate intervention beyond what has been performed at this time.  Plan for discharge with  close PCP follow-up.  Patient is understanding and amenable with plan, educated on red flag symptoms that would prompt immediate return.  Patient discharged in stable condition.  I discussed this case with my attending physician Dr. Ranelle Buys who cosigned this note including patient's presenting symptoms, physical exam, and planned diagnostics and interventions. Attending physician stated agreement with plan or made changes to plan which were implemented.    Final Clinical Impression(s) / ED Diagnoses Final diagnoses:  Vaginal bleeding  Anemia, unspecified type  Hypokalemia    Rx / DC Orders ED Discharge Orders          Ordered    megestrol  (MEGACE ) 40 MG tablet  2 times daily        01/30/24 1249    ferrous sulfate  325 (65 FE) MG tablet  Daily        01/30/24 1249          An After Visit Summary was printed and given to the patient.     Sherra Dk, PA-C 01/30/24 1257    Rafael Bun A, DO 02/04/24 1504

## 2024-01-30 NOTE — ED Triage Notes (Addendum)
 Patient arrives via [facility] for vaginal bleeding. Patient endorses being in menopause but bleeding with clots x 1 week. Endorses painful urination with increase in urination, HIV positive. Patient arrives alert and oriented x4.   EMS  150/80  HR 100 O2 98 on room air

## 2024-01-31 ENCOUNTER — Ambulatory Visit: Payer: MEDICAID | Admitting: Student

## 2024-01-31 LAB — GC/CHLAMYDIA PROBE AMP (~~LOC~~) NOT AT ARMC
Chlamydia: NEGATIVE
Comment: NEGATIVE
Comment: NORMAL
Neisseria Gonorrhea: NEGATIVE

## 2024-02-01 DIAGNOSIS — F99 Mental disorder, not otherwise specified: Secondary | ICD-10-CM | POA: Diagnosis not present

## 2024-02-07 ENCOUNTER — Telehealth: Payer: Self-pay | Admitting: Family Medicine

## 2024-02-07 NOTE — Telephone Encounter (Addendum)
 Dawn [name] called to inquire about making an appointment, although there is no referral.   I contacted Dawn [name]'s PCP office. I LVM with the referral coordinator.

## 2024-02-08 NOTE — Telephone Encounter (Signed)
 Dawn [name]

## 2024-02-14 ENCOUNTER — Other Ambulatory Visit: Payer: Self-pay | Admitting: Internal Medicine

## 2024-02-14 ENCOUNTER — Other Ambulatory Visit: Payer: Self-pay

## 2024-02-14 ENCOUNTER — Other Ambulatory Visit (HOSPITAL_COMMUNITY): Payer: Self-pay

## 2024-02-14 DIAGNOSIS — B20 Human immunodeficiency virus [HIV] disease: Secondary | ICD-10-CM

## 2024-02-14 MED ORDER — PREZCOBIX 800-150 MG PO TABS
ORAL_TABLET | ORAL | 0 refills | Status: DC
  Filled 2024-02-15: qty 30, 30d supply, fill #0
  Filled 2024-02-15: qty 30, fill #0

## 2024-02-14 MED ORDER — ROSUVASTATIN CALCIUM 10 MG PO TABS
10.0000 mg | ORAL_TABLET | Freq: Every day | ORAL | 0 refills | Status: DC
  Filled 2024-02-14: qty 30, 30d supply, fill #0

## 2024-02-14 MED ORDER — DESCOVY 200-25 MG PO TABS
1.0000 | ORAL_TABLET | Freq: Every day | ORAL | 0 refills | Status: DC
  Filled 2024-02-15 (×2): qty 30, 30d supply, fill #0

## 2024-02-15 ENCOUNTER — Other Ambulatory Visit: Payer: Self-pay

## 2024-02-15 ENCOUNTER — Other Ambulatory Visit (HOSPITAL_COMMUNITY): Payer: Self-pay

## 2024-02-15 ENCOUNTER — Encounter: Payer: Self-pay | Admitting: Family Medicine

## 2024-02-15 ENCOUNTER — Other Ambulatory Visit (HOSPITAL_COMMUNITY)
Admission: RE | Admit: 2024-02-15 | Discharge: 2024-02-15 | Disposition: A | Discharge status: Home / Self Care | Payer: MEDICAID | Source: Ambulatory Visit | Attending: Family Medicine | Admitting: Family Medicine

## 2024-02-15 ENCOUNTER — Ambulatory Visit (INDEPENDENT_AMBULATORY_CARE_PROVIDER_SITE_OTHER): Payer: MEDICAID | Admitting: Family Medicine

## 2024-02-15 VITALS — BP 128/78 | HR 76 | Ht 64.0 in | Wt 157.0 lb

## 2024-02-15 DIAGNOSIS — Z113 Encounter for screening for infections with a predominantly sexual mode of transmission: Secondary | ICD-10-CM | POA: Diagnosis not present

## 2024-02-15 DIAGNOSIS — N939 Abnormal uterine and vaginal bleeding, unspecified: Secondary | ICD-10-CM | POA: Diagnosis not present

## 2024-02-15 DIAGNOSIS — D649 Anemia, unspecified: Secondary | ICD-10-CM | POA: Diagnosis not present

## 2024-02-15 NOTE — Progress Notes (Signed)
 Specialty Pharmacy Refill Coordination Note  Dawn [name] is a 53 y.o. female contacted today regarding refills of specialty medication(s) Darunavir -Cobicistat  (Prezcobix ); Emtricitabine -Tenofovir  AF (Descovy )   Patient requested Delivery   Delivery date: 02/19/24   Verified address: [address] [name] [address]   Medication will be filled on 02/18/24.

## 2024-02-15 NOTE — Patient Instructions (Addendum)
 Thank you for visiting clinic today and allowing us  to participate in your care!  Today we discussed your bleeding symptoms and collected labwork.   -You are scheduled for a transvaginal US  at [facility] on Tuesday June 24th at 1:30 PM. Please call [phone] if you need to reschedule.  -We will schedule you for our colposcopy clinic at our office and let you know when your appointment is.  -We referred you to OBGYN. Someone will contact you for scheduling an appointment.  -We also placed a referral for you to get a colonoscopy done and someone will call you to schedule an appt.  -I will contact you with the results from today.   Reach out any time with any questions or concerns you may have - we are here for you!  Carey Chapman, MD [facility] [phone]

## 2024-02-15 NOTE — Progress Notes (Signed)
    SUBJECTIVE:   CHIEF COMPLAINT / HPI:   AUB  Anemia - Recently seen in ED for heavy bleeding, using many pads throughout day with large clots - was given megace , iron , and recommended close OBGYN fu  - Has been taking Megace  once daily and iron , has not yet been connected with OBGYN - Remains tired, with SOB at times - Bleeding has been improving, now about a typical light period   - No black or bloody stools, no hemoptysis - Has never had colonoscopy   PERTINENT  PMH / PSH: AUB, Uterine fibroids, Anemia, HIV  OBJECTIVE:   BP 128/78   Pulse 76   Ht 5' 4 (1.626 m)   Wt 157 lb (71.2 kg)   SpO2 99%   BMI 26.95 kg/m   General: Well-appearing. Resting comfortably in room. CV: Normal S1/S2. No extra heart sounds. Warm and well-perfused. Pulm: Breathing comfortably on room air. CTAB. No increased WOB. Abd: Soft, non-distended. No rebound or guarding.  Skin:  Warm, dry. GU: Normal appearing cervix and vaginal wall.   GU exam chaperoned by CMA.   ASSESSMENT/PLAN:   Assessment & Plan Abnormal uterine bleeding Hx ASCUS (2024) in HIV patient. Hx uterine fibroids, last noted on imaging in 2023.  - Pap smear collected today  - Discussed TVUS and scheduled  - Discussed following in [facility] colpo clinic - message sent for scheduling  - Placed referral to OBGYN - CBC, iron  panel today  - Cont Megace  40 daily and iron  daily  Anemia, unspecified type - Per AUB plan above - Additionally, colonoscopy ordered  Screen for STD (sexually transmitted disease) Per patient request, collected routine STI/STD testing today in addition to pap smear.    RTC pending workup above.   Damien Cassis, MD [facility]

## 2024-02-16 LAB — RPR: RPR Ser Ql: NONREACTIVE

## 2024-02-16 LAB — IRON,TIBC AND FERRITIN PANEL
Ferritin: 26 ng/mL (ref 15–150)
Iron Saturation: 84 % (ref 15–55)
Iron: 331 ug/dL (ref 27–159)
Total Iron Binding Capacity: 392 ug/dL (ref 250–450)
UIBC: 61 ug/dL — ABNORMAL LOW (ref 131–425)

## 2024-02-16 LAB — CBC
Hematocrit: 29.2 % — ABNORMAL LOW (ref 34.0–46.6)
Hemoglobin: 8.5 g/dL — ABNORMAL LOW (ref 11.1–15.9)
MCH: 23.5 pg — ABNORMAL LOW (ref 26.6–33.0)
MCHC: 29.1 g/dL — ABNORMAL LOW (ref 31.5–35.7)
MCV: 81 fL (ref 79–97)
Platelets: 395 10*3/uL (ref 150–450)
RBC: 3.61 x10E6/uL — ABNORMAL LOW (ref 3.77–5.28)
RDW: 16 % — ABNORMAL HIGH (ref 11.7–15.4)
WBC: 3.4 10*3/uL (ref 3.4–10.8)

## 2024-02-16 NOTE — Assessment & Plan Note (Signed)
-   Per AUB plan above - Additionally, colonoscopy ordered

## 2024-02-18 ENCOUNTER — Other Ambulatory Visit: Payer: Self-pay

## 2024-02-18 ENCOUNTER — Ambulatory Visit: Payer: Self-pay | Admitting: Family Medicine

## 2024-02-18 ENCOUNTER — Emergency Department (HOSPITAL_COMMUNITY)
Admission: EM | Admit: 2024-02-18 | Discharge: 2024-02-19 | Disposition: A | Discharge status: Home / Self Care | Payer: MEDICAID | Attending: Emergency Medicine | Admitting: Emergency Medicine

## 2024-02-18 ENCOUNTER — Emergency Department (HOSPITAL_COMMUNITY): Payer: MEDICAID

## 2024-02-18 ENCOUNTER — Telehealth: Payer: Self-pay | Admitting: *Deleted

## 2024-02-18 ENCOUNTER — Encounter (HOSPITAL_COMMUNITY): Payer: Self-pay | Admitting: Emergency Medicine

## 2024-02-18 DIAGNOSIS — N939 Abnormal uterine and vaginal bleeding, unspecified: Secondary | ICD-10-CM | POA: Diagnosis present

## 2024-02-18 DIAGNOSIS — R509 Fever, unspecified: Secondary | ICD-10-CM | POA: Diagnosis present

## 2024-02-18 DIAGNOSIS — A084 Viral intestinal infection, unspecified: Secondary | ICD-10-CM | POA: Insufficient documentation

## 2024-02-18 LAB — CBC WITH DIFFERENTIAL/PLATELET
Abs Immature Granulocytes: 0.03 10*3/uL (ref 0.00–0.07)
Basophils Absolute: 0 10*3/uL (ref 0.0–0.1)
Basophils Relative: 0 %
Eosinophils Absolute: 0 10*3/uL (ref 0.0–0.5)
Eosinophils Relative: 0 %
HCT: 27.8 % — ABNORMAL LOW (ref 36.0–46.0)
Hemoglobin: 8.5 g/dL — ABNORMAL LOW (ref 12.0–15.0)
Immature Granulocytes: 0 %
Lymphocytes Relative: 8 %
Lymphs Abs: 0.7 10*3/uL (ref 0.7–4.0)
MCH: 24.1 pg — ABNORMAL LOW (ref 26.0–34.0)
MCHC: 30.6 g/dL (ref 30.0–36.0)
MCV: 79 fL — ABNORMAL LOW (ref 80.0–100.0)
Monocytes Absolute: 0.4 10*3/uL (ref 0.1–1.0)
Monocytes Relative: 5 %
Neutro Abs: 8.1 10*3/uL — ABNORMAL HIGH (ref 1.7–7.7)
Neutrophils Relative %: 87 %
Platelets: 331 10*3/uL (ref 150–400)
RBC: 3.52 MIL/uL — ABNORMAL LOW (ref 3.87–5.11)
RDW: 18.6 % — ABNORMAL HIGH (ref 11.5–15.5)
WBC: 9.3 10*3/uL (ref 4.0–10.5)
nRBC: 0 % (ref 0.0–0.2)

## 2024-02-18 LAB — PROTIME-INR
INR: 1.1 (ref 0.8–1.2)
Prothrombin Time: 14.1 s (ref 11.4–15.2)

## 2024-02-18 LAB — COMPREHENSIVE METABOLIC PANEL WITH GFR
ALT: 13 U/L (ref 0–44)
AST: 26 U/L (ref 15–41)
Albumin: 3.7 g/dL (ref 3.5–5.0)
Alkaline Phosphatase: 42 U/L (ref 38–126)
Anion gap: 9 (ref 5–15)
BUN: 16 mg/dL (ref 6–20)
CO2: 21 mmol/L — ABNORMAL LOW (ref 22–32)
Calcium: 8.5 mg/dL — ABNORMAL LOW (ref 8.9–10.3)
Chloride: 107 mmol/L (ref 98–111)
Creatinine, Ser: 0.91 mg/dL (ref 0.44–1.00)
GFR, Estimated: >60 mL/min (ref >60)
Glucose, Bld: 108 mg/dL — ABNORMAL HIGH (ref 70–99)
Potassium: 3.2 mmol/L — ABNORMAL LOW (ref 3.5–5.1)
Sodium: 137 mmol/L (ref 135–145)
Total Bilirubin: 0.9 mg/dL (ref 0.0–1.2)
Total Protein: 7.2 g/dL (ref 6.5–8.1)

## 2024-02-18 LAB — I-STAT CG4 LACTIC ACID, ED: Lactic Acid, Venous: 1.4 mmol/L (ref 0.5–1.9)

## 2024-02-18 MED ORDER — SODIUM CHLORIDE 0.9 % IV BOLUS
1000.0000 mL | Freq: Once | INTRAVENOUS | Status: AC
  Administered 2024-02-19: 1000 mL via INTRAVENOUS

## 2024-02-18 MED ORDER — IBUPROFEN 200 MG PO TABS
600.0000 mg | ORAL_TABLET | Freq: Once | ORAL | Status: AC
  Administered 2024-02-19: 600 mg via ORAL
  Filled 2024-02-18: qty 3

## 2024-02-18 NOTE — ED Triage Notes (Signed)
 Patient c/o abdominal pain x 2 day. Patient report worsening abdominal pain tonight. Patient report unable to keep anything down. Patient report nausea, vomiting x 1 today. Patient denies Diarrhea. Patient denies chest pain and SOB.

## 2024-02-18 NOTE — ED Provider Notes (Signed)
 [place] EMERGENCY DEPARTMENT AT [facility] Provider Note  CSN: [id number] Arrival date & time: 02/18/24 2235  Chief Complaint(s) Abdominal Pain  HPI Dawn [name] is a 53 y.o. 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 Authorizing Provider  albuterol  (VENTOLIN  HFA) 108 (90 Base) MCG/ACT inhaler Inhale 1 puff into the lungs every 6 (six) hours as needed for wheezing or shortness of breath. Patient not taking: Reported on 08/16/2023 06/14/21   Blaise Aleene KIDD, MD  cyclobenzaprine  (FLEXERIL ) 10 MG tablet Take 1 tablet (10 mg total) by mouth 2 (two) times daily as needed for muscle spasms. Patient not taking: Reported on 08/16/2023 06/07/23   Silver Fell A, PA  darunavir -cobicistat  (PREZCOBIX ) 800-150 MG tablet TAKE ONE TABLET BY MOUTH DAILY WITH BREAKFAST *SWALLOW WHOLE. DO NOT CRUSH, BREAK OR CHEW TABLETS* 02/14/24   Luiz Channel, MD  dicyclomine  (BENTYL ) 20 MG  tablet Take 1 tablet (20 mg total) by mouth 2 (two) times daily. Patient not taking: Reported on 08/16/2023 08/17/22   Raspet, Erin K, PA-C  diphenhydrAMINE -zinc  acetate (BENADRYL  EXTRA STRENGTH) cream Apply 1 Application topically 3 (three) times daily as needed for itching. Patient not taking: Reported on 08/16/2023 01/25/23   Kehrli, Kelsey F, PA-C  doxycycline  (VIBRA -TABS) 100 MG tablet Take 1 tablet (100 mg total) by mouth 2 (two) times daily. Take on full stomach Patient not taking: Reported on 08/16/2023 05/14/23   Luiz Channel, MD  emtricitabine -tenofovir  AF (DESCOVY ) 200-25 MG tablet Take 1 tablet by mouth daily. 02/14/24   Luiz Channel, MD  erythromycin  ophthalmic ointment Place a 1/2 inch ribbon of ointment into the lower eyelid. Patient not taking: Reported on 08/16/2023 11/16/22   Dreama, Georgia  N, FNP  ferrous sulfate  325 (65 FE) MG tablet Take 1 tablet (325 mg total) by mouth daily. 01/30/24   Smoot, Lauraine LABOR, PA-C  FLUoxetine  (PROZAC ) 20 MG capsule Take 1 capsule (20 mg total) by mouth daily. 03/15/23   Luiz Channel, MD  hydrochlorothiazide  (HYDRODIURIL ) 25 MG tablet Take 0.5 tablets (12.5 mg total) by mouth daily. 05/14/23   Luiz Channel, MD  loperamide  (IMODIUM ) 2 MG capsule Take 1 capsule (2 mg total) by mouth 4 (four) times daily as needed for diarrhea or loose stools. 10/19/23   Horton, Charmaine FALCON, MD  meloxicam  (MOBIC ) 15 MG tablet Take 1 tablet (15 mg) by mouth once a day as needed for pain and swelling. DO NOT TAKE AT THE SAME TIME AS IBUPROFEN  OR  CELEBREX Patient not taking: Reported on 08/16/2023 07/09/23     ondansetron  (ZOFRAN -ODT) 4 MG disintegrating tablet Dissolve 1 tablet under the tongue every 8 (eight) hours as needed for nausea or vomiting. Patient not taking: Reported on 08/16/2023 12/14/22   Tegeler, Lonni PARAS, MD  ondansetron  (ZOFRAN -ODT) 4 MG disintegrating tablet Take 1 tablet (4 mg total) by mouth every 8 (eight) hours as needed. 10/19/23   Horton,  Charmaine FALCON, MD  rosuvastatin  (CRESTOR ) 10 MG tablet Take 1 tablet (10 mg total) by mouth daily. 02/14/24   Luiz Channel, MD  tretinoin  (RETIN-A ) 0.05 % cream Apply topically at bedtime. Patient not taking: Reported on 08/16/2023 11/30/22   Nicholas Bar, MD  triamcinolone  (KENALOG ) 0.025 % ointment Apply 1 Application topically 2 (two) times daily as needed (apply to affected area). Patient not taking: Reported on 08/16/2023 01/23/23   Arloa Suzen RAMAN, NP  valACYclovir  (VALTREX ) 1000 MG tablet Take 1 tablet (1,000 mg total) by mouth daily. 01/23/24   Luiz Channel, MD  diphenhydrAMINE  (BENADRYL ) 25 MG tablet Take 1 tablet (25 mg total) by mouth every 6 (six) hours as needed for up to 3 days. 06/17/20 09/04/20  Woods, Jaclyn M, PA-C  famotidine  (PEPCID ) 20 MG tablet Take 1 tablet (20 mg total) by mouth 2 (two) times daily for 5 days. 06/17/20 09/04/20  Woods, Jaclyn M, PA-C                                                                                                                                    Allergies Chocolate and Tomato  Review of Systems Review of Systems  Gastrointestinal:  Positive for abdominal pain.   As noted in HPI  Physical Exam Vital Signs  I have reviewed the triage vital signs BP (!) 147/69 (BP Location: Left Arm)   Pulse (!) 123   Temp (!) 102.4 F (39.1 C) (Oral)   Resp (!) 23   LMP  (LMP Unknown)   SpO2 100%  *** Physical Exam  ED Results and Treatments Labs (all labs ordered are listed, but only abnormal results are displayed) Labs Reviewed  CBC WITH DIFFERENTIAL/PLATELET - Abnormal; Notable for the following components:      Result Value   RBC 3.52 (*)    Hemoglobin 8.5 (*)    HCT 27.8 (*)    MCV 79.0 (*)    MCH 24.1 (*)    RDW 18.6 (*)    Neutro Abs 8.1 (*)    All other components within normal limits  CULTURE, BLOOD (ROUTINE X 2)  CULTURE, BLOOD (ROUTINE X 2)  COMPREHENSIVE METABOLIC PANEL WITH GFR  PROTIME-INR  URINALYSIS, W/ REFLEX TO  CULTURE (INFECTION SUSPECTED)  HCG, SERUM, QUALITATIVE  I-STAT CG4 LACTIC ACID, ED  EKG  EKG Interpretation Date/Time:  Monday February 18 2024 22:53:08 EDT Ventricular Rate:  126 PR Interval:  122 QRS Duration:  76 QT Interval:  416 QTC Calculation: 603 R Axis:   78  Text Interpretation: Sinus tachycardia Anterior infarct, old ST elevation, consider inferior injury Prolonged QT interval Rate faster Confirmed by Trine Likes [phone]) on 02/18/2024 10:56:57 PM       Radiology DG Chest 2 View Result Date: 02/18/2024 CLINICAL DATA:  Suspected sepsis, chest pain, nausea, vomiting EXAM: CHEST - 2 VIEW COMPARISON:  09/03/2023 FINDINGS: The heart size and mediastinal contours are within normal limits. Both lungs are clear. The visualized skeletal structures are unremarkable. IMPRESSION: No active cardiopulmonary disease. Electronically Signed   By: Franky Crease M.D.   On: 02/18/2024 23:11    Medications Ordered in ED Medications - No data to display Procedures Procedures  (including critical care time) Medical Decision Making / ED Course   Medical Decision Making Amount and/or Complexity of Data Reviewed Labs: ordered. Radiology: ordered.    ***    Final Clinical Impression(s) / ED Diagnoses Final diagnoses:  None    This chart was dictated using voice recognition software.  Despite best efforts to proofread,  errors can occur which can change the documentation meaning.

## 2024-02-18 NOTE — Telephone Encounter (Signed)
 Attempted to call.  No answer and no machine.  Will have FO try again later. Harlene Carte, CMA

## 2024-02-18 NOTE — Telephone Encounter (Signed)
-----   Message from Damien Cassis sent at 02/15/2024  5:44 PM EDT ----- Regarding: New patient for colpo clinic Hi everyone!  Can we please schedule this patient for colpo clinic? Patient with hx of hiv, abnormal pap, and abnormal uterine bleeding. Dr Rosalynn is aware. Thanks so much!   Damien

## 2024-02-19 ENCOUNTER — Other Ambulatory Visit: Payer: Self-pay

## 2024-02-19 ENCOUNTER — Emergency Department (HOSPITAL_COMMUNITY): Payer: MEDICAID

## 2024-02-19 ENCOUNTER — Ambulatory Visit (HOSPITAL_COMMUNITY)
Admission: RE | Admit: 2024-02-19 | Discharge: 2024-02-19 | Disposition: A | Discharge status: Home / Self Care | Payer: MEDICAID | Source: Ambulatory Visit | Attending: Family Medicine | Admitting: Family Medicine

## 2024-02-19 ENCOUNTER — Other Ambulatory Visit (HOSPITAL_COMMUNITY): Payer: Self-pay

## 2024-02-19 DIAGNOSIS — N939 Abnormal uterine and vaginal bleeding, unspecified: Secondary | ICD-10-CM

## 2024-02-19 DIAGNOSIS — A084 Viral intestinal infection, unspecified: Secondary | ICD-10-CM | POA: Diagnosis not present

## 2024-02-19 LAB — URINALYSIS, W/ REFLEX TO CULTURE (INFECTION SUSPECTED)
Bacteria, UA: NONE SEEN
Bilirubin Urine: NEGATIVE
Glucose, UA: NEGATIVE mg/dL
Hgb urine dipstick: NEGATIVE
Ketones, ur: NEGATIVE mg/dL
Leukocytes,Ua: NEGATIVE
Nitrite: NEGATIVE
Protein, ur: NEGATIVE mg/dL
Specific Gravity, Urine: 1.025 (ref 1.005–1.030)
pH: 5 (ref 5.0–8.0)

## 2024-02-19 LAB — RESP PANEL BY RT-PCR (RSV, FLU A&B, COVID)  RVPGX2
Influenza A by PCR: NEGATIVE
Influenza B by PCR: NEGATIVE
Resp Syncytial Virus by PCR: NEGATIVE
SARS Coronavirus 2 by RT PCR: NEGATIVE

## 2024-02-19 LAB — I-STAT CG4 LACTIC ACID, ED: Lactic Acid, Venous: 0.9 mmol/L (ref 0.5–1.9)

## 2024-02-19 MED ORDER — ONDANSETRON 4 MG PO TBDP
4.0000 mg | ORAL_TABLET | Freq: Three times a day (TID) | ORAL | 0 refills | Status: AC | PRN
  Filled 2024-02-19: qty 15, 5d supply, fill #0

## 2024-02-19 MED ORDER — IOHEXOL 300 MG/ML  SOLN
100.0000 mL | Freq: Once | INTRAMUSCULAR | Status: AC | PRN
  Administered 2024-02-19: 100 mL via INTRAVENOUS

## 2024-02-19 MED ORDER — SODIUM CHLORIDE (PF) 0.9 % IJ SOLN
INTRAMUSCULAR | Status: AC
  Filled 2024-02-19: qty 50

## 2024-02-19 MED ORDER — METOCLOPRAMIDE HCL 5 MG/ML IJ SOLN
10.0000 mg | Freq: Once | INTRAMUSCULAR | Status: AC
  Administered 2024-02-19: 10 mg via INTRAVENOUS
  Filled 2024-02-19: qty 2

## 2024-02-19 MED ORDER — HYOSCYAMINE SULFATE 0.125 MG SL SUBL
0.1250 mg | SUBLINGUAL_TABLET | Freq: Four times a day (QID) | SUBLINGUAL | 0 refills | Status: AC | PRN
  Filled 2024-02-19: qty 30, 8d supply, fill #0

## 2024-02-19 NOTE — ED Notes (Signed)
 Patient ambulated to the bathroom.

## 2024-02-21 LAB — CULTURE, BLOOD (ROUTINE X 2)

## 2024-02-22 LAB — CYTOLOGY - PAP
Adequacy: ABSENT
Chlamydia: NEGATIVE
Comment: NEGATIVE
Comment: NEGATIVE
Comment: NEGATIVE
Comment: NORMAL
Diagnosis: NEGATIVE
High risk HPV: NEGATIVE
Neisseria Gonorrhea: NEGATIVE
Trichomonas: NEGATIVE

## 2024-02-24 LAB — CULTURE, BLOOD (ROUTINE X 2)
Culture: NO GROWTH
Culture: NO GROWTH
Special Requests: ADEQUATE

## 2024-02-25 ENCOUNTER — Telehealth: Payer: Self-pay | Admitting: Family Medicine

## 2024-02-25 ENCOUNTER — Telehealth: Payer: Self-pay

## 2024-02-25 ENCOUNTER — Other Ambulatory Visit: Payer: Self-pay | Admitting: Family Medicine

## 2024-02-25 MED ORDER — FERROUS SULFATE 325 (65 FE) MG PO TABS: 325.0000 mg | ORAL_TABLET | Freq: Every day | ORAL | 0 refills | Status: DC

## 2024-02-25 MED ORDER — MEGESTROL ACETATE 40 MG PO TABS
40.0000 mg | ORAL_TABLET | Freq: Every day | ORAL | 0 refills | Status: DC
Start: 2024-02-25 — End: 2024-02-25

## 2024-02-25 MED ORDER — MEGESTROL ACETATE 40 MG PO TABS: 40.0000 mg | ORAL_TABLET | Freq: Every day | ORAL | 0 refills | Status: DC

## 2024-02-25 NOTE — Telephone Encounter (Addendum)
 Called patient to discuss recent workup results including: - Mildly improving/stable anemia  - No concerning findings on pap smear  - Negative G/C, Trich, Syphilis results  - Fibroids and thickened endometrium seen on US  - discussed nature of these findings and how they may be contributing to her symptoms, and the need for further workup including colposcopy/endometrial biopsy and OBGYN follow up to better understand etiology and rule out cancerous causes  Patient reports she continues to not have bleeding on the daily Megace . Reports continued fatigue symptoms.   Discussed plan as follows: - Continue PO iron  daily or every other day as tolerated - refilled  - Continue Megace  40 daily - refilled  - OBGYN referral previously placed. Provided patient with [facility]'s phone number for appointment scheduling.  - Colpo clinic scheduled on 7/17 at [facility]  Questions were addressed and patient expressed understanding throughout conversation.

## 2024-02-25 NOTE — Progress Notes (Signed)
 Refilled patient Megace  and Iron  supplement to patient pharmacy.

## 2024-02-25 NOTE — Telephone Encounter (Signed)
 Patient calls nurse line requesting to speak with Dr. Diona. She states that she would like to further discuss her lab and ultrasound results. She is asking if she needs to have hysterectomy or if there is anyway that fibroids can be shrunk.   Advised that I would forward message to Dr. Diona. She is also asking for refill on Megace .   I scheduled patient in colpo clinic for 03/13/24.  Please return call to patient at [phone].  Chiquita JAYSON English, RN

## 2024-03-06 ENCOUNTER — Other Ambulatory Visit: Payer: Self-pay

## 2024-03-06 ENCOUNTER — Other Ambulatory Visit: Payer: Self-pay | Admitting: Internal Medicine

## 2024-03-06 ENCOUNTER — Other Ambulatory Visit (HOSPITAL_COMMUNITY): Payer: Self-pay

## 2024-03-06 DIAGNOSIS — B20 Human immunodeficiency virus [HIV] disease: Secondary | ICD-10-CM

## 2024-03-06 MED ORDER — PREZCOBIX 800-150 MG PO TABS
ORAL_TABLET | ORAL | 0 refills | Status: DC
  Filled 2024-03-06 – 2024-04-23 (×2): qty 30, 30d supply, fill #0

## 2024-03-06 MED ORDER — DESCOVY 200-25 MG PO TABS
1.0000 | ORAL_TABLET | Freq: Every day | ORAL | 0 refills | Status: DC
  Filled 2024-03-06 – 2024-04-23 (×2): qty 30, 30d supply, fill #0

## 2024-03-07 ENCOUNTER — Encounter: Payer: Self-pay | Admitting: Emergency Medicine

## 2024-03-07 ENCOUNTER — Other Ambulatory Visit: Payer: Self-pay

## 2024-03-07 ENCOUNTER — Ambulatory Visit
Admission: EM | Admit: 2024-03-07 | Discharge: 2024-03-07 | Disposition: A | Discharge status: Home / Self Care | Payer: MEDICAID | Attending: Physician Assistant | Admitting: Physician Assistant

## 2024-03-07 ENCOUNTER — Other Ambulatory Visit (HOSPITAL_COMMUNITY): Payer: Self-pay

## 2024-03-07 DIAGNOSIS — L729 Follicular cyst of the skin and subcutaneous tissue, unspecified: Secondary | ICD-10-CM

## 2024-03-07 DIAGNOSIS — L089 Local infection of the skin and subcutaneous tissue, unspecified: Secondary | ICD-10-CM

## 2024-03-07 LAB — POCT URINE PREGNANCY: Preg Test, Ur: NEGATIVE

## 2024-03-07 MED ORDER — DOXYCYCLINE HYCLATE 100 MG PO CAPS
100.0000 mg | ORAL_CAPSULE | Freq: Two times a day (BID) | ORAL | 0 refills | Status: DC
  Filled 2024-03-07 – 2024-03-08 (×2): qty 20, 10d supply, fill #0

## 2024-03-07 NOTE — ED Provider Notes (Signed)
 [facility]    CSN: [id number] Arrival date & time: 03/07/24  1429      History   Chief Complaint Chief Complaint  Patient presents with   Abscess    HPI Dawn [name] is a 53 y.o. female.   Patient presents today with a weeklong history of enlarging lesion on her left neck.  She reports that it has become painful and is currently rated 5 on a 0-8 pain scale, described as aching, no aggravating leaving factors identified.  She has had recurrent skin infections in the past but denies formal diagnosis of MRSA.  She believes that this began as an infected hair follicle and then has become larger.  She denies any recent antibiotics in the past 90 days.  She has not been take any over-the-counter medications.  She denies any recent hospitalization or surgical procedure.  She does have a history of HIV and was last seen by her infectious disease provider in April 2025 where she was detectable.  She denies any fever, nausea, vomiting.  She does not believe that she is pregnant but has not gone through menopause and so is open to testing.    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   Vaginal discharge 08/14/2022   Enlarged thyroid  gland 01/30/2022   Fibroids 07/12/2021   Need for prophylactic vaccination and inoculation against influenza 07/12/2021   Viral illness 01/08/2019   HIV disease (HCC) 03/05/2017   HTN (hypertension) 03/05/2017   Anemia 03/13/2016   Anemia due to chronic blood loss 03/13/2016   Acquired immune deficiency syndrome (HCC) 12/07/2015   Acne vulgaris 12/07/2015   Mood disorder (HCC) 12/07/2015    Past  Surgical History:  Procedure Laterality Date   CESAREAN SECTION     DILATION AND CURETTAGE OF UTERUS     HAND TENDON SURGERY     right thumb   INDUCED ABORTION     TUBAL LIGATION      OB History     Gravida  6   Para  4   Term  4   Preterm      AB  2   Living  4      SAB  1   IAB  1   Ectopic      Multiple      Live Births  4            Home Medications    Prior to Admission medications   Medication Sig Start Date End Date Taking? Authorizing Provider  doxycycline  (VIBRAMYCIN ) 100 MG capsule Take 1 capsule (100 mg total) by mouth 2 (two) times daily. 03/07/24  Yes Arilynn Blakeney K, PA-C  darunavir -cobicistat  (PREZCOBIX ) 800-150 MG tablet TAKE ONE TABLET BY MOUTH DAILY WITH BREAKFAST *SWALLOW WHOLE. DO NOT CRUSH, BREAK OR CHEW TABLETS* 03/06/24   Luiz Channel, MD  emtricitabine -tenofovir  AF (DESCOVY ) 200-25 MG tablet Take 1 tablet by mouth daily. 03/06/24   Luiz Channel, MD  ferrous sulfate  325 (65 FE) MG tablet Take 1 tablet (325 mg total) by mouth daily. 02/25/24   Diona Perkins, MD  FLUoxetine  (PROZAC )  20 MG capsule Take 1 capsule (20 mg total) by mouth daily. 03/15/23   Luiz Channel, MD  hydrochlorothiazide  (HYDRODIURIL ) 25 MG tablet Take 0.5 tablets (12.5 mg total) by mouth daily. 05/14/23   Luiz Channel, MD  hyoscyamine  (LEVSIN AMIEL) 0.125 MG SL tablet Place 1 tablet (0.125 mg total) under the tongue 4 (four) times daily as needed for up to 5 days. 02/19/24 02/27/24  Trine Raynell Moder, MD  loperamide  (IMODIUM ) 2 MG capsule Take 1 capsule (2 mg total) by mouth 4 (four) times daily as needed for diarrhea or loose stools. 10/19/23   Horton, Charmaine FALCON, MD  megestrol  (MEGACE ) 40 MG tablet Take 1 tablet (40 mg total) by mouth daily. 02/25/24   Diona Perkins, MD  rosuvastatin  (CRESTOR ) 10 MG tablet Take 1 tablet (10 mg total) by mouth daily. 02/14/24   Luiz Channel, MD  valACYclovir  (VALTREX ) 1000 MG tablet Take 1 tablet (1,000 mg total) by mouth daily. 01/23/24    Luiz Channel, MD  diphenhydrAMINE  (BENADRYL ) 25 MG tablet Take 1 tablet (25 mg total) by mouth every 6 (six) hours as needed for up to 3 days. 06/17/20 09/04/20  Woods, Jaclyn M, PA-C  famotidine  (PEPCID ) 20 MG tablet Take 1 tablet (20 mg total) by mouth 2 (two) times daily for 5 days. 06/17/20 09/04/20  Woods, Jaclyn M, PA-C    Family History Family History  Problem Relation Age of Onset   Breast cancer Neg Hx     Social History Social History   Tobacco Use   Smoking status: Former    Types: Cigarettes    Passive exposure: Never   Smokeless tobacco: Never  Vaping Use   Vaping status: Never Used  Substance Use Topics   Alcohol use: Not Currently    Comment: hx of alcohol abuse- social drinker now   Drug use: Not Currently    Types: Marijuana    Comment: none in 8 yrs     Allergies   Chocolate and Tomato   Review of Systems Review of Systems  Constitutional:  Positive for activity change. Negative for appetite change, fatigue and fever.  Gastrointestinal:  Negative for abdominal pain, diarrhea, nausea and vomiting.  Musculoskeletal:  Negative for arthralgias, myalgias and neck pain.  Skin:  Negative for color change and wound.  Neurological:  Negative for dizziness, light-headedness and headaches.     Physical Exam Triage Vital Signs ED Triage Vitals  Encounter Vitals Group     BP 03/07/24 1529 (!) 145/85     Girls Systolic BP Percentile --      Girls Diastolic BP Percentile --      Boys Systolic BP Percentile --      Boys Diastolic BP Percentile --      Pulse Rate 03/07/24 1529 (!) 107     Resp 03/07/24 1529 18     Temp 03/07/24 1529 98.8 F (37.1 C)     Temp Source 03/07/24 1529 Oral     SpO2 03/07/24 1529 98 %     Weight 03/07/24 1528 156 lb 15.5 oz (71.2 kg)     Height --      Head Circumference --      Peak Flow --      Pain Score 03/07/24 1526 4     Pain Loc --      Pain Education --      Exclude from Growth Chart --    No data  found.  Updated Vital Signs BP 137/79 (BP Location: Left Arm)  Pulse 73   Temp 98.8 F (37.1 C) (Oral)   Resp 18   Wt 156 lb 15.5 oz (71.2 kg)   LMP  (LMP Unknown)   SpO2 98%   BMI 26.94 kg/m   Visual Acuity Right Eye Distance:   Left Eye Distance:   Bilateral Distance:    Right Eye Near:   Left Eye Near:    Bilateral Near:     Physical Exam Vitals reviewed.  Constitutional:      General: She is awake. She is not in acute distress.    Appearance: Normal appearance. She is well-developed. She is not ill-appearing.     Comments: Very pleasant female appears stated age in no acute distress sitting comfortably in exam room  HENT:     Head: Normocephalic and atraumatic.  Cardiovascular:     Rate and Rhythm: Normal rate and regular rhythm.     Heart sounds: Normal heart sounds, S1 normal and S2 normal. No murmur heard. Pulmonary:     Effort: Pulmonary effort is normal.     Breath sounds: Normal breath sounds. No wheezing, rhonchi or rales.     Comments: Clear to auscultation bilaterally Abdominal:     Palpations: Abdomen is soft.     Tenderness: There is no abdominal tenderness.  Skin:    Findings: Lesion present.         Comments: 2 cm x 1 cm firm nodule noted left posterior neck with induration and without fluctuance.  No streaking or evidence of lymphangitis.  No bleeding or drainage noted.  Area is mildly tender to palpation.  Psychiatric:        Behavior: Behavior is cooperative.      UC Treatments / Results  Labs (all labs ordered are listed, but only abnormal results are displayed) Labs Reviewed  POCT URINE PREGNANCY - Normal    EKG   Radiology No results found.  Procedures Procedures (including critical care time)  Medications Ordered in UC Medications - No data to display  Initial Impression / Assessment and Plan / UC Course  I have reviewed the triage vital signs and the nursing notes.  Pertinent labs & imaging results that were available  during my care of the patient were reviewed by me and considered in my medical decision making (see chart for details).     Patient is well-appearing, afebrile, nontoxic, nontachycardic.  Patient has what appears to be an infected skin cyst.  There is no significant fluctuance and so I&D was deferred.  Will treat with doxycycline .  We discussed that this covers for MRSA but that if the lesion does not improve with antibiotics we may need to consider I&D in the future.  Recommend that she keep the area clean and use warm compresses several times per day.  She is to avoid prolonged sun exposure while on doxycycline  due to associated photosensitivity.  Recommend she follow-up closely with her primary care.  If she has any worsening or changing symptoms including fever, nausea, vomiting, visual change, headache she is to be seen emergently.  Strict return precautions given.  All questions answered to patient satisfaction.  Final Clinical Impressions(s) / UC Diagnoses   Final diagnoses:  Infected cyst of skin     Discharge Instructions      I believe this is a cyst that is infected.  Start doxycycline  100 mg twice daily for 10 days.  Use warm compresses on this area.  Take Tylenol  for pain relief.  If this area becomes  larger, more painful, you develop additional symptoms including fever, neck pain, neck stiffness, nausea, vomiting, vision change, headache you need to be seen immediately.  Follow-up with your primary care first thing next week for recheck.     ED Prescriptions     Medication Sig Dispense Auth. Provider   doxycycline  (VIBRAMYCIN ) 100 MG capsule Take 1 capsule (100 mg total) by mouth 2 (two) times daily. 20 capsule Aishia Barkey K, PA-C      PDMP not reviewed this encounter.   Sherrell Rocky POUR, PA-C 03/07/24 1631

## 2024-03-07 NOTE — ED Triage Notes (Signed)
 Pt presents c/o abscess located in back of her head. Pt says it started as a hair bump and is now an abscess that is painful.

## 2024-03-07 NOTE — Discharge Instructions (Signed)
 I believe this is a cyst that is infected.  Start doxycycline  100 mg twice daily for 10 days.  Use warm compresses on this area.  Take Tylenol  for pain relief.  If this area becomes larger, more painful, you develop additional symptoms including fever, neck pain, neck stiffness, nausea, vomiting, vision change, headache you need to be seen immediately.  Follow-up with your primary care first thing next week for recheck.

## 2024-03-08 ENCOUNTER — Other Ambulatory Visit: Payer: Self-pay

## 2024-03-08 ENCOUNTER — Other Ambulatory Visit (HOSPITAL_COMMUNITY): Payer: Self-pay

## 2024-03-10 ENCOUNTER — Other Ambulatory Visit: Payer: Self-pay

## 2024-03-12 ENCOUNTER — Encounter: Payer: Self-pay | Admitting: Gastroenterology

## 2024-03-12 ENCOUNTER — Ambulatory Visit: Payer: MEDICAID | Admitting: Internal Medicine

## 2024-03-13 ENCOUNTER — Ambulatory Visit: Payer: MEDICAID

## 2024-03-25 ENCOUNTER — Other Ambulatory Visit (HOSPITAL_COMMUNITY): Payer: Self-pay

## 2024-04-01 ENCOUNTER — Other Ambulatory Visit (HOSPITAL_COMMUNITY): Payer: Self-pay

## 2024-04-02 ENCOUNTER — Ambulatory Visit: Payer: MEDICAID | Admitting: Family Medicine

## 2024-04-23 ENCOUNTER — Other Ambulatory Visit: Payer: Self-pay

## 2024-04-23 NOTE — Progress Notes (Signed)
 Clinical Intervention Note  Clinical Intervention Notes: Patient reported taking medication consistently for 3 days and then stopping for brief periods. Counseled on importance of adherence. Stated felt medication makes her feel weak and the tablets are too big. Patient demonstrated understanding of the importance of adherence and stated she was doing the best she could to take it.   Clinical Intervention Outcomes: Improved therapy adherence; Improved therapy effectiveness   Powell CHRISTELLA [name] Specialty Pharmacist

## 2024-04-23 NOTE — Progress Notes (Signed)
 Specialty Pharmacy Ongoing Clinical Assessment Note  Dawn [name] is a 53 y.o. female who is being followed by the specialty pharmacy service for RxSp HIV   Patient's specialty medication(s) reviewed today: Darunavir -Cobicistat  (Prezcobix ); Emtricitabine -Tenofovir  AF (Descovy )   Missed doses in the last 4 weeks: More than 5   Patient/Caregiver did not have any additional questions or concerns.   Therapeutic benefit summary: Patient is achieving benefit   Adverse events/side effects summary: No adverse events/side effects   Patient's therapy is appropriate to: Continue    Goals Addressed             This Visit's Progress    Achieve Undetectable HIV Viral Load < 20   On track    Patient is on track. Patient will maintain adherence. Last viral load was undetectable as of 11/28/2023.         Follow up: 12 months  Dawn [name] [name] Specialty Pharmacist

## 2024-04-23 NOTE — Progress Notes (Signed)
 Specialty Pharmacy Refill Coordination Note  Buelah Rennie is a 53 y.o. female contacted today regarding refills of specialty medication(s) Darunavir -Cobicistat  (Prezcobix ); Emtricitabine -Tenofovir  AF (Descovy )   Patient requested Delivery   Delivery date: 04/29/24   Verified address: [address] [place] [address]   Medication will be filled on 04/25/2024.

## 2024-04-25 ENCOUNTER — Encounter: Payer: Self-pay | Admitting: Internal Medicine

## 2024-04-25 ENCOUNTER — Ambulatory Visit (INDEPENDENT_AMBULATORY_CARE_PROVIDER_SITE_OTHER): Payer: MEDICAID | Admitting: Internal Medicine

## 2024-04-25 ENCOUNTER — Other Ambulatory Visit: Payer: Self-pay

## 2024-04-25 VITALS — BP 118/84 | HR 78 | Temp 97.5°F | Ht 64.0 in | Wt 158.0 lb

## 2024-04-25 DIAGNOSIS — Z91199 Patient's noncompliance with other medical treatment and regimen due to unspecified reason: Secondary | ICD-10-CM

## 2024-04-25 DIAGNOSIS — B20 Human immunodeficiency virus [HIV] disease: Secondary | ICD-10-CM | POA: Diagnosis not present

## 2024-04-25 DIAGNOSIS — I10 Essential (primary) hypertension: Secondary | ICD-10-CM

## 2024-04-25 DIAGNOSIS — Z79899 Other long term (current) drug therapy: Secondary | ICD-10-CM

## 2024-04-25 NOTE — Progress Notes (Signed)
 RFV: follow up for hiv disease  Patient ID: Dawn [name], female   DOB: 1971-05-06, 53 y.o.   MRN: [id number]  HPI 53yo F with well controlled hiv disease, Currentlyon on descovy /DRVc, where she was undetectable in April. She reports to be in good health. She does report having difficulties taking medication daily.  Outpatient Encounter Medications as of 04/25/2024  Medication Sig   darunavir -cobicistat  (PREZCOBIX ) 800-150 MG tablet TAKE ONE TABLET BY MOUTH DAILY WITH BREAKFAST *SWALLOW WHOLE. DO NOT CRUSH, BREAK OR CHEW TABLETS*   emtricitabine -tenofovir  AF (DESCOVY ) 200-25 MG tablet Take 1 tablet by mouth daily.   ferrous sulfate  325 (65 FE) MG tablet Take 1 tablet (325 mg total) by mouth daily.   hydrochlorothiazide  (HYDRODIURIL ) 25 MG tablet Take 0.5 tablets (12.5 mg total) by mouth daily.   rosuvastatin  (CRESTOR ) 10 MG tablet Take 1 tablet (10 mg total) by mouth daily.   doxycycline  (VIBRAMYCIN ) 100 MG capsule Take 1 capsule (100 mg total) by mouth 2 (two) times daily for 10 days   FLUoxetine  (PROZAC ) 20 MG capsule Take 1 capsule (20 mg total) by mouth daily.   hyoscyamine  (LEVSIN /SL) 0.125 MG SL tablet Place 1 tablet (0.125 mg total) under the tongue 4 (four) times daily as needed for up to 5 days.   loperamide  (IMODIUM ) 2 MG capsule Take 1 capsule (2 mg total) by mouth 4 (four) times daily as needed for diarrhea or loose stools.   megestrol  (MEGACE ) 40 MG tablet Take 1 tablet (40 mg total) by mouth daily.   valACYclovir  (VALTREX ) 1000 MG tablet Take 1 tablet (1,000 mg total) by mouth daily.   [DISCONTINUED] diphenhydrAMINE  (BENADRYL ) 25 MG tablet Take 1 tablet (25 mg total) by mouth every 6 (six) hours as needed for up to 3 days.   [DISCONTINUED] famotidine  (PEPCID ) 20 MG tablet Take 1 tablet (20 mg total) by mouth 2 (two) times daily for 5 days.   No facility-administered encounter medications on file as of 04/25/2024.     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   Vaginal discharge 08/14/2022   Enlarged thyroid  gland 01/30/2022   Fibroids 07/12/2021   Need for prophylactic vaccination and inoculation against influenza 07/12/2021   Viral illness 01/08/2019   HIV disease (HCC) 03/05/2017   HTN (hypertension) 03/05/2017   Anemia 03/13/2016   Anemia due to chronic blood loss 03/13/2016   Acquired immune deficiency syndrome (HCC) 12/07/2015   Acne vulgaris 12/07/2015   Mood disorder (HCC) 12/07/2015     Health Maintenance Due  Topic Date Due   Hepatitis B Vaccines 19-59 Average Risk (1 of 3 - 19+ 3-dose series) Never done   Zoster Vaccines- Shingrix (1 of 2) Never done   Colonoscopy  Never done   Pneumococcal Vaccine: 50+ Years (3 of 3 - PCV20 or PCV21) 11/09/2021   COVID-19 Vaccine (3 - Pfizer risk series) 07/20/2022   MAMMOGRAM  04/14/2023   INFLUENZA VACCINE  03/28/2024     Review of Systems Review of Systems  Constitutional: Negative for fever, chills, diaphoresis, activity change, appetite change, fatigue and unexpected weight change.  HENT: Negative for congestion, sore throat, rhinorrhea, sneezing, trouble swallowing and sinus pressure.  Eyes: Negative for photophobia and visual disturbance.  Respiratory: Negative for cough, chest tightness, shortness of breath, wheezing and stridor.  Cardiovascular: Negative for chest pain, palpitations and leg swelling.  Gastrointestinal: Negative for nausea, vomiting, abdominal pain,  diarrhea, constipation, blood in stool, abdominal distention and anal bleeding.  Genitourinary: Negative for dysuria, hematuria, flank pain and difficulty urinating.  Musculoskeletal: Negative for myalgias, back pain, joint swelling, arthralgias and gait problem.  Skin: Negative for color change, pallor, rash and wound.  Neurological: Negative for dizziness, tremors, weakness and light-headedness.  Hematological:  Negative for adenopathy. Does not bruise/bleed easily.  Psychiatric/Behavioral: Negative for behavioral problems, confusion, sleep disturbance, dysphoric mood, decreased concentration and agitation.   Physical Exam   BP 118/84   Pulse 78   Temp (!) 97.5 F (36.4 C) (Temporal)   Ht 5' 4 (1.626 m)   Wt 158 lb (71.7 kg)   SpO2 99%   BMI 27.12 kg/m   Physical Exam  Constitutional:  oriented to person, place, and time. appears well-developed and well-nourished. No distress.  HENT: [place]/AT, PERRLA, no scleral icterus Mouth/Throat: Oropharynx is clear and moist. No oropharyngeal exudate.  Cardiovascular: Normal rate, regular rhythm and normal heart sounds. Exam reveals no gallop and no friction rub.  No murmur heard.  Pulmonary/Chest: Effort normal and breath sounds normal. No respiratory distress.  has no wheezes.  Neck = supple, no nuchal rigidity. Small lipoma Abdominal: Soft. Bowel sounds are normal.  exhibits no distension. There is no tenderness.  Lymphadenopathy: no cervical adenopathy. No axillary adenopathy Neurological: alert and oriented to person, place, and time.  Skin: Skin is warm and dry. No rash noted. No erythema.  Psychiatric: a normal mood and affect.  behavior is normal.   Lab Results  Component Value Date   CD4TCELL 46 11/28/2023   Lab Results  Component Value Date   CD4TABS 412 11/28/2023   CD4TABS 541 08/15/2023   CD4TABS 705 05/14/2023   Lab Results  Component Value Date   HIV1RNAQUANT <20 DETECTED (A) 11/28/2023   Lab Results  Component Value Date   HEPBSAB POS (A) 12/09/2015   Lab Results  Component Value Date   LABRPR Non Reactive 02/15/2024    CBC Lab Results  Component Value Date   WBC 9.3 02/18/2024   RBC 3.52 (L) 02/18/2024   HGB 8.5 (L) 02/18/2024   HCT 27.8 (L) 02/18/2024   PLT 331 02/18/2024   MCV 79.0 (L) 02/18/2024   MCH 24.1 (L) 02/18/2024   MCHC 30.6 02/18/2024   RDW 18.6 (H) 02/18/2024   LYMPHSABS 0.7 02/18/2024   MONOABS  0.4 02/18/2024   EOSABS 0.0 02/18/2024    BMET Lab Results  Component Value Date   NA 137 02/18/2024   K 3.2 (L) 02/18/2024   CL 107 02/18/2024   CO2 21 (L) 02/18/2024   GLUCOSE 108 (H) 02/18/2024   BUN 16 02/18/2024   CREATININE 0.91 02/18/2024   CALCIUM  8.5 (L) 02/18/2024   GFRNONAA >60 02/18/2024   GFRAA 87 02/02/2021      Assessment and Plan  Hiv disease= will check labs to see that she is undetectable. Will give refills  Long term medication management = will check cr  Adherence= increasing difficutly. Will check hiv genotype. Also spent 20 min with adherence counseling  Hypertension = well controlled. No change to her current regimen  I have personally spent 35  minutes involved in face-to-face and non-face-to-face activities for this patient on the day of the visit. Professional time spent includes the following activities: Preparing to see the patient (review of tests), Obtaining and/or reviewing separately obtained history (admission/discharge record), Performing a medically appropriate examination and/or evaluation , Ordering medications/tests/procedures, referring and communicating with other health care professionals, Documenting  clinical information in the EMR, Independently interpreting results (not separately reported), Communicating results to the patient/family/caregiver, Counseling and educating the patient/family/caregiver and Care coordination (not separately reported).

## 2024-04-28 ENCOUNTER — Other Ambulatory Visit: Payer: Self-pay

## 2024-04-28 ENCOUNTER — Emergency Department (HOSPITAL_COMMUNITY)
Admission: EM | Admit: 2024-04-28 | Discharge: 2024-04-28 | Disposition: A | Discharge status: Home / Self Care | Attending: Emergency Medicine | Admitting: Emergency Medicine

## 2024-04-28 ENCOUNTER — Encounter (HOSPITAL_COMMUNITY): Payer: Self-pay

## 2024-04-28 DIAGNOSIS — I10 Essential (primary) hypertension: Secondary | ICD-10-CM | POA: Insufficient documentation

## 2024-04-28 DIAGNOSIS — N939 Abnormal uterine and vaginal bleeding, unspecified: Secondary | ICD-10-CM | POA: Insufficient documentation

## 2024-04-28 DIAGNOSIS — R109 Unspecified abdominal pain: Secondary | ICD-10-CM | POA: Insufficient documentation

## 2024-04-28 LAB — CBC WITH DIFFERENTIAL/PLATELET
Abs Immature Granulocytes: 0.01 K/uL (ref 0.00–0.07)
Basophils Absolute: 0 K/uL (ref 0.0–0.1)
Basophils Relative: 0 %
Eosinophils Absolute: 0 K/uL (ref 0.0–0.5)
Eosinophils Relative: 0 %
HCT: 33.2 % — ABNORMAL LOW (ref 36.0–46.0)
Hemoglobin: 10.1 g/dL — ABNORMAL LOW (ref 12.0–15.0)
Immature Granulocytes: 0 %
Lymphocytes Relative: 33 %
Lymphs Abs: 1.1 K/uL (ref 0.7–4.0)
MCH: 24.2 pg — ABNORMAL LOW (ref 26.0–34.0)
MCHC: 30.4 g/dL (ref 30.0–36.0)
MCV: 79.4 fL — ABNORMAL LOW (ref 80.0–100.0)
Monocytes Absolute: 0.4 K/uL (ref 0.1–1.0)
Monocytes Relative: 12 %
Neutro Abs: 1.9 K/uL (ref 1.7–7.7)
Neutrophils Relative %: 55 %
Platelets: 231 K/uL (ref 150–400)
RBC: 4.18 MIL/uL (ref 3.87–5.11)
RDW: 19.1 % — ABNORMAL HIGH (ref 11.5–15.5)
WBC: 3.4 K/uL — ABNORMAL LOW (ref 4.0–10.5)
nRBC: 0 % (ref 0.0–0.2)

## 2024-04-28 LAB — URINALYSIS, ROUTINE W REFLEX MICROSCOPIC
Bacteria, UA: NONE SEEN
Bilirubin Urine: NEGATIVE
Glucose, UA: NEGATIVE mg/dL
Ketones, ur: NEGATIVE mg/dL
Leukocytes,Ua: NEGATIVE
Nitrite: NEGATIVE
Protein, ur: NEGATIVE mg/dL
Specific Gravity, Urine: 1.024 (ref 1.005–1.030)
pH: 6 (ref 5.0–8.0)

## 2024-04-28 LAB — COMPREHENSIVE METABOLIC PANEL WITH GFR
ALT: 12 U/L (ref 0–44)
AST: 19 U/L (ref 15–41)
Albumin: 4.1 g/dL (ref 3.5–5.0)
Alkaline Phosphatase: 55 U/L (ref 38–126)
Anion gap: 11 (ref 5–15)
BUN: 9 mg/dL (ref 6–20)
CO2: 23 mmol/L (ref 22–32)
Calcium: 9.1 mg/dL (ref 8.9–10.3)
Chloride: 105 mmol/L (ref 98–111)
Creatinine, Ser: 0.93 mg/dL (ref 0.44–1.00)
GFR, Estimated: >60 mL/min (ref >60)
Glucose, Bld: 85 mg/dL (ref 70–99)
Potassium: 3.6 mmol/L (ref 3.5–5.1)
Sodium: 138 mmol/L (ref 135–145)
Total Bilirubin: 0.4 mg/dL (ref 0.0–1.2)
Total Protein: 7.2 g/dL (ref 6.5–8.1)

## 2024-04-28 LAB — WET PREP, GENITAL
Clue Cells Wet Prep HPF POC: NONE SEEN
Sperm: NONE SEEN
Trich, Wet Prep: NONE SEEN
WBC, Wet Prep HPF POC: <10 (ref <10)
Yeast Wet Prep HPF POC: NONE SEEN

## 2024-04-28 LAB — HCG, QUANTITATIVE, PREGNANCY: hCG, Beta Chain, Quant, S: <1 m[IU]/mL (ref <5)

## 2024-04-28 MED ORDER — MEGESTROL ACETATE 40 MG PO TABS
ORAL_TABLET | ORAL | 0 refills | Status: DC
  Filled 2024-04-28: qty 34, fill #0

## 2024-04-28 MED ORDER — MEGESTROL ACETATE 40 MG PO TABS
80.0000 mg | ORAL_TABLET | Freq: Once | ORAL | Status: AC
  Administered 2024-04-28: 80 mg via ORAL
  Filled 2024-04-28: qty 2

## 2024-04-28 MED ORDER — FERROUS SULFATE 325 (65 FE) MG PO TABS: 325.0000 mg | ORAL_TABLET | Freq: Every day | ORAL | 0 refills | Status: DC

## 2024-04-28 MED ORDER — FERROUS SULFATE 325 (65 FE) MG PO TABS
325.0000 mg | ORAL_TABLET | Freq: Every day | ORAL | 0 refills | Status: DC
  Filled 2024-04-28: qty 30, 30d supply, fill #0

## 2024-04-28 MED ORDER — FERROUS SULFATE 325 (65 FE) MG PO TABS
325.0000 mg | ORAL_TABLET | Freq: Once | ORAL | Status: AC
  Administered 2024-04-28: 325 mg via ORAL
  Filled 2024-04-28: qty 1

## 2024-04-28 MED ORDER — MEGESTROL ACETATE 40 MG PO TABS: ORAL_TABLET | ORAL | 0 refills | Status: DC

## 2024-04-28 NOTE — ED Notes (Signed)
 Pt went to the bathroom and called this nurse to the bathroom. Pt had paper towels soaked with blood and a few quarter size clots. Notified charge Electronics engineer.

## 2024-04-28 NOTE — ED Triage Notes (Signed)
 Pt bib EMS from home. She is co abd pain and vaginal bleeding that started this morning. Clot size of golf ball. 3 pregnancy test came back negative.  BP 130/82  HR 86  O2 100% RA  97 CBG

## 2024-04-28 NOTE — Discharge Instructions (Addendum)
 You were seen in the ER for vaginal bleeding.  Your blood work and exam looked reassuring. I want you to restart megace  and iron  supplements and see the OBGYN. We gave you the first doses of iron  and megace  and I sent the rest to [facility]. They are closed today but will open again tomorrow.   I have attached the contact information for one of the clinics we work with. Please call the OBGYN office and make a follow up appointment.   Please return to the ER for persistent bleeding despite medication or for feelings of weakness or shortness of breath.

## 2024-04-28 NOTE — ED Provider Notes (Cosign Needed Addendum)
 [place] EMERGENCY DEPARTMENT AT [facility] Provider Note   CSN: [id number] Arrival date & time: 04/28/24  1029     Patient presents with: Abdominal Pain and Vaginal Bleeding   Dawn [name] is a 53 y.o. female who presents to the ED with complaint of abdominal pain and vaginal bleeding that started this morning. She reports passing a clot approximately the size of a golf ball. She has taken 3 pregnancy tests that have been negative. She took some ibuprofen  this morning. Her pain somewhat improved and she has had some minimal spotting since.   She has a history of uterine fibroids and abnormal uterine bleeding. Most recently had a CT scan and ultrasound in the ED on 6/24 with no emergent findings. She has history of HIV and follows with infectious disease, her HIV RNA and CD4 count is pending as of 8/30. She reports she has been compliant with her HIV meds. She has not seen an OBGYN in many years but would like to see one.   Abdominal Pain Associated symptoms: vaginal bleeding   Vaginal Bleeding Associated symptoms: abdominal pain        Prior to Admission medications   Medication Sig Start Date End Date Taking? Authorizing Provider  darunavir -cobicistat  (PREZCOBIX ) 800-150 MG tablet TAKE ONE TABLET BY MOUTH DAILY WITH BREAKFAST *SWALLOW WHOLE. DO NOT CRUSH, BREAK OR CHEW TABLETS* 03/06/24   Luiz Channel, MD  emtricitabine -tenofovir  AF (DESCOVY ) 200-25 MG tablet Take 1 tablet by mouth daily. 03/06/24   Luiz Channel, MD  ferrous sulfate  325 (65 FE) MG tablet Take 1 tablet (325 mg total) by mouth daily. 04/28/24 05/28/24  Siniyah Evangelist T, PA-C  FLUoxetine  (PROZAC ) 20 MG capsule Take 1 capsule (20 mg total) by mouth daily. 03/15/23   Luiz Channel, MD  hydrochlorothiazide  (HYDRODIURIL ) 25 MG tablet Take 0.5 tablets (12.5 mg total) by mouth daily. 05/14/23   Luiz Channel, MD  hyoscyamine  (LEVSIN AMIEL) 0.125 MG SL tablet Place 1 tablet (0.125 mg total) under the tongue 4  (four) times daily as needed for up to 5 days. 02/19/24 02/27/24  Trine Raynell Moder, MD  loperamide  (IMODIUM ) 2 MG capsule Take 1 capsule (2 mg total) by mouth 4 (four) times daily as needed for diarrhea or loose stools. 10/19/23   Horton, Charmaine FALCON, MD  megestrol  (MEGACE ) 40 MG tablet Take 2 tablets once daily for 4 days, then 1 tablet daily. 04/28/24   Kashena Novitski T, PA-C  rosuvastatin  (CRESTOR ) 10 MG tablet Take 1 tablet (10 mg total) by mouth daily. 02/14/24   Luiz Channel, MD  valACYclovir  (VALTREX ) 1000 MG tablet Take 1 tablet (1,000 mg total) by mouth daily. 01/23/24   Luiz Channel, MD  diphenhydrAMINE  (BENADRYL ) 25 MG tablet Take 1 tablet (25 mg total) by mouth every 6 (six) hours as needed for up to 3 days. 06/17/20 09/04/20  Woods, Jaclyn M, PA-C  famotidine  (PEPCID ) 20 MG tablet Take 1 tablet (20 mg total) by mouth 2 (two) times daily for 5 days. 06/17/20 09/04/20  Woods, Jaclyn M, PA-C    Allergies: Chocolate and Tomato    Review of Systems  Gastrointestinal:  Positive for abdominal pain.  Genitourinary:  Positive for vaginal bleeding.    Updated Vital Signs BP 139/81   Pulse 94   Temp 97.7 F (36.5 C)   Resp 18   Ht 5' 4 (1.626 m)   Wt 71.7 kg   LMP  (LMP Unknown)   SpO2 100%   BMI 27.12 kg/m  Physical Exam Vitals and nursing note reviewed. Exam conducted with a chaperone present.  Constitutional:      Appearance: Normal appearance.  HENT:     Head: Normocephalic and atraumatic.  Eyes:     Conjunctiva/sclera: Conjunctivae normal.  Cardiovascular:     Rate and Rhythm: Normal rate and regular rhythm.  Pulmonary:     Effort: Pulmonary effort is normal. No respiratory distress.     Breath sounds: Normal breath sounds.  Abdominal:     General: There is no distension.     Palpations: Abdomen is soft.     Tenderness: There is no abdominal tenderness.  Genitourinary:    General: Normal vulva.     Exam position: Lithotomy position.     Vagina: Bleeding  present.     Cervix: Cervical bleeding present. No cervical motion tenderness.     Uterus: Normal.      Adnexa: Right adnexa normal and left adnexa normal.      Comments: Thick clot visualized, no lesions Skin:    General: Skin is warm and dry.  Neurological:     General: No focal deficit present.     Mental Status: She is alert.     (all labs ordered are listed, but only abnormal results are displayed) Labs Reviewed  CBC WITH DIFFERENTIAL/PLATELET - Abnormal; Notable for the following components:      Result Value   WBC 3.4 (*)    Hemoglobin 10.1 (*)    HCT 33.2 (*)    MCV 79.4 (*)    MCH 24.2 (*)    RDW 19.1 (*)    All other components within normal limits  URINALYSIS, ROUTINE W REFLEX MICROSCOPIC - Abnormal; Notable for the following components:   Hgb urine dipstick MODERATE (*)    All other components within normal limits  WET PREP, GENITAL  COMPREHENSIVE METABOLIC PANEL WITH GFR  HCG, QUANTITATIVE, PREGNANCY  GC/CHLAMYDIA PROBE AMP ([place]) NOT AT [facility]    EKG: None  Radiology: No results found.   Procedures   Medications Ordered in the ED  megestrol  (MEGACE ) tablet 80 mg (80 mg Oral Given 04/28/24 1333)  ferrous sulfate  tablet 325 mg (325 mg Oral Given 04/28/24 1337)    Clinical Course as of 04/28/24 1342  Mon Apr 28, 2024  1336 Patient had additional bleeding episode when using the bathroom. Patient wheeled back to exam room and reevaluated. Repeat vitals hemodynamically stable. Given megace  and iron .  [LR]    Clinical Course User Index [LR] Merwyn Hodapp, Friddie DASEN, PA-C                                 Medical Decision Making Amount and/or Complexity of Data Reviewed Labs: ordered.  Risk OTC drugs. Prescription drug management.   This patient is a 53 y.o. female  who presents to the ED for concern of abdominal pain/cramping and vaginal bleeding.   Differential diagnoses prior to evaluation: The emergent differential diagnosis includes, but is not  limited to,  Abnormal uterine bleeding, vaginal/cervical trauma, STD/PID, subchorionic hemorrhage/hematoma, threatened miscarriage, incomplete miscarriage, normal bleeding in early trimester pregnancy, ectopic pregnancy. This is not an exhaustive differential.   Past Medical History / Co-morbidities / Social History: HIV, uterine fibroids, anemia, HTN, schizophrenia  Additional history: Chart reviewed. Pertinent results include: Pt seen in ED on 6/4 for similar symptoms, most recently had CT and transvaginal US  performed on 6/24 with no acute findings  Physical Exam: Physical exam performed. The pertinent findings include: Normal vitals, no distress. Abdomen soft with no focal tenderness. GU exam performed with nurse chaperone with cervical bleeding noted, no other suspicious findings. No CMT.   Lab Tests/Imaging studies: I personally interpreted labs/imaging and the pertinent results include:  Hgb 10.1, down from 11.2 three days ago. MCV < 80. CMP normal. UA noninfectious. Wet prep normal. G/C collected and pending. Pregnancy negative.   As patient has a benign examination and history of similar symptoms and no acute change today, I considered abdominal imaging but felt it was not necessary to perform at this time.   Medications: I ordered medication including megace  80 mg and ferrous sulfate  325 mg.  I have reviewed the patients home medicines and have made adjustments as needed.   Disposition: After consideration of the diagnostic results and the patients response to treatment, I feel that emergency department workup does not suggest an emergent condition requiring admission or immediate intervention beyond what has been performed at this time. The plan is: discharge to home with megace , iron  supplementation, and OBGYN follow up. Patient was previously on megace  40 mg daily, will restart 40 mg BID for 4 days, then continue 40 mg daily. The patient is safe for discharge and has been instructed  to return immediately for worsening symptoms, change in symptoms or any other concerns.  I discussed this case with my attending physician Dr. Darra who cosigned this note including patient's presenting symptoms, physical exam, and planned diagnostics and interventions. Attending physician stated agreement with plan or made changes to plan which were implemented.   Final diagnoses:  Abnormal uterine bleeding    ED Discharge Orders          Ordered    megestrol  (MEGACE ) 40 MG tablet        04/28/24 1307    ferrous sulfate  325 (65 FE) MG tablet  Daily        04/28/24 1307           Portions of this report may have been transcribed using voice recognition software. Every effort was made to ensure accuracy; however, inadvertent computerized transcription errors may be present.   Rekisha Welling T, PA-C 04/28/24 1310  Addendum 1540 -- Patient had additional bleeding, documented in ED course. Patient remained hemodynamically stable. Given medications and deemed stable for discharge.    Xiana Carns T, PA-C 04/28/24 1342    Long, Joshua G, MD 05/01/24 [id number]

## 2024-04-29 ENCOUNTER — Other Ambulatory Visit: Payer: Self-pay | Admitting: Internal Medicine

## 2024-04-29 ENCOUNTER — Other Ambulatory Visit (HOSPITAL_COMMUNITY): Payer: Self-pay

## 2024-04-29 LAB — COMPLETE METABOLIC PANEL WITHOUT GFR
AG Ratio: 1.4 (calc) (ref 1.0–2.5)
ALT: 11 U/L (ref 6–29)
AST: 17 U/L (ref 10–35)
Albumin: 4.1 g/dL (ref 3.6–5.1)
Alkaline phosphatase (APISO): 49 U/L (ref 37–153)
BUN: 15 mg/dL (ref 7–25)
CO2: 28 mmol/L (ref 20–32)
Calcium: 9.4 mg/dL (ref 8.6–10.4)
Chloride: 101 mmol/L (ref 98–110)
Creat: 0.87 mg/dL (ref 0.50–1.03)
Globulin: 3 g/dL (ref 1.9–3.7)
Glucose, Bld: 85 mg/dL (ref 65–99)
Potassium: 3.7 mmol/L (ref 3.5–5.3)
Sodium: 136 mmol/L (ref 135–146)
Total Bilirubin: 0.4 mg/dL (ref 0.2–1.2)
Total Protein: 7.1 g/dL (ref 6.1–8.1)

## 2024-04-29 LAB — T-HELPER CELLS (CD4) COUNT (NOT AT ARMC)
Absolute CD4: 575 {cells}/uL (ref 490–1740)
CD4 T Helper %: 45 % (ref 30–61)
Total lymphocyte count: 1278 {cells}/uL (ref 850–3900)

## 2024-04-29 LAB — GC/CHLAMYDIA PROBE AMP (~~LOC~~) NOT AT ARMC
Chlamydia: NEGATIVE
Comment: NEGATIVE
Comment: NORMAL
Neisseria Gonorrhea: NEGATIVE

## 2024-04-29 LAB — CBC WITH DIFFERENTIAL/PLATELET
Absolute Lymphocytes: 1125 {cells}/uL (ref 850–3900)
Absolute Monocytes: 336 {cells}/uL (ref 200–950)
Basophils Absolute: 9 {cells}/uL (ref 0–200)
Basophils Relative: 0.3 %
Eosinophils Absolute: 30 {cells}/uL (ref 15–500)
Eosinophils Relative: 1 %
HCT: 36.6 % (ref 35.0–45.0)
Hemoglobin: 11.2 g/dL — ABNORMAL LOW (ref 11.7–15.5)
MCH: 25.1 pg — ABNORMAL LOW (ref 27.0–33.0)
MCHC: 30.6 g/dL — ABNORMAL LOW (ref 32.0–36.0)
MCV: 81.9 fL (ref 80.0–100.0)
MPV: 11.2 fL (ref 7.5–12.5)
Monocytes Relative: 11.2 %
Neutro Abs: 1500 {cells}/uL (ref 1500–7800)
Neutrophils Relative %: 50 %
Platelets: 263 Thousand/uL (ref 140–400)
RBC: 4.47 Million/uL (ref 3.80–5.10)
RDW: 16.1 % — ABNORMAL HIGH (ref 11.0–15.0)
Total Lymphocyte: 37.5 %
WBC: 3 Thousand/uL — ABNORMAL LOW (ref 3.8–10.8)

## 2024-04-29 LAB — RPR: RPR Ser Ql: NONREACTIVE

## 2024-04-29 LAB — LIPID PANEL
Cholesterol: 171 mg/dL (ref <200)
HDL: 58 mg/dL (ref >=50)
LDL Cholesterol (Calc): 99 mg/dL
Non-HDL Cholesterol (Calc): 113 mg/dL (ref <130)
Total CHOL/HDL Ratio: 2.9 (calc) (ref <5.0)
Triglycerides: 47 mg/dL (ref <150)

## 2024-04-29 LAB — HIV RNA, RTPCR W/R GT (RTI, PI,INT)
HIV 1 RNA Quant: NOT DETECTED {copies}/mL
HIV-1 RNA Quant, Log: NOT DETECTED {Log_copies}/mL

## 2024-04-29 MED ORDER — ROSUVASTATIN CALCIUM 10 MG PO TABS
10.0000 mg | ORAL_TABLET | Freq: Every day | ORAL | 0 refills | Status: DC
  Filled 2024-04-29: qty 30, 30d supply, fill #0

## 2024-05-05 ENCOUNTER — Emergency Department (HOSPITAL_COMMUNITY)

## 2024-05-05 ENCOUNTER — Other Ambulatory Visit: Payer: Self-pay

## 2024-05-05 ENCOUNTER — Emergency Department (HOSPITAL_COMMUNITY): Admission: EM | Admit: 2024-05-05 | Discharge: 2024-05-05 | Disposition: A | Discharge status: Home / Self Care

## 2024-05-05 ENCOUNTER — Encounter (HOSPITAL_COMMUNITY): Payer: Self-pay

## 2024-05-05 DIAGNOSIS — Z21 Asymptomatic human immunodeficiency virus [HIV] infection status: Secondary | ICD-10-CM | POA: Insufficient documentation

## 2024-05-05 DIAGNOSIS — M542 Cervicalgia: Secondary | ICD-10-CM | POA: Diagnosis present

## 2024-05-05 DIAGNOSIS — L723 Sebaceous cyst: Secondary | ICD-10-CM | POA: Insufficient documentation

## 2024-05-05 LAB — COMPREHENSIVE METABOLIC PANEL WITH GFR
ALT: 13 U/L (ref 0–44)
AST: 20 U/L (ref 15–41)
Albumin: 3.8 g/dL (ref 3.5–5.0)
Alkaline Phosphatase: 44 U/L (ref 38–126)
Anion gap: 12 (ref 5–15)
BUN: 10 mg/dL (ref 6–20)
CO2: 23 mmol/L (ref 22–32)
Calcium: 8.9 mg/dL (ref 8.9–10.3)
Chloride: 105 mmol/L (ref 98–111)
Creatinine, Ser: 1.05 mg/dL — ABNORMAL HIGH (ref 0.44–1.00)
GFR, Estimated: >60 mL/min (ref >60)
Glucose, Bld: 87 mg/dL (ref 70–99)
Potassium: 3.3 mmol/L — ABNORMAL LOW (ref 3.5–5.1)
Sodium: 140 mmol/L (ref 135–145)
Total Bilirubin: 0.7 mg/dL (ref 0.0–1.2)
Total Protein: 7.3 g/dL (ref 6.5–8.1)

## 2024-05-05 LAB — CBC
HCT: 31.8 % — ABNORMAL LOW (ref 36.0–46.0)
Hemoglobin: 10 g/dL — ABNORMAL LOW (ref 12.0–15.0)
MCH: 24.9 pg — ABNORMAL LOW (ref 26.0–34.0)
MCHC: 31.4 g/dL (ref 30.0–36.0)
MCV: 79.3 fL — ABNORMAL LOW (ref 80.0–100.0)
Platelets: 272 K/uL (ref 150–400)
RBC: 4.01 MIL/uL (ref 3.87–5.11)
RDW: 18.8 % — ABNORMAL HIGH (ref 11.5–15.5)
WBC: 2.8 K/uL — ABNORMAL LOW (ref 4.0–10.5)
nRBC: 0 % (ref 0.0–0.2)

## 2024-05-05 MED ORDER — CEPHALEXIN 500 MG PO CAPS: 500.0000 mg | ORAL_CAPSULE | Freq: Four times a day (QID) | ORAL | 0 refills | Status: AC

## 2024-05-05 MED ORDER — CEPHALEXIN 250 MG PO CAPS
500.0000 mg | ORAL_CAPSULE | Freq: Once | ORAL | Status: AC
  Administered 2024-05-05: 500 mg via ORAL
  Filled 2024-05-05: qty 2

## 2024-05-05 MED ORDER — IOHEXOL 350 MG/ML SOLN
50.0000 mL | Freq: Once | INTRAVENOUS | Status: AC | PRN
  Administered 2024-05-05: 50 mL via INTRAVENOUS

## 2024-05-05 MED ORDER — KETOROLAC TROMETHAMINE 15 MG/ML IJ SOLN
15.0000 mg | Freq: Once | INTRAMUSCULAR | Status: AC
Start: 2024-05-05 — End: 2024-05-05
  Administered 2024-05-05: 15 mg via INTRAVENOUS
  Filled 2024-05-05: qty 1

## 2024-05-05 NOTE — ED Notes (Signed)
 Pt taken to CT.

## 2024-05-05 NOTE — Discharge Instructions (Signed)
 Take your antibiotics as prescribed.  Alternate Tylenol  Motrin  as needed for pain.  Call your primary care doctor make a follow-up appointment.

## 2024-05-05 NOTE — ED Triage Notes (Signed)
 PT presents for 9/10 left sided neck pain with a lump to left neck, concerned for abscess.

## 2024-05-05 NOTE — ED Provider Notes (Signed)
 [place] EMERGENCY DEPARTMENT AT [facility] Provider Note   CSN: [id number] Arrival date & time: 05/05/24  1444     Patient presents with: Neck Pain and Dizziness   Dawn [name] is a 53 y.o. female.   53 year old female presents for evaluation of left-sided neck pain.  States she fell excellently bit her last night and has palpable left neck mass that she states is tender.  States it radiates to her ear and head.  Denies any other symptoms or concerns.   Neck Pain Associated symptoms: no chest pain and no fever   Dizziness Associated symptoms: no chest pain, no palpitations, no shortness of breath and no vomiting        Prior to Admission medications   Medication Sig Start Date End Date Taking? Authorizing Provider  cephALEXin  (KEFLEX ) 500 MG capsule Take 1 capsule (500 mg total) by mouth 4 (four) times daily. 05/05/24  Yes Corde Antonini L, DO  darunavir -cobicistat  (PREZCOBIX ) 800-150 MG tablet TAKE ONE TABLET BY MOUTH DAILY WITH BREAKFAST *SWALLOW WHOLE. DO NOT CRUSH, BREAK OR CHEW TABLETS* 03/06/24   Luiz Channel, MD  emtricitabine -tenofovir  AF (DESCOVY ) 200-25 MG tablet Take 1 tablet by mouth daily. 03/06/24   Luiz Channel, MD  ferrous sulfate  325 (65 FE) MG tablet Take 1 tablet (325 mg total) by mouth daily. 04/28/24 05/28/24  Roemhildt, Lorin T, PA-C  FLUoxetine  (PROZAC ) 20 MG capsule Take 1 capsule (20 mg total) by mouth daily. 03/15/23   Luiz Channel, MD  hydrochlorothiazide  (HYDRODIURIL ) 25 MG tablet Take 0.5 tablets (12.5 mg total) by mouth daily. 05/14/23   Luiz Channel, MD  hyoscyamine  (LEVSIN AMIEL) 0.125 MG SL tablet Place 1 tablet (0.125 mg total) under the tongue 4 (four) times daily as needed for up to 5 days. 02/19/24 02/27/24  Trine Raynell Moder, MD  loperamide  (IMODIUM ) 2 MG capsule Take 1 capsule (2 mg total) by mouth 4 (four) times daily as needed for diarrhea or loose stools. 10/19/23   Horton, Charmaine FALCON, MD  megestrol  (MEGACE ) 40 MG tablet  Take 2 tablets once daily for 4 days, then 1 tablet daily. 04/28/24   Roemhildt, Lorin T, PA-C  rosuvastatin  (CRESTOR ) 10 MG tablet Take 1 tablet (10 mg total) by mouth daily. 04/29/24   Luiz Channel, MD  valACYclovir  (VALTREX ) 1000 MG tablet Take 1 tablet (1,000 mg total) by mouth daily. 01/23/24   Luiz Channel, MD  diphenhydrAMINE  (BENADRYL ) 25 MG tablet Take 1 tablet (25 mg total) by mouth every 6 (six) hours as needed for up to 3 days. 06/17/20 09/04/20  Woods, Jaclyn M, PA-C  famotidine  (PEPCID ) 20 MG tablet Take 1 tablet (20 mg total) by mouth 2 (two) times daily for 5 days. 06/17/20 09/04/20  Woods, Jaclyn M, PA-C    Allergies: Chocolate and Tomato    Review of Systems  Constitutional:  Negative for chills and fever.  HENT:  Negative for ear pain and sore throat.   Eyes:  Negative for pain and visual disturbance.  Respiratory:  Negative for cough and shortness of breath.   Cardiovascular:  Negative for chest pain and palpitations.  Gastrointestinal:  Negative for abdominal pain and vomiting.  Genitourinary:  Negative for dysuria and hematuria.  Musculoskeletal:  Positive for neck pain. Negative for arthralgias and back pain.  Skin:  Negative for color change and rash.  Neurological:  Positive for dizziness. Negative for seizures and syncope.  All other systems reviewed and are negative.   Updated Vital Signs BP 136/80 (BP Location:  Left Arm)   Pulse 89   Temp 98.7 F (37.1 C) (Oral)   Resp (!) 22   Ht 5' 4 (1.626 m)   Wt 68.5 kg   LMP  (LMP Unknown)   SpO2 100%   BMI 25.92 kg/m   Physical Exam Vitals and nursing note reviewed.  Constitutional:      General: She is not in acute distress.    Appearance: Normal appearance. She is well-developed. She is not ill-appearing.  HENT:     Head: Normocephalic and atraumatic.  Eyes:     Conjunctiva/sclera: Conjunctivae normal.  Neck:     Comments: There is a palpable mass just below the lymph nodes, that is hard without  evidence of erythema, warmth, or superficial abscess Cardiovascular:     Rate and Rhythm: Normal rate and regular rhythm.     Heart sounds: No murmur heard. Pulmonary:     Effort: Pulmonary effort is normal. No respiratory distress.     Breath sounds: Normal breath sounds.  Abdominal:     Palpations: Abdomen is soft.     Tenderness: There is no abdominal tenderness.  Musculoskeletal:        General: No swelling.     Cervical back: Neck supple.  Skin:    General: Skin is warm and dry.     Capillary Refill: Capillary refill takes less than 2 seconds.  Neurological:     Mental Status: She is alert.  Psychiatric:        Mood and Affect: Mood normal.     (all labs ordered are listed, but only abnormal results are displayed) Labs Reviewed  COMPREHENSIVE METABOLIC PANEL WITH GFR - Abnormal; Notable for the following components:      Result Value   Potassium 3.3 (*)    Creatinine, Ser 1.05 (*)    All other components within normal limits  CBC - Abnormal; Notable for the following components:   WBC 2.8 (*)    Hemoglobin 10.0 (*)    HCT 31.8 (*)    MCV 79.3 (*)    MCH 24.9 (*)    RDW 18.8 (*)    All other components within normal limits    EKG: EKG Interpretation Date/Time:  Monday May 05 2024 15:22:19 EDT Ventricular Rate:  93 PR Interval:  194 QRS Duration:  82 QT Interval:  356 QTC Calculation: 442 R Axis:   78  Text Interpretation: Normal sinus rhythm Possible Anterior infarct , age undetermined  Compared with prior EKG from 02/18/2024 Confirmed by Gennaro Bouchard ([id number]) on 05/05/2024 5:20:38 PM  Radiology: CT Soft Tissue Neck W Contrast Result Date: 05/05/2024 CLINICAL DATA:  Initial evaluation for acute left neck pain. EXAM: CT NECK WITH CONTRAST TECHNIQUE: Multidetector CT imaging of the neck was performed using the standard protocol following the bolus administration of intravenous contrast. RADIATION DOSE REDUCTION: This exam was performed according to the  departmental dose-optimization program which includes automated exposure control, adjustment of the mA and/or kV according to patient size and/or use of iterative reconstruction technique. CONTRAST:  50mL OMNIPAQUE  IOHEXOL  350 MG/ML SOLN COMPARISON:  Prior ultrasound from 08/08/2022 FINDINGS: Pharynx and larynx: Oral cavity within normal limits. Torus palatini noted. Oropharynx and nasopharynx within normal limits. No retropharyngeal collection or swelling. Negative epiglottis. Hypopharynx, supraglottic larynx, glottis within normal limits. Subglottic airway clear. Salivary glands: Salivary glands including the parotid and submandibular glands are within normal limits. 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 Lymph nodes: No enlarged or pathologic  lymph nodes within the neck. Vascular: Normal intravascular enhancement seen within the neck. Limited intracranial: Unremarkable. Visualized orbits: Unremarkable. Mastoids and visualized paranasal sinuses: Visualized paranasal sinuses are clear. Visualized mastoids and middle ear cavities are well pneumatized and free of fluid. Skeleton: No discrete or worrisome osseous lesions. Upper chest: No other acute finding. Partially visualized lungs are clear. Other: 1.7 cm thick rimmed hypodense lesions/collections seen involving the subcutaneous fat of the left posterior neck (series 3, image 43). Finding likely reflects a small sebaceous cyst. Mild surrounding fat stranding, consistent with a degree of associated inflammation. Superimposed infection not excluded. IMPRESSION: 1. 1.7 cm thick rimmed hypodense lesion/collection involving the subcutaneous fat of the left posterior neck, likely reflecting a small sebaceous cyst. Mild surrounding fat stranding, consistent with a degree of associated inflammation. Superimposed infection not excluded. 2. No other acute abnormality within the neck. 3. 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 This has been previously evaluated by thyroid  ultrasound, most recently on 08/08/2022. Please refer to this exam regarding a potential follow-up recommendations regarding this finding. Electronically Signed   By: Morene Hoard M.D.   On: 05/05/2024 21:06     Procedures   Medications Ordered in the ED  ketorolac  (TORADOL ) 15 MG/ML injection 15 mg (15 mg Intravenous Given 05/05/24 1803)  iohexol  (OMNIPAQUE ) 350 MG/ML injection 50 mL (50 mLs Intravenous Contrast Given 05/05/24 2039)  cephALEXin  (KEFLEX ) capsule 500 mg (500 mg Oral Given 05/05/24 2149)                                    Medical Decision Making Social determinants of health: History of AIDS, history of mood disorder  Patient here for neck pain and found to have sebaceous cyst on CT scan of chest that has some swelling around it may be infected.  There is no drainable abscess.  Patient is given Toradol  here and feeling much better.  I will start her on Keflex .  Advised close follow-up with her primary care doctor and otherwise return to the ER for new or worsening symptoms.  She feels comfortable with the plan to be discharged home.  Problems Addressed: Sebaceous cyst: undiagnosed new problem with uncertain prognosis  Amount and/or Complexity of Data Reviewed External Data Reviewed: notes.    Details: Prior ED records reviewed and patient recently seen 1 week ago for abnormal uterine bleeding Labs: ordered. Decision-making details documented in ED Course.    Details: Ordered and reviewed by me and unremarkable Radiology: ordered and independent interpretation performed. Decision-making details documented in ED Course.    Details: Read and reviewed by me and CT soft tissue of the neck shows evidence of cyst with swelling around the area possible infection and known goiter  Risk OTC drugs. Prescription drug management. Diagnosis or treatment significantly limited by social  determinants of health.     Final diagnoses:  Sebaceous cyst    ED Discharge Orders          Ordered    cephALEXin  (KEFLEX ) 500 MG capsule  4 times daily        05/05/24 2145               Gennaro Bouchard L, DO 05/05/24 2256

## 2024-05-05 NOTE — ED Triage Notes (Signed)
 Pt noticed a lump on the left side of her neck 2 days ago and started to have pain in her neck with associated dizziness. Feels like she could pass out yesterday

## 2024-05-06 ENCOUNTER — Ambulatory Visit (INDEPENDENT_AMBULATORY_CARE_PROVIDER_SITE_OTHER): Payer: MEDICAID | Admitting: Gastroenterology

## 2024-05-06 ENCOUNTER — Other Ambulatory Visit (HOSPITAL_COMMUNITY): Payer: Self-pay

## 2024-05-06 ENCOUNTER — Other Ambulatory Visit: Payer: Self-pay

## 2024-05-06 ENCOUNTER — Encounter: Payer: Self-pay | Admitting: Gastroenterology

## 2024-05-06 ENCOUNTER — Other Ambulatory Visit (INDEPENDENT_AMBULATORY_CARE_PROVIDER_SITE_OTHER)

## 2024-05-06 VITALS — BP 144/80 | HR 100 | Ht 65.0 in | Wt 155.2 lb

## 2024-05-06 DIAGNOSIS — R110 Nausea: Secondary | ICD-10-CM | POA: Diagnosis not present

## 2024-05-06 DIAGNOSIS — D649 Anemia, unspecified: Secondary | ICD-10-CM

## 2024-05-06 DIAGNOSIS — R12 Heartburn: Secondary | ICD-10-CM

## 2024-05-06 DIAGNOSIS — N920 Excessive and frequent menstruation with regular cycle: Secondary | ICD-10-CM | POA: Diagnosis not present

## 2024-05-06 DIAGNOSIS — R194 Change in bowel habit: Secondary | ICD-10-CM

## 2024-05-06 DIAGNOSIS — B20 Human immunodeficiency virus [HIV] disease: Secondary | ICD-10-CM

## 2024-05-06 LAB — C-REACTIVE PROTEIN: CRP: <1 mg/dL (ref 0.5–20.0)

## 2024-05-06 LAB — TSH: TSH: 0.89 u[IU]/mL (ref 0.35–5.50)

## 2024-05-06 MED ORDER — NA SULFATE-K SULFATE-MG SULF 17.5-3.13-1.6 GM/177ML PO SOLN
1.0000 | Freq: Once | ORAL | 0 refills | Status: AC
  Filled 2024-05-06: qty 354, 1d supply, fill #0

## 2024-05-06 MED ORDER — NA SULFATE-K SULFATE-MG SULF 17.5-3.13-1.6 GM/177ML PO SOLN
1.0000 | Freq: Once | ORAL | 0 refills | Status: DC
Start: 2024-05-06 — End: 2024-05-06
  Filled 2024-05-06: qty 354, 2d supply, fill #0
  Filled 2024-05-06: qty 354, 1d supply, fill #0

## 2024-05-06 NOTE — Patient Instructions (Signed)
 Your provider has requested that you go to the basement level for lab work before leaving today. Press B on the elevator. The lab is located at the first door on the left as you exit the elevator.  You have been scheduled for an endoscopy and colonoscopy. Please follow the written instructions given to you at your visit today.  If you use inhalers (even only as needed), please bring them with you on the day of your procedure.  DO NOT TAKE 7 DAYS PRIOR TO TEST- Trulicity (dulaglutide) Ozempic, Wegovy (semaglutide) Mounjaro (tirzepatide) Bydureon Bcise (exanatide extended release)  DO NOT TAKE 1 DAY PRIOR TO YOUR TEST Rybelsus (semaglutide) Adlyxin (lixisenatide) Victoza (liraglutide) Byetta (exanatide)  ___________________________________________________________________________  Due to recent changes in healthcare laws, you may see the results of your imaging and laboratory studies on MyChart before your provider has had a chance to review them.  We understand that in some cases there may be results that are confusing or concerning to you. Not all laboratory results come back in the same time frame and the provider may be waiting for multiple results in order to interpret others.  Please give us  48 hours in order for your provider to thoroughly review all the results before contacting the office for clarification of your results.   _______________________________________________________  If your blood pressure at your visit was 140/90 or greater, please contact your primary care physician to follow up on this.  _______________________________________________________  If you are age 16 or older, your body mass index should be between 23-30. Your Body mass index is 25.83 kg/m. If this is out of the aforementioned range listed, please consider follow up with your Primary Care Provider.  If you are age 4 or younger, your body mass index should be between 19-25. Your Body mass index is  25.83 kg/m. If this is out of the aformentioned range listed, please consider follow up with your Primary Care Provider.   ________________________________________________________  The [facility] providers would like to encourage you to use MYCHART to communicate with providers for non-urgent requests or questions.  Due to long hold times on the telephone, sending your provider a message by [facility] may be a faster and more efficient way to get a response.  Please allow 48 business hours for a response.  Please remember that this is for non-urgent requests.  _______________________________________________________  [facility] is using a team-based approach to care.  Your team is made up of your doctor and two to three APPS. Our APPS (Nurse Practitioners and Physician Assistants) work with your physician to ensure care continuity for you. They are fully qualified to address your health concerns and develop a treatment plan. They communicate directly with your gastroenterologist to care for you. Seeing the Advanced Practice Practitioners on your physician's team can help you by facilitating care more promptly, often allowing for earlier appointments, access to diagnostic testing, procedures, and other specialty referrals.   Thank you for trusting me with your gastrointestinal care. Deanna May, FNP-C

## 2024-05-06 NOTE — Progress Notes (Signed)
 Chief Complaint: Anemia, unspecified type  Primary GI Doctor: Dr. Leigh  HPI:  Patient is a  53  year old A.A female patient with past medical history of AUB, anemia, uterine fibroids, and HIV (ondescovy/DRVc ), who was referred to me by Donah Laymon PARAS, MD  on 02/15/24 for a evaluation of anemia, unspecified type  .    04/28/2024 patient seen in ED for abdominal pain and vaginal bleeding. Pt seen in ED on 6/4 for similar symptoms, most recently had CT and transvaginal US  performed on 6/24 with no acute findings.  History of uterine fibroids. Hgb 10.1, down from 11.2 three days ago. MCV < 80. CMP normal. UA noninfectious. Wet prep normal. G/C collected and pending. Pregnancy negative. discharge to home with megace , iron  supplementation, and OBGYN follow up.  04/25/2024 follow-up with infectious disease for HIV.Currently on on descovy /DRVc, where she was undetectable in April.  Interval History    Patient presents for evaluation of acute on chronic anemia. She notes she has heavy menstrual cycles for 7 days or more resulting in multiple ED visits. She will also have abdominal cramping with her cycles. She is taking ferrous sulfate  1 tablet po daily and megestrol  1 tab po daily. She notes this has helped with the bleeding. She has not made an appointment with the OB/GYN yet.   Patient has had intermittent nausea, no vomiting. No hematemesis. No weight loss. Appetite good.   Patient reports occasional reflux and she uses OTC antiacids. Denies dysphagia.   Patient reports she has had issues with diarrhea since starting the current HIV medications. She reports she on average has 5 bowel movements daily. She notes it is worse with eating fast food. She does eat a lot of gumbo and spicy food. No blood in stool .   She has never had colonoscopy or EGD.   No alcohol use. Nonsmoker.   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 20 capsule 0   darunavir -cobicistat  (PREZCOBIX ) 800-150 MG tablet TAKE ONE TABLET BY MOUTH DAILY WITH BREAKFAST *SWALLOW WHOLE. DO NOT CRUSH, BREAK OR CHEW TABLETS* 30 tablet 0   emtricitabine -tenofovir  AF (DESCOVY ) 200-25 MG tablet Take 1 tablet by mouth daily. 30 tablet 0   ferrous sulfate  325 (65 FE) MG tablet Take 1 tablet (325 mg total) by mouth daily. 30 tablet 0   FLUoxetine  (PROZAC ) 20 MG capsule Take 1 capsule (  20 mg total) by mouth daily. 30 capsule 3   hydrochlorothiazide  (HYDRODIURIL ) 25 MG tablet Take 0.5 tablets (12.5 mg total) by mouth daily. 30 tablet 6   hyoscyamine  (LEVSIN /SL) 0.125 MG SL tablet Place 1 tablet (0.125 mg total) under the tongue 4 (four) times daily as needed for up to 5 days. 30 tablet 0   loperamide  (IMODIUM ) 2 MG capsule Take 1 capsule (2 mg total) by mouth 4 (four) times daily as needed for diarrhea or loose stools. 12 capsule 0   megestrol  (MEGACE ) 40 MG tablet Take 2 tablets once daily for 4 days, then 1 tablet daily. 34 tablet 0   rosuvastatin  (CRESTOR ) 10 MG tablet Take 1 tablet (10 mg total) by mouth daily. 30 tablet 0   valACYclovir  (VALTREX ) 1000 MG tablet Take 1 tablet  (1,000 mg total) by mouth daily. (Patient taking differently: Take 1,000 mg by mouth as needed.) 30 tablet 1   Na Sulfate-K Sulfate-Mg Sulfate concentrate (SUPREP) 17.5-3.13-1.6 GM/177ML SOLN Take 1 kit (354 mLs total) by mouth once for 1 dose. 354 mL 0   No current facility-administered medications for this visit.    Allergies as of 05/06/2024 - Review Complete 05/06/2024  Allergen Reaction Noted   Chocolate Hives 09/01/2013   Tomato Hives 08/30/2013    Family History  Problem Relation Age of Onset   Breast cancer Neg Hx     Review of Systems:    Constitutional: No weight loss, fever, chills, weakness or fatigue HEENT: Eyes: No change in vision               Ears, Nose, Throat:  No change in hearing or congestion Skin: No rash or itching Cardiovascular: No chest pain, chest pressure or palpitations   Respiratory: No SOB or cough Gastrointestinal: See HPI and otherwise negative Genitourinary: No dysuria or change in urinary frequency Neurological: No headache, dizziness or syncope Musculoskeletal: No new muscle or joint pain Hematologic: No bleeding or bruising Psychiatric: No history of depression or anxiety    Physical Exam:  Vital signs: BP (!) 144/80 (BP Location: Left Arm, Patient Position: Sitting, Cuff Size: Normal)   Pulse 100   Ht 5' 5 (1.651 m) Comment: height measured without shoes  Wt 155 lb 4 oz (70.4 kg)   LMP 05/01/2024   BMI 25.83 kg/m   Constitutional: Pleasant A.A. female appears to be in NAD, Well developed, Well nourished, alert and cooperative Throat: Oral cavity and pharynx without inflammation, swelling or lesion.  Respiratory: Respirations even and unlabored. Lungs clear to auscultation bilaterally.   No wheezes, crackles, or rhonchi.  Cardiovascular: Normal S1, S2. Regular rate and rhythm. No peripheral edema, cyanosis or pallor.  Gastrointestinal:  Soft, nondistended, nontender. No rebound or guarding. Normal bowel sounds. No appreciable masses  or hepatomegaly. Rectal:  Not performed.  Msk:  Symmetrical without gross deformities. Without edema, no deformity or joint abnormality.  Neurologic:  Alert and  oriented x4;  grossly normal neurologically.  Skin:   Dry and intact without significant lesions or rashes.  RELEVANT LABS AND IMAGING: CBC    Latest Ref Rng & Units 05/05/2024    3:23 PM 04/28/2024   11:23 AM 04/25/2024   10:13 AM  CBC  WBC 4.0 - 10.5 K/uL 2.8  3.4  3.0   Hemoglobin 12.0 - 15.0 g/dL 89.9  89.8  88.7   Hematocrit 36.0 - 46.0 % 31.8  33.2  36.6   Platelets 150 - 400 K/uL 272  231  263  CMP     Latest Ref Rng & Units 05/05/2024    3:23 PM 04/28/2024   11:23 AM 04/25/2024   10:13 AM  CMP  Glucose 70 - 99 mg/dL 87  85  85   BUN 6 - 20 mg/dL 10  9  15    Creatinine 0.44 - 1.00 mg/dL 8.94  9.06  9.12   Sodium 135 - 145 mmol/L 140  138  136   Potassium 3.5 - 5.1 mmol/L 3.3  3.6  3.7   Chloride 98 - 111 mmol/L 105  105  101   CO2 22 - 32 mmol/L 23  23  28    Calcium  8.9 - 10.3 mg/dL 8.9  9.1  9.4   Total Protein 6.5 - 8.1 g/dL 7.3  7.2  7.1   Total Bilirubin 0.0 - 1.2 mg/dL 0.7  0.4  0.4   Alkaline Phos 38 - 126 U/L 44  55    AST 15 - 41 U/L 20  19  17    ALT 0 - 44 U/L 13  12  11       Lab Results  Component Value Date   TSH 1.331 03/26/2022    Lab Results  Component Value Date   IRON  331 (HH) 02/15/2024   TIBC 392 02/15/2024   FERRITIN 26 02/15/2024     Assessment: Encounter Diagnoses  Name Primary?   Acute on chronic anemia Yes   Menorrhagia with regular cycle    Nausea without vomiting    Heartburn    Altered bowel habits    HIV disease (HCC)        53 year old female patient with history of HIV, uterine fibroids who presents for evaluation of acute on chronic anemia.  Hemoglobin currently stable at 10.  Patient is currently taking oral iron  supplementation.  Patient does report heavy menstrual cycles and pending OB/GYN evaluation.  Patient has never had upper GI endoscopy or colonoscopy  however presents with complaints of nausea and altered bowel habits.  Will go ahead and check lab work to rule out inflammatory disease, celiac disease and/or thyroid  disease.  Will also collect stool test to rule out enteric infection.  Will also go ahead and proceed with upper GI endoscopy and colonoscopy with Dr. Leigh in [facility] to evaluate for source of bleeding.   Plan: - Check CRP, TTG IgA, IgA, TSH  - Check GI profile with cdiff PCR  -continue oral iron  supplement  -Schedule appointment with OB/GYN  -Schedule In [facility] with Dr. Leigh The risks and benefits of EGD with possible biopsies and esophageal dilation were discussed with the patient who agrees to proceed.  -Schedule for a colonoscopy. The risks and benefits of colonoscopy with possible polypectomy / biopsies were discussed and the patient agrees to proceed.    Thank you for the courtesy of this consult. Please call me with any questions or concerns.   Traves Majchrzak, FNP-C [facility] 05/06/2024, 4:41 PM  Cc: Nicholas Bar, MD

## 2024-05-07 ENCOUNTER — Other Ambulatory Visit: Payer: Self-pay

## 2024-05-07 LAB — TISSUE TRANSGLUTAMINASE ABS,IGG,IGA
(tTG) Ab, IgA: <1 U/mL
(tTG) Ab, IgG: <1 U/mL

## 2024-05-07 LAB — IGA: Immunoglobulin A: 306 mg/dL (ref 47–310)

## 2024-05-07 NOTE — Progress Notes (Signed)
 Agree with assessment and plan as outlined. She has a chronic microcytic anemia, history of IDA. Given her age she does warrant a colonoscopy regardless, and would add EGD to clear her upper tract given symptoms and anemia. If these studies are negative would assume her anemia is caused by mentrual losses. Agree with GYN evaluation.

## 2024-05-08 ENCOUNTER — Ambulatory Visit: Payer: Self-pay | Admitting: Gastroenterology

## 2024-05-13 ENCOUNTER — Ambulatory Visit (INDEPENDENT_AMBULATORY_CARE_PROVIDER_SITE_OTHER)

## 2024-05-13 ENCOUNTER — Other Ambulatory Visit (HOSPITAL_COMMUNITY)
Admission: RE | Admit: 2024-05-13 | Discharge: 2024-05-13 | Disposition: A | Discharge status: Home / Self Care | Source: Ambulatory Visit

## 2024-05-13 VITALS — BP 147/93 | HR 88 | Wt 154.0 lb

## 2024-05-13 DIAGNOSIS — I10 Essential (primary) hypertension: Secondary | ICD-10-CM

## 2024-05-13 DIAGNOSIS — N939 Abnormal uterine and vaginal bleeding, unspecified: Secondary | ICD-10-CM

## 2024-05-13 DIAGNOSIS — D251 Intramural leiomyoma of uterus: Secondary | ICD-10-CM | POA: Diagnosis not present

## 2024-05-13 NOTE — Progress Notes (Signed)
   GYNECOLOGY OFFICE VISIT NOTE  History:   Dawn [name] is a 53 y.o. [id number] here today for VB.  - ED visit 04/28/2024 with cc: VB with clots. No imaging done, Hgb10. Rx Megace  and iron  supplementation. She had ED visit with same cc in early June, imaging identified fibroids, and Hgb was 7.9. Of note, these fibroids have been seen for years, now are calcified. - Today, pt uses chart in the room to indicate VB at the time of ED visit was heavy and reports ongoing >1wk. Since starting Megace , bleeding has reduced to spotting only.  - periods are qmonthly, lasting 5-7d, flow is moderate, sometimes golf ball size clots.   ROS (+) dizzy, SOB, CP, palpitations, all when she is on her period (-)   Physical Exam:  BP (!) 147/93   Pulse 88   Wt 154 lb (69.9 kg)   LMP 05/01/2024   BMI 25.63 kg/m  CONSTITUTIONAL: Well-developed, well-nourished female in no acute distress.  CV: regular rate RESPIRATORY: Effort normal PELVIC: Normal appearing external genitalia; normal appearing vaginal mucosa and cervix.  No abnormal discharge noted.  Normal uterine size, no other palpable masses, no uterine or adnexal tenderness.  Labs and Imaging CT ABDOMEN PELVIS W CONTRAST  02/19/2024 FINDINGS: Reproductive: Multiple calcified uterine fibroids are noted. No adnexal mass is seen. IMPRESSION: Uterine fibroid change  Electronically Signed   By: Oneil Devonshire M.D.   On: 02/19/2024 01:02  US  PELVIC COMPLETE WITH TRANSVAGINAL 02/19/2024 FINDINGS: Uterusanteverted, 9 x 6 x 5 cm. The endometrium 1.3 cm. The uterine cavity is empty. The following uterine fibroids were identified. 1. Posterior intramural, 4.1 x 3.0 x 2.9 cm. 2. Posterior intramural calcified, 1.5 x 1.5 x 0.8 cm. Electronically Signed   By: Fonda Field M.D.   On: 02/22/2024 14:32   PROCEDURE: EMB Patient given informed consent, signed copy in the chart, time out was performed.    The patient was placed in the lithotomy position and  the cervix brought into view with sterile speculum.  Cervix cleansed x 2 with betadine swabs. A tenaculum was placed in the anterior lip of the cervix. The uterus was sounded for depth of 6cm. A pipelle was introduced to into the uterus, suction created,  and an endometrial sample was obtained. Repeated x2 for total of 3 samples, placed in the same formalin cup. All equipment was removed and accounted for.  The patient tolerated the procedure well.   Patient given post procedure instructions. Per patient preference she will be notified of results at 2wk f/u.   Assessment and Plan:   Assessment & Plan Abnormal uterine bleeding (AUB) [N93.9] Ddx: endometrial hyperplasia, fibroids, polyp, heavier menses. Unlikely to be sAb after tubal ligation. Patient is menopausal with regular periods.  Fibroids, intramural Known fibroid, now calcified compared to several years ago. Possibly the source of AUB. Primary hypertension Did not take her antiHTN this morning, plans to take them when she returns home.   Return in about 2 weeks (around 05/27/2024) for Results.    Barabara Maier, DO FMOB Fellow, Faculty Practice [facility], [facility] [employer]

## 2024-05-13 NOTE — Assessment & Plan Note (Signed)
 Did not take her antiHTN this morning, plans to take them when she returns home.

## 2024-05-16 ENCOUNTER — Other Ambulatory Visit (HOSPITAL_COMMUNITY): Payer: Self-pay

## 2024-05-19 LAB — SURGICAL PATHOLOGY

## 2024-05-20 ENCOUNTER — Ambulatory Visit: Payer: Self-pay

## 2024-05-20 ENCOUNTER — Other Ambulatory Visit (HOSPITAL_COMMUNITY): Payer: Self-pay

## 2024-05-20 ENCOUNTER — Other Ambulatory Visit: Payer: Self-pay

## 2024-05-20 ENCOUNTER — Other Ambulatory Visit: Payer: Self-pay | Admitting: Internal Medicine

## 2024-05-20 DIAGNOSIS — B20 Human immunodeficiency virus [HIV] disease: Secondary | ICD-10-CM

## 2024-05-20 MED ORDER — DESCOVY 200-25 MG PO TABS
1.0000 | ORAL_TABLET | Freq: Every day | ORAL | 5 refills | Status: DC
  Filled 2024-05-20 – 2024-06-11 (×3): qty 30, 30d supply, fill #0
  Filled 2024-07-03 – 2024-07-14 (×3): qty 30, 30d supply, fill #1
  Filled 2024-08-06 (×2): qty 30, 30d supply, fill #2
  Filled 2024-09-17: qty 30, 30d supply, fill #3

## 2024-05-20 MED ORDER — PREZCOBIX 800-150 MG PO TABS
ORAL_TABLET | ORAL | 5 refills | Status: DC
  Filled 2024-05-20: qty 30, fill #0
  Filled 2024-05-22 – 2024-06-11 (×2): qty 30, 30d supply, fill #0
  Filled 2024-07-03 – 2024-07-14 (×3): qty 30, 30d supply, fill #1
  Filled 2024-08-06 (×2): qty 30, 30d supply, fill #2
  Filled 2024-09-17: qty 30, 30d supply, fill #3

## 2024-05-20 MED ORDER — DOXYCYCLINE HYCLATE 100 MG PO CAPS
100.0000 mg | ORAL_CAPSULE | Freq: Two times a day (BID) | ORAL | 0 refills | Status: AC
  Filled 2024-05-20: qty 28, 14d supply, fill #0

## 2024-05-22 ENCOUNTER — Other Ambulatory Visit: Payer: Self-pay

## 2024-05-23 ENCOUNTER — Encounter (HOSPITAL_COMMUNITY): Payer: Self-pay | Admitting: Emergency Medicine

## 2024-05-23 ENCOUNTER — Other Ambulatory Visit: Payer: Self-pay

## 2024-05-23 ENCOUNTER — Emergency Department (HOSPITAL_COMMUNITY)
Admission: EM | Admit: 2024-05-23 | Discharge: 2024-05-23 | Disposition: A | Discharge status: Home / Self Care | Attending: Emergency Medicine | Admitting: Emergency Medicine

## 2024-05-23 DIAGNOSIS — Z87891 Personal history of nicotine dependence: Secondary | ICD-10-CM | POA: Insufficient documentation

## 2024-05-23 DIAGNOSIS — R197 Diarrhea, unspecified: Secondary | ICD-10-CM | POA: Insufficient documentation

## 2024-05-23 DIAGNOSIS — Z21 Asymptomatic human immunodeficiency virus [HIV] infection status: Secondary | ICD-10-CM | POA: Diagnosis not present

## 2024-05-23 DIAGNOSIS — L299 Pruritus, unspecified: Secondary | ICD-10-CM | POA: Diagnosis not present

## 2024-05-23 LAB — CBC WITH DIFFERENTIAL/PLATELET
Abs Immature Granulocytes: 0.02 K/uL (ref 0.00–0.07)
Basophils Absolute: 0 K/uL (ref 0.0–0.1)
Basophils Relative: 0 %
Eosinophils Absolute: 0 K/uL (ref 0.0–0.5)
Eosinophils Relative: 0 %
HCT: 32.7 % — ABNORMAL LOW (ref 36.0–46.0)
Hemoglobin: 10.5 g/dL — ABNORMAL LOW (ref 12.0–15.0)
Immature Granulocytes: 1 %
Lymphocytes Relative: 37 %
Lymphs Abs: 1.1 K/uL (ref 0.7–4.0)
MCH: 25.2 pg — ABNORMAL LOW (ref 26.0–34.0)
MCHC: 32.1 g/dL (ref 30.0–36.0)
MCV: 78.4 fL — ABNORMAL LOW (ref 80.0–100.0)
Monocytes Absolute: 0.3 K/uL (ref 0.1–1.0)
Monocytes Relative: 11 %
Neutro Abs: 1.5 K/uL — ABNORMAL LOW (ref 1.7–7.7)
Neutrophils Relative %: 51 %
Platelets: 231 K/uL (ref 150–400)
RBC: 4.17 MIL/uL (ref 3.87–5.11)
RDW: 17.7 % — ABNORMAL HIGH (ref 11.5–15.5)
WBC: 2.9 K/uL — ABNORMAL LOW (ref 4.0–10.5)
nRBC: 0 % (ref 0.0–0.2)

## 2024-05-23 LAB — URINALYSIS, ROUTINE W REFLEX MICROSCOPIC
Bilirubin Urine: NEGATIVE
Glucose, UA: NEGATIVE mg/dL
Ketones, ur: NEGATIVE mg/dL
Leukocytes,Ua: NEGATIVE
Nitrite: NEGATIVE
Protein, ur: >=300 mg/dL — AB
RBC / HPF: >50 RBC/hpf (ref 0–5)
Specific Gravity, Urine: 1.028 (ref 1.005–1.030)
pH: 5 (ref 5.0–8.0)

## 2024-05-23 LAB — COMPREHENSIVE METABOLIC PANEL WITH GFR
ALT: 7 U/L (ref 0–44)
AST: 20 U/L (ref 15–41)
Albumin: 4.1 g/dL (ref 3.5–5.0)
Alkaline Phosphatase: 52 U/L (ref 38–126)
Anion gap: 13 (ref 5–15)
BUN: 12 mg/dL (ref 6–20)
CO2: 22 mmol/L (ref 22–32)
Calcium: 9.1 mg/dL (ref 8.9–10.3)
Chloride: 101 mmol/L (ref 98–111)
Creatinine, Ser: 1.09 mg/dL — ABNORMAL HIGH (ref 0.44–1.00)
GFR, Estimated: >60 mL/min (ref >60)
Glucose, Bld: 93 mg/dL (ref 70–99)
Potassium: 3.1 mmol/L — ABNORMAL LOW (ref 3.5–5.1)
Sodium: 136 mmol/L (ref 135–145)
Total Bilirubin: 0.5 mg/dL (ref 0.0–1.2)
Total Protein: 7.6 g/dL (ref 6.5–8.1)

## 2024-05-23 LAB — LIPASE, BLOOD: Lipase: 87 U/L — ABNORMAL HIGH (ref 11–51)

## 2024-05-23 NOTE — Discharge Instructions (Addendum)
 Your workup this morning was reassuring.  You may take Imodium  which is an over-the-counter antidiarrheal agent.  You may also take an antihistamine such as cetirizine  for your itchy skin.  Please evaluate your home for recent environmental changes such as new laundry detergent as this could cause itchy skin.  Follow-up with your primary care team as needed for further evaluation.

## 2024-05-23 NOTE — ED Triage Notes (Signed)
 53 y/o female comes in c/o diarrhea for the past two weeks and redness and hives. Pt reports  something is biting me, I don't know if it's dust mites, scabies or maybe something in the carpet.

## 2024-05-23 NOTE — ED Provider Notes (Signed)
 [place] EMERGENCY DEPARTMENT AT [facility] Provider Note   CSN: [id number] Arrival date & time: 05/23/24  0044     Patient presents with: Diarrhea and Pruritis   Dawn [name] is a 53 y.o. female.  Patient with past medical history significant for AIDS, schizophrenia, tactile hallucinations presents to the emergency room complaining of 2 separate complaints.  The patient complains of 2 weeks of diarrhea with no associated nausea, vomiting, or abdominal pain.  Patient also reports feeling like something may be biting her in her sleep and reports a possible rash which is very itchy.  She states that she does not have the rash at the current moment.  Patient denies chest pain, shortness of breath, fevers.    Diarrhea      Prior to Admission medications   Medication Sig Start Date End Date Taking? Authorizing Provider  cephALEXin  (KEFLEX ) 500 MG capsule Take 1 capsule (500 mg total) by mouth 4 (four) times daily. 05/05/24   Kammerer, Megan L, DO  darunavir -cobicistat  (PREZCOBIX ) 800-150 MG tablet TAKE ONE TABLET BY MOUTH DAILY WITH BREAKFAST *SWALLOW WHOLE. DO NOT CRUSH, BREAK OR CHEW TABLETS* 05/20/24   Luiz Channel, MD  doxycycline  (VIBRAMYCIN ) 100 MG capsule Take 1 capsule (100 mg total) by mouth 2 (two) times daily for 14 days. 05/20/24 06/03/24  Danny Geralds, DO  emtricitabine -tenofovir  AF (DESCOVY ) 200-25 MG tablet Take 1 tablet by mouth daily. 05/20/24   Luiz Channel, MD  ferrous sulfate  325 (65 FE) MG tablet Take 1 tablet (325 mg total) by mouth daily. 04/28/24 05/28/24  Roemhildt, Lorin T, PA-C  FLUoxetine  (PROZAC ) 20 MG capsule Take 1 capsule (20 mg total) by mouth daily. Patient not taking: Reported on 05/13/2024 03/15/23   Luiz Channel, MD  hydrochlorothiazide  (HYDRODIURIL ) 25 MG tablet Take 0.5 tablets (12.5 mg total) by mouth daily. 05/14/23   Luiz Channel, MD  hyoscyamine  (LEVSIN AMIEL) 0.125 MG SL tablet Place 1 tablet (0.125 mg total) under the tongue 4 (four)  times daily as needed for up to 5 days. 02/19/24 05/13/24  Trine Raynell Moder, MD  loperamide  (IMODIUM ) 2 MG capsule Take 1 capsule (2 mg total) by mouth 4 (four) times daily as needed for diarrhea or loose stools. 10/19/23   Horton, Charmaine FALCON, MD  megestrol  (MEGACE ) 40 MG tablet Take 2 tablets once daily for 4 days, then 1 tablet daily. 04/28/24   Roemhildt, Lorin T, PA-C  rosuvastatin  (CRESTOR ) 10 MG tablet Take 1 tablet (10 mg total) by mouth daily. 04/29/24   Luiz Channel, MD  valACYclovir  (VALTREX ) 1000 MG tablet Take 1 tablet (1,000 mg total) by mouth daily. Patient taking differently: Take 1,000 mg by mouth as needed. 01/23/24   Luiz Channel, MD  diphenhydrAMINE  (BENADRYL ) 25 MG tablet Take 1 tablet (25 mg total) by mouth every 6 (six) hours as needed for up to 3 days. 06/17/20 09/04/20  Woods, Jaclyn M, PA-C  famotidine  (PEPCID ) 20 MG tablet Take 1 tablet (20 mg total) by mouth 2 (two) times daily for 5 days. 06/17/20 09/04/20  Woods, Jaclyn M, PA-C    Allergies: Chocolate and Tomato    Review of Systems  Gastrointestinal:  Positive for diarrhea.    Updated Vital Signs BP 130/81   Pulse (!) 104   Temp 99.1 F (37.3 C)   Resp 15   Ht 5' 4 (1.626 m)   Wt 74 kg   LMP 05/01/2024   SpO2 100%   BMI 28.00 kg/m   Physical Exam Vitals and nursing  note reviewed.  Constitutional:      General: She is not in acute distress.    Appearance: She is well-developed.  HENT:     Head: Normocephalic and atraumatic.  Eyes:     Conjunctiva/sclera: Conjunctivae normal.  Cardiovascular:     Rate and Rhythm: Normal rate and regular rhythm.     Heart sounds: No murmur heard. Pulmonary:     Effort: Pulmonary effort is normal. No respiratory distress.     Breath sounds: Normal breath sounds.  Abdominal:     Palpations: Abdomen is soft.     Tenderness: There is no abdominal tenderness.  Musculoskeletal:        General: No swelling.     Cervical back: Neck supple.  Skin:    General: Skin  is warm and dry.     Capillary Refill: Capillary refill takes less than 2 seconds.  Neurological:     Mental Status: She is alert.  Psychiatric:        Mood and Affect: Mood normal.     (all labs ordered are listed, but only abnormal results are displayed) Labs Reviewed  CBC WITH DIFFERENTIAL/PLATELET - Abnormal; Notable for the following components:      Result Value   WBC 2.9 (*)    Hemoglobin 10.5 (*)    HCT 32.7 (*)    MCV 78.4 (*)    MCH 25.2 (*)    RDW 17.7 (*)    Neutro Abs 1.5 (*)    All other components within normal limits  COMPREHENSIVE METABOLIC PANEL WITH GFR - Abnormal; Notable for the following components:   Potassium 3.1 (*)    Creatinine, Ser 1.09 (*)    All other components within normal limits  LIPASE, BLOOD - Abnormal; Notable for the following components:   Lipase 87 (*)    All other components within normal limits  URINALYSIS, ROUTINE W REFLEX MICROSCOPIC - Abnormal; Notable for the following components:   Color, Urine AMBER (*)    APPearance HAZY (*)    Hgb urine dipstick MODERATE (*)    Protein, ur >=300 (*)    Bacteria, UA MANY (*)    Non Squamous Epithelial 0-5 (*)    All other components within normal limits    EKG: None  Radiology: No results found.   Procedures   Medications Ordered in the ED - No data to display                                  Medical Decision Making Amount and/or Complexity of Data Reviewed Labs: ordered.   This patient presents to the ED for concern of diarrhea and pruritus, this involves an extensive number of treatment options, and is a complaint that carries with it a high risk of complications and morbidity.  The differential diagnosis includes IBS, infectious diarrhea, appendicitis, colitis, gastritis, others.  Differential for the pruritus includes contact dermatitis, scabies, bedbugs, eczema, others   Co morbidities / Chronic conditions that complicate the patient evaluation  History of tactile  hallucinations, AIDS   Additional history obtained:  Additional history obtained from EMR Lab Tests:  I Ordered, and personally interpreted labs.  The pertinent results include: UA with many bacteria, 6-10 WBC, negative for leukocytes.  Hemoglobin appears to be at baseline.  No sign of significant dehydration.  Mildly elevated lipase in the setting of no abdominal pain   Imaging Studies ordered:  Patient with  no abdominal tenderness on examination.  She appears comfortable.  No signs of surgical/acute abdomen.  No indication for emergent abdominal imaging at this time   Social Determinants of Health:  Patient is a former smoker   Test / Admission - Considered:  Patient with no significant acute findings on workup today.  Her UA was equivocal for infection but she has no dysuria or urinary frequency.  UA did have some non-squamous epithelial cells concerning for possible contaminated sample.  Lipase mildly elevated of unknown significance with patient having no abdominal pain.  No rash.  She does believe there may be a new detergent and use, question allergic reaction.  I discussed using over-the-counter antihistamines with the patient as well as over-the-counter Imodium .  I have recommended the patient follow-up with her primary care team for further evaluation as needed.  The patient feels comfortable discharge home and I see no indication for further emergent workup or admission.  Patient stable for discharge at this time.      Final diagnoses:  Diarrhea, unspecified type  Pruritus    ED Discharge Orders     None          Logan Ubaldo KATHEE DEVONNA 05/23/24 0448    Trine Raynell Moder, MD 05/23/24 [phone]

## 2024-05-26 ENCOUNTER — Other Ambulatory Visit: Payer: Self-pay

## 2024-05-29 ENCOUNTER — Telehealth: Payer: Self-pay

## 2024-05-29 ENCOUNTER — Telehealth: Payer: Self-pay | Admitting: Gastroenterology

## 2024-05-29 NOTE — Telephone Encounter (Signed)
 Left message for patient to call back

## 2024-05-29 NOTE — Telephone Encounter (Signed)
 Patient was scheduled, she canceled due to she is not feeling well, want to know if she can get a call with her results.

## 2024-05-29 NOTE — Telephone Encounter (Signed)
 Inbound call from patient stating she wishes to cancel endoscopy and colonoscopy. States she does not want anasethia. Also states she believes she would be able to proceed with procedure due to loose stools. Patient requesting a call back. Please advise, thank you

## 2024-05-30 NOTE — Telephone Encounter (Signed)
 Left message for patient to call back

## 2024-06-02 ENCOUNTER — Other Ambulatory Visit (HOSPITAL_COMMUNITY): Payer: Self-pay

## 2024-06-02 ENCOUNTER — Other Ambulatory Visit: Payer: Self-pay

## 2024-06-02 ENCOUNTER — Telehealth: Payer: Self-pay | Admitting: Family Medicine

## 2024-06-02 MED ORDER — MEGESTROL ACETATE 40 MG PO TABS
ORAL_TABLET | ORAL | 0 refills | Status: AC
  Filled 2024-06-02: qty 34, 30d supply, fill #0

## 2024-06-02 MED ORDER — FERROUS SULFATE 325 (65 FE) MG PO TABS
325.0000 mg | ORAL_TABLET | Freq: Every day | ORAL | 0 refills | Status: AC
  Filled 2024-06-02: qty 30, 30d supply, fill #0

## 2024-06-02 NOTE — Telephone Encounter (Signed)
No return call received from patient. Will await further communication from patient.

## 2024-06-02 NOTE — Telephone Encounter (Signed)
 RN attempted to call pt to answer questions about results and prescriptions.  Per chart review, pt has not read Dr. Danny message regarding chronic endometritis and antibiotic sent to her pharmacy.  Left voicemail with office call back number.    [place], RN

## 2024-06-02 NOTE — Telephone Encounter (Signed)
 Patient is calling requesting a refill on megestrol  and iron  pills. Please send to [facility].

## 2024-06-02 NOTE — Addendum Note (Signed)
 Addended by: ELBY WADDELL CROME on: 06/02/2024 12:06 PM   Modules accepted: Orders

## 2024-06-03 ENCOUNTER — Ambulatory Visit

## 2024-06-04 ENCOUNTER — Encounter (HOSPITAL_COMMUNITY): Payer: Self-pay | Admitting: *Deleted

## 2024-06-04 ENCOUNTER — Emergency Department (HOSPITAL_COMMUNITY)
Admission: EM | Admit: 2024-06-04 | Discharge: 2024-06-05 | Disposition: A | Discharge status: Home / Self Care | Attending: Emergency Medicine | Admitting: Emergency Medicine

## 2024-06-04 ENCOUNTER — Other Ambulatory Visit: Payer: Self-pay

## 2024-06-04 DIAGNOSIS — R1084 Generalized abdominal pain: Secondary | ICD-10-CM | POA: Insufficient documentation

## 2024-06-04 DIAGNOSIS — E876 Hypokalemia: Secondary | ICD-10-CM | POA: Diagnosis not present

## 2024-06-04 DIAGNOSIS — Z21 Asymptomatic human immunodeficiency virus [HIV] infection status: Secondary | ICD-10-CM | POA: Diagnosis not present

## 2024-06-04 DIAGNOSIS — I10 Essential (primary) hypertension: Secondary | ICD-10-CM | POA: Diagnosis not present

## 2024-06-04 DIAGNOSIS — R197 Diarrhea, unspecified: Secondary | ICD-10-CM | POA: Insufficient documentation

## 2024-06-04 LAB — URINALYSIS, ROUTINE W REFLEX MICROSCOPIC
Bilirubin Urine: NEGATIVE
Glucose, UA: NEGATIVE mg/dL
Ketones, ur: NEGATIVE mg/dL
Leukocytes,Ua: NEGATIVE
Nitrite: NEGATIVE
Protein, ur: >=300 mg/dL — AB
Specific Gravity, Urine: 1.028 (ref 1.005–1.030)
pH: 6 (ref 5.0–8.0)

## 2024-06-04 LAB — COMPREHENSIVE METABOLIC PANEL WITH GFR
ALT: 17 U/L (ref 0–44)
AST: 27 U/L (ref 15–41)
Albumin: 3.2 g/dL — ABNORMAL LOW (ref 3.5–5.0)
Alkaline Phosphatase: 40 U/L (ref 38–126)
Anion gap: 14 (ref 5–15)
BUN: 10 mg/dL (ref 6–20)
CO2: 24 mmol/L (ref 22–32)
Calcium: 8.9 mg/dL (ref 8.9–10.3)
Chloride: 94 mmol/L — ABNORMAL LOW (ref 98–111)
Creatinine, Ser: 1.14 mg/dL — ABNORMAL HIGH (ref 0.44–1.00)
GFR, Estimated: 58 mL/min — ABNORMAL LOW (ref >60)
Glucose, Bld: 95 mg/dL (ref 70–99)
Potassium: 3.3 mmol/L — ABNORMAL LOW (ref 3.5–5.1)
Sodium: 132 mmol/L — ABNORMAL LOW (ref 135–145)
Total Bilirubin: 0.7 mg/dL (ref 0.0–1.2)
Total Protein: 7.6 g/dL (ref 6.5–8.1)

## 2024-06-04 LAB — CBC
HCT: 34.1 % — ABNORMAL LOW (ref 36.0–46.0)
Hemoglobin: 11.2 g/dL — ABNORMAL LOW (ref 12.0–15.0)
MCH: 24.9 pg — ABNORMAL LOW (ref 26.0–34.0)
MCHC: 32.8 g/dL (ref 30.0–36.0)
MCV: 75.9 fL — ABNORMAL LOW (ref 80.0–100.0)
Platelets: 300 K/uL (ref 150–400)
RBC: 4.49 MIL/uL (ref 3.87–5.11)
RDW: 16.5 % — ABNORMAL HIGH (ref 11.5–15.5)
WBC: 2 K/uL — ABNORMAL LOW (ref 4.0–10.5)
nRBC: 0 % (ref 0.0–0.2)

## 2024-06-04 LAB — LIPASE, BLOOD: Lipase: 208 U/L — ABNORMAL HIGH (ref 11–51)

## 2024-06-04 MED ORDER — POTASSIUM CHLORIDE 20 MEQ PO PACK: 40.0000 meq | PACK | Freq: Once | ORAL | Status: DC

## 2024-06-04 MED ORDER — LACTATED RINGERS IV BOLUS: 1000.0000 mL | Freq: Once | INTRAVENOUS | Status: DC

## 2024-06-04 NOTE — ED Provider Triage Note (Signed)
 Emergency Medicine Provider Triage Evaluation Note  Dawn [name] , a 53 y.o. female  was evaluated in triage.  Pt complains of ongoing diarrhea over the last month.  Up to 4 episodes per day and feeling generally unwell with fatigue, myalgias and mild diffuse abdominal discomfort.  Still compliant with medications but has not taken anything for the diarrhea.  Patient is still compliant with her HIV medications and last viral load was undetectable within normal T-cell count.  Review of Systems  Positive: Diarrhea, abdominal pain, lumps in her neck and head, general malaise and flulike symptoms Negative: Fever, shortness of breath or chest pain  Physical Exam  LMP 05/01/2024  Gen:   Awake, no distress   Resp:  Normal effort  MSK:   Moves extremities without difficulty  Other:  Mild diffuse abdominal pain but no localized symptoms  Medical Decision Making  Medically screening exam initiated at 2:27 PM.  Appropriate orders placed.  Dawn [name] was informed that the remainder of the evaluation will be completed by another provider, this initial triage assessment does not replace that evaluation, and the importance of remaining in the ED until their evaluation is complete.     Dawn Folks, MD 06/04/24 1430

## 2024-06-04 NOTE — ED Notes (Signed)
 Delay explained to pt and pt advised that stool sample has been ordered and to let us  know if she thinks she can go so we may help her get a sample .

## 2024-06-04 NOTE — ED Triage Notes (Signed)
 Pt is here by [facility] for evaluation of continued diarrhea x1 month.  Pt was seen for same at [facility] on 9/26.  Pt is HIV positive and states that she is taking antivirals as prescribed. Pt describes her diarrhea as watery.

## 2024-06-05 ENCOUNTER — Emergency Department (HOSPITAL_COMMUNITY)

## 2024-06-05 MED ORDER — IOHEXOL 350 MG/ML SOLN
75.0000 mL | Freq: Once | INTRAVENOUS | Status: AC | PRN
  Administered 2024-06-05: 75 mL via INTRAVENOUS

## 2024-06-05 MED ORDER — SODIUM CHLORIDE 0.9 % IV BOLUS
1000.0000 mL | Freq: Once | INTRAVENOUS | Status: AC
  Administered 2024-06-05: 1000 mL via INTRAVENOUS

## 2024-06-05 NOTE — ED Provider Notes (Signed)
 [place] EMERGENCY DEPARTMENT AT [facility] Provider Note   CSN: [id number] Arrival date & time: 06/04/24  1151     Patient presents with: Diarrhea   Dawn [name] is a 53 y.o. female.   The history is provided by the patient and medical records.  Diarrhea Associated symptoms: abdominal pain (cramping)    53 year old female with history of HIV, hypertension, schizophrenia, anxiety, depression, presenting to the ED for diarrhea.  Reports this has been ongoing for about 1 month now.  She reports 4+ episodes occurring daily.  Stools are loose and watery but nonbloody.  She reports a lot of abdominal cramping.  She denies any vomiting.  States whenever she tries to eat and drink everything just runs through her.  States she is feeling quite dehydrated, lips and mouth are very dry.  She does report she recently missed some of her HIV meds but has since resumed her therapies.  She denies any recent antibiotics use.  Reports fever at home, did have low grade fever here of 84F.  Denies any known sick contacts.  Prior to Admission medications   Medication Sig Start Date End Date Taking? Authorizing Provider  cephALEXin  (KEFLEX ) 500 MG capsule Take 1 capsule (500 mg total) by mouth 4 (four) times daily. 05/05/24   Kammerer, Megan L, DO  darunavir -cobicistat  (PREZCOBIX ) 800-150 MG tablet TAKE ONE TABLET BY MOUTH DAILY WITH BREAKFAST *SWALLOW WHOLE. DO NOT CRUSH, BREAK OR CHEW TABLETS* 05/20/24   Luiz Channel, MD  emtricitabine -tenofovir  AF (DESCOVY ) 200-25 MG tablet Take 1 tablet by mouth daily. 05/20/24   Luiz Channel, MD  ferrous sulfate  325 (65 FE) MG tablet Take 1 tablet (325 mg total) by mouth daily. 06/02/24 07/03/24  Fredirick Glenys RAMAN, MD  FLUoxetine  (PROZAC ) 20 MG capsule Take 1 capsule (20 mg total) by mouth daily. Patient not taking: Reported on 05/13/2024 03/15/23   Luiz Channel, MD  hydrochlorothiazide  (HYDRODIURIL ) 25 MG tablet Take 0.5 tablets (12.5 mg total) by mouth  daily. 05/14/23   Luiz Channel, MD  hyoscyamine  (LEVSIN AMIEL) 0.125 MG SL tablet Place 1 tablet (0.125 mg total) under the tongue 4 (four) times daily as needed for up to 5 days. 02/19/24 05/13/24  Trine Raynell Moder, MD  loperamide  (IMODIUM ) 2 MG capsule Take 1 capsule (2 mg total) by mouth 4 (four) times daily as needed for diarrhea or loose stools. 10/19/23   Horton, Charmaine FALCON, MD  megestrol  (MEGACE ) 40 MG tablet Take 2 tablets once daily for 4 days, then 1 tablet daily. 06/02/24   Fredirick Glenys RAMAN, MD  rosuvastatin  (CRESTOR ) 10 MG tablet Take 1 tablet (10 mg total) by mouth daily. 04/29/24   Luiz Channel, MD  valACYclovir  (VALTREX ) 1000 MG tablet Take 1 tablet (1,000 mg total) by mouth daily. Patient taking differently: Take 1,000 mg by mouth as needed. 01/23/24   Luiz Channel, MD  diphenhydrAMINE  (BENADRYL ) 25 MG tablet Take 1 tablet (25 mg total) by mouth every 6 (six) hours as needed for up to 3 days. 06/17/20 09/04/20  Woods, Jaclyn M, PA-C  famotidine  (PEPCID ) 20 MG tablet Take 1 tablet (20 mg total) by mouth 2 (two) times daily for 5 days. 06/17/20 09/04/20  Woods, Jaclyn M, PA-C    Allergies: Chocolate and Tomato    Review of Systems  Gastrointestinal:  Positive for abdominal pain (cramping) and diarrhea.  All other systems reviewed and are negative.   Updated Vital Signs BP 113/75 (BP Location: Left Arm)   Pulse (!) 116  Temp 99.2 F (37.3 C) (Oral)   Resp 19   LMP 05/01/2024   SpO2 100%   Physical Exam Vitals and nursing note reviewed.  Constitutional:      Appearance: She is well-developed.  HENT:     Head: Normocephalic and atraumatic.  Eyes:     Conjunctiva/sclera: Conjunctivae normal.     Pupils: Pupils are equal, round, and reactive to light.  Cardiovascular:     Rate and Rhythm: Normal rate and regular rhythm.     Heart sounds: Normal heart sounds.  Pulmonary:     Effort: Pulmonary effort is normal. No respiratory distress.     Breath sounds: Normal breath  sounds. No rhonchi.  Abdominal:     General: Bowel sounds are normal.     Palpations: Abdomen is soft.     Tenderness: There is no abdominal tenderness. There is no rebound.     Comments: Endorses generalized pain/cramping but no focal tenderness elicited, no peritoneal signs  Musculoskeletal:        General: Normal range of motion.     Cervical back: Normal range of motion.  Skin:    General: Skin is warm and dry.  Neurological:     Mental Status: She is alert and oriented to person, place, and time.     (all labs ordered are listed, but only abnormal results are displayed) Labs Reviewed  LIPASE, BLOOD - Abnormal; Notable for the following components:      Result Value   Lipase 208 (*)    All other components within normal limits  COMPREHENSIVE METABOLIC PANEL WITH GFR - Abnormal; Notable for the following components:   Sodium 132 (*)    Potassium 3.3 (*)    Chloride 94 (*)    Creatinine, Ser 1.14 (*)    Albumin 3.2 (*)    GFR, Estimated 58 (*)    All other components within normal limits  CBC - Abnormal; Notable for the following components:   WBC 2.0 (*)    Hemoglobin 11.2 (*)    HCT 34.1 (*)    MCV 75.9 (*)    MCH 24.9 (*)    RDW 16.5 (*)    All other components within normal limits  URINALYSIS, ROUTINE W REFLEX MICROSCOPIC - Abnormal; Notable for the following components:   Color, Urine AMBER (*)    APPearance HAZY (*)    Hgb urine dipstick MODERATE (*)    Protein, ur >=300 (*)    Bacteria, UA RARE (*)    All other components within normal limits  GASTROINTESTINAL PANEL BY PCR, STOOL (REPLACES STOOL CULTURE)    EKG: None  Radiology: CT ABDOMEN PELVIS W CONTRAST Result Date: 06/05/2024 CLINICAL DATA:  Diarrhea, abdominal pain EXAM: CT ABDOMEN AND PELVIS WITH CONTRAST TECHNIQUE: Multidetector CT imaging of the abdomen and pelvis was performed using the standard protocol following bolus administration of intravenous contrast. RADIATION DOSE REDUCTION: This exam  was performed according to the departmental dose-optimization program which includes automated exposure control, adjustment of the mA and/or kV according to patient size and/or use of iterative reconstruction technique. CONTRAST:  75mL OMNIPAQUE  IOHEXOL  350 MG/ML SOLN COMPARISON:  02/19/2024 FINDINGS: Lower chest: No acute abnormality. Hepatobiliary: No focal liver abnormality is seen. No gallstones, gallbladder wall thickening, or biliary dilatation. Pancreas: Unremarkable Spleen: Unremarkable Adrenals/Urinary Tract: Adrenal glands are unremarkable. Kidneys are normal, without renal calculi, focal lesion, or hydronephrosis. Bladder is unremarkable. Stomach/Bowel: Small umbilical hernia contains a single loop of unremarkable mid small bowel. The stomach,  small bowel, and large bowel are otherwise unremarkable. Appendix normal. No free intraperitoneal gas or fluid. Vascular/Lymphatic: No significant vascular findings are present. No enlarged abdominal or pelvic lymph nodes. Reproductive: Multiple enhancing masses are seen within the uterus in keeping with multiple uterine fibroids. Pelvic organs are otherwise unremarkable. Other: None significant Musculoskeletal: No acute or significant osseous findings. IMPRESSION: 1. No acute intra-abdominal pathology identified. No definite radiographic explanation for the patient's reported symptoms. 2. Small umbilical hernia containing a single loop of unremarkable mid small bowel. 3. Fibroid uterus. Electronically Signed   By: Dorethia Molt M.D.   On: 06/05/2024 04:38     Procedures   Medications Ordered in the ED  lactated ringers  bolus 1,000 mL (has no administration in time range)  potassium chloride  (KLOR-CON ) packet 40 mEq (has no administration in time range)  sodium chloride  0.9 % bolus 1,000 mL (has no administration in time range)                                    Medical Decision Making Amount and/or Complexity of Data Reviewed Labs:  ordered. Radiology: ordered and independent interpretation performed. ECG/medicine tests: ordered and independent interpretation performed.  Risk Prescription drug management.   53 year old female presenting to the ED with 1 month of diarrhea.  She reports 4+ episodes today.  Denies any bloody stools.  Does have history of HIV, has missed a few doses of her meds but has since resumed.  She denies any sick contacts.  She is afebrile and nontoxic in appearance here.  She is a little bit tachycardic but appears clinically dry.  Did have a low-grade fever earlier but normothermic now.  She is given additional IV fluids.  Labs here are grossly reassuring aside from mildly low potassium at 3.3.  She was already given oral supplementation.  Lipase is 208, however having more generalized pain and no peritoneal signs on exam.  Unclear significance of this.  CT obtained to assess for possible diverticulitis versus colitis versus other, no acute findings.  No pancreatitis.  Does have an umbilical hernia which is known.    Patient is feeling better after additional IV fluids.  She has been able to tolerate oral fluids.  She has been in the ED for a total of 18 hours and has not had any active diarrhea here.  Feel she is stable for discharge.  She expressed some concerns that her HIV meds may be causing her diarrhea, I recommended that she follow-up with her infectious disease physician about this.  Can return here for new concerns.  Final diagnoses:  Diarrhea, unspecified type    ED Discharge Orders     None          Jarold Olam HERO, PA-C 06/05/24 [id number]    Bari Charmaine FALCON, MD 06/06/24 [phone]

## 2024-06-05 NOTE — Discharge Instructions (Signed)
 CT scan looked ok today, no inflammation of the bowels or abdominal infection seen. I would continue all your usual meds for now. Call the infectious disease clinic to follow-up. Return here for new concerns.

## 2024-06-11 ENCOUNTER — Other Ambulatory Visit: Payer: Self-pay

## 2024-06-11 ENCOUNTER — Other Ambulatory Visit (HOSPITAL_COMMUNITY): Payer: Self-pay

## 2024-06-11 ENCOUNTER — Other Ambulatory Visit: Payer: Self-pay | Admitting: Internal Medicine

## 2024-06-11 DIAGNOSIS — R030 Elevated blood-pressure reading, without diagnosis of hypertension: Secondary | ICD-10-CM

## 2024-06-11 MED ORDER — HYDROCHLOROTHIAZIDE 25 MG PO TABS
12.5000 mg | ORAL_TABLET | Freq: Every day | ORAL | 5 refills | Status: AC
  Filled 2024-06-11: qty 30, 60d supply, fill #0
  Filled 2024-08-27: qty 30, 60d supply, fill #1
  Filled 2024-09-23: qty 30, 60d supply, fill #2

## 2024-06-11 MED ORDER — ROSUVASTATIN CALCIUM 10 MG PO TABS
10.0000 mg | ORAL_TABLET | Freq: Every day | ORAL | 5 refills | Status: AC
  Filled 2024-06-11: qty 30, 30d supply, fill #0
  Filled 2024-08-27: qty 30, 30d supply, fill #1
  Filled 2024-09-23: qty 30, 30d supply, fill #2

## 2024-06-11 NOTE — Telephone Encounter (Signed)
 Open in error

## 2024-06-11 NOTE — Progress Notes (Signed)
 Specialty Pharmacy Refill Coordination Note  Dawn [name] is a 53 y.o. female contacted today regarding refills of specialty medication(s) Darunavir -Cobicistat  (Prezcobix ); Emtricitabine -Tenofovir  AF (Descovy )   Patient requested Delivery   Delivery date: 06/13/24   Verified address: [address]  [address] [place] [address]   Medication will be filled on 06/12/24.

## 2024-06-12 ENCOUNTER — Other Ambulatory Visit: Payer: Self-pay

## 2024-06-19 ENCOUNTER — Encounter: Admitting: Gastroenterology

## 2024-06-25 ENCOUNTER — Other Ambulatory Visit: Payer: Self-pay

## 2024-06-25 ENCOUNTER — Encounter: Payer: Self-pay | Admitting: Internal Medicine

## 2024-06-25 ENCOUNTER — Ambulatory Visit (INDEPENDENT_AMBULATORY_CARE_PROVIDER_SITE_OTHER): Admitting: Internal Medicine

## 2024-06-25 VITALS — BP 152/69 | HR 97 | Temp 97.9°F | Wt 147.0 lb

## 2024-06-25 DIAGNOSIS — B20 Human immunodeficiency virus [HIV] disease: Secondary | ICD-10-CM | POA: Diagnosis not present

## 2024-06-25 DIAGNOSIS — E876 Hypokalemia: Secondary | ICD-10-CM | POA: Diagnosis not present

## 2024-06-25 DIAGNOSIS — Z23 Encounter for immunization: Secondary | ICD-10-CM

## 2024-06-25 DIAGNOSIS — Z0000 Encounter for general adult medical examination without abnormal findings: Secondary | ICD-10-CM

## 2024-06-25 NOTE — Progress Notes (Signed)
 Patient ID: Dawn [name], female   DOB: 1971/07/16, 53 y.o.   MRN: [id number]  HPI Dawn [name] is a 53yo F with hiv disease, on descovy -prezcobix , CD 4 count 875/VL<20 in August 2025. She reports taking her medications daily, except when she was recently ill. She went to the ED this month for dehydration from GI illness, possibly food-borne-- had numerous abnormalities on CBC and BMP. She reports that she is no longer having diarrhea. Appetite improving. Returning back to her baseline health  Ros: + goiter, at times notices that she has phlegm but no difficulty with swallowing foods/liquids  Outpatient Encounter Medications as of 06/25/2024  Medication Sig   darunavir -cobicistat  (PREZCOBIX ) 800-150 MG tablet TAKE ONE TABLET BY MOUTH DAILY WITH BREAKFAST *SWALLOW WHOLE. DO NOT CRUSH, BREAK OR CHEW TABLETS*   emtricitabine -tenofovir  AF (DESCOVY ) 200-25 MG tablet Take 1 tablet by mouth daily.   cephALEXin  (KEFLEX ) 500 MG capsule Take 1 capsule (500 mg total) by mouth 4 (four) times daily. (Patient not taking: Reported on 06/25/2024)   ferrous sulfate  325 (65 FE) MG tablet Take 1 tablet (325 mg total) by mouth daily.   FLUoxetine  (PROZAC ) 20 MG capsule Take 1 capsule (20 mg total) by mouth daily. (Patient not taking: Reported on 05/13/2024)   hydrochlorothiazide  (HYDRODIURIL ) 25 MG tablet Take 0.5 tablets (12.5 mg total) by mouth daily.   hyoscyamine  (LEVSIN /SL) 0.125 MG SL tablet Place 1 tablet (0.125 mg total) under the tongue 4 (four) times daily as needed for up to 5 days.   loperamide  (IMODIUM ) 2 MG capsule Take 1 capsule (2 mg total) by mouth 4 (four) times daily as needed for diarrhea or loose stools.   megestrol  (MEGACE ) 40 MG tablet Take 2 tablets once daily for 4 days, then 1 tablet daily.   rosuvastatin  (CRESTOR ) 10 MG tablet Take 1 tablet (10 mg total) by mouth daily.   valACYclovir  (VALTREX ) 1000 MG tablet Take 1 tablet (1,000 mg total) by mouth daily. (Patient taking differently:  Take 1,000 mg by mouth as needed.)   [DISCONTINUED] diphenhydrAMINE  (BENADRYL ) 25 MG tablet Take 1 tablet (25 mg total) by mouth every 6 (six) hours as needed for up to 3 days.   [DISCONTINUED] famotidine  (PEPCID ) 20 MG tablet Take 1 tablet (20 mg total) by mouth 2 (two) times daily for 5 days.   No facility-administered encounter medications on file as of 06/25/2024.     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   Vaginal discharge 08/14/2022   Enlarged thyroid  gland 01/30/2022   Fibroids 07/12/2021   Need for prophylactic vaccination and inoculation against influenza 07/12/2021   Viral illness 01/08/2019   HIV disease (HCC) 03/05/2017   HTN (hypertension) 03/05/2017   Anemia 03/13/2016   Anemia due to chronic blood loss 03/13/2016   Acquired immune deficiency syndrome (HCC) 12/07/2015   Acne vulgaris 12/07/2015   Mood disorder 12/07/2015     Health Maintenance Due  Topic Date Due   Hepatitis B Vaccines 19-59 Average Risk (1 of 3 - 19+ 3-dose series) Never done   Zoster Vaccines- Shingrix (1 of 2) Never done   Colonoscopy  Never done   Mammogram  10/08/2021   Pneumococcal Vaccine: 50+ Years (3 of 3 - PCV20 or PCV21) 11/09/2021   COVID-19 Vaccine (3 - Pfizer risk series) 07/20/2022   Influenza Vaccine  03/28/2024     Review of Systems 12 point ros is  negative except what is mentioned above Physical Exam   BP (!) 152/69   Pulse 97   Temp 97.9 F (36.6 C) (Temporal)   Wt 147 lb (66.7 kg)   SpO2 97%   BMI 25.23 kg/m   Physical Exam  Constitutional:  oriented to person, place, and time. appears well-developed and well-nourished. No distress.  HENT: [place]/AT, PERRLA, no scleral icterus Mouth/Throat: Oropharynx is clear and moist. No oropharyngeal exudate.  Cardiovascular: Normal rate, regular rhythm and normal heart sounds. Exam reveals no gallop and no friction  rub.  No murmur heard.  Pulmonary/Chest: Effort normal and breath sounds normal. No respiratory distress.  has no wheezes.  Neck = supple, no nuchal rigidity, + goiter Abdominal: Soft. Bowel sounds are normal.  exhibits no distension. There is no tenderness.  Lymphadenopathy: no cervical adenopathy. No axillary adenopathy Neurological: alert and oriented to person, place, and time.  Skin: Skin is warm and dry. No rash noted. No erythema.  Psychiatric: a normal mood and affect.  behavior is normal.   Lab Results  Component Value Date   CD4TCELL 45 04/25/2024   Lab Results  Component Value Date   CD4TABS 412 11/28/2023   CD4TABS 541 08/15/2023   CD4TABS 705 05/14/2023   Lab Results  Component Value Date   HIV1RNAQUANT NOT DETECTED 04/25/2024   Lab Results  Component Value Date   HEPBSAB POS (A) 12/09/2015   Lab Results  Component Value Date   LABRPR NON-REACTIVE 04/25/2024    CBC Lab Results  Component Value Date   WBC 2.0 (L) 06/04/2024   RBC 4.49 06/04/2024   HGB 11.2 (L) 06/04/2024   HCT 34.1 (L) 06/04/2024   PLT 300 06/04/2024   MCV 75.9 (L) 06/04/2024   MCH 24.9 (L) 06/04/2024   MCHC 32.8 06/04/2024   RDW 16.5 (H) 06/04/2024   LYMPHSABS 1.1 05/23/2024   MONOABS 0.3 05/23/2024   EOSABS 0.0 05/23/2024    BMET Lab Results  Component Value Date   NA 132 (L) 06/04/2024   K 3.3 (L) 06/04/2024   CL 94 (L) 06/04/2024   CO2 24 06/04/2024   GLUCOSE 95 06/04/2024   BUN 10 06/04/2024   CREATININE 1.14 (H) 06/04/2024   CALCIUM  8.9 06/04/2024   GFRNONAA 58 (L) 06/04/2024   GFRAA 87 02/02/2021      Assessment and Plan HIV disease= will check HIV VL, due to hx of non adherence  Long term medication management = no signs of new drug interaction. Will check cr and cbc to see that they are stable on her current regimen  Diarrhea = resolved  Hypokalemia = likely from diarrhea. 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 Continue to monitor to see if needs further surgical vs. Radioiodine therapy. Plan to repeat imaging in spring 2026.  Has poor health literacy, spent time reviewing results and studies  I personally spent a total of 41 minutes in the care of the patient today including preparing to see the patient, getting/reviewing separately obtained history, performing a medically appropriate exam/evaluation, counseling and educating, placing orders, documenting clinical information in the EHR, independently interpreting results, and communicating results.

## 2024-06-26 NOTE — Telephone Encounter (Signed)
 Patient returned call & does not wish to reschedule colonoscopy d/t concern for anesthesia and lack of transportation. Stool kit not completed either. She doesn't wish to do this at this time. She's been advised to give us  a call back if/when she decides to reschedule or if she has any further questions/concerns.

## 2024-06-27 LAB — COMPLETE METABOLIC PANEL WITHOUT GFR
AG Ratio: 1.1 (calc) (ref 1.0–2.5)
ALT: 16 U/L (ref 6–29)
AST: 18 U/L (ref 10–35)
Albumin: 3.9 g/dL (ref 3.6–5.1)
Alkaline phosphatase (APISO): 43 U/L (ref 37–153)
BUN: 12 mg/dL (ref 7–25)
CO2: 27 mmol/L (ref 20–32)
Calcium: 9.2 mg/dL (ref 8.6–10.4)
Chloride: 104 mmol/L (ref 98–110)
Creat: 0.81 mg/dL (ref 0.50–1.03)
Globulin: 3.4 g/dL (ref 1.9–3.7)
Glucose, Bld: 86 mg/dL (ref 65–99)
Potassium: 3.2 mmol/L — ABNORMAL LOW (ref 3.5–5.3)
Sodium: 141 mmol/L (ref 135–146)
Total Bilirubin: 0.6 mg/dL (ref 0.2–1.2)
Total Protein: 7.3 g/dL (ref 6.1–8.1)

## 2024-06-27 LAB — LIPID PANEL
Cholesterol: 173 mg/dL (ref <200)
HDL: 40 mg/dL — ABNORMAL LOW (ref >=50)
LDL Cholesterol (Calc): 115 mg/dL — ABNORMAL HIGH
Non-HDL Cholesterol (Calc): 133 mg/dL — ABNORMAL HIGH (ref <130)
Total CHOL/HDL Ratio: 4.3 (calc) (ref <5.0)
Triglycerides: 84 mg/dL (ref <150)

## 2024-06-27 LAB — CBC WITH DIFFERENTIAL/PLATELET
Absolute Lymphocytes: 969 {cells}/uL (ref 850–3900)
Absolute Monocytes: 327 {cells}/uL (ref 200–950)
Basophils Absolute: 9 {cells}/uL (ref 0–200)
Basophils Relative: 0.3 %
Eosinophils Absolute: 9 {cells}/uL — ABNORMAL LOW (ref 15–500)
Eosinophils Relative: 0.3 %
HCT: 31.4 % — ABNORMAL LOW (ref 35.0–45.0)
Hemoglobin: 9.5 g/dL — ABNORMAL LOW (ref 11.7–15.5)
MCH: 25.1 pg — ABNORMAL LOW (ref 27.0–33.0)
MCHC: 30.3 g/dL — ABNORMAL LOW (ref 32.0–36.0)
MCV: 82.8 fL (ref 80.0–100.0)
MPV: 11.1 fL (ref 7.5–12.5)
Monocytes Relative: 10.9 %
Neutro Abs: 1686 {cells}/uL (ref 1500–7800)
Neutrophils Relative %: 56.2 %
Platelets: 316 Thousand/uL (ref 140–400)
RBC: 3.79 Million/uL — ABNORMAL LOW (ref 3.80–5.10)
RDW: 17.3 % — ABNORMAL HIGH (ref 11.0–15.0)
Total Lymphocyte: 32.3 %
WBC: 3 Thousand/uL — ABNORMAL LOW (ref 3.8–10.8)

## 2024-06-27 LAB — HIV-1 RNA QUANT-NO REFLEX-BLD
HIV 1 RNA Quant: <20 {copies}/mL — AB
HIV-1 RNA Quant, Log: <1.3 {Log_copies}/mL — AB

## 2024-07-03 ENCOUNTER — Other Ambulatory Visit (HOSPITAL_COMMUNITY): Payer: Self-pay

## 2024-07-07 ENCOUNTER — Other Ambulatory Visit: Payer: Self-pay

## 2024-07-09 ENCOUNTER — Other Ambulatory Visit: Payer: Self-pay

## 2024-07-11 ENCOUNTER — Other Ambulatory Visit: Payer: Self-pay

## 2024-07-14 ENCOUNTER — Other Ambulatory Visit: Payer: Self-pay

## 2024-07-14 ENCOUNTER — Other Ambulatory Visit (HOSPITAL_COMMUNITY): Payer: Self-pay

## 2024-07-14 NOTE — Progress Notes (Signed)
 Specialty Pharmacy Refill Coordination Note  Spoke with [place]  Dawn [name] is a 53 y.o. female contacted today regarding refills of specialty medication(s) Darunavir -Cobicistat  (Prezcobix ); Emtricitabine -Tenofovir  AF (Descovy )  Doses on hand: 6  Patient requested: Delivery   Delivery date: 07/16/24   Verified address: [address] [place] [address]  Medication will be filled on 07/15/24 .

## 2024-07-30 ENCOUNTER — Telehealth: Payer: Self-pay

## 2024-07-30 ENCOUNTER — Other Ambulatory Visit (HOSPITAL_COMMUNITY): Payer: Self-pay

## 2024-07-30 NOTE — Telephone Encounter (Signed)
 Pharmacy Patient Advocate Encounter  Received notification from [employer] that Prior Authorization for DESCOVY  has been CANCELLED due to PA NOT NEEDED  PA #/Case ID/Reference #: [id number]

## 2024-07-30 NOTE — Telephone Encounter (Signed)
 Pharmacy Patient Advocate Encounter   Received notification from [employer] that prior authorization for DESCOVY  is required/requested.   Insurance verification completed.   The patient is insured through [employer].   Per test claim: PA required; PA submitted to above mentioned insurance via [employer] Key/confirmation #/EOC [id number] Status is pending

## 2024-08-06 ENCOUNTER — Other Ambulatory Visit: Payer: Self-pay

## 2024-08-06 ENCOUNTER — Other Ambulatory Visit: Payer: Self-pay | Admitting: Family Medicine

## 2024-08-06 ENCOUNTER — Other Ambulatory Visit (HOSPITAL_COMMUNITY): Payer: Self-pay

## 2024-08-06 NOTE — Progress Notes (Signed)
 Specialty Pharmacy Refill Coordination Note  Spoke with [place]  Dawn [name] is a 53 y.o. female contacted today regarding refills of specialty medication(s) Darunavir -Cobicistat  (Prezcobix ); Emtricitabine -Tenofovir  AF (Descovy )  Doses on hand: 13  Patient requested: Delivery   Delivery date: 08/15/24   Verified address: [address] [place] [address]  Medication will be filled on 08/14/24

## 2024-08-27 ENCOUNTER — Other Ambulatory Visit: Payer: Self-pay

## 2024-08-27 ENCOUNTER — Other Ambulatory Visit (HOSPITAL_COMMUNITY): Payer: Self-pay

## 2024-08-27 NOTE — Progress Notes (Signed)
 Patient called pharmacy and reported they will run out of medication in 3 days. CE tracking shows med was delivered on 12.19.25, confirmed with patient delivery photo matched her home and advised patient check again for most recent delivery. Patient called back and confirmed that she does have most recent delivery.

## 2024-09-17 ENCOUNTER — Other Ambulatory Visit: Payer: Self-pay

## 2024-09-17 ENCOUNTER — Telehealth: Payer: Self-pay

## 2024-09-17 DIAGNOSIS — F99 Mental disorder, not otherwise specified: Secondary | ICD-10-CM

## 2024-09-17 NOTE — Progress Notes (Signed)
 Complex Care Management Note Care Guide Note  09/17/2024 Name: Dawn [name] MRN: [id number] DOB: [birth year]   Complex Care Management Outreach Attempts: An unsuccessful telephone outreach was attempted today to offer the patient information about available complex care management services.  Follow Up Plan:  Additional outreach attempts will be made to offer the patient complex care management information and services.   Encounter Outcome:  No Answer  [name]  [facility], [facility] VBCI Assistant Direct Dial: [phone]  Fax: [phone]

## 2024-09-18 ENCOUNTER — Other Ambulatory Visit: Payer: Self-pay

## 2024-09-18 ENCOUNTER — Other Ambulatory Visit (HOSPITAL_COMMUNITY): Payer: Self-pay

## 2024-09-18 NOTE — Progress Notes (Signed)
 Specialty Pharmacy Refill Coordination Note  Dawn [name] is a 54 y.o. female contacted today regarding refills of specialty medication(s) Darunavir -Cobicistat  (Prezcobix ); Emtricitabine -Tenofovir  AF (Descovy )   Patient requested Delivery   Delivery date: 09/19/24   Verified address: [address] [name] [address]   Medication will be filled on: 09/18/24

## 2024-09-19 NOTE — Progress Notes (Signed)
 Complex Care Management Note Care Guide Note  09/19/2024 Name: Dawn [name] MRN: [id number] DOB: [birth year]   Complex Care Management Outreach Attempts: A second unsuccessful outreach was attempted today to offer the patient with information about available complex care management services.  Follow Up Plan:  Additional outreach attempts will be made to offer the patient complex care management information and services.   Encounter Outcome:  No Answer  [name]  [facility], [facility] VBCI Assistant Direct Dial: [phone]  Fax: [phone]

## 2024-09-22 NOTE — Progress Notes (Signed)
 Complex Care Management Note Care Guide Note  09/22/2024 Name: Dawn [name] MRN: [id number] DOB: Mar 31, 1971   Complex Care Management Outreach Attempts: A third unsuccessful outreach was attempted today to offer the patient with information about available complex care management services.  Follow Up Plan:  No further outreach attempts will be made at this time. We have been unable to contact the patient to offer or enroll patient in complex care management services.  Encounter Outcome:  No Answer  [name]  [facility], [facility] VBCI Assistant Direct Dial: [phone]  Fax: [phone]

## 2024-09-23 ENCOUNTER — Other Ambulatory Visit (HOSPITAL_COMMUNITY): Payer: Self-pay

## 2024-09-24 ENCOUNTER — Encounter: Payer: Self-pay | Admitting: Internal Medicine

## 2024-09-24 ENCOUNTER — Other Ambulatory Visit: Payer: Self-pay

## 2024-09-24 ENCOUNTER — Ambulatory Visit: Admitting: Internal Medicine

## 2024-09-24 VITALS — BP 123/84 | HR 83 | Temp 98.5°F | Wt 151.0 lb

## 2024-09-24 DIAGNOSIS — I10 Essential (primary) hypertension: Secondary | ICD-10-CM | POA: Diagnosis not present

## 2024-09-24 DIAGNOSIS — B20 Human immunodeficiency virus [HIV] disease: Secondary | ICD-10-CM | POA: Diagnosis present

## 2024-09-24 DIAGNOSIS — B009 Herpesviral infection, unspecified: Secondary | ICD-10-CM | POA: Diagnosis not present

## 2024-09-24 DIAGNOSIS — Z79899 Other long term (current) drug therapy: Secondary | ICD-10-CM

## 2024-09-24 DIAGNOSIS — B3731 Acute candidiasis of vulva and vagina: Secondary | ICD-10-CM

## 2024-09-24 MED ORDER — VALACYCLOVIR HCL 1 G PO TABS
1000.0000 mg | ORAL_TABLET | Freq: Two times a day (BID) | ORAL | 1 refills | Status: AC
  Filled 2024-09-24: qty 20, 10d supply, fill #0

## 2024-09-24 MED ORDER — DESCOVY 200-25 MG PO TABS
1.0000 | ORAL_TABLET | Freq: Every day | ORAL | 5 refills | Status: AC
  Filled 2024-09-24: qty 30, 30d supply, fill #0

## 2024-09-24 MED ORDER — PREZCOBIX 800-150 MG PO TABS
ORAL_TABLET | ORAL | 5 refills | Status: AC
  Filled 2024-09-24: qty 30, fill #0

## 2024-09-24 MED ORDER — FLUCONAZOLE 150 MG PO TABS
150.0000 mg | ORAL_TABLET | Freq: Every day | ORAL | 0 refills | Status: AC
  Filled 2024-09-24: qty 5, 15d supply, fill #0

## 2024-09-24 NOTE — Progress Notes (Signed)
 "    RFV: follow up for hiv disease  Patient ID: Dawn [name], female   DOB: November 19, 1970, 54 y.o.   MRN: [id number]  HPI Dawn [name] is a 54yo F with hiv disease on descovy -prezcobix , CD 4 count 575/VL<20 ( Oct 2026), doing well on adherence.   Reports having vaginal itching.  She hasn't had recent sexual activity; last pap June 2025  She reports that she had tubal ligation in [place] but following up with ob/gyn locally. No vaginal discharge. No pelvic pain, nor fever chills, flank pain. No dysuria  Outpatient Encounter Medications as of 09/24/2024  Medication Sig   cephALEXin  (KEFLEX ) 500 MG capsule Take 1 capsule (500 mg total) by mouth 4 (four) times daily.   darunavir -cobicistat  (PREZCOBIX ) 800-150 MG tablet TAKE ONE TABLET BY MOUTH DAILY WITH BREAKFAST *SWALLOW WHOLE. DO NOT CRUSH, BREAK OR CHEW TABLETS*   emtricitabine -tenofovir  AF (DESCOVY ) 200-25 MG tablet Take 1 tablet by mouth daily.   FLUoxetine  (PROZAC ) 20 MG capsule Take 1 capsule (20 mg total) by mouth daily.   hydrochlorothiazide  (HYDRODIURIL ) 25 MG tablet Take 0.5 tablets (12.5 mg total) by mouth daily.   loperamide  (IMODIUM ) 2 MG capsule Take 1 capsule (2 mg total) by mouth 4 (four) times daily as needed for diarrhea or loose stools.   megestrol  (MEGACE ) 40 MG tablet Take 2 tablets once daily for 4 days, then 1 tablet daily.   rosuvastatin  (CRESTOR ) 10 MG tablet Take 1 tablet (10 mg total) by mouth daily.   valACYclovir  (VALTREX ) 1000 MG tablet Take 1 tablet (1,000 mg total) by mouth daily. (Patient taking differently: Take 1,000 mg by mouth as needed.)   ferrous sulfate  325 (65 FE) MG tablet Take 1 tablet (325 mg total) by mouth daily.   hyoscyamine  (LEVSIN /SL) 0.125 MG SL tablet Place 1 tablet (0.125 mg total) under the tongue 4 (four) times daily as needed for up to 5 days.   [DISCONTINUED] diphenhydrAMINE  (BENADRYL ) 25 MG tablet Take 1 tablet (25 mg total) by mouth every 6 (six) hours as needed for up to 3 days.    [DISCONTINUED] famotidine  (PEPCID ) 20 MG tablet Take 1 tablet (20 mg total) by mouth 2 (two) times daily for 5 days.   No facility-administered encounter medications on file as of 09/24/2024.     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   Vaginal discharge 08/14/2022   Enlarged thyroid  gland 01/30/2022   Fibroids 07/12/2021   Need for prophylactic vaccination and inoculation against influenza 07/12/2021   Viral illness 01/08/2019   HIV disease (HCC) 03/05/2017   HTN (hypertension) 03/05/2017   Anemia 03/13/2016   Anemia due to chronic blood loss 03/13/2016   Acquired immune deficiency syndrome (HCC) 12/07/2015   Acne vulgaris 12/07/2015   Mood disorder 12/07/2015     Health Maintenance Due  Topic Date Due   Hepatitis B Vaccines 19-59 Average Risk (1 of 3 - 19+ 3-dose series) Never done   Zoster Vaccines- Shingrix (1 of 2) Never done   Colonoscopy  Never done   Mammogram  10/08/2021   COVID-19 Vaccine (3 - Pfizer risk series) 07/20/2022     Review of Systems 12 point ros is negative except what is mentioned above Physical Exam   BP (!) 150/88   Pulse 83   Temp 98.5 F (36.9 C) (Oral)   Wt 151 lb (68.5 kg)   SpO2 99%   BMI 25.92  kg/m   Physical Exam  Constitutional:  oriented to person, place, and time. appears well-developed and well-nourished. No distress.  HENT: /AT, PERRLA, no scleral icterus Mouth/Throat: Oropharynx is clear and moist. No oropharyngeal exudate.  Cardiovascular: Normal rate, regular rhythm and normal heart sounds. Exam reveals no gallop and no friction rub.  No murmur heard.  Pulmonary/Chest: Effort normal and breath sounds normal. No respiratory distress.  has no wheezes.  Neck = supple, no nuchal rigidity Abdominal: Soft. Bowel sounds are normal.  exhibits no distension. There is no tenderness.  Lymphadenopathy: no cervical  adenopathy. No axillary adenopathy Gu= small cluster vesicular lesions noted on mons pubis Neurological: alert and oriented to person, place, and time.  Skin: Skin is warm and dry. No rash noted. No erythema.  Psychiatric: a normal mood and affect.  behavior is normal.   Lab Results  Component Value Date   CD4TCELL 45 04/25/2024   Lab Results  Component Value Date   CD4TABS 412 11/28/2023   CD4TABS 541 08/15/2023   CD4TABS 705 05/14/2023   Lab Results  Component Value Date   HIV1RNAQUANT <20 DETECTED (A) 06/25/2024   Lab Results  Component Value Date   HEPBSAB POS (A) 12/09/2015   Lab Results  Component Value Date   LABRPR NON-REACTIVE 04/25/2024    CBC Lab Results  Component Value Date   WBC 3.0 (L) 06/25/2024   RBC 3.79 (L) 06/25/2024   HGB 9.5 (L) 06/25/2024   HCT 31.4 (L) 06/25/2024   PLT 316 06/25/2024   MCV 82.8 06/25/2024   MCH 25.1 (L) 06/25/2024   MCHC 30.3 (L) 06/25/2024   RDW 17.3 (H) 06/25/2024   LYMPHSABS 1.1 05/23/2024   MONOABS 0.3 05/23/2024   EOSABS 9 (L) 06/25/2024    BMET Lab Results  Component Value Date   NA 141 06/25/2024   K 3.2 (L) 06/25/2024   CL 104 06/25/2024   CO2 27 06/25/2024   GLUCOSE 86 06/25/2024   BUN 12 06/25/2024   CREATININE 0.81 06/25/2024   CALCIUM  9.2 06/25/2024   GFRNONAA 58 (L) 06/04/2024   GFRAA 87 02/02/2021      Assessment and Plan  Hiv disease = will check labs and refill meds  Long term medication = will chec kcr  Vaginal candidiasis = will do a trial of fluconazole   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 Repeat BP is WNL  I personally spent a total of 50 minutes in the care of the patient today including preparing to see the patient, getting/reviewing separately obtained history, performing a medically appropriate exam/evaluation, counseling and educating, placing orders, documenting  clinical information in the EHR, independently interpreting results, and communicating results.    "

## 2024-09-25 ENCOUNTER — Other Ambulatory Visit (HOSPITAL_COMMUNITY): Payer: Self-pay

## 2024-09-25 ENCOUNTER — Telehealth: Payer: Self-pay | Admitting: Internal Medicine

## 2024-09-25 LAB — T-HELPER CELL (CD4) - (RCID CLINIC ONLY)
CD4 % Helper T Cell: 45 % (ref 33–65)
CD4 T Cell Abs: 310 /uL — ABNORMAL LOW (ref 400–1790)

## 2024-09-25 NOTE — Telephone Encounter (Signed)
 Rpr still pending.

## 2024-09-25 NOTE — Telephone Encounter (Signed)
 Dawn [name] requesting a call from Dr. Luiz regarding RPR results. She also stated she completed a pap smear 05/13/24. Pt can be reached at [phone].

## 2024-09-26 LAB — HIV-1 RNA QUANT-NO REFLEX-BLD
HIV 1 RNA Quant: NOT DETECTED {copies}/mL
HIV-1 RNA Quant, Log: NOT DETECTED {Log_copies}/mL

## 2024-09-26 LAB — SYPHILIS: RPR W/REFLEX TO RPR TITER AND TREPONEMAL ANTIBODIES, TRADITIONAL SCREENING AND DIAGNOSIS ALGORITHM: RPR Ser Ql: NONREACTIVE

## 2024-09-26 NOTE — Telephone Encounter (Signed)
 Called [facility], reviewed that RPR is a test to look for syphilis and that her test was negative, indicating she does not have syphilis. Patient verbalized understanding and has no further questions.   Xabi Wittler, BSN, RN

## 2024-12-18 ENCOUNTER — Ambulatory Visit: Payer: Self-pay | Admitting: Internal Medicine
